# Patient Record
Sex: Male | Born: 1940
Health system: Southern US, Community
[De-identification: ages and names within clinical notes are randomized; demographics above are authoritative.]

## PROBLEM LIST (undated history)

## (undated) DIAGNOSIS — M1990 Unspecified osteoarthritis, unspecified site: Secondary | ICD-10-CM

## (undated) DIAGNOSIS — Z8719 Personal history of other diseases of the digestive system: Secondary | ICD-10-CM

## (undated) DIAGNOSIS — R066 Hiccough: Secondary | ICD-10-CM

## (undated) DIAGNOSIS — N401 Enlarged prostate with lower urinary tract symptoms: Secondary | ICD-10-CM

## (undated) DIAGNOSIS — F1910 Other psychoactive substance abuse, uncomplicated: Secondary | ICD-10-CM

## (undated) DIAGNOSIS — E785 Hyperlipidemia, unspecified: Secondary | ICD-10-CM

## (undated) DIAGNOSIS — C61 Malignant neoplasm of prostate: Secondary | ICD-10-CM

## (undated) DIAGNOSIS — K449 Diaphragmatic hernia without obstruction or gangrene: Secondary | ICD-10-CM

## (undated) DIAGNOSIS — N179 Acute kidney failure, unspecified: Secondary | ICD-10-CM

## (undated) DIAGNOSIS — G3184 Mild cognitive impairment, so stated: Secondary | ICD-10-CM

## (undated) DIAGNOSIS — Z789 Other specified health status: Secondary | ICD-10-CM

## (undated) DIAGNOSIS — I10 Essential (primary) hypertension: Secondary | ICD-10-CM

## (undated) DIAGNOSIS — K222 Esophageal obstruction: Secondary | ICD-10-CM

## (undated) DIAGNOSIS — F1011 Alcohol abuse, in remission: Secondary | ICD-10-CM

## (undated) DIAGNOSIS — R569 Unspecified convulsions: Secondary | ICD-10-CM

## (undated) DIAGNOSIS — I639 Cerebral infarction, unspecified: Secondary | ICD-10-CM

## (undated) DIAGNOSIS — K219 Gastro-esophageal reflux disease without esophagitis: Secondary | ICD-10-CM

## (undated) DIAGNOSIS — I444 Left anterior fascicular block: Secondary | ICD-10-CM

## (undated) DIAGNOSIS — Z8673 Personal history of transient ischemic attack (TIA), and cerebral infarction without residual deficits: Secondary | ICD-10-CM

## (undated) DIAGNOSIS — I723 Aneurysm of iliac artery: Secondary | ICD-10-CM

## (undated) HISTORY — DX: Other psychoactive substance abuse, uncomplicated: F19.10

## (undated) HISTORY — DX: Essential (primary) hypertension: I10

## (undated) HISTORY — DX: Hiccough: R06.6

## (undated) HISTORY — DX: Cerebral infarction, unspecified: I63.9

## (undated) HISTORY — DX: Hyperlipidemia, unspecified: E78.5

---

## 2002-04-05 ENCOUNTER — Emergency Department (HOSPITAL_COMMUNITY): Admission: EM | Admit: 2002-04-05 | Discharge: 2002-04-05 | Payer: Self-pay | Admitting: Emergency Medicine

## 2003-03-18 ENCOUNTER — Emergency Department (HOSPITAL_COMMUNITY): Admission: EM | Admit: 2003-03-18 | Discharge: 2003-03-18 | Payer: Self-pay | Admitting: Emergency Medicine

## 2004-01-26 ENCOUNTER — Emergency Department (HOSPITAL_COMMUNITY): Admission: EM | Admit: 2004-01-26 | Discharge: 2004-01-26 | Payer: Self-pay | Admitting: *Deleted

## 2004-04-02 ENCOUNTER — Emergency Department (HOSPITAL_COMMUNITY): Admission: EM | Admit: 2004-04-02 | Discharge: 2004-04-02 | Payer: Self-pay | Admitting: Emergency Medicine

## 2004-04-25 ENCOUNTER — Emergency Department (HOSPITAL_COMMUNITY): Admission: EM | Admit: 2004-04-25 | Discharge: 2004-04-25 | Payer: Self-pay | Admitting: Emergency Medicine

## 2004-07-13 ENCOUNTER — Emergency Department (HOSPITAL_COMMUNITY): Admission: EM | Admit: 2004-07-13 | Discharge: 2004-07-13 | Payer: Self-pay | Admitting: Emergency Medicine

## 2005-03-30 ENCOUNTER — Emergency Department (HOSPITAL_COMMUNITY): Admission: EM | Admit: 2005-03-30 | Discharge: 2005-03-30 | Payer: Self-pay | Admitting: Emergency Medicine

## 2005-03-31 ENCOUNTER — Inpatient Hospital Stay (HOSPITAL_COMMUNITY): Admission: EM | Admit: 2005-03-31 | Discharge: 2005-04-12 | Payer: Self-pay | Admitting: Emergency Medicine

## 2005-04-06 HISTORY — PX: OTHER SURGICAL HISTORY: SHX169

## 2005-07-07 ENCOUNTER — Emergency Department (HOSPITAL_COMMUNITY): Admission: EM | Admit: 2005-07-07 | Discharge: 2005-07-07 | Payer: Self-pay | Admitting: Emergency Medicine

## 2005-07-30 ENCOUNTER — Emergency Department (HOSPITAL_COMMUNITY): Admission: EM | Admit: 2005-07-30 | Discharge: 2005-07-30 | Payer: Self-pay | Admitting: Emergency Medicine

## 2005-08-06 ENCOUNTER — Ambulatory Visit: Payer: Self-pay | Admitting: Orthopedic Surgery

## 2007-07-21 ENCOUNTER — Emergency Department (HOSPITAL_COMMUNITY): Admission: EM | Admit: 2007-07-21 | Discharge: 2007-07-21 | Payer: Self-pay | Admitting: Emergency Medicine

## 2007-09-09 ENCOUNTER — Emergency Department (HOSPITAL_COMMUNITY): Admission: EM | Admit: 2007-09-09 | Discharge: 2007-09-09 | Payer: Self-pay | Admitting: Emergency Medicine

## 2008-10-09 ENCOUNTER — Emergency Department (HOSPITAL_COMMUNITY): Admission: EM | Admit: 2008-10-09 | Discharge: 2008-10-09 | Payer: Self-pay | Admitting: Emergency Medicine

## 2008-12-10 ENCOUNTER — Emergency Department (HOSPITAL_COMMUNITY): Admission: EM | Admit: 2008-12-10 | Discharge: 2008-12-10 | Payer: Self-pay | Admitting: Emergency Medicine

## 2009-02-03 ENCOUNTER — Emergency Department (HOSPITAL_COMMUNITY): Admission: EM | Admit: 2009-02-03 | Discharge: 2009-02-03 | Payer: Self-pay | Admitting: Emergency Medicine

## 2010-08-12 ENCOUNTER — Emergency Department (HOSPITAL_COMMUNITY): Admission: EM | Admit: 2010-08-12 | Discharge: 2010-08-12 | Payer: Self-pay | Admitting: Emergency Medicine

## 2010-10-09 ENCOUNTER — Encounter: Payer: Self-pay | Admitting: Family Medicine

## 2010-12-26 LAB — CBC
HCT: 40.8 % (ref 39.0–52.0)
Hemoglobin: 14.1 g/dL (ref 13.0–17.0)
MCHC: 34.6 g/dL (ref 30.0–36.0)
MCV: 88.8 fL (ref 78.0–100.0)
Platelets: 208 10*3/uL (ref 150–400)
RBC: 4.59 MIL/uL (ref 4.22–5.81)
RDW: 14.9 % (ref 11.5–15.5)
WBC: 8.3 10*3/uL (ref 4.0–10.5)

## 2010-12-26 LAB — COMPREHENSIVE METABOLIC PANEL
AST: 21 U/L (ref 0–37)
Albumin: 3.5 g/dL (ref 3.5–5.2)
Chloride: 102 mEq/L (ref 96–112)
Creatinine, Ser: 0.9 mg/dL (ref 0.4–1.5)
GFR calc Af Amer: >60 mL/min (ref >60)
Potassium: 3.7 mEq/L (ref 3.5–5.1)
Sodium: 135 mEq/L (ref 135–145)
Total Bilirubin: 0.6 mg/dL (ref 0.3–1.2)

## 2010-12-26 LAB — DIFFERENTIAL
Basophils Absolute: 0 10*3/uL (ref 0.0–0.1)
Eosinophils Relative: 1 % (ref 0–5)
Lymphocytes Relative: 14 % (ref 12–46)
Monocytes Absolute: 0.9 10*3/uL (ref 0.1–1.0)

## 2010-12-26 LAB — POCT CARDIAC MARKERS
Myoglobin, poc: 125 ng/mL (ref 12–200)
Troponin i, poc: <0.05 ng/mL (ref 0.00–0.09)
Troponin i, poc: <0.05 ng/mL (ref 0.00–0.09)

## 2010-12-26 LAB — URINALYSIS, ROUTINE W REFLEX MICROSCOPIC
Bilirubin Urine: NEGATIVE
Glucose, UA: NEGATIVE mg/dL
Ketones, ur: 15 mg/dL — AB
Leukocytes, UA: NEGATIVE
Nitrite: NEGATIVE
Protein, ur: NEGATIVE mg/dL
Specific Gravity, Urine: 1.025 (ref 1.005–1.030)
Urobilinogen, UA: 0.2 mg/dL (ref 0.0–1.0)
pH: 6.5 (ref 5.0–8.0)

## 2010-12-26 LAB — URINE CULTURE

## 2010-12-26 LAB — URINE MICROSCOPIC-ADD ON

## 2011-01-01 LAB — BASIC METABOLIC PANEL
BUN: 14 mg/dL (ref 6–23)
CO2: 25 mEq/L (ref 19–32)
Chloride: 100 mEq/L (ref 96–112)
Glucose, Bld: 94 mg/dL (ref 70–99)
Potassium: 3.9 mEq/L (ref 3.5–5.1)

## 2011-01-01 LAB — CBC
HCT: 47.7 % (ref 39.0–52.0)
MCHC: 32.4 g/dL (ref 30.0–36.0)
MCV: 88.2 fL (ref 78.0–100.0)
Platelets: 199 10*3/uL (ref 150–400)
RDW: 15 % (ref 11.5–15.5)

## 2011-01-29 ENCOUNTER — Emergency Department (HOSPITAL_COMMUNITY): Payer: Medicare Other

## 2011-01-29 ENCOUNTER — Emergency Department (HOSPITAL_COMMUNITY)
Admission: EM | Admit: 2011-01-29 | Discharge: 2011-01-29 | Disposition: A | Discharge status: Home / Self Care | Payer: Medicare Other | Attending: Emergency Medicine | Admitting: Emergency Medicine

## 2011-01-29 DIAGNOSIS — J984 Other disorders of lung: Secondary | ICD-10-CM | POA: Insufficient documentation

## 2011-01-29 DIAGNOSIS — Z9889 Other specified postprocedural states: Secondary | ICD-10-CM | POA: Insufficient documentation

## 2011-01-29 DIAGNOSIS — K7689 Other specified diseases of liver: Secondary | ICD-10-CM | POA: Insufficient documentation

## 2011-01-29 DIAGNOSIS — N400 Enlarged prostate without lower urinary tract symptoms: Secondary | ICD-10-CM | POA: Insufficient documentation

## 2011-01-29 DIAGNOSIS — R109 Unspecified abdominal pain: Secondary | ICD-10-CM | POA: Insufficient documentation

## 2011-01-29 LAB — COMPREHENSIVE METABOLIC PANEL
ALT: 21 U/L (ref 0–53)
Albumin: 3.6 g/dL (ref 3.5–5.2)
Alkaline Phosphatase: 68 U/L (ref 39–117)
BUN: 17 mg/dL (ref 6–23)
Calcium: 9.9 mg/dL (ref 8.4–10.5)
Potassium: 4.5 mEq/L (ref 3.5–5.1)
Sodium: 137 mEq/L (ref 135–145)
Total Protein: 7.5 g/dL (ref 6.0–8.3)

## 2011-01-29 LAB — CBC
MCHC: 33 g/dL (ref 30.0–36.0)
Platelets: 175 10*3/uL (ref 150–400)
RDW: 14.3 % (ref 11.5–15.5)
WBC: 5.7 10*3/uL (ref 4.0–10.5)

## 2011-01-29 LAB — DIFFERENTIAL
Basophils Absolute: 0 10*3/uL (ref 0.0–0.1)
Eosinophils Absolute: 0.2 10*3/uL (ref 0.0–0.7)
Eosinophils Relative: 4 % (ref 0–5)

## 2011-01-29 MED ORDER — IOHEXOL 300 MG/ML  SOLN
100.0000 mL | Freq: Once | INTRAMUSCULAR | Status: AC | PRN
  Administered 2011-01-29: 100 mL via INTRAVENOUS

## 2011-02-02 NOTE — Op Note (Signed)
NAME:  Joshua Mckee, Joshua Mckee               ACCOUNT NO.:  [id number]   MEDICAL RECORD NO.:  [id number]          PATIENT TYPE:  INP   LOCATION:  A318                          FACILITY:  [facility]   PHYSICIAN:  Jerolyn Shin C. Katrinka Blazing, M.D.   DATE OF BIRTH:  10/06/1940   DATE OF PROCEDURE:  04/06/2005  DATE OF DISCHARGE:  04/12/2005                                 OPERATIVE REPORT   PREOPERATIVE DIAGNOSIS:  Small bowel obstruction.   POSTOPERATIVE DIAGNOSIS:  Partial small bowel obstruction due to omental  adhesions.   OPERATION PERFORMED:  Exploratory laparotomy with adhesiolysis.   SURGEON:  Dirk Dress. Katrinka Blazing, M.D.   ANESTHESIA:  General.   DESCRIPTION OF PROCEDURE:  Under general anesthesia, the abdomen was prepped  and draped in a sterile field.  The midline incision was made.  There were  dilated loops of small bowel upon entering the abdomen and a small amount of  serous fluid.  The small bowel was pulled from the abdomen. On closer  evaluation, there was a band of omentum that was tightly adherent across the  mid small bowel without complete obstruction but there was partial  compression of the midgut and the mesentery with some congestion of the  mesentery and the proximal bowel.  This was lysed with Metzenbaum scissors.  The bowel was then inspected from the ligament of Treitz to the ileocecal  valve and there was no other evidence of obstruction.  There was no question  about viability.  There was excellent peristalsis of the bowel.  The bowel  was placed back in the abdominal cavity.  Irrigation was carried out.  The  abdomen was closed with running #1 Prolene on the fascia, 2-0 Monocryl in  the subcutaneous tissue and staples on the skin.  The patient was awakened  from anesthesia uneventfully and transferred to her bed and taken to the  post anesthesia care unit for further monitoring.      Dirk Dress. Katrinka Blazing, M.D.  Electronically Signed     LCS/MEDQ  D:  05/27/2005  T:  05/28/2005  Job:   [id number]

## 2011-02-02 NOTE — Procedures (Signed)
NAME:  Joshua Mckee, Joshua Mckee               ACCOUNT NO.:  [id number]   MEDICAL RECORD NO.:  [id number]          PATIENT TYPE:  EMS   LOCATION:  ED                            FACILITY:  [facility]   PHYSICIAN:  Edward L. Juanetta Gosling, M.D.DATE OF BIRTH:  1940/12/12   DATE OF PROCEDURE:  DATE OF DISCHARGE:  07/13/2004                                EKG INTERPRETATION   RESULTS:  The computer has read this as atrial fibrillation, but there are  clear P-waves.  This appears to be sinus rhythm with some artifact.  There  is left anterior hemi-block.  Otherwise, normal electrocardiogram.     Edwa   ELH/MEDQ  D:  07/14/2004  T:  07/14/2004  Job:  [id number]

## 2011-02-02 NOTE — Procedures (Signed)
NAME:  Joshua Mckee, Joshua Mckee               ACCOUNT NO.:  [id number]   MEDICAL RECORD NO.:  [id number]          PATIENT TYPE:  EMS   LOCATION:  ED                            FACILITY:  [facility]   PHYSICIAN:  Edward L. Juanetta Gosling, M.D.DATE OF BIRTH:  1941-04-28   DATE OF PROCEDURE:  DATE OF DISCHARGE:  07/13/2004                                EKG INTERPRETATION   RESULTS:  The rhythm is sinus rhythm.  The computer has read this as atrial  fibrillation.  This is sinus rhythm with artifact.  There is left anterior  hemi-block.  Otherwise, normal electrocardiogram.     Edwa   ELH/MEDQ  D:  07/14/2004  T:  07/14/2004  Job:  [id number]

## 2011-02-02 NOTE — Discharge Summary (Signed)
NAME:  AZIR, MUZYKA               ACCOUNT NO.:  [id number]   MEDICAL RECORD NO.:  [id number]          PATIENT TYPE:  INP   LOCATION:  A318                          FACILITY:  [facility]   PHYSICIAN:  Jerolyn Shin C. Katrinka Blazing, M.D.   DATE OF BIRTH:  Sep 16, 1941   DATE OF ADMISSION:  03/31/2005  DATE OF DISCHARGE:  07/27/2006LH                                 DISCHARGE SUMMARY   DISCHARGE DIAGNOSIS:  Partial small bowel obstruction secondary to omental  adhesions.   PROCEDURE:  Exploratory laparotomy with adhesiolysis.   DISPOSITION:  The patient discharged home in stable satisfactory condition.   DISCHARGE MEDICATIONS:  1.  Tylox one to two every 4 hours as needed for pain.  2.  Phenergan 25 mg every 4 hours as needed for nausea.   FOLLOW UP:  The patient is scheduled to be seen 3 weeks post discharge.   HISTORY OF PRESENT ILLNESS:  A 70 year old male admitted for evaluation of  recurrent abdominal pain with nausea, vomiting.  The patient had onset of  abdominal pain on July 13.  He was seen in the emergency room with complaint  of upper abdominal pain, nausea, vomiting.  His abdomen was distended, but  his abdominal series only showed some gas in the left upper quadrant with a  mildly dilated proximal small bowel.  He had normal labs with normal white  count.  He was treated symptomatically and discharged from the emergency  room.  He returned to the emergency room on July 15, with similar complaints  stating that he had gotten worse.  He had more nausea and vomiting.  White  count had increased to 15,000.  BUN was 25 and creatinine had increased to  2.  CT scan showed dilated loops of small bowel with decompressed terminal  ileum and colon.  There was no history of any previous surgery.   HOSPITAL COURSE:  The patient was admitted and rehydrated, given antiemetics  and placed on nasogastric decompression.  Abdominal exam was benign except  for mild distension.  The patient was treated  symptomatically and initially  showed some improvement.  He had problems with hiccups which was treated  with Thorazine.  He did not totally decompress.  He was passing gas per  rectum.  A small bowel follow through was done and this showed no contrast  in the colon after 24 hours.  It was therefore decided he would need to have  operative therapy.  He was explored on July 21, and was found to have a  small band of omentum tightly adherent across the mid small bowel without  complete obstruction with partial compression of the mid gut mesentery.  This was lysed and there were no other adhesions in peritoneal cavity.  He  tolerated the procedure well.  He had slow return of his intestinal  activity.  His major concern throughout his hospitalization was difficult  with a nasogastric tube.  By the July 25, he had five  bowel movements.  His abdomen was soft.  His diet was advanced and he  tolerated this well.  He was  subsequently advanced to a regular diet without  difficulty.  He remained stable and was discharged home on July 27, in  satisfactory condition.      Dirk Dress. Katrinka Blazing, M.D.  Electronically Signed     LCS/MEDQ  D:  05/27/2005  T:  05/28/2005  Job:  [id number]

## 2011-02-02 NOTE — H&P (Signed)
NAME:  Joshua Mckee, Joshua Mckee               ACCOUNT NO.:  [id number]   MEDICAL RECORD NO.:  [id number]          PATIENT TYPE:  INP   LOCATION:  A215                          FACILITY:  [facility]   PHYSICIAN:  Dirk Dress. Katrinka Blazing, M.D.   DATE OF BIRTH:  1941-02-08   DATE OF ADMISSION:  03/31/2005  DATE OF DISCHARGE:  LH                                HISTORY & PHYSICAL   This is a 70 year old male admitted for evaluation of recurrent abdominal  pain with nausea and vomiting.  The patient had onset of abdominal pain on  about 13 July.  He was seen in the emergency room with complaint of  abdominal pain, nausea, and vomiting.  His abdomen was mildly distended, but  abdominal series only showed some gas in the left upper quadrant of his  abdomen with a mildly dilated proximal small bowel.  The patient had normal  labs with normal white count.  He was treated symptomatically and  discharged.  He returned to the emergency room on 15 July with similar  complaints stating that the pain had gotten worse.  He had more nausea,  vomiting.  At that time, white count had increased to 15,000.  His BUN had  increased to 25, and his creatinine had increased to 2.  The patient had a  CT scan which showed dilated loops of small bowel with decompressed terminal  ilium and colon.  The patient has not had any  previous surgery.  He is not  having pain at this time but is actually just having difficulty with an NG  tube.  He feels more comfortable.  The patient is admitted and will start  him on IV hydration, antiemetics, Reglan, and continue him on nasogastric  suction with followup abdominal series.   PAST MEDICAL HISTORY:  He has no major medical illnesses.   He takes no chronic medications.   He has no allergies.   There is no history of previous surgery.   REVIEW OF SYSTEMS:  Negative except for the symptoms that he had on 14 and  15 July.   SOCIAL HISTORY:  He is married.  He is employed.  He smokes up to two  packs  of cigarettes per week.  He does not drink or use drugs according to him,  but according to his daughter, he drinks a significant amount of beer.   PHYSICAL EXAMINATION:  VITAL SIGNS:  Blood pressure 116/86, pulse 81,  respirations 20, temperature 97.9.  HEENT:  Unremarkable.  NECK:  Supple.  No JVD, bruit, adenopathy, or thyromegaly.  CHEST:  Clear to auscultation.  No rales, rubs, rhonchi, or wheezes.  HEART:  Regular rate and rhythm without murmur, gallop, or rub.  ABDOMEN:  Soft, nontender without masses.  There is mild distention.  He has  normoactive bowel sounds.  EXTREMITIES:  No cyanosis, clubbing, or edema.  NEUROLOGIC:  No focal motor, sensory, or cerebellar deficits.   IMPRESSION:  Acute abdominal pain with nausea and vomiting, no diarrhea.  Suspected acute gastritis or gastroenteritis.  No history of to suggest  appendicitis or diverticulitis.  No evidence of pancreatitis or biliary  tract disease.   PLAN:  1.  The patient will have IV fluids, IV Zofran and p.r.n. Phenergan.  2.  He will receive Reglan at 10 mg IV q.6h.  3.  He has been started on Ancef 1 g IV q.6h.  4.  Will get abdominal series.  5.  If symptoms do not improve, will get a small-bowel follow-through.  6.  Will also get an upper abdominal ultrasound in the morning.      Dirk Dress. Katrinka Blazing, M.D.  Electronically Signed     LCS/MEDQ  D:  04/01/2005  T:  04/01/2005  Job:  [id number]

## 2011-06-26 LAB — DIFFERENTIAL
Basophils Absolute: 0
Basophils Relative: 0
Lymphocytes Relative: 8 — ABNORMAL LOW
Neutro Abs: 7.3
Neutrophils Relative %: 79 — ABNORMAL HIGH

## 2011-06-26 LAB — COMPREHENSIVE METABOLIC PANEL
Albumin: 4.2
Alkaline Phosphatase: 72
BUN: 28 — ABNORMAL HIGH
CO2: 33 — ABNORMAL HIGH
Chloride: 96
Creatinine, Ser: 1.28
GFR calc non Af Amer: 56 — ABNORMAL LOW
Glucose, Bld: 111 — ABNORMAL HIGH
Potassium: 4.1
Total Bilirubin: 0.8

## 2011-06-26 LAB — CBC
HCT: 48.2
Hemoglobin: 16.1
MCV: 87.1
Platelets: 214
WBC: 9.3

## 2011-06-26 LAB — URINALYSIS, ROUTINE W REFLEX MICROSCOPIC
Leukocytes, UA: NEGATIVE
Nitrite: NEGATIVE
Specific Gravity, Urine: >1.03 — ABNORMAL HIGH
Urobilinogen, UA: 0.2
pH: 5.5

## 2011-06-26 LAB — URINE MICROSCOPIC-ADD ON

## 2012-07-01 ENCOUNTER — Emergency Department (HOSPITAL_COMMUNITY)
Admission: EM | Admit: 2012-07-01 | Discharge: 2012-07-01 | Disposition: A | Discharge status: Home / Self Care | Payer: Medicare Other | Attending: Emergency Medicine | Admitting: Emergency Medicine

## 2012-07-01 ENCOUNTER — Encounter (HOSPITAL_COMMUNITY): Payer: Self-pay | Admitting: *Deleted

## 2012-07-01 ENCOUNTER — Emergency Department (HOSPITAL_COMMUNITY): Payer: Medicare Other

## 2012-07-01 DIAGNOSIS — R066 Hiccough: Secondary | ICD-10-CM | POA: Insufficient documentation

## 2012-07-01 MED ORDER — METOCLOPRAMIDE HCL 10 MG PO TABS: ORAL_TABLET | ORAL | Status: DC

## 2012-07-01 MED ORDER — METOCLOPRAMIDE HCL 10 MG PO TABS
10.0000 mg | ORAL_TABLET | Freq: Once | ORAL | Status: AC
  Administered 2012-07-01: 10 mg via ORAL
  Filled 2012-07-01: qty 1

## 2012-07-01 MED ORDER — CHLORPROMAZINE HCL 25 MG/ML IJ SOLN
50.0000 mg | Freq: Once | INTRAMUSCULAR | Status: AC
  Administered 2012-07-01: 50 mg via INTRAMUSCULAR
  Filled 2012-07-01: qty 2

## 2012-07-01 NOTE — ED Notes (Signed)
Hiccups x2 days 

## 2012-07-01 NOTE — ED Provider Notes (Cosign Needed)
History     CSN: [id number]  Arrival date & time 07/01/12  1818   First MD Initiated Contact with Patient 07/01/12 1930      Chief Complaint  Patient presents with  . Hiccups    (Consider location/radiation/quality/duration/timing/severity/associated sxs/prior treatment) HPI  Patient reports this is the second day he has had hiccups. He states he took Pepto-Bismol yesterday and the hiccups left him  however patient works night shift and after he finished his shift he started having hiccuping again this morning. He states it went away a short while today but have returned. He tried Pepto-Bismol again tonight without success. He denies nausea however he did make himself have vomiting as he thought it might help stop the hiccuping. He denies fever or cough. He states he did have an episode of shortness of breath on the way to the ER but does not have shortness of breath now. He also states he has some soreness over his lower sternum today.  PCP [facility] in [place]  History reviewed. No pertinent past medical history.  Past Surgical History  Procedure Date  . Abdominal surgery     No family history on file.  History  Substance Use Topics  . Smoking status: Never Smoker   . Smokeless tobacco: Not on file  . Alcohol Use: No  Lives at home employed   Review of Systems  All other systems reviewed and are negative.    Allergies  Review of patient's allergies indicates no known allergies.  Home Medications   Current Outpatient Rx  Name Route Sig Dispense Refill  . ALUM & MAG HYDROXIDE-SIMETH 200-200-20 MG/5ML PO SUSP Oral Take 5-15 mLs by mouth daily as needed. For relief    . BISMUTH SUBSALICYLATE 262 MG/15ML PO SUSP Oral Take 15 mLs by mouth daily as needed. For relief    . OMEPRAZOLE 20 MG PO CPDR Oral Take 20 mg by mouth once.      BP 131/96  Pulse 80  Temp 97.3 F (36.3 C) (Oral)  Resp 20  Ht 5\' 5"  (1.651 m)  Wt 169 lb (76.658 kg)  BMI 28.12  kg/m2  SpO2 100%  Vital signs normal    Physical Exam  Nursing note and vitals reviewed. Constitutional: He is oriented to person, place, and time. He appears well-developed and well-nourished.  Non-toxic appearance. He does not appear ill. No distress.       Pt having hiccups regularly  HENT:  Head: Normocephalic and atraumatic.  Right Ear: External ear normal.  Left Ear: External ear normal.  Nose: Nose normal. No mucosal edema or rhinorrhea.  Mouth/Throat: Oropharynx is clear and moist and mucous membranes are normal. No dental abscesses or uvula swelling.  Eyes: Conjunctivae normal and EOM are normal. Pupils are equal, round, and reactive to light.  Neck: Normal range of motion and full passive range of motion without pain. Neck supple.  Cardiovascular: Normal rate, regular rhythm and normal heart sounds.  Exam reveals no gallop and no friction rub.   No murmur heard. Pulmonary/Chest: Effort normal and breath sounds normal. No respiratory distress. He has no wheezes. He has no rhonchi. He has no rales. He exhibits no tenderness and no crepitus.  Abdominal: Soft. Normal appearance and bowel sounds are normal. He exhibits no distension. There is no tenderness. There is no rebound and no guarding.  Musculoskeletal: Normal range of motion. He exhibits no edema and no tenderness.       Moves all extremities well.  Neurological: He is alert and oriented to person, place, and time. He has normal strength. No cranial nerve deficit.  Skin: Skin is warm, dry and intact. No rash noted. No erythema. No pallor.  Psychiatric: He has a normal mood and affect. His speech is normal and behavior is normal. His mood appears not anxious.    ED Course  Procedures (including critical care time)   Medications  metoCLOPramide (REGLAN) 10 MG tablet (not administered)  chlorproMAZINE (THORAZINE) injection 50 mg (50 mg Intramuscular Given 07/01/12 1955)    Pt states his hiccups were better, still has  some    Dg Chest 2 View  07/01/2012  *RADIOLOGY REPORT*  Clinical Data: Persistent hiccups.  CHEST - 2 VIEW  Comparison: 07/13/2004.  Findings: The cardiac silhouette, mediastinal and hilar contours are within normal limits and stable.  The lungs demonstrate mild emphysematous changes.  There are chronic left basilar scarring changes.  No infiltrates, edema or effusions.  The bony thorax is intact.  Stable degenerative changes in the mid thoracic spine.  IMPRESSION:  1.  Emphysematous changes and chronic left basilar scarring. 2.  No acute pulmonary findings.   Original Report Authenticated By: P. Loralie Champagne, M.D.      1. Intractable hiccups    New Prescriptions   METOCLOPRAMIDE (REGLAN) 10 MG TABLET    Take 1 po QID prn hiccups    Plan discharge  Devoria Albe, MD, Armando Gang    MDM          Ward Givens, MD 07/01/12 2104

## 2012-07-21 ENCOUNTER — Emergency Department (HOSPITAL_COMMUNITY)
Admission: EM | Admit: 2012-07-21 | Discharge: 2012-07-22 | Disposition: A | Discharge status: Home / Self Care | Payer: Medicare Other | Attending: Emergency Medicine | Admitting: Emergency Medicine

## 2012-07-21 ENCOUNTER — Encounter (HOSPITAL_COMMUNITY): Payer: Self-pay | Admitting: Emergency Medicine

## 2012-07-21 ENCOUNTER — Emergency Department (HOSPITAL_COMMUNITY): Payer: Medicare Other

## 2012-07-21 DIAGNOSIS — R1110 Vomiting, unspecified: Secondary | ICD-10-CM | POA: Insufficient documentation

## 2012-07-21 DIAGNOSIS — R066 Hiccough: Secondary | ICD-10-CM | POA: Insufficient documentation

## 2012-07-21 LAB — CBC WITH DIFFERENTIAL/PLATELET
Basophils Relative: 0 % (ref 0–1)
HCT: 41 % (ref 39.0–52.0)
Hemoglobin: 14 g/dL (ref 13.0–17.0)
Lymphs Abs: 1.7 10*3/uL (ref 0.7–4.0)
MCH: 30.3 pg (ref 26.0–34.0)
MCHC: 34.1 g/dL (ref 30.0–36.0)
Monocytes Absolute: 0.8 10*3/uL (ref 0.1–1.0)
Monocytes Relative: 9 % (ref 3–12)
Neutro Abs: 5.4 10*3/uL (ref 1.7–7.7)
RBC: 4.62 MIL/uL (ref 4.22–5.81)

## 2012-07-21 LAB — BASIC METABOLIC PANEL
BUN: 22 mg/dL (ref 6–23)
Chloride: 99 mEq/L (ref 96–112)
Creatinine, Ser: 0.91 mg/dL (ref 0.50–1.35)
GFR calc Af Amer: >90 mL/min (ref >90)
Glucose, Bld: 104 mg/dL — ABNORMAL HIGH (ref 70–99)
Potassium: 4.1 mEq/L (ref 3.5–5.1)

## 2012-07-21 NOTE — ED Provider Notes (Signed)
History   This chart was scribed for Shelda Jakes, MD, by Frederik Pear. The patient was seen in room APA12/APA12 and the patient's care was started at 2158.    CSN: [id number]  Arrival date & time 07/21/12  2022   First MD Initiated Contact with Patient 07/21/12 2158      Chief Complaint  Patient presents with  . Emesis    (Consider location/radiation/quality/duration/timing/severity/associated sxs/prior treatment) HPI Comments: Joshua Mckee is a 71 y.o. male with a h/o of hiccups who presents to the Emergency Department complaining of moderate, intermittent emesis with associated hiccups that began earlier this evening.  He reports that he has had a total of 2-3 episodes that were dark in color, but no blood was present. He denies any associated abdominal pain, back pain, diarrhea, congestion, SOB, chest pain, dysuria, rash,  or fever. He reported that his earlier BM was also not black in color. He took reglan once today, but did not notice any relief.   No PCP.     History reviewed. No pertinent past medical history.  Past Surgical History  Procedure Date  . Abdominal surgery     History reviewed. No pertinent family history.  History  Substance Use Topics  . Smoking status: Never Smoker   . Smokeless tobacco: Not on file  . Alcohol Use: Yes     Comment: "sometimes"      Review of Systems  Constitutional: Negative for fever.  HENT: Negative for congestion.        Hiccups.  Respiratory: Negative for shortness of breath.   Cardiovascular: Negative for chest pain.  Gastrointestinal: Positive for vomiting. Negative for abdominal pain, diarrhea and blood in stool.  Genitourinary: Negative for dysuria.  Musculoskeletal: Negative for back pain.  Skin: Negative for rash.  All other systems reviewed and are negative.    Allergies  Review of patient's allergies indicates no known allergies.  Home Medications   Current Outpatient Rx  Name  Route  Sig   Dispense  Refill  . ALUM & MAG HYDROXIDE-SIMETH 200-200-20 MG/5ML PO SUSP   Oral   Take 5-15 mLs by mouth daily as needed. For relief         . METOCLOPRAMIDE HCL 10 MG PO TABS   Oral   Take 10 mg by mouth 4 (four) times daily. Take 1 po QID prn hiccups         . ONDANSETRON 4 MG PO TBDP   Oral   Take 1 tablet (4 mg total) by mouth every 8 (eight) hours as needed for nausea.   12 tablet   0     BP 133/102  Pulse 77  Temp 98.3 F (36.8 C) (Oral)  Resp 20  Ht 5\' 6"  (1.676 m)  Wt 169 lb (76.658 kg)  BMI 27.28 kg/m2  SpO2 96%  Physical Exam  Nursing note and vitals reviewed. Constitutional: He is oriented to person, place, and time. He appears well-developed and well-nourished.  HENT:  Mouth/Throat: Oropharynx is clear and moist.  Eyes: Conjunctivae normal and EOM are normal. Pupils are equal, round, and reactive to light. Right eye exhibits no discharge. Left eye exhibits no discharge. No scleral icterus.  Neck: Normal range of motion.  Cardiovascular: Normal rate, regular rhythm and normal heart sounds.   No murmur heard. Pulmonary/Chest: Effort normal and breath sounds normal. No respiratory distress. He has no wheezes. He has no rales. He exhibits no tenderness.  Abdominal: Soft. Bowel sounds are normal. He exhibits  no distension and no mass. There is no tenderness. There is no rebound and no guarding.  Musculoskeletal: Normal range of motion. He exhibits no edema.  Neurological: He is alert and oriented to person, place, and time. No cranial nerve deficit. He exhibits normal muscle tone. Coordination normal.  Skin: No rash noted. No erythema.    ED Course  Procedures (including critical care time)  DIAGNOSTIC STUDIES: Oxygen Saturation is 96% on room air, normal by my interpretation.    COORDINATION OF CARE:  22: 05- Discussed planned course of treatment with the patient, including a chest X-ray and blood work, who is agreeable at this time.  Results for  orders placed during the hospital encounter of 07/21/12  CBC WITH DIFFERENTIAL      Component Value Range   WBC 8.2  4.0 - 10.5 K/uL   RBC 4.62  4.22 - 5.81 MIL/uL   Hemoglobin 14.0  13.0 - 17.0 g/dL   HCT 16.1  09.6 - 04.5 %   MCV 88.7  78.0 - 100.0 fL   MCH 30.3  26.0 - 34.0 pg   MCHC 34.1  30.0 - 36.0 g/dL   RDW 40.9  81.1 - 91.4 %   Platelets 194  150 - 400 K/uL   Neutrophils Relative 67  43 - 77 %   Neutro Abs 5.4  1.7 - 7.7 K/uL   Lymphocytes Relative 21  12 - 46 %   Lymphs Abs 1.7  0.7 - 4.0 K/uL   Monocytes Relative 9  3 - 12 %   Monocytes Absolute 0.8  0.1 - 1.0 K/uL   Eosinophils Relative 3  0 - 5 %   Eosinophils Absolute 0.2  0.0 - 0.7 K/uL   Basophils Relative 0  0 - 1 %   Basophils Absolute 0.0  0.0 - 0.1 K/uL  BASIC METABOLIC PANEL      Component Value Range   Sodium 134 (*) 135 - 145 mEq/L   Potassium 4.1  3.5 - 5.1 mEq/L   Chloride 99  96 - 112 mEq/L   CO2 27  19 - 32 mEq/L   Glucose, Bld 104 (*) 70 - 99 mg/dL   BUN 22  6 - 23 mg/dL   Creatinine, Ser 7.82  0.50 - 1.35 mg/dL   Calcium 8.5  8.4 - 95.6 mg/dL   GFR calc non Af Amer 82 (*) >90 mL/min   GFR calc Af Amer >90  >90 mL/min  LIPASE, BLOOD      Component Value Range   Lipase 48  11 - 59 U/L  HEPATIC FUNCTION PANEL      Component Value Range   Total Protein 6.8  6.0 - 8.3 g/dL   Albumin 3.4 (*) 3.5 - 5.2 g/dL   AST 24  0 - 37 U/L   ALT 22  0 - 53 U/L   Alkaline Phosphatase 64  39 - 117 U/L   Total Bilirubin 0.4  0.3 - 1.2 mg/dL   Bilirubin, Direct PENDING  0.0 - 0.3 mg/dL   Indirect Bilirubin PENDING  0.3 - 0.9 mg/dL     Labs Reviewed  BASIC METABOLIC PANEL - Abnormal; Notable for the following:    Sodium 134 (*)     Glucose, Bld 104 (*)     GFR calc non Af Amer 82 (*)     All other components within normal limits  HEPATIC FUNCTION PANEL - Abnormal; Notable for the following:    Albumin 3.4 (*)  All other components within normal limits  CBC WITH DIFFERENTIAL  LIPASE, BLOOD   Dg  Chest 2 View  07/21/2012  *RADIOLOGY REPORT*  Clinical Data: Hiccups and vomiting.  CHEST - 2 VIEW  Comparison: Chest radiograph performed 07/01/2012  Findings: The lungs are well-aerated and clear.  There is no evidence of focal opacification, pleural effusion or pneumothorax.  The heart is normal in size; the mediastinal contour is within normal limits.  No acute osseous abnormalities are seen.  IMPRESSION: No acute cardiopulmonary process seen.   Original Report Authenticated By: Tonia Ghent, M.D.      1. Vomiting   2. Hiccups       MDM  Christella Hartigan improved in the emergency department. Patient has Reglan that he can take at home for this. Workup for the vomiting and the being dark in color no evidence of any GI bleed or hematemesis. Labs including liver function test are are normal. Lipase is normal no belly pain hemoglobin and hematocrit is very stable platelets are stable. Patient with any further vomiting in the emergency department. Patient stable to go home. No tachycardia or pressure systolic is normal diastolic flow elevated repeats were normal.    I personally performed the services described in this documentation, which was scribed in my presence. The recorded information has been reviewed and considered.         Shelda Jakes, MD 07/22/12 [phone]

## 2012-07-21 NOTE — ED Notes (Signed)
Patient complaining of vomiting x 4-5 times starting this evening. Girlfriend stated it looked "black." Denies abdominal pain.

## 2012-07-21 NOTE — ED Notes (Signed)
Pt with vomiting today, states that it has been multiple times and black in color, denies diarrhea, per pt LBM was 3-4 days ago which is normal per pt., admits to taking some Pepto bismul today

## 2012-07-22 LAB — HEPATIC FUNCTION PANEL
ALT: 22 U/L (ref 0–53)
AST: 24 U/L (ref 0–37)
Albumin: 3.4 g/dL — ABNORMAL LOW (ref 3.5–5.2)
Alkaline Phosphatase: 64 U/L (ref 39–117)
Bilirubin, Direct: <0.1 mg/dL (ref 0.0–0.3)
Total Bilirubin: 0.4 mg/dL (ref 0.3–1.2)

## 2012-07-22 MED ORDER — ONDANSETRON 4 MG PO TBDP: 4.0000 mg | ORAL_TABLET | Freq: Three times a day (TID) | ORAL | Status: DC | PRN

## 2012-07-22 MED ORDER — ONDANSETRON 4 MG PO TBDP
4.0000 mg | ORAL_TABLET | Freq: Once | ORAL | Status: AC
  Administered 2012-07-22: 4 mg via ORAL
  Filled 2012-07-22: qty 1

## 2012-07-25 ENCOUNTER — Encounter (HOSPITAL_COMMUNITY): Payer: Self-pay | Admitting: *Deleted

## 2012-07-25 ENCOUNTER — Emergency Department (HOSPITAL_COMMUNITY)
Admission: EM | Admit: 2012-07-25 | Discharge: 2012-07-25 | Disposition: A | Discharge status: Home / Self Care | Payer: Medicare Other | Attending: Emergency Medicine | Admitting: Emergency Medicine

## 2012-07-25 ENCOUNTER — Emergency Department (HOSPITAL_COMMUNITY): Payer: Medicare Other

## 2012-07-25 DIAGNOSIS — K920 Hematemesis: Secondary | ICD-10-CM | POA: Insufficient documentation

## 2012-07-25 DIAGNOSIS — R066 Hiccough: Secondary | ICD-10-CM | POA: Insufficient documentation

## 2012-07-25 DIAGNOSIS — K5900 Constipation, unspecified: Secondary | ICD-10-CM | POA: Insufficient documentation

## 2012-07-25 DIAGNOSIS — R1310 Dysphagia, unspecified: Secondary | ICD-10-CM | POA: Insufficient documentation

## 2012-07-25 DIAGNOSIS — R1110 Vomiting, unspecified: Secondary | ICD-10-CM

## 2012-07-25 MED ORDER — CHLORPROMAZINE HCL 25 MG PO TABS: 25.0000 mg | ORAL_TABLET | Freq: Three times a day (TID) | ORAL | Status: DC

## 2012-07-25 MED ORDER — ALUM & MAG HYDROXIDE-SIMETH 200-200-20 MG/5ML PO SUSP
30.0000 mL | Freq: Once | ORAL | Status: AC
  Administered 2012-07-25: 30 mL via ORAL
  Filled 2012-07-25: qty 30

## 2012-07-25 MED ORDER — CHLORPROMAZINE HCL 25 MG/ML IJ SOLN
25.0000 mg | Freq: Once | INTRAMUSCULAR | Status: AC
Start: 2012-07-25 — End: 2012-07-25
  Administered 2012-07-25: 25 mg via INTRAMUSCULAR
  Filled 2012-07-25: qty 2

## 2012-07-25 MED ORDER — CALCIUM CARBONATE ANTACID 500 MG PO CHEW: 1.0000 | CHEWABLE_TABLET | Freq: Every day | ORAL | Status: DC

## 2012-07-25 NOTE — ED Notes (Signed)
Pt continues to have hiccups and emesis of black substance per pt.

## 2012-07-25 NOTE — ED Notes (Signed)
Patient with no complaints at this time. Respirations even and unlabored. Skin warm/dry. Discharge instructions reviewed with patient at this time. Patient given opportunity to voice concerns/ask questions. Patient discharged at this time and left Emergency Department with steady gait.   

## 2012-07-25 NOTE — ED Provider Notes (Signed)
History    This chart was scribed for Jones Skene, MD, MD by Smitty Pluck, ED Scribe. The patient was seen in room APA03 and the patient's care was started at 1:14PM.   CSN: [id number]  Arrival date & time 07/25/12  1125      Chief Complaint  Patient presents with  . Hiccups  . Emesis    (Consider location/radiation/quality/duration/timing/severity/associated sxs/prior treatment) The history is provided by the patient. No language interpreter was used.   Joshua Mckee is a 71 y.o. male who presents to the Emergency Department complaining of constant, moderate emesis and intermittent hiccups onset 4 days ago. Pt reports that this is his 3rd visit to ED for similar symptoms. He states that he has had constipation for 1 week and has not been able to eat without vomiting. He reports hx of hiccups and he reports in the past he had injection of medicine in his buttocks that relieved the hiccups. He has vomited constantly even after taking prescribed anti-nausea medicine. He states the vomit has dark flecks today. Patient is passing gas. Denies trouble swallowing, nosebleeds, chest pain, SOB, headache, rash, diaphoresis, blurred vision, cough, hx of stomach ulcers, DM, HTN and any other medical complications. Pt reports that he smoked for 1 year a long time ago but has stopped since. Pt has hx of abdominal surgery.   History reviewed. No pertinent past medical history.  Past Surgical History  Procedure Date  . Abdominal surgery     History reviewed. No pertinent family history.  History  Substance Use Topics  . Smoking status: Never Smoker   . Smokeless tobacco: Not on file  . Alcohol Use: Yes     Comment: "sometimes"      Review of Systems  At least 10pt or greater review of systems completed and are negative except where specified in the HPI.  Allergies  Review of patient's allergies indicates no known allergies.  Home Medications   Current Outpatient Rx  Name  Route   Sig  Dispense  Refill  . METOCLOPRAMIDE HCL 10 MG PO TABS   Oral   Take 10 mg by mouth 4 (four) times daily. Take 1 po QID prn hiccups         . ONDANSETRON 4 MG PO TBDP   Oral   Take 1 tablet (4 mg total) by mouth every 8 (eight) hours as needed for nausea.   12 tablet   0     BP 148/96  Pulse 95  Temp 97.4 F (36.3 C) (Oral)  Resp 18  Ht 5\' 5"  (1.651 m)  Wt 169 lb (76.658 kg)  BMI 28.12 kg/m2  SpO2 98%  Physical Exam  Nursing notes reviewed.  Electronic medical record reviewed. VITAL SIGNS:   Filed Vitals:   07/25/12 1135  BP: 148/96  Pulse: 95  Temp: 97.4 F (36.3 C)  TempSrc: Oral  Resp: 18  Height: 5\' 5"  (1.651 m)  Weight: 169 lb (76.658 kg)  SpO2: 98%   CONSTITUTIONAL: Awake, oriented, appears non-toxic HENT: Atraumatic, normocephalic, oral mucosa pink and moist, airway patent. Nares patent without drainage. External ears normal. EYES: Conjunctiva clear, EOMI, PERRLA NECK: Trachea midline, non-tender, supple CARDIOVASCULAR: Normal heart rate, Normal rhythm, No murmurs, rubs, gallops PULMONARY/CHEST: Clear to auscultation, no rhonchi, wheezes, or rales. Symmetrical breath sounds. Non-tender. ABDOMINAL: Non-distended, soft, non-tender - no rebound or guarding.  Well-healed laparotomy scar.  BS normal. NEUROLOGIC: Non-focal, moving all four extremities, no gross sensory or motor deficits. EXTREMITIES: No clubbing,  cyanosis, or edema SKIN: Warm, Dry, No erythema, No rash  ED Course  Procedures (including critical care time) DIAGNOSTIC STUDIES: Oxygen Saturation is 98% on room air, normal by my interpretation.    COORDINATION OF CARE: 1:25 PM Discussed ED treatment with pt  1:26 PM Ordered:     . [COMPLETED] chlorproMAZINE  25 mg Intramuscular Once   2:18PM Recheck: Pt reports that hiccups has subsided and he feels better.     Labs Reviewed  OCCULT BLOOD GASTRIC / DUODENUM - Abnormal; Notable for the following:    Occult Blood, Gastric POSITIVE  (*)     All other components within normal limits  LAB REPORT - SCANNED   Dg Neck Soft Tissue  07/25/2012  *RADIOLOGY REPORT*  Clinical Data: Hiccups, vomiting.  NECK SOFT TISSUES - 1+ VIEW  Comparison: 07/13/2004  Findings: Epiglottis and aryepiglottic folds are normal.  Airway is patent.  No radiopaque foreign body.  Degenerative changes in the cervical spine.  Retropharyngeal soft tissues are normal.  IMPRESSION: No acute findings.  Cervical spondylosis.   Original Report Authenticated By: Charlett Nose, M.D.    Dg Chest 2 View  07/25/2012  *RADIOLOGY REPORT*  Clinical Data: Hiccups.  Emesis.  CHEST - 2 VIEW  Comparison: 07/21/2012 and earlier  Findings: Cardiomediastinal silhouette is within normal limits.  No evidence for mediastinal mass or adenopathy.  The lungs are free of focal consolidations and pleural effusions.  Degenerative changes are seen in the spine.  IMPRESSION: No evidence for acute cardiopulmonary abnormality.   Original Report Authenticated By: Norva Pavlov, M.D.      1. Intractable hiccoughs   2. Vomiting       MDM  Joshua Mckee is a 71 y.o. male presents with hiccups for the third time. Pt has been nauesous and unable to eat.  Denies EtOH or illicit grugs.  Recurrent vomiting has likely led to minor bleeding evidenced by +Gastrocult.  No food bolus seen on XR or described by history.  Pt needs outpatient follow up - no emergent cause of hiccups found today such as: PTX, neoplasm evident on CXR.  Pt has no chest pain or abdominal pain making AAA or ACS unlikely. Treated with thorazine IM and pt stopped hiccupping.  Follow up with PCP for further work-up.  I explained the diagnosis and have given explicit precautions to return to the ER including chest pain, dyspnea or any other new or worsening symptoms. The patient understands and accepts the medical plan as it's been dictated and I have answered their questions. Discharge instructions concerning home care and  prescriptions have been given.  The patient is STABLE and is discharged to home in good condition.  I personally performed the services described in this documentation, which was scribed in my presence. The recorded information has been reviewed and is accurate.   Jones Skene, MD 07/26/12 2203

## 2012-07-25 NOTE — ED Notes (Signed)
Patient transported to X-ray 

## 2012-08-23 ENCOUNTER — Emergency Department (HOSPITAL_COMMUNITY)
Admission: EM | Admit: 2012-08-23 | Discharge: 2012-08-23 | Disposition: A | Discharge status: Home / Self Care | Payer: Medicare Other | Attending: Emergency Medicine | Admitting: Emergency Medicine

## 2012-08-23 ENCOUNTER — Emergency Department (HOSPITAL_COMMUNITY): Payer: Medicare Other

## 2012-08-23 ENCOUNTER — Encounter (HOSPITAL_COMMUNITY): Payer: Self-pay | Admitting: Emergency Medicine

## 2012-08-23 DIAGNOSIS — H538 Other visual disturbances: Secondary | ICD-10-CM | POA: Insufficient documentation

## 2012-08-23 DIAGNOSIS — H539 Unspecified visual disturbance: Secondary | ICD-10-CM

## 2012-08-23 DIAGNOSIS — Z9889 Other specified postprocedural states: Secondary | ICD-10-CM | POA: Insufficient documentation

## 2012-08-23 DIAGNOSIS — R059 Cough, unspecified: Secondary | ICD-10-CM | POA: Insufficient documentation

## 2012-08-23 DIAGNOSIS — R05 Cough: Secondary | ICD-10-CM | POA: Insufficient documentation

## 2012-08-23 DIAGNOSIS — R1900 Intra-abdominal and pelvic swelling, mass and lump, unspecified site: Secondary | ICD-10-CM | POA: Insufficient documentation

## 2012-08-23 DIAGNOSIS — R140 Abdominal distension (gaseous): Secondary | ICD-10-CM

## 2012-08-23 DIAGNOSIS — R209 Unspecified disturbances of skin sensation: Secondary | ICD-10-CM | POA: Insufficient documentation

## 2012-08-23 LAB — URINALYSIS, ROUTINE W REFLEX MICROSCOPIC
Bilirubin Urine: NEGATIVE
Glucose, UA: NEGATIVE mg/dL
Ketones, ur: NEGATIVE mg/dL
Nitrite: NEGATIVE
Specific Gravity, Urine: >1.03 — ABNORMAL HIGH (ref 1.005–1.030)
pH: 5.5 (ref 5.0–8.0)

## 2012-08-23 LAB — APTT: aPTT: 28 seconds (ref 24–37)

## 2012-08-23 LAB — COMPREHENSIVE METABOLIC PANEL
ALT: 13 U/L (ref 0–53)
Albumin: 3.6 g/dL (ref 3.5–5.2)
Alkaline Phosphatase: 65 U/L (ref 39–117)
BUN: 14 mg/dL (ref 6–23)
Chloride: 99 mEq/L (ref 96–112)
Glucose, Bld: 86 mg/dL (ref 70–99)
Potassium: 3.6 mEq/L (ref 3.5–5.1)
Sodium: 135 mEq/L (ref 135–145)
Total Bilirubin: 0.4 mg/dL (ref 0.3–1.2)
Total Protein: 7.3 g/dL (ref 6.0–8.3)

## 2012-08-23 LAB — CBC WITH DIFFERENTIAL/PLATELET
Basophils Relative: 0 % (ref 0–1)
Eosinophils Absolute: 0.2 10*3/uL (ref 0.0–0.7)
Hemoglobin: 15.3 g/dL (ref 13.0–17.0)
Lymphs Abs: 1.6 10*3/uL (ref 0.7–4.0)
MCH: 31.1 pg (ref 26.0–34.0)
Monocytes Relative: 9 % (ref 3–12)
Neutro Abs: 7.7 10*3/uL (ref 1.7–7.7)
Neutrophils Relative %: 74 % (ref 43–77)
Platelets: 175 10*3/uL (ref 150–400)
RBC: 4.92 MIL/uL (ref 4.22–5.81)

## 2012-08-23 LAB — PROTIME-INR
INR: 1.06 (ref 0.00–1.49)
Prothrombin Time: 13.7 seconds (ref 11.6–15.2)

## 2012-08-23 NOTE — ED Notes (Signed)
Patient with c/o abdominal swelling and soreness. Noticed problem today.

## 2012-08-23 NOTE — ED Provider Notes (Signed)
History  This chart was scribed for Ward Givens, MD by Erskine Emery, ED Scribe. This patient was seen in room APA12/APA12 and the patient's care was started at 15:42.   CSN: [id number]  Arrival date & time 08/23/12  1539   First MD Initiated Contact with Patient 08/23/12 1542      Chief Complaint  Patient presents with  . Abdominal Pain    (Consider location/radiation/quality/duration/timing/severity/associated sxs/prior treatment) The history is provided by the patient. No language interpreter was used.  Joshua Mckee is a 71 y.o. male who presents to the Emergency Department complaining of abdominal distension and soreness and arm numbness and tingling since this morning. Pt denies any significant abdominal pain, emesis, nausea, difficulty urinating, dysuria, fever, loss of appetite, or change in bowel movements but he has had a mild cough. Pt has a h/o abdominal surgery, done by Dr. Katrinka Blazing, several years ago after an episode of black emesis. Pt also reports yesterday he had a one minute episode of visual disturbance, like little shining discs in his visual field. He had no headache or any numbness, tingling, or weakness in the extremities with that episode. He states today his left arm feels heavy or like it is asleep.  Pt was seen here about a month ago for emesis and hiccups. Pt is not on any routine medications.  Pt has no PCP.  History reviewed. No pertinent past medical history.  Past Surgical History  Procedure Date  . Abdominal surgery     No family history on file.  Pt denies strokes run in his family, but his little brother died of a massive heart attack.  History  Substance Use Topics  . Smoking status: Never Smoker   . Smokeless tobacco: Not on file  . Alcohol Use: Yes     Comment: "sometimes"  Lives at home employed   Review of Systems  Constitutional: Negative for fever and appetite change.  Respiratory: Positive for cough.   Gastrointestinal: Positive for  abdominal distention. Negative for nausea and vomiting.  Genitourinary: Negative for dysuria and difficulty urinating.  Neurological: Positive for numbness. Negative for weakness.  All other systems reviewed and are negative.    Allergies  Review of patient's allergies indicates no known allergies.  Home Medications   No current outpatient prescriptions on file.  BP 130/97  Pulse 90  Temp 97.5 F (36.4 C) (Oral)  Resp 18  Ht 5\' 5"  (1.651 m)  Wt 169 lb (76.658 kg)  BMI 28.12 kg/m2  SpO2 100%  Vital signs normal    Physical Exam  Nursing note and vitals reviewed. Constitutional: He is oriented to person, place, and time. He appears well-developed and well-nourished.  Non-toxic appearance. He does not appear ill. No distress.  HENT:  Head: Normocephalic and atraumatic.  Right Ear: External ear normal.  Left Ear: External ear normal.  Nose: Nose normal. No mucosal edema or rhinorrhea.  Mouth/Throat: Oropharynx is clear and moist and mucous membranes are normal. No dental abscesses or uvula swelling.  Eyes: Conjunctivae normal and EOM are normal. Pupils are equal, round, and reactive to light.       No gross visual field cuts.  Neck: Normal range of motion and full passive range of motion without pain. Neck supple.       No bruits in the neck.  Cardiovascular: Normal rate, regular rhythm and normal heart sounds.  Exam reveals no gallop and no friction rub.   No murmur heard. Pulmonary/Chest: Effort normal and breath  sounds normal. No respiratory distress. He has no wheezes. He has no rhonchi. He has no rales. He exhibits no tenderness and no crepitus.  Abdominal: Soft. Normal appearance and bowel sounds are normal. He exhibits no distension. There is no tenderness. There is no rebound and no guarding.  Musculoskeletal: Normal range of motion. He exhibits no edema and no tenderness.       Moves all extremities well.   Neurological: He is alert and oriented to person, place,  and time. He has normal strength. No cranial nerve deficit.       FTF normal  Skin: Skin is warm, dry and intact. No rash noted. No erythema. No pallor.  Psychiatric: He has a normal mood and affect. His speech is normal and behavior is normal. His mood appears not anxious.    ED Course  Procedures (including critical care time) DIAGNOSTIC STUDIES: Oxygen Saturation is 100% on room air, normal by my interpretation.    COORDINATION OF CARE: 15:55--I evaluated the patient and we discussed a treatment plan including abdominal x-ray, CT scan, blood work and medication to which the pt agreed.   Bladder scan was "0" after urinating  VA was 20/25 OD, OS and OU  17:30 patient reports he just came out of the bathroom and had "a blowout" of a moderate amount of semisolid stool. He states his abdomen feels much improved. He states his arm feels improved and he is back to his baseline. We discussed taking an ASA a day.  We discussed that he needs to get a primary care doctor and will  give him phone numbers for [facility]   Results for orders placed during the hospital encounter of 08/23/12  URINALYSIS, ROUTINE W REFLEX MICROSCOPIC      Component Value Range   Color, Urine YELLOW  YELLOW   APPearance CLEAR  CLEAR   Specific Gravity, Urine >1.030 (*) 1.005 - 1.030   pH 5.5  5.0 - 8.0   Glucose, UA NEGATIVE  NEGATIVE mg/dL   Hgb urine dipstick SMALL (*) NEGATIVE   Bilirubin Urine NEGATIVE  NEGATIVE   Ketones, ur NEGATIVE  NEGATIVE mg/dL   Protein, ur NEGATIVE  NEGATIVE mg/dL   Urobilinogen, UA 0.2  0.0 - 1.0 mg/dL   Nitrite NEGATIVE  NEGATIVE   Leukocytes, UA NEGATIVE  NEGATIVE  TROPONIN I      Component Value Range   Troponin I <0.30  <0.30 ng/mL  APTT      Component Value Range   aPTT 28  24 - 37 seconds  PROTIME-INR      Component Value Range   Prothrombin Time 13.7  11.6 - 15.2 seconds   INR 1.06  0.00 - 1.49  CBC WITH  DIFFERENTIAL      Component Value Range   WBC 10.4  4.0 - 10.5 K/uL   RBC 4.92  4.22 - 5.81 MIL/uL   Hemoglobin 15.3  13.0 - 17.0 g/dL   HCT 11.9  14.7 - 82.9 %   MCV 89.4  78.0 - 100.0 fL   MCH 31.1  26.0 - 34.0 pg   MCHC 34.8  30.0 - 36.0 g/dL   RDW 56.2  13.0 - 86.5 %   Platelets 175  150 - 400 K/uL   Neutrophils Relative 74  43 - 77 %   Neutro Abs 7.7  1.7 - 7.7 K/uL   Lymphocytes Relative 15  12 - 46 %   Lymphs Abs  1.6  0.7 - 4.0 K/uL   Monocytes Relative 9  3 - 12 %   Monocytes Absolute 0.9  0.1 - 1.0 K/uL   Eosinophils Relative 2  0 - 5 %   Eosinophils Absolute 0.2  0.0 - 0.7 K/uL   Basophils Relative 0  0 - 1 %   Basophils Absolute 0.0  0.0 - 0.1 K/uL  COMPREHENSIVE METABOLIC PANEL      Component Value Range   Sodium 135  135 - 145 mEq/L   Potassium 3.6  3.5 - 5.1 mEq/L   Chloride 99  96 - 112 mEq/L   CO2 29  19 - 32 mEq/L   Glucose, Bld 86  70 - 99 mg/dL   BUN 14  6 - 23 mg/dL   Creatinine, Ser 1.61  0.50 - 1.35 mg/dL   Calcium 9.2  8.4 - 09.6 mg/dL   Total Protein 7.3  6.0 - 8.3 g/dL   Albumin 3.6  3.5 - 5.2 g/dL   AST 18  0 - 37 U/L   ALT 13  0 - 53 U/L   Alkaline Phosphatase 65  39 - 117 U/L   Total Bilirubin 0.4  0.3 - 1.2 mg/dL   GFR calc non Af Amer 73 (*) >90 mL/min   GFR calc Af Amer 84 (*) >90 mL/min  URINE MICROSCOPIC-ADD ON      Component Value Range   WBC, UA 0-2  <3 WBC/hpf   RBC / HPF 0-2  <3 RBC/hpf    Laboratory interpretation all normal   Ct Head Wo Contrast  08/23/2012  *RADIOLOGY REPORT*  Clinical Data: Visual loss.  Numbness in the left arm.  CT HEAD WITHOUT CONTRAST  Technique:  Contiguous axial images were obtained from the base of the skull through the vertex without contrast.  Comparison: None.  Findings:   No acute cortical infarct, hemorrhage, mass lesion is present.  Mild generalized atrophy is present.  Ventricular dilation is proportionate to the degree of atrophy.  No significant extra-axial fluid collection is present.  The  paranasal sinuses and mastoid air cells are clear.  The osseous skull is intact.  IMPRESSION:  1.  Mild generalized atrophy. 2.  No acute intracranial abnormality.   Original Report Authenticated By: Marin Roberts, M.D.    Dg Abd 2 Views  08/23/2012  *RADIOLOGY REPORT*  Clinical Data: Abdominal pain and swelling.  ABDOMEN - 2 VIEW  Comparison: CT abdomen and pelvis 01/29/2011.  Findings: Fluid levels are present within loops of large and small bowel throughout the abdomen.  There is no evidence for obstruction or free air.  Moderate degenerative changes are noted in the lumbar spine.  The SI joints are fused. The lung bases are clear.  IMPRESSION:  1.  Fluid levels within nondilated loops of bowel are most compatible with an adynamic ileus. 2.  Degenerative changes in the lumbar spine.   Original Report Authenticated By: Marin Roberts, M.D.     Dg Neck Soft Tissue  07/25/2012  *RADIOLOGY REPORT*  Clinical Data: Hiccups, vomiting.  NECK SOFT TISSUES - 1+ VIEW  Comparison: 07/13/2004  Findings: Epiglottis and aryepiglottic folds are normal.  Airway is patent.  No radiopaque foreign body.  Degenerative changes in the cervical spine.  Retropharyngeal soft tissues are normal.  IMPRESSION: No acute findings.  Cervical spondylosis.   Original Report Authenticated By: Charlett Nose, M.D.    Dg Chest 2 View  07/25/2012  *RADIOLOGY REPORT*  Clinical Data: Hiccups.  Emesis.  CHEST - 2 VIEW  Comparison: 07/21/2012 and earlier  Findings: Cardiomediastinal silhouette is within normal limits.  No evidence for mediastinal mass or adenopathy.  The lungs are free of focal consolidations and pleural effusions.  Degenerative changes are seen in the spine.  IMPRESSION: No evidence for acute cardiopulmonary abnormality.   Original Report Authenticated By: Norva Pavlov, M.D.       Date: 08/23/2012  Rate: 82  Rhythm: normal sinus rhythm  QRS Axis: right  Intervals: normal  ST/T Wave abnormalities: normal   Conduction Disutrbances:LVH  Narrative Interpretation:   Old EKG Reviewed: unchanged from 02/03/2009      1. Abdominal distension   2. Visual disturbance    Plan discharge  Devoria Albe, MD, FACEP    MDM  Pt have have had an amaurosis fugax yesterday,but it is not clear, he also has some odd complaints about his left arm.  Will advise patient to start taking ASA and get a PCP, which at his age he should have. This is his fourth ED visit since Oct 15th.    I personally performed the services described in this documentation, which was scribed in my presence. The recorded information has been reviewed and considered.  Devoria Albe, MD, FACEP    Ward Givens, MD 08/23/12 [phone]

## 2012-08-23 NOTE — ED Notes (Signed)
Vision 20/25 bilaterally.

## 2012-08-23 NOTE — ED Notes (Signed)
Patient with no complaints at this time. Respirations even and unlabored. Skin warm/dry. Discharge instructions reviewed with patient at this time. Patient given opportunity to voice concerns/ask questions. Patient discharged at this time and left Emergency Department with steady gait.   

## 2012-08-23 NOTE — ED Notes (Signed)
Post void bladder scan = 0 ml.  Patient also had large BM.  Reportedly loose w/few formed pieces.

## 2014-03-31 ENCOUNTER — Emergency Department (HOSPITAL_COMMUNITY)
Admission: EM | Admit: 2014-03-31 | Discharge: 2014-03-31 | Disposition: A | Discharge status: Home / Self Care | Payer: Medicare Other | Attending: Emergency Medicine | Admitting: Emergency Medicine

## 2014-03-31 ENCOUNTER — Encounter (HOSPITAL_COMMUNITY): Payer: Self-pay | Admitting: Emergency Medicine

## 2014-03-31 DIAGNOSIS — Z7982 Long term (current) use of aspirin: Secondary | ICD-10-CM | POA: Insufficient documentation

## 2014-03-31 DIAGNOSIS — R066 Hiccough: Secondary | ICD-10-CM | POA: Insufficient documentation

## 2014-03-31 MED ORDER — CHLORPROMAZINE HCL 25 MG PO TABS: 25.0000 mg | ORAL_TABLET | Freq: Four times a day (QID) | ORAL | Status: DC | PRN

## 2014-03-31 NOTE — Discharge Instructions (Signed)
Hiccups Hiccups are caused by a sudden contraction of the muscles between the ribs and the muscle under your lungs (diaphragm). When you hiccup, the top of your windpipe (glottis) closes immediately after your diaphragm contracts. This makes the typical 'hic' sound. A hiccup is a reflex that you cannot stop. Unlike other reflexes such as coughing and sneezing, hiccups do not seem to have any useful purpose. There are 3 types of hiccups:   Benign bouts: last less than 48 hours.  Persistent: last more than 48 hours but less than 1 month.  Intractable: last more than 1 month. CAUSES  Most people have bouts of hiccups from time to time. They start for no apparent reason, last a short while, and then stop. Sometimes they are due to:  A temporary swollen stomach caused by overeating or eating too fast, eating spicy foods, drinking fizzy drinks, or swallowing air.  A sudden change in temperature (very hot or cold foods or drinks, a cold shower).  Drinking alcohol or using tobacco. There are no particular tests used to diagnose hiccups. Hiccups are usually considered harmless and do not point to a serious medical condition. However, there can be underlying medical problems that may cause hiccups, such as pneumonia, diabetes, metabolic problems, tumors, abdominal infections or injuries, and neurologic problems.You must follow up with your caregiver if your symptoms persist or become a frequent problem. TREATMENT   Most cases need no treatment. A bout of benign hiccups usually does not last long.  Medicine is sometimes needed to stop persistent hiccups. Medicine may be given intravenously (IV) or by mouth.  Hypnosis or acupuncture may be suggested.  Surgery to affect the nerve that supplies the diaphragm may be tried in severe cases.  Treatment of an underlying cause is needed in some cases. HOME CARE INSTRUCTIONS  Popular remedies that may stop a bout of hiccups include:  Gargling ice  water.  Swallowing granulated sugar.  Biting on a lemon.  Holding your breath, breathing fast, or breathing into a paper bag.  Bearing down.  Gasping after a sudden fright.  Pulling your tongue gently.  Distraction. SEEK MEDICAL CARE IF:   Hiccups last for more than 48 hours.  You are given medicine but your hiccups do not get better.  New symptoms show up.  You cannot sleep or eat due to the hiccups.  You have unexpected weight loss.  You have trouble breathing or swallowing.  You develop severe pain in your abdomen or other areas.  You develop numbness, tingling, or weakness. Document Released: 11/12/2001 Document Revised: 11/26/2011 Document Reviewed: 10/25/2010 [facility] Patient Information 2015 [place], [place]. This information is not intended to replace advice given to you by your health care provider. Make sure you discuss any questions you have with your health care provider.  Chlorpromazine tablets What is this medicine? CHLORPROMAZINE (klor PROE ma zeen) has many different uses. It is used to treat certain mental and behavioral disorders. It is also used to control nausea and vomiting, nervousness before surgery, and hiccups that will not go away. It is also used to treat episodes of porphyria and in combination with other medicines to treat tetanus. This medicine may be used for other purposes; ask your health care provider or pharmacist if you have questions. COMMON BRAND NAME(S): Thorazine What should I tell my health care provider before I take this medicine? They need to know if you have any of these conditions: -blood disorders or disease -dementia -frequently drink alcoholic beverages -liver disease -  Parkinson's disease -Reye's syndrome -uncontrollable movement disorder -an unusual or allergic reaction to chlorpromazine, sulfa drugs, other medicines, foods, dyes, or preservatives -pregnant or trying to get pregnant -breast-feeding How should I use  this medicine? Take this medicine by mouth with a glass of water. Follow the directions on the prescription label. Take your doses at regular intervals. Do not take your medicine more often than directed. Do not stop taking this medicine suddenly. This can cause nausea, vomiting, and dizziness. Ask your doctor or health care professional for advice if you are to stop taking this medicine. Talk to your pediatrician regarding the use of this medicine in children. Special care may be needed. While this medicine may be prescribed for children as young as 6 months for selected conditions, precautions do apply. Overdosage: If you think you have taken too much of this medicine contact a poison control center or emergency room at once. NOTE: This medicine is only for you. Do not share this medicine with others. What if I miss a dose? If you miss a dose, take it as soon as you can. If it is almost time for your next dose, take only that dose. Do not take double or extra doses. What may interact with this medicine? Do not take this medicine with any of the following medications: -amoxapine -arsenic trioxide -certain antibiotics like gatifloxacin, grepafloxacin, sparfloxacin -chloroquine -cisapride -clozapine -droperidol -ephedrine -levomethadyl -maprotiline -medicines for mental depression -medicines to control irregular heart rhythms -phenylpropanolamine -pimozide -pindolol -propranolol -ranolazine -risperidone -trimethobenzamide -ziprasidone This medicine may also interact with the following medications: -barbiturate medicines for inducing sleep or treating seizures, like phenobarbital -diuretics -local and general anesthetics -phenytoin -prescription pain medicines -warfarin This list may not describe all possible interactions. Give your health care provider a list of all the medicines, herbs, non-prescription drugs, or dietary supplements you use. Also tell them if you smoke, drink  alcohol, or use illegal drugs. Some items may interact with your medicine. What should I watch for while using this medicine? Visit your doctor or health care professional for regular checks on your progress. You may get drowsy, dizzy, or have blurred vision. Do not drive, use machinery, or do anything that needs mental alertness until you know how this medicine affects you. Do not stand or sit up quickly, especially if you are an older patient. This reduces the risk of dizzy or fainting spells. Alcohol can increase possible dizziness or drowsiness. Avoid alcoholic drinks. This medicine can reduce the response of your body to heat or cold. Dress warm in cold weather and stay hydrated in hot weather. If possible, avoid extreme temperatures like saunas, hot tubs, very hot or cold showers, or activities that can cause dehydration such as vigorous exercise. This medicine can make you more sensitive to the sun. Keep out of the sun. If you cannot avoid being in the sun, wear protective clothing and use sunscreen. Do not use sun lamps or tanning beds/booths. Your mouth may get dry. Chewing sugarless gum or sucking hard candy, and drinking plenty of water may help. Contact your doctor if the problem does not go away or is severe. What side effects may I notice from receiving this medicine? Side effects that you should report to your doctor or health care professional as soon as possible: -allergic reactions like skin rash, itching or hives, swelling of the face, lips, or tongue -breast enlargement in men or women -breast milk in women who are not breast-feeding -breathing problems -changes in vision -chest pain -  confusion, drooling, restlessness -dark urine -fast, irregular heartbeat -feeling faint or lightheaded, falls -fever, chills, sore throat -seizures -stomach area pain -uncontrollable movements of the eyes, mouth, head, arms, legs -unusual bleeding, bruising -unusually weak ot  tired -yellowing of skin or eyes Side effects that usually do not require medical attention (report to your doctor or health care professional if they continue or are bothersome): -change in sex drive or performance -headache -trouble passing urine -trouble sleeping This list may not describe all possible side effects. Call your doctor for medical advice about side effects. You may report side effects to FDA at 1-800-FDA-1088. Where should I keep my medicine? Keep out of the reach of children. Store at room temperature between 15 and 30 degrees C (59 and 86 degrees F). Throw away any unused medicine after the expiration date. NOTE: This sheet is a summary. It may not cover all possible information. If you have questions about this medicine, talk to your doctor, pharmacist, or health care provider.  2015, [employer]. (2012-01-22 16:21:23)

## 2014-03-31 NOTE — ED Provider Notes (Signed)
CSN: [id number]     Arrival date & time 03/31/14  0555 History   First MD Initiated Contact with Patient 03/31/14 0622     Chief Complaint  Patient presents with  . Hiccups     (Consider location/radiation/quality/duration/timing/severity/associated sxs/prior Treatment) The history is provided by the patient.   73 year old male who comes in with hiccups since yesterday morning. He states that the hiccups tender, in paroxysms and then he has a period of time where he does not have a hiccups. He denies any fever, chills, sweats. He denies chest pain, dyspnea, cough. He denies nausea, vomiting, diarrhea. He had problems with hiccups about one year ago and came into the ED and received a shot which gave him relief.  History reviewed. No pertinent past medical history. Past Surgical History  Procedure Laterality Date  . Abdominal surgery     No family history on file. History  Substance Use Topics  . Smoking status: Never Smoker   . Smokeless tobacco: Not on file  . Alcohol Use: Yes     Comment: "sometimes"    Review of Systems  All other systems reviewed and are negative.     Allergies  Review of patient's allergies indicates no known allergies.  Home Medications   Prior to Admission medications   Medication Sig Start Date End Date Taking? Authorizing Provider  aspirin 81 MG tablet Take 81 mg by mouth daily.   Yes Historical Provider, MD   BP 158/108  Pulse 63  Temp(Src) 98.1 F (36.7 C) (Oral)  Resp 18  Ht 5\' 5"  (1.651 m)  Wt 165 lb (74.844 kg)  BMI 27.46 kg/m2  SpO2 99% Physical Exam  Nursing note and vitals reviewed.  73 year old male, resting comfortably and in no acute distress. Vital signs are significant for hypertension with blood pressure 150/108. Oxygen saturation is 99%, which is normal. Head is normocephalic and atraumatic. PERRLA, EOMI. Oropharynx is clear. Neck is nontender and supple without adenopathy or JVD. Back is nontender and there is no CVA  tenderness. Lungs are clear without rales, wheezes, or rhonchi. Chest is nontender. Heart has regular rate and rhythm without murmur. Abdomen is soft, flat, nontender without masses or hepatosplenomegaly and peristalsis is normoactive. Extremities have no cyanosis or edema, full range of motion is present. Skin is warm and dry without rash. Neurologic: Mental status is normal, cranial nerves are intact, there are no motor or sensory deficits.  ED Course  Procedures (including critical care time)  MDM   Final diagnoses:  Hiccoughs    Recurrent hiccups. Old records are reviewed and he had visits in October and December of 2013 where he received chlorpromazine on both occasions and metoclopramide on one occasion. I do not see any indication for diagnostic workup at this point. He is a nonsmoker and did have a x-rays done when he had his prior episode of hiccups. He is driving himself home, so I will not give him an injection of chlorpromazine in the ED. He is given a prescription for chlorpromazine tablets to take as needed at home. He is advised that his blood pressure was slightly elevated and that he should have it rechecked in the next several days.    Delora Fuel, MD 41/93/79 [id number]

## 2014-03-31 NOTE — ED Notes (Signed)
Pt reporting no hiccups at present.

## 2014-03-31 NOTE — ED Notes (Signed)
Pt c/o hiccups. States they have been coming off and on since yesterday. States "I came up here for them a year ago and they gave me a shot". Denies any pain.

## 2014-07-11 ENCOUNTER — Encounter (HOSPITAL_COMMUNITY): Payer: Self-pay | Admitting: Emergency Medicine

## 2014-07-11 ENCOUNTER — Emergency Department (HOSPITAL_COMMUNITY): Payer: Medicare Other

## 2014-07-11 ENCOUNTER — Emergency Department (HOSPITAL_COMMUNITY)
Admission: EM | Admit: 2014-07-11 | Discharge: 2014-07-11 | Disposition: A | Discharge status: Home / Self Care | Payer: Medicare Other | Attending: Emergency Medicine | Admitting: Emergency Medicine

## 2014-07-11 DIAGNOSIS — S3992XA Unspecified injury of lower back, initial encounter: Secondary | ICD-10-CM | POA: Diagnosis present

## 2014-07-11 DIAGNOSIS — Y9289 Other specified places as the place of occurrence of the external cause: Secondary | ICD-10-CM | POA: Diagnosis not present

## 2014-07-11 DIAGNOSIS — Y9389 Activity, other specified: Secondary | ICD-10-CM | POA: Insufficient documentation

## 2014-07-11 DIAGNOSIS — Z8719 Personal history of other diseases of the digestive system: Secondary | ICD-10-CM | POA: Insufficient documentation

## 2014-07-11 DIAGNOSIS — W1830XA Fall on same level, unspecified, initial encounter: Secondary | ICD-10-CM | POA: Insufficient documentation

## 2014-07-11 DIAGNOSIS — S300XXA Contusion of lower back and pelvis, initial encounter: Secondary | ICD-10-CM | POA: Insufficient documentation

## 2014-07-11 DIAGNOSIS — T148XXA Other injury of unspecified body region, initial encounter: Secondary | ICD-10-CM

## 2014-07-11 DIAGNOSIS — R52 Pain, unspecified: Secondary | ICD-10-CM

## 2014-07-11 MED ORDER — TRAMADOL HCL 50 MG PO TABS: 50.0000 mg | ORAL_TABLET | Freq: Four times a day (QID) | ORAL | Status: DC | PRN

## 2014-07-11 NOTE — ED Notes (Signed)
MD at bedside. 

## 2014-07-11 NOTE — Discharge Instructions (Signed)
Contusion °A contusion is a deep bruise. Contusions are the result of an injury that caused bleeding under the skin. The contusion may turn blue, purple, or yellow. Minor injuries will give you a painless contusion, but more severe contusions may stay painful and swollen for a few weeks.  °CAUSES  °A contusion is usually caused by a blow, trauma, or direct force to an area of the body. °SYMPTOMS  °· Swelling and redness of the injured area. °· Bruising of the injured area. °· Tenderness and soreness of the injured area. °· Pain. °DIAGNOSIS  °The diagnosis can be made by taking a history and physical exam. An X-ray, CT scan, or MRI may be needed to determine if there were any associated injuries, such as fractures. °TREATMENT  °Specific treatment will depend on what area of the body was injured. In general, the best treatment for a contusion is resting, icing, elevating, and applying cold compresses to the injured area. Over-the-counter medicines may also be recommended for pain control. Ask your caregiver what the best treatment is for your contusion. °HOME CARE INSTRUCTIONS  °· Put ice on the injured area. °¨ Put ice in a plastic bag. °¨ Place a towel between your skin and the bag. °¨ Leave the ice on for 15-20 minutes, 3-4 times a day, or as directed by your health care provider. °· Only take over-the-counter or prescription medicines for pain, discomfort, or fever as directed by your caregiver. Your caregiver may recommend avoiding anti-inflammatory medicines (aspirin, ibuprofen, and naproxen) for 48 hours because these medicines may increase bruising. °· Rest the injured area. °· If possible, elevate the injured area to reduce swelling. °SEEK IMMEDIATE MEDICAL CARE IF:  °· You have increased bruising or swelling. °· You have pain that is getting worse. °· Your swelling or pain is not relieved with medicines. °MAKE SURE YOU:  °· Understand these instructions. °· Will watch your condition. °· Will get help right  away if you are not doing well or get worse. °Document Released: 06/13/2005 Document Revised: 09/08/2013 Document Reviewed: 07/09/2011 °[employer]® Patient Information ©2015 [employer]. This information is not intended to replace advice given to you by your health care provider. Make sure you discuss any questions you have with your health care provider. ° °

## 2014-07-11 NOTE — ED Notes (Signed)
Per patient fell on floor after drinking last night. Patient c/o pain in left lower back and flank. Denies hitting head or LOC.

## 2014-07-11 NOTE — ED Provider Notes (Signed)
CSN: [id number]     Arrival date & time 07/11/14  4097 History  This chart was scribed for Dorie Rank, MD by Hilda Lias, ED Scribe. This patient was seen in room APA18/APA18 and the patient's care was started at 9:57 AM.   The history is provided by the patient. No language interpreter was used.    HPI Comments: Joshua Mckee is a 73 y.o. male who presents to the Emergency Department complaining of left lower back and side pain after a fall that occurred last night after drinking alcohol. Pt denies LOC or headache.    Past Medical History  Diagnosis Date  . GI bleed    Past Surgical History  Procedure Laterality Date  . Abdominal surgery     History reviewed. No pertinent family history. History  Substance Use Topics  . Smoking status: Never Smoker   . Smokeless tobacco: Never Used  . Alcohol Use: 0.6 oz/week    1 Shots of liquor per week     Comment: "sometimes"    Review of Systems  All other systems reviewed and are negative.   A complete 10 system review of systems was obtained and all systems are negative except as noted in the HPI and PMH.    Allergies  Review of patient's allergies indicates no known allergies.  Home Medications   Prior to Admission medications   Medication Sig Start Date End Date Taking? Authorizing Provider  traMADol (ULTRAM) 50 MG tablet Take 1 tablet (50 mg total) by mouth every 6 (six) hours as needed. 07/11/14   Dorie Rank, MD   BP 152/106  Pulse 92  Temp(Src) 98 F (36.7 C) (Oral)  Resp 16  Ht 5\' 8"  (1.727 m)  Wt 163 lb (73.936 kg)  BMI 24.79 kg/m2  SpO2 100% Physical Exam  Nursing note and vitals reviewed. Constitutional: He appears well-developed and well-nourished. No distress.  HENT:  Head: Normocephalic and atraumatic.  Right Ear: External ear normal.  Left Ear: External ear normal.  Eyes: Conjunctivae are normal. Right eye exhibits no discharge. Left eye exhibits no discharge. No scleral icterus.  Neck: Neck supple.  No tracheal deviation present.  Cardiovascular: Normal rate.   Pulmonary/Chest: Effort normal. No stridor. No respiratory distress.  Musculoskeletal: He exhibits no edema.       Cervical back: He exhibits no tenderness and no bony tenderness.       Thoracic back: He exhibits no tenderness and no bony tenderness.       Lumbar back: He exhibits tenderness (paraspinal region left side). He exhibits no swelling.  Full ROM all 4 extremities No tenderness to palpation Pt ambulates without difficulty  Neurological: He is alert. Cranial nerve deficit: no gross deficits.  Skin: Skin is warm and dry. No rash noted.  Psychiatric: He has a normal mood and affect.    ED Course  Procedures (including critical care time)  DIAGNOSTIC STUDIES: Oxygen Saturation is 100% on RA, normal by my interpretation.    COORDINATION OF CARE: 9:59 AM Discussed treatment plan with pt at bedside and pt agreed to plan.   Labs Review Labs Reviewed - No data to display  Imaging Review Dg Lumbar Spine Complete  07/11/2014   CLINICAL DATA:  73 year old male with history of fall yesterday evening presenting with lower back pain and left-sided hip pain.  EXAM: LUMBAR SPINE - COMPLETE 4+ VIEW  COMPARISON:  No priors.  FINDINGS: No acute displaced fractures or compression type fractures. Alignment is anatomic. Multilevel degenerative disc  disease and facet arthropathy, most severe at L5-S1. No defects of the pars interarticularis are noted.  IMPRESSION: 1. No acute radiographic abnormality of the lumbar spine. 2. Multilevel degenerative disc disease and lumbar spondylosis.   Electronically Signed   By: Vinnie Langton M.D.   On: 07/11/2014 11:28   Dg Hip Complete Left  07/11/2014   CLINICAL DATA:  73 year old male with history of trauma from a fall yesterday evening presenting with low back pain and left-sided hip pain.  EXAM: LEFT HIP - COMPLETE 2+ VIEW  COMPARISON:  No priors.  FINDINGS: AP view of the bony pelvis  demonstrates no acute displaced pelvic ring fractures. Bilateral proximal femurs as visualized appear intact, the left femoral head is properly located. There is joint space narrowing, subchondral sclerosis, subchondral cyst formation and osteophyte formation in the hip joints bilaterally (right greater than left), compatible with osteoarthritis (mild moderate on the left and moderate to severe on the right).  IMPRESSION: 1. No acute radiographic abnormality of the bony pelvis or the left hip. 2. Bilateral hip joint osteoarthritis, as above.   Electronically Signed   By: Vinnie Langton M.D.   On: 07/11/2014 11:29      MDM   Final diagnoses:  Pain  Contusion    Pt is still able to walk.  Doubt occult fracture.  Dc home with pain meds. Follow up with PCP  I personally performed the services described in this documentation, which was scribed in my presence.  The recorded information has been reviewed and is accurate.    Dorie Rank, MD 07/11/14 [phone]

## 2014-07-13 ENCOUNTER — Emergency Department (HOSPITAL_COMMUNITY): Payer: Medicare Other

## 2014-07-13 ENCOUNTER — Encounter (HOSPITAL_COMMUNITY): Payer: Self-pay | Admitting: Emergency Medicine

## 2014-07-13 ENCOUNTER — Emergency Department (HOSPITAL_COMMUNITY)
Admission: EM | Admit: 2014-07-13 | Discharge: 2014-07-13 | Disposition: A | Discharge status: Home / Self Care | Payer: Medicare Other | Attending: Emergency Medicine | Admitting: Emergency Medicine

## 2014-07-13 DIAGNOSIS — S3992XA Unspecified injury of lower back, initial encounter: Secondary | ICD-10-CM | POA: Diagnosis present

## 2014-07-13 DIAGNOSIS — Y9389 Activity, other specified: Secondary | ICD-10-CM | POA: Insufficient documentation

## 2014-07-13 DIAGNOSIS — Z8719 Personal history of other diseases of the digestive system: Secondary | ICD-10-CM | POA: Diagnosis not present

## 2014-07-13 DIAGNOSIS — M25552 Pain in left hip: Secondary | ICD-10-CM

## 2014-07-13 DIAGNOSIS — W01198A Fall on same level from slipping, tripping and stumbling with subsequent striking against other object, initial encounter: Secondary | ICD-10-CM | POA: Insufficient documentation

## 2014-07-13 DIAGNOSIS — S79912A Unspecified injury of left hip, initial encounter: Secondary | ICD-10-CM | POA: Insufficient documentation

## 2014-07-13 DIAGNOSIS — Y9289 Other specified places as the place of occurrence of the external cause: Secondary | ICD-10-CM | POA: Insufficient documentation

## 2014-07-13 DIAGNOSIS — W19XXXA Unspecified fall, initial encounter: Secondary | ICD-10-CM

## 2014-07-13 MED ORDER — OXYCODONE-ACETAMINOPHEN 5-325 MG PO TABS
1.0000 | ORAL_TABLET | Freq: Once | ORAL | Status: AC
  Administered 2014-07-13: 1 via ORAL
  Filled 2014-07-13: qty 1

## 2014-07-13 MED ORDER — HYDROCODONE-ACETAMINOPHEN 5-325 MG PO TABS: 1.0000 | ORAL_TABLET | Freq: Four times a day (QID) | ORAL | Status: DC | PRN

## 2014-07-13 NOTE — ED Notes (Signed)
MD at bedside. 

## 2014-07-13 NOTE — Discharge Instructions (Signed)
Return to work wed.   Follow up with dr. Luna Glasgow if your hip does not improve.  Get a family md

## 2014-07-13 NOTE — ED Notes (Signed)
Patient c/o left lower back pain. Patient seen here on Sunday after falling Saturday night and hitting back. Patient given tramadol-per patient medication is not helping with pain.

## 2014-07-14 NOTE — ED Provider Notes (Signed)
CSN: [id number]     Arrival date & time 07/13/14  1033 History   First MD Initiated Contact with Patient 07/13/14 1251     Chief Complaint  Patient presents with  . Back Pain     (Consider location/radiation/quality/duration/timing/severity/associated sxs/prior Treatment) Patient is a 73 y.o. male presenting with hip pain. The history is provided by the patient (the pt fell and has left hip pain).  Hip Pain This is a new problem. The current episode started more than 2 days ago. The problem occurs constantly. The problem has not changed since onset.Pertinent negatives include no chest pain, no abdominal pain and no headaches. The symptoms are aggravated by bending. Nothing relieves the symptoms.    Past Medical History  Diagnosis Date  . GI bleed    Past Surgical History  Procedure Laterality Date  . Abdominal surgery     History reviewed. No pertinent family history. History  Substance Use Topics  . Smoking status: Never Smoker   . Smokeless tobacco: Never Used  . Alcohol Use: 0.6 oz/week    1 Shots of liquor per week     Comment: "sometimes"    Review of Systems  Constitutional: Negative for appetite change and fatigue.  HENT: Negative for congestion, ear discharge and sinus pressure.   Eyes: Negative for discharge.  Respiratory: Negative for cough.   Cardiovascular: Negative for chest pain.  Gastrointestinal: Negative for abdominal pain and diarrhea.  Genitourinary: Negative for frequency and hematuria.  Musculoskeletal: Negative for back pain.       Left hip pain  Skin: Negative for rash.  Neurological: Negative for seizures and headaches.  Psychiatric/Behavioral: Negative for hallucinations.      Allergies  Review of patient's allergies indicates no known allergies.  Home Medications   Prior to Admission medications   Medication Sig Start Date End Date Taking? Authorizing Provider  traMADol (ULTRAM) 50 MG tablet Take 50 mg by mouth every 6 (six) hours as  needed for moderate pain.   Yes Historical Provider, MD  HYDROcodone-acetaminophen (NORCO/VICODIN) 5-325 MG per tablet Take 1 tablet by mouth every 6 (six) hours as needed for moderate pain. 07/13/14   Maudry Diego, MD   BP 147/98  Pulse 56  Temp(Src) 98.3 F (36.8 C) (Oral)  Resp 18  Ht 5\' 5"  (1.651 m)  Wt 163 lb (73.936 kg)  BMI 27.12 kg/m2  SpO2 100% Physical Exam  Constitutional: He is oriented to person, place, and time. He appears well-developed.  HENT:  Head: Normocephalic.  Eyes: Conjunctivae are normal.  Neck: No tracheal deviation present.  Cardiovascular:  No murmur heard. Musculoskeletal: Normal range of motion.  Mild tender left hip tenderness  Neurological: He is oriented to person, place, and time.  Skin: Skin is warm.  Psychiatric: He has a normal mood and affect.    ED Course  Procedures (including critical care time) Labs Review Labs Reviewed - No data to display  Imaging Review Ct Hip Left Wo Contrast  07/13/2014   CLINICAL DATA:  Continued low back and left hip pain since falling 2 days ago. Subsequent encounter.  EXAM: CT OF THE LEFT HIP WITHOUT CONTRAST  TECHNIQUE: Multidetector CT imaging of the left hip was performed according to the standard protocol. Multiplanar CT image reconstructions were also generated.  COMPARISON:  Radiographs 07/11/2014.  FINDINGS: Examination is limited to the left hip and lower hemipelvis. The mineralization is adequate. There is no evidence of acute fracture, dislocation or femoral head avascular necrosis. Mild left  hip and sacroiliac degenerative changes are present.  No large hip joint effusion or periarticular fluid collection is seen. Scattered vascular calcifications are noted. There is mildly prominent fat within the left inguinal canal.  IMPRESSION: 1. No acute left hip findings demonstrated. 2. Mild left hip and sacroiliac degenerative changes.   Electronically Signed   By: Camie Patience M.D.   On: 07/13/2014 14:12      EKG Interpretation None      MDM   Final diagnoses:  Fall  Hip pain, left    The chart was scribed for me under my direct supervision.  I personally performed the history, physical, and medical decision making and all procedures in the evaluation of this patient.Maudry Diego, MD 07/14/14 [phone]

## 2015-03-26 ENCOUNTER — Encounter (HOSPITAL_COMMUNITY): Payer: Self-pay | Admitting: Emergency Medicine

## 2015-03-26 ENCOUNTER — Emergency Department (HOSPITAL_COMMUNITY)
Admission: EM | Admit: 2015-03-26 | Discharge: 2015-03-26 | Disposition: A | Discharge status: Home / Self Care | Payer: BLUE CROSS/BLUE SHIELD | Attending: Emergency Medicine | Admitting: Emergency Medicine

## 2015-03-26 DIAGNOSIS — Z8719 Personal history of other diseases of the digestive system: Secondary | ICD-10-CM | POA: Insufficient documentation

## 2015-03-26 DIAGNOSIS — R079 Chest pain, unspecified: Secondary | ICD-10-CM | POA: Diagnosis not present

## 2015-03-26 DIAGNOSIS — R419 Unspecified symptoms and signs involving cognitive functions and awareness: Secondary | ICD-10-CM

## 2015-03-26 DIAGNOSIS — R41840 Attention and concentration deficit: Secondary | ICD-10-CM | POA: Insufficient documentation

## 2015-03-26 DIAGNOSIS — L729 Follicular cyst of the skin and subcutaneous tissue, unspecified: Secondary | ICD-10-CM | POA: Diagnosis present

## 2015-03-26 NOTE — ED Provider Notes (Signed)
CSN: [id number]     Arrival date & time 03/26/15  0628 History   First MD Initiated Contact with Patient 03/26/15 [phone]     Chief Complaint  Patient presents with  . Cyst     (Consider location/radiation/quality/duration/timing/severity/associated sxs/prior Treatment) Patient is a 74 y.o. male presenting with chest pain. The history is provided by the patient (pt comes in complaining of pain pointing to his xyphoid.   he states he has never felt this before).  Chest Pain Pain location:  Substernal area Pain quality: dull   Pain radiates to:  Does not radiate Pain radiates to the back: no   Pain severity:  No pain Onset quality:  Sudden Timing:  Rare Progression:  Resolved Chronicity:  New Associated symptoms: no abdominal pain, no back pain, no cough, no fatigue and no headache     Past Medical History  Diagnosis Date  . GI bleed    Past Surgical History  Procedure Laterality Date  . Abdominal surgery     History reviewed. No pertinent family history. History  Substance Use Topics  . Smoking status: Never Smoker   . Smokeless tobacco: Never Used  . Alcohol Use: 0.6 oz/week    1 Shots of liquor per week     Comment: "sometimes"    Review of Systems  Constitutional: Negative for appetite change and fatigue.  HENT: Negative for congestion, ear discharge and sinus pressure.   Eyes: Negative for discharge.  Respiratory: Negative for cough.   Cardiovascular: Positive for chest pain.  Gastrointestinal: Negative for abdominal pain and diarrhea.  Genitourinary: Negative for frequency and hematuria.  Musculoskeletal: Negative for back pain.  Skin: Negative for rash.  Neurological: Negative for seizures and headaches.  Psychiatric/Behavioral: Negative for hallucinations.      Allergies  Review of patient's allergies indicates no known allergies.  Home Medications   Prior to Admission medications   Medication Sig Start Date End Date Taking? Authorizing Provider   HYDROcodone-acetaminophen (NORCO/VICODIN) 5-325 MG per tablet Take 1 tablet by mouth every 6 (six) hours as needed for moderate pain. 07/13/14   Milton Ferguson, MD  traMADol (ULTRAM) 50 MG tablet Take 50 mg by mouth every 6 (six) hours as needed for moderate pain.    Historical Provider, MD   BP 136/98 mmHg  Pulse 62  Temp(Src) 97.8 F (36.6 C) (Oral)  Resp 18  Ht 5\' 5"  (1.651 m)  Wt 161 lb (73.029 kg)  BMI 26.79 kg/m2  SpO2 100% Physical Exam  Constitutional: He is oriented to person, place, and time. He appears well-developed.  HENT:  Head: Normocephalic.  Eyes: Conjunctivae and EOM are normal. No scleral icterus.  Neck: Neck supple. No thyromegaly present.  Cardiovascular: Normal rate and regular rhythm.  Exam reveals no gallop and no friction rub.   No murmur heard. Pulmonary/Chest: No stridor. He has no wheezes. He has no rales. He exhibits tenderness.  Pt has minor tender xyphoid  Abdominal: He exhibits no distension. There is no tenderness. There is no rebound.  Musculoskeletal: Normal range of motion. He exhibits no edema.  Lymphadenopathy:    He has no cervical adenopathy.  Neurological: He is oriented to person, place, and time. He exhibits normal muscle tone. Coordination normal.  Skin: No rash noted. No erythema.  Psychiatric: He has a normal mood and affect. His behavior is normal.    ED Course  Procedures (including critical care time) Labs Review Labs Reviewed - No data to display  Imaging Review No results  found.   EKG Interpretation None      MDM   Final diagnoses:  Cognitive complaints with normal exam    Normal anatomy of chest.  Pt reassured     Milton Ferguson, MD 03/26/15 [phone]

## 2015-03-26 NOTE — Discharge Instructions (Signed)
Follow up with your md as needed °

## 2015-03-26 NOTE — ED Notes (Signed)
Patient reports he noticed a hard knot in his abdomen 2 days ago. States it is not causing him any pain. Reports he thinks he needs an xray.

## 2015-09-25 ENCOUNTER — Emergency Department (HOSPITAL_COMMUNITY)
Admission: EM | Admit: 2015-09-25 | Discharge: 2015-09-25 | Disposition: A | Discharge status: Home / Self Care | Payer: BLUE CROSS/BLUE SHIELD | Attending: Emergency Medicine | Admitting: Emergency Medicine

## 2015-09-25 ENCOUNTER — Encounter (HOSPITAL_COMMUNITY): Payer: Self-pay | Admitting: Emergency Medicine

## 2015-09-25 DIAGNOSIS — K920 Hematemesis: Secondary | ICD-10-CM | POA: Diagnosis present

## 2015-09-25 DIAGNOSIS — K922 Gastrointestinal hemorrhage, unspecified: Secondary | ICD-10-CM

## 2015-09-25 DIAGNOSIS — Z7982 Long term (current) use of aspirin: Secondary | ICD-10-CM | POA: Insufficient documentation

## 2015-09-25 DIAGNOSIS — K2971 Gastritis, unspecified, with bleeding: Secondary | ICD-10-CM | POA: Insufficient documentation

## 2015-09-25 DIAGNOSIS — Z9889 Other specified postprocedural states: Secondary | ICD-10-CM | POA: Insufficient documentation

## 2015-09-25 DIAGNOSIS — R112 Nausea with vomiting, unspecified: Secondary | ICD-10-CM

## 2015-09-25 LAB — COMPREHENSIVE METABOLIC PANEL
ALBUMIN: 4.3 g/dL (ref 3.5–5.0)
ALT: 19 U/L (ref 17–63)
AST: 25 U/L (ref 15–41)
Alkaline Phosphatase: 75 U/L (ref 38–126)
Anion gap: 9 (ref 5–15)
BUN: 18 mg/dL (ref 6–20)
CHLORIDE: 96 mmol/L — AB (ref 101–111)
CO2: 33 mmol/L — AB (ref 22–32)
CREATININE: 1.08 mg/dL (ref 0.61–1.24)
Calcium: 9.8 mg/dL (ref 8.9–10.3)
GFR calc Af Amer: >60 mL/min (ref >60)
GLUCOSE: 112 mg/dL — AB (ref 65–99)
POTASSIUM: 4.2 mmol/L (ref 3.5–5.1)
SODIUM: 138 mmol/L (ref 135–145)
Total Bilirubin: 0.9 mg/dL (ref 0.3–1.2)
Total Protein: 8.1 g/dL (ref 6.5–8.1)

## 2015-09-25 LAB — CBC
HEMATOCRIT: 48.1 % (ref 39.0–52.0)
Hemoglobin: 16.8 g/dL (ref 13.0–17.0)
MCH: 31.9 pg (ref 26.0–34.0)
MCHC: 34.9 g/dL (ref 30.0–36.0)
MCV: 91.4 fL (ref 78.0–100.0)
PLATELETS: 182 10*3/uL (ref 150–400)
RBC: 5.26 MIL/uL (ref 4.22–5.81)
RDW: 13.8 % (ref 11.5–15.5)
WBC: 11.4 10*3/uL — ABNORMAL HIGH (ref 4.0–10.5)

## 2015-09-25 LAB — POC OCCULT BLOOD, ED: Fecal Occult Bld: NEGATIVE

## 2015-09-25 MED ORDER — ONDANSETRON HCL 4 MG/2ML IJ SOLN
4.0000 mg | Freq: Once | INTRAMUSCULAR | Status: AC
  Administered 2015-09-25: 4 mg via INTRAVENOUS
  Filled 2015-09-25: qty 2

## 2015-09-25 NOTE — ED Notes (Signed)
Pt states vomited black emisis x2 today.

## 2015-09-25 NOTE — ED Provider Notes (Signed)
CSN: [id number]     Arrival date & time 09/25/15  1840 History   First MD Initiated Contact with Patient 09/25/15 1947     Chief Complaint  Patient presents with  . Hematemesis      The history is provided by the patient.   patient presents with nausea vomiting and states he vomited up black. His wife thinks he ate some bad chicken. No diarrhea. His abdominal pain is dull. States is from the thrown out. He had some previous abdominal surgery but is difficult to determine what it was. No fevers. No other bleeding. No other bleeding. Does have previous history of GI bleed. States he is given some medicines and went away. That was years ago.  Past Medical History  Diagnosis Date  . GI bleed    Past Surgical History  Procedure Laterality Date  . Abdominal surgery     History reviewed. No pertinent family history. Social History  Substance Use Topics  . Smoking status: Never Smoker   . Smokeless tobacco: Never Used  . Alcohol Use: 1.8 oz/week    3 Shots of liquor per week     Comment: 3 days a week.    Review of Systems  Constitutional: Negative for activity change and appetite change.  Eyes: Negative for pain.  Respiratory: Negative for chest tightness and shortness of breath.   Cardiovascular: Negative for chest pain and leg swelling.  Gastrointestinal: Positive for nausea, vomiting and abdominal pain. Negative for diarrhea.  Genitourinary: Negative for flank pain.  Musculoskeletal: Negative for back pain and neck stiffness.  Skin: Negative for rash.  Neurological: Negative for weakness, numbness and headaches.  Psychiatric/Behavioral: Negative for behavioral problems.      Allergies  Review of patient's allergies indicates no known allergies.  Home Medications   Prior to Admission medications   Medication Sig Start Date End Date Taking? Authorizing Provider  aspirin EC 81 MG tablet Take 81 mg by mouth daily.   Yes Historical Provider, MD  bismuth subsalicylate (PEPTO  BISMOL) 262 MG chewable tablet Chew 524 mg by mouth as needed for indigestion or diarrhea or loose stools.   Yes Historical Provider, MD   BP 131/97 mmHg  Pulse 105  Temp(Src) 97.5 F (36.4 C) (Oral)  Resp 18  Ht 5\' 5"  (1.651 m)  Wt 163 lb (73.936 kg)  BMI 27.12 kg/m2  SpO2 98% Physical Exam  Constitutional: He is oriented to person, place, and time. He appears well-developed and well-nourished.  HENT:  Head: Normocephalic and atraumatic.  Eyes: Pupils are equal, round, and reactive to light.  Neck: Normal range of motion. Neck supple.  Cardiovascular: Normal rate, regular rhythm and normal heart sounds.   No murmur heard. Pulmonary/Chest: Effort normal and breath sounds normal.  Abdominal: Soft. He exhibits no distension. There is tenderness. There is no rebound.  Some fullness epigastric and right upper quadrant area. Supraumbilical vertical scar  Genitourinary: Guaiac negative stool.  Musculoskeletal: Normal range of motion. He exhibits no edema.  Neurological: He is alert and oriented to person, place, and time. No cranial nerve deficit.  Skin: Skin is warm and dry.  Psychiatric: He has a normal mood and affect.  Nursing note and vitals reviewed.   ED Course  Procedures (including critical care time) Labs Review Labs Reviewed  COMPREHENSIVE METABOLIC PANEL - Abnormal; Notable for the following:    Chloride 96 (*)    CO2 33 (*)    Glucose, Bld 112 (*)    All other  components within normal limits  CBC - Abnormal; Notable for the following:    WBC 11.4 (*)    All other components within normal limits  POC OCCULT BLOOD, ED    Imaging Review No results found. I have personally reviewed and evaluated these images and lab results as part of my medical decision-making.   EKG Interpretation None      MDM   Final diagnoses:  Non-intractable vomiting with nausea, vomiting of unspecified type  Gastrointestinal hemorrhage, unspecified gastritis, unspecified  gastrointestinal hemorrhage type    Patient was vomiting. Feels better after treatment. Reportedly vomited up some blood but Hemoccult-negative. Feels better after treatment and will be discharged home. Some mild right-sided tenderness is to follow-up with gastroenterology. Heart rate improved on my reexam.    Davonna Belling, MD 09/25/15 2324

## 2015-09-25 NOTE — ED Notes (Signed)
Pt alert & oriented x4, stable gait. Patient given discharge instructions, paperwork & prescription(s). Patient  instructed to stop at the registration desk to finish any additional paperwork. Patient verbalized understanding. Pt left department w/ no further questions. 

## 2015-09-25 NOTE — Discharge Instructions (Signed)
Gastrointestinal Bleeding Gastrointestinal (GI) bleeding means there is bleeding somewhere along the digestive tract, between the mouth and anus. CAUSES  There are many different problems that can cause GI bleeding. Possible causes include:  Esophagitis. This is inflammation, irritation, or swelling of the esophagus.  Hemorrhoids.These are veins that are full of blood (engorged) in the rectum. They cause pain, inflammation, and may bleed.  Anal fissures.These are areas of painful tearing which may bleed. They are often caused by passing hard stool.  Diverticulosis.These are pouches that form on the colon over time, with age, and may bleed significantly.  Diverticulitis.This is inflammation in areas with diverticulosis. It can cause pain, fever, and bloody stools, although bleeding is rare.  Polyps and cancer. Colon cancer often starts out as precancerous polyps.  Gastritis and ulcers.Bleeding from the upper gastrointestinal tract (near the stomach) may travel through the intestines and produce black, sometimes tarry, often bad smelling stools. In certain cases, if the bleeding is fast enough, the stools may not be black, but red. This condition may be life-threatening. SYMPTOMS   Vomiting bright red blood or material that looks like coffee grounds.  Bloody, black, or tarry stools. DIAGNOSIS  Your caregiver may diagnose your condition by taking your history and performing a physical exam. More tests may be needed, including:  X-rays and other imaging tests.  Esophagogastroduodenoscopy (EGD). This test uses a flexible, lighted tube to look at your esophagus, stomach, and small intestine.  Colonoscopy. This test uses a flexible, lighted tube to look at your colon. TREATMENT  Treatment depends on the cause of your bleeding.   For bleeding from the esophagus, stomach, small intestine, or colon, the caregiver doing your EGD or colonoscopy may be able to stop the bleeding as part of  the procedure.  Inflammation or infection of the colon can be treated with medicines.  Many rectal problems can be treated with creams, suppositories, or warm baths.  Surgery is sometimes needed.  Blood transfusions are sometimes needed if you have lost a lot of blood. If bleeding is slow, you may be allowed to go home. If there is a lot of bleeding, you will need to stay in the hospital for observation. HOME CARE INSTRUCTIONS   Take any medicines exactly as prescribed.  Keep your stools soft by eating foods that are high in fiber. These foods include whole grains, legumes, fruits, and vegetables. Prunes (1 to 3 a day) work well for many people.  Drink enough fluids to keep your urine clear or pale yellow. SEEK IMMEDIATE MEDICAL CARE IF:   Your bleeding increases.  You feel lightheaded, weak, or you faint.  You have severe cramps in your back or abdomen.  You pass large blood clots in your stool.  Your problems are getting worse. MAKE SURE YOU:   Understand these instructions.  Will watch your condition.  Will get help right away if you are not doing well or get worse.   This information is not intended to replace advice given to you by your health care provider. Make sure you discuss any questions you have with your health care provider.   Document Released: 08/31/2000 Document Revised: 08/20/2012 Document Reviewed: 02/21/2015 Elsevier Interactive Patient Education 2016 Elsevier Inc.  Nausea and Vomiting Nausea is a sick feeling that often comes before throwing up (vomiting). Vomiting is a reflex where stomach contents come out of your mouth. Vomiting can cause severe loss of body fluids (dehydration). Children and elderly adults can become dehydrated quickly, especially if  they also have diarrhea. Nausea and vomiting are symptoms of a condition or disease. It is important to find the cause of your symptoms. CAUSES   Direct irritation of the stomach lining. This  irritation can result from increased acid production (gastroesophageal reflux disease), infection, food poisoning, taking certain medicines (such as nonsteroidal anti-inflammatory drugs), alcohol use, or tobacco use.  Signals from the brain.These signals could be caused by a headache, heat exposure, an inner ear disturbance, increased pressure in the brain from injury, infection, a tumor, or a concussion, pain, emotional stimulus, or metabolic problems.  An obstruction in the gastrointestinal tract (bowel obstruction).  Illnesses such as diabetes, hepatitis, gallbladder problems, appendicitis, kidney problems, cancer, sepsis, atypical symptoms of a heart attack, or eating disorders.  Medical treatments such as chemotherapy and radiation.  Receiving medicine that makes you sleep (general anesthetic) during surgery. DIAGNOSIS Your caregiver may ask for tests to be done if the problems do not improve after a few days. Tests may also be done if symptoms are severe or if the reason for the nausea and vomiting is not clear. Tests may include:  Urine tests.  Blood tests.  Stool tests.  Cultures (to look for evidence of infection).  X-rays or other imaging studies. Test results can help your caregiver make decisions about treatment or the need for additional tests. TREATMENT You need to stay well hydrated. Drink frequently but in small amounts.You may wish to drink water, sports drinks, clear broth, or eat frozen ice pops or gelatin dessert to help stay hydrated.When you eat, eating slowly may help prevent nausea.There are also some antinausea medicines that may help prevent nausea. HOME CARE INSTRUCTIONS   Take all medicine as directed by your caregiver.  If you do not have an appetite, do not force yourself to eat. However, you must continue to drink fluids.  If you have an appetite, eat a normal diet unless your caregiver tells you differently.  Eat a variety of complex carbohydrates  (rice, wheat, potatoes, bread), lean meats, yogurt, fruits, and vegetables.  Avoid high-fat foods because they are more difficult to digest.  Drink enough water and fluids to keep your urine clear or pale yellow.  If you are dehydrated, ask your caregiver for specific rehydration instructions. Signs of dehydration may include:  Severe thirst.  Dry lips and mouth.  Dizziness.  Dark urine.  Decreasing urine frequency and amount.  Confusion.  Rapid breathing or pulse. SEEK IMMEDIATE MEDICAL CARE IF:   You have blood or brown flecks (like coffee grounds) in your vomit.  You have black or bloody stools.  You have a severe headache or stiff neck.  You are confused.  You have severe abdominal pain.  You have chest pain or trouble breathing.  You do not urinate at least once every 8 hours.  You develop cold or clammy skin.  You continue to vomit for longer than 24 to 48 hours.  You have a fever. MAKE SURE YOU:   Understand these instructions.  Will watch your condition.  Will get help right away if you are not doing well or get worse.   This information is not intended to replace advice given to you by your health care provider. Make sure you discuss any questions you have with your health care provider.   Document Released: 09/03/2005 Document Revised: 11/26/2011 Document Reviewed: 01/31/2011 Elsevier Interactive Patient Education [employer].

## 2015-09-25 NOTE — ED Notes (Signed)
PT c/o midline abdominal "soreness" with nausea and vomiting today and states black colored emesis and states hx of GI bleed. PT states BM last night.

## 2015-09-25 NOTE — ED Notes (Signed)
Pt ambulated to restroom & returned to room w/ no complications. 

## 2015-09-26 ENCOUNTER — Telehealth: Payer: Self-pay | Admitting: Gastroenterology

## 2015-09-26 NOTE — Telephone Encounter (Signed)
Yes, that is fine. 

## 2015-09-26 NOTE — Telephone Encounter (Signed)
Pt was seen in the ED recently and was referred to follow up with [place]. Please advise if we can accept him as a new patient. [phone]

## 2015-09-27 NOTE — Telephone Encounter (Signed)
Pt is aware of OV 1/11 at 0830 with [facility]

## 2015-09-28 ENCOUNTER — Encounter: Payer: Self-pay | Admitting: Gastroenterology

## 2015-09-28 ENCOUNTER — Ambulatory Visit (INDEPENDENT_AMBULATORY_CARE_PROVIDER_SITE_OTHER): Payer: BLUE CROSS/BLUE SHIELD | Admitting: Gastroenterology

## 2015-09-28 ENCOUNTER — Other Ambulatory Visit: Payer: Self-pay

## 2015-09-28 VITALS — BP 110/76 | HR 99 | Temp 97.0°F | Ht 65.0 in | Wt 154.6 lb

## 2015-09-28 DIAGNOSIS — R1013 Epigastric pain: Secondary | ICD-10-CM | POA: Diagnosis not present

## 2015-09-28 MED ORDER — PANTOPRAZOLE SODIUM 40 MG PO TBEC: 40.0000 mg | DELAYED_RELEASE_TABLET | Freq: Every day | ORAL | Status: DC

## 2015-09-28 NOTE — Assessment & Plan Note (Addendum)
75 year old male with history of vague symptoms to include chronic hiccups, episode of nausea/vomiting, and possible black emesis. No NSAIDs and only takes 81 mg ASA daily. Hgb stable. Seen recently in ED and without distress today. Query esophagitis, gastritis. No PPI currently. Physical exam unrevealing. Will arrange for EGD for further evaluation in near future. As of note, he will need a colonoscopy at a later date once upper GI symptoms are addressed.  Proceed with upper endoscopy in the near future with Dr. Gala Romney. The risks, benefits, and alternatives have been discussed in detail with patient. They have stated understanding and desire to proceed.  Phenergan 12.5 mg IV on call for EGD due to intermittent alcohol use Colonoscopy at a later date, initial screening.

## 2015-09-28 NOTE — Progress Notes (Signed)
Primary Care Physician:  No PCP Per Patient Primary Gastroenterologist:  Dr. Gala Romney   Chief Complaint  Patient presents with  . Emesis  . Nausea    HPI:   Joshua Mckee is a 75 y.o. male presenting today at the request of the [facility] secondary to reported hematemesis.   States he had acute episode of N/V, states emesis was black. Has had issues with hiccups since at least 2015. Associated abdominal bloating. +flatus. No further vomiting. No further nausea. Still with hiccups. No melena. No hematochezia. Has intermittent reflux. No weight loss or lack of appetite. No dysphagia. No prior EGD. Takes 81 mg aspirin daily and thorazine for hiccups since 2015. No NSAIDs. Very poor historian.   Past Medical History  Diagnosis Date  . Hiccups     Past Surgical History  Procedure Laterality Date  . Exploratory laparotomy      Dr. Tamala Julian 2006, SBO secondary to adhesions     Current Outpatient Prescriptions  Medication Sig Dispense Refill  . aspirin EC 81 MG tablet Take 81 mg by mouth daily.    . chlorproMAZINE (THORAZINE) 25 MG tablet Take 25 mg by mouth 3 (three) times daily.     No current facility-administered medications for this visit.    Allergies as of 09/28/2015  . (No Known Allergies)    Family History  Problem Relation Age of Onset  . Colon cancer Neg Hx     Social History   Social History  . Marital Status: Widowed    Spouse Name: N/A  . Number of Children: N/A  . Years of Education: N/A   Occupational History  . Not on file.   Social History Main Topics  . Smoking status: Never Smoker   . Smokeless tobacco: Never Used  . Alcohol Use: 1.8 oz/week    3 Shots of liquor per week     Comment: 3 days a week.  . Drug Use: No  . Sexual Activity: Not on file   Other Topics Concern  . Not on file   Social History Narrative    Review of Systems: Gen: Denies any fever, chills, fatigue, weight loss, lack of appetite.  CV: Denies chest pain,  heart palpitations, peripheral edema, syncope.  Resp: Denies shortness of breath at rest or with exertion. Denies wheezing or cough.  GI: see HPI  GU : Denies urinary burning, urinary frequency, urinary hesitancy MS: +shoulder pain  Derm: Denies rash, itching, dry skin Psych: Denies depression, anxiety, memory loss, and confusion Heme: Denies bruising, bleeding, and enlarged lymph nodes.  Physical Exam: BP 110/76 mmHg  Pulse 99  Temp(Src) 97 F (36.1 C) (Oral)  Ht 5\' 5"  (1.651 m)  Wt 154 lb 9.6 oz (70.126 kg)  BMI 25.73 kg/m2 General:   Alert and oriented. Pleasant and cooperative. Well-nourished and well-developed.  Head:  Normocephalic and atraumatic. Eyes:  Without icterus, sclera clear and conjunctiva pink.  Ears:  Normal auditory acuity. Nose:  No deformity, discharge,  or lesions. Mouth:  No deformity or lesions, oral mucosa pink.  Lungs:  Clear to auscultation bilaterally. No wheezes, rales, or rhonchi. No distress.  Heart:  S1, S2 present without murmurs appreciated.  Abdomen:  +BS, soft, non-tender and non-distended. No HSM noted. No guarding or rebound. No masses appreciated.  Rectal:  Deferred  Msk:  Symmetrical without gross deformities. Normal posture. Extremities:  Without  edema. Neurologic:  Alert and  oriented x4;  grossly normal neurologically. Psych:  Alert and cooperative. Normal mood and affect.  Lab Results  Component Value Date   WBC 11.4* 09/25/2015   HGB 16.8 09/25/2015   HCT 48.1 09/25/2015   MCV 91.4 09/25/2015   PLT 182 09/25/2015   Lab Results  Component Value Date   ALT 19 09/25/2015   AST 25 09/25/2015   ALKPHOS 75 09/25/2015   BILITOT 0.9 09/25/2015

## 2015-09-28 NOTE — Progress Notes (Signed)
No pcp per patient 

## 2015-09-28 NOTE — Patient Instructions (Signed)
Start taking Protonix once each morning, 30 minutes before breakfast. I sent this to your pharmacy.  Call us or go to the emergency room if you start having severe abdominal pain, vomiting blood, or unable to tolerate food or drinks.  We have scheduled you for an upper endoscopy with Dr. Gala Romney in the near future.

## 2015-09-29 ENCOUNTER — Encounter: Payer: Self-pay | Admitting: General Practice

## 2015-09-29 ENCOUNTER — Encounter: Payer: Self-pay | Admitting: Gastroenterology

## 2015-10-01 ENCOUNTER — Encounter (HOSPITAL_COMMUNITY): Payer: Self-pay

## 2015-10-01 ENCOUNTER — Emergency Department (HOSPITAL_COMMUNITY)
Admission: EM | Admit: 2015-10-01 | Discharge: 2015-10-01 | Disposition: A | Discharge status: Home / Self Care | Payer: BLUE CROSS/BLUE SHIELD | Attending: Emergency Medicine | Admitting: Emergency Medicine

## 2015-10-01 DIAGNOSIS — Z7982 Long term (current) use of aspirin: Secondary | ICD-10-CM | POA: Diagnosis not present

## 2015-10-01 DIAGNOSIS — R066 Hiccough: Secondary | ICD-10-CM | POA: Insufficient documentation

## 2015-10-01 DIAGNOSIS — Z79899 Other long term (current) drug therapy: Secondary | ICD-10-CM | POA: Diagnosis not present

## 2015-10-01 MED ORDER — ONDANSETRON 8 MG PO TBDP
8.0000 mg | ORAL_TABLET | Freq: Once | ORAL | Status: AC
  Administered 2015-10-01: 8 mg via ORAL
  Filled 2015-10-01: qty 1

## 2015-10-01 NOTE — Discharge Instructions (Signed)
Hiccups °A hiccup is the result of a sudden shortening of the muscle below your lungs (diaphragm). This movement of your diaphragm causes a sudden inhalation followed by the closing of your vocal cords, which causes the hiccup sound. Most people get the hiccups. Typically, hiccups last only a short amount of time.  °There are three types of hiccups:  °· Benign. These hiccups last less than 48 hours.   °· Persistent. These hiccups last more than 48 hours, but less than 1 month.   °· Intractable. These hiccups last more than 1 month.   °A hiccup is a reflex. You cannot control reflexes.  °HOME CARE INSTRUCTIONS  °Watch your hiccups for any changes. The following actions may help to lessen any discomfort that you are feeling: °· Eat small meals.   °· Limit alcohol intake to no more than 1 drink per day for nonpregnant women and 2 drinks per day for men. One drink equals 12 oz of beer, 5 oz of wine, or 1½ oz of hard liquor. °· Limit drinking carbonated or fizzy drinks, such as soda. °· Eat and chew your food slowly.   °· Avoid eating or drinking hot or spicy foods and drinks. °· Take medicines only as directed by your health care provider.   °SEEK MEDICAL CARE IF:  °· Your hiccups last for more than 48 hours.   °· Your hiccups do not improve with treatment. °· You cannot sleep or eat due to the hiccups.   °· You have unexpected weight loss due to the hiccups.   °· You have a fever.   °· You have trouble breathing or swallowing.   °· You develop severe pain in your abdomen. °· You develop numbness, tingling, or weakness. °  °This information is not intended to replace advice given to you by your health care provider. Make sure you discuss any questions you have with your health care provider. °  °Document Released: 11/12/2001 Document Revised: 01/18/2015 Document Reviewed: 08/30/2014 °Elsevier Interactive Patient Education ©2016 Elsevier Inc. ° °

## 2015-10-01 NOTE — ED Provider Notes (Signed)
CSN: [id number]     Arrival date & time 10/01/15  [id number] History   First MD Initiated Contact with Patient 10/01/15 0347     Chief Complaint  Patient presents with  . Hiccups    The history is provided by the patient.  Patient presents for hiccups He reports it has been worsening over the past week He was seen in the ED 1/8 for vomiting/hiccups and it is worse since then It is not responding to home medications No fever/vomting in past 24 hours No CP/SOB No abd pain No back pain   Past Medical History  Diagnosis Date  . Hiccups    Past Surgical History  Procedure Laterality Date  . Exploratory laparotomy      Dr. Tamala Julian 2006, SBO secondary to adhesions    Family History  Problem Relation Age of Onset  . Colon cancer Neg Hx    Social History  Substance Use Topics  . Smoking status: Never Smoker   . Smokeless tobacco: Never Used  . Alcohol Use: 1.8 oz/week    3 Shots of liquor per week     Comment: 3 days a week.    Review of Systems  Constitutional: Negative for fever.  Gastrointestinal: Negative for vomiting.      Allergies  Review of patient's allergies indicates no known allergies.  Home Medications   Prior to Admission medications   Medication Sig Start Date End Date Taking? Authorizing Provider  pantoprazole (PROTONIX) 40 MG tablet Take 1 tablet (40 mg total) by mouth daily. 30 minutes before breakfast 09/28/15  Yes Orvil Feil, NP  aspirin EC 81 MG tablet Take 81 mg by mouth daily.    Historical Provider, MD  chlorproMAZINE (THORAZINE) 25 MG tablet Take 25 mg by mouth 3 (three) times daily.    Historical Provider, MD   BP 142/100 mmHg  Pulse 98  Temp(Src) 98.3 F (36.8 C) (Oral)  Resp 20  Ht 5\' 5"  (1.651 m)  Wt 72.576 kg  BMI 26.63 kg/m2  SpO2 100% Physical Exam CONSTITUTIONAL: Well developed/well nourished HEAD: Normocephalic/atraumatic EYES: EOMI ENMT: Mucous membranes moist, uvula midline, no stridor NECK: supple no meningeal signs CV:  S1/S2 noted, no murmurs/rubs/gallops noted LUNGS: Lungs are clear to auscultation bilaterally, no apparent distress ABDOMEN: soft, nontender NEURO: Pt is awake/alert/appropriate, moves all extremitiesx4.  No facial droop.   EXTREMITIES: pulses normal/equal, full ROM SKIN: warm, color normal PSYCH: no abnormalities of mood noted, alert and oriented to situation  ED Course  Procedures  Pt sleeping on arrival to room He is well appearing He some hiccups while I was in room No distress noted He has already seen GI for this He has been on thorazine as well as a PPI Apparently he has long h/o hiccups Stable for d/c home  Medications  ondansetron (ZOFRAN-ODT) disintegrating tablet 8 mg (8 mg Oral Given 10/01/15 0343)    MDM   Final diagnoses:  None    Nursing notes including past medical history and social history reviewed and considered in documentation     Ripley Fraise, MD 10/01/15 0411

## 2015-10-01 NOTE — ED Notes (Signed)
Pt states he has had hiccups off and on for a couple of weeks.  Pt states he was given pantoprazole to take but has not helped

## 2015-10-05 ENCOUNTER — Ambulatory Visit (HOSPITAL_COMMUNITY)
Admission: RE | Admit: 2015-10-05 | Discharge: 2015-10-05 | Disposition: A | Discharge status: Home Health | Payer: BLUE CROSS/BLUE SHIELD | Source: Ambulatory Visit | Attending: Internal Medicine | Admitting: Internal Medicine

## 2015-10-05 ENCOUNTER — Encounter (HOSPITAL_COMMUNITY)
Admission: RE | Disposition: A | Discharge status: Home Health | Payer: Self-pay | Source: Ambulatory Visit | Attending: Internal Medicine

## 2015-10-05 ENCOUNTER — Encounter (HOSPITAL_COMMUNITY): Payer: Self-pay | Admitting: *Deleted

## 2015-10-05 DIAGNOSIS — K2100 Gastro-esophageal reflux disease with esophagitis, without bleeding: Secondary | ICD-10-CM | POA: Insufficient documentation

## 2015-10-05 DIAGNOSIS — Z7982 Long term (current) use of aspirin: Secondary | ICD-10-CM | POA: Diagnosis not present

## 2015-10-05 DIAGNOSIS — K222 Esophageal obstruction: Secondary | ICD-10-CM | POA: Diagnosis not present

## 2015-10-05 DIAGNOSIS — Z79899 Other long term (current) drug therapy: Secondary | ICD-10-CM | POA: Insufficient documentation

## 2015-10-05 DIAGNOSIS — K449 Diaphragmatic hernia without obstruction or gangrene: Secondary | ICD-10-CM | POA: Diagnosis not present

## 2015-10-05 DIAGNOSIS — R066 Hiccough: Secondary | ICD-10-CM | POA: Diagnosis not present

## 2015-10-05 DIAGNOSIS — K2210 Ulcer of esophagus without bleeding: Secondary | ICD-10-CM | POA: Insufficient documentation

## 2015-10-05 DIAGNOSIS — R1013 Epigastric pain: Secondary | ICD-10-CM | POA: Insufficient documentation

## 2015-10-05 HISTORY — PX: ESOPHAGOGASTRODUODENOSCOPY: SHX5428

## 2015-10-05 SURGERY — EGD (ESOPHAGOGASTRODUODENOSCOPY)
Anesthesia: Moderate Sedation

## 2015-10-05 MED ORDER — MEPERIDINE HCL 100 MG/ML IJ SOLN
INTRAMUSCULAR | Status: AC
  Filled 2015-10-05: qty 2

## 2015-10-05 MED ORDER — LIDOCAINE VISCOUS 2 % MT SOLN
OROMUCOSAL | Status: DC | PRN
  Administered 2015-10-05: 1 via OROMUCOSAL

## 2015-10-05 MED ORDER — ONDANSETRON HCL 4 MG/2ML IJ SOLN
INTRAMUSCULAR | Status: AC
  Filled 2015-10-05: qty 2

## 2015-10-05 MED ORDER — LIDOCAINE VISCOUS 2 % MT SOLN
OROMUCOSAL | Status: AC
  Filled 2015-10-05: qty 15

## 2015-10-05 MED ORDER — SODIUM CHLORIDE 0.9 % IV SOLN
INTRAVENOUS | Status: DC
  Administered 2015-10-05: 07:00:00 via INTRAVENOUS

## 2015-10-05 MED ORDER — SODIUM CHLORIDE 0.9 % IJ SOLN
INTRAMUSCULAR | Status: AC
  Filled 2015-10-05: qty 3

## 2015-10-05 MED ORDER — ONDANSETRON HCL 4 MG/2ML IJ SOLN
INTRAMUSCULAR | Status: DC | PRN
  Administered 2015-10-05: 4 mg via INTRAVENOUS

## 2015-10-05 MED ORDER — PROMETHAZINE HCL 25 MG/ML IJ SOLN
INTRAMUSCULAR | Status: AC
  Filled 2015-10-05: qty 1

## 2015-10-05 MED ORDER — MIDAZOLAM HCL 5 MG/5ML IJ SOLN
INTRAMUSCULAR | Status: DC | PRN
  Administered 2015-10-05: 1 mg via INTRAVENOUS
  Administered 2015-10-05: 2 mg via INTRAVENOUS

## 2015-10-05 MED ORDER — MEPERIDINE HCL 100 MG/ML IJ SOLN
INTRAMUSCULAR | Status: DC | PRN
  Administered 2015-10-05 (×2): 25 mg

## 2015-10-05 MED ORDER — MIDAZOLAM HCL 5 MG/5ML IJ SOLN
INTRAMUSCULAR | Status: AC
  Filled 2015-10-05: qty 10

## 2015-10-05 MED ORDER — PROMETHAZINE HCL 25 MG/ML IJ SOLN
12.5000 mg | Freq: Once | INTRAMUSCULAR | Status: AC
  Administered 2015-10-05: 12.5 mg via INTRAVENOUS

## 2015-10-05 NOTE — Discharge Instructions (Signed)
EGD Discharge instructions Please read the instructions outlined below and refer to this sheet in the next few weeks. These discharge instructions provide you with general information on caring for yourself after you leave the hospital. Your doctor may also give you specific instructions. While your treatment has been planned according to the most current medical practices available, unavoidable complications occasionally occur. If you have any problems or questions after discharge, please call your doctor. ACTIVITY  You may resume your regular activity but move at a slower pace for the next 24 hours.   Take frequent rest periods for the next 24 hours.   Walking will help expel (get rid of) the air and reduce the bloated feeling in your abdomen.   No driving for 24 hours (because of the anesthesia (medicine) used during the test).   You may shower.   Do not sign any important legal documents or operate any machinery for 24 hours (because of the anesthesia used during the test).  NUTRITION  Drink plenty of fluids.   You may resume your normal diet.   Begin with a light meal and progress to your normal diet.   Avoid alcoholic beverages for 24 hours or as instructed by your caregiver.  MEDICATIONS  You may resume your normal medications unless your caregiver tells you otherwise.  WHAT YOU CAN EXPECT TODAY  You may experience abdominal discomfort such as a feeling of fullness or gas pains.  FOLLOW-UP  Your doctor will discuss the results of your test with you.  SEEK IMMEDIATE MEDICAL ATTENTION IF ANY OF THE FOLLOWING OCCUR:  Excessive nausea (feeling sick to your stomach) and/or vomiting.   Severe abdominal pain and distention (swelling).   Trouble swallowing.   Temperature over 101 F (37.8 C).   Rectal bleeding or vomiting of blood.    GERD information provided  Begin protonix 40 mg daily  Office visit in 6 months Gastroesophageal Reflux Disease, Adult Normally,  food travels down the esophagus and stays in the stomach to be digested. However, when a person has gastroesophageal reflux disease (GERD), food and stomach acid move back up into the esophagus. When this happens, the esophagus becomes sore and inflamed. Over time, GERD can create small holes (ulcers) in the lining of the esophagus.  CAUSES This condition is caused by a problem with the muscle between the esophagus and the stomach (lower esophageal sphincter, or LES). Normally, the LES muscle closes after food passes through the esophagus to the stomach. When the LES is weakened or abnormal, it does not close properly, and that allows food and stomach acid to go back up into the esophagus. The LES can be weakened by certain dietary substances, medicines, and medical conditions, including:  Tobacco use.  Pregnancy.  Having a hiatal hernia.  Heavy alcohol use.  Certain foods and beverages, such as coffee, chocolate, onions, and peppermint. RISK FACTORS This condition is more likely to develop in:  People who have an increased body weight.  People who have connective tissue disorders.  People who use NSAID medicines. SYMPTOMS Symptoms of this condition include:  Heartburn.  Difficult or painful swallowing.  The feeling of having a lump in the throat.  Abitter taste in the mouth.  Bad breath.  Having a large amount of saliva.  Having an upset or bloated stomach.  Belching.  Chest pain.  Shortness of breath or wheezing.  Ongoing (chronic) cough or a night-time cough.  Wearing away of tooth enamel.  Weight loss. Different conditions can  cause chest pain. Make sure to see your health care provider if you experience chest pain. DIAGNOSIS Your health care provider will take a medical history and perform a physical exam. To determine if you have mild or severe GERD, your health care provider may also monitor how you respond to treatment. You may also have other tests,  including:  An endoscopy toexamine your stomach and esophagus with a small camera.  A test thatmeasures the acidity level in your esophagus.  A test thatmeasures how much pressure is on your esophagus.  A barium swallow or modified barium swallow to show the shape, size, and functioning of your esophagus. TREATMENT The goal of treatment is to help relieve your symptoms and to prevent complications. Treatment for this condition may vary depending on how severe your symptoms are. Your health care provider may recommend:  Changes to your diet.  Medicine.  Surgery. HOME CARE INSTRUCTIONS Diet  Follow a diet as recommended by your health care provider. This may involve avoiding foods and drinks such as:  Coffee and tea (with or without caffeine).  Drinks that containalcohol.  Energy drinks and sports drinks.  Carbonated drinks or sodas.  Chocolate and cocoa.  Peppermint and mint flavorings.  Garlic and onions.  Horseradish.  Spicy and acidic foods, including peppers, chili powder, curry powder, vinegar, hot sauces, and barbecue sauce.  Citrus fruit juices and citrus fruits, such as oranges, lemons, and limes.  Tomato-based foods, such as red sauce, chili, salsa, and pizza with red sauce.  Fried and fatty foods, such as donuts, french fries, potato chips, and high-fat dressings.  High-fat meats, such as hot dogs and fatty cuts of red and white meats, such as rib eye steak, sausage, ham, and bacon.  High-fat dairy items, such as whole milk, butter, and cream cheese.  Eat small, frequent meals instead of large meals.  Avoid drinking large amounts of liquid with your meals.  Avoid eating meals during the 2-3 hours before bedtime.  Avoid lying down right after you eat.  Do not exercise right after you eat. General Instructions  Pay attention to any changes in your symptoms.  Take over-the-counter and prescription medicines only as told by your health care  provider. Do not take aspirin, ibuprofen, or other NSAIDs unless your health care provider told you to do so.  Do not use any tobacco products, including cigarettes, chewing tobacco, and e-cigarettes. If you need help quitting, ask your health care provider.  Wear loose-fitting clothing. Do not wear anything tight around your waist that causes pressure on your abdomen.  Raise (elevate) the head of your bed 6 inches (15cm).  Try to reduce your stress, such as with yoga or meditation. If you need help reducing stress, ask your health care provider.  If you are overweight, reduce your weight to an amount that is healthy for you. Ask your health care provider for guidance about a safe weight loss goal.  Keep all follow-up visits as told by your health care provider. This is important. SEEK MEDICAL CARE IF:  You have new symptoms.  You have unexplained weight loss.  You have difficulty swallowing, or it hurts to swallow.  You have wheezing or a persistent cough.  Your symptoms do not improve with treatment.  You have a hoarse voice. SEEK IMMEDIATE MEDICAL CARE IF:  You have pain in your arms, neck, jaw, teeth, or back.  You feel sweaty, dizzy, or light-headed.  You have chest pain or shortness of breath.  You vomit and your vomit looks like blood or coffee grounds.  You faint.  Your stool is bloody or black.  You cannot swallow, drink, or eat.   This information is not intended to replace advice given to you by your health care provider. Make sure you discuss any questions you have with your health care provider.

## 2015-10-05 NOTE — Progress Notes (Signed)
Spoke to Ginger about putting patient on a call back list for 6 months. Verbalized understanding.

## 2015-10-05 NOTE — Op Note (Signed)
[facility] [address] [place], [address]   ENDOSCOPY PROCEDURE REPORT  PATIENT: Joshua, Mckee  MR#: [id number] BIRTHDATE: 1941/02/06 , 24  yrs. old GENDER: male ENDOSCOPIST: R.  Garfield Cornea, MD FACP [facility] REFERRED BY:  APED PROCEDURE DATE:  14-Oct-2015 PROCEDURE:  EGD, diagnostic INDICATIONS:  Dyspepsia. MEDICATIONS: Versed 3 mg IV and Demerol 50 mg IV in divided doses. Phenergan 12.5 mg IV.  Xylocaine gel orally.  Zofran 4 mg IV. ASA CLASS:      Class II  CONSENT: The risks, benefits, limitations, alternatives and imponderables have been discussed.  The potential for biopsy, esophogeal dilation, etc. have also been reviewed.  Questions have been answered.  All parties agreeable.  Please see the history and physical in the medical record for more information.  DESCRIPTION OF PROCEDURE: After the risks benefits and alternatives of the procedure were thoroughly explained, informed consent was obtained.  The [id number] endoscope was introduced through the mouth and advanced to the second portion of the duodenum , limited by Without limitations. The instrument was slowly withdrawn as the mucosa was fully examined. Estimated blood loss is zero unless otherwise noted in this procedure report.    Noncritical Schatzki's ring.  Distal esophageal erosions within 5 mm the GE junction.  No Barrett's epithelium.  No tumor.  Tubular esophagus patent throughout his course.  Stomach empty.  2 cm hiatal hernia present.  No ulcer or infiltrating process.  Patent pylorus.  Normal-appearing first and second portion of the duodenum.  Retroflexed views revealed as previously described and Retroflexed views revealed .     The scope was then withdrawn from the patient and the procedure completed.  COMPLICATIONS: There were no immediate complications.  ENDOSCOPIC IMPRESSION: Erosive reflux esophagitis. Noncritical Schatzki's ring?"not manipulated (no dysphagia).  Hiatal hernia.  RECOMMENDATIONS: Begin Protonix 40 mg orally daily. Office visit with [place] in 6 months  REPEAT EXAM:  eSigned:  R. Garfield Cornea, MD FACP [facility] 2015-10-14 8:00 AM    CC:  CPT CODES: ICD CODES:  The ICD and CPT codes recommended by this software are interpretations from the data that the clinical staff has captured with the software.  The verification of the translation of this report to the ICD and CPT codes and modifiers is the sole responsibility of the health care institution and practicing physician where this report was generated.  [employer] will not be held responsible for the validity of the ICD and CPT codes included on this report.  AMA assumes no liability for data contained or not contained herein. CPT is a Designer, television/film set of the [employer].  PATIENT NAME:  Joshua, Mckee MR#: [id number]

## 2015-10-05 NOTE — H&P (View-Only) (Signed)
Primary Care Physician:  No PCP Per Patient Primary Gastroenterologist:  Dr. Gala Romney   Chief Complaint  Patient presents with  . Emesis  . Nausea    HPI:   Joshua Mckee is a 75 y.o. male presenting today at the request of the [facility] secondary to reported hematemesis.   States he had acute episode of N/V, states emesis was black. Has had issues with hiccups since at least 2015. Associated abdominal bloating. +flatus. No further vomiting. No further nausea. Still with hiccups. No melena. No hematochezia. Has intermittent reflux. No weight loss or lack of appetite. No dysphagia. No prior EGD. Takes 81 mg aspirin daily and thorazine for hiccups since 2015. No NSAIDs. Very poor historian.   Past Medical History  Diagnosis Date  . Hiccups     Past Surgical History  Procedure Laterality Date  . Exploratory laparotomy      Dr. Tamala Julian 2006, SBO secondary to adhesions     Current Outpatient Prescriptions  Medication Sig Dispense Refill  . aspirin EC 81 MG tablet Take 81 mg by mouth daily.    . chlorproMAZINE (THORAZINE) 25 MG tablet Take 25 mg by mouth 3 (three) times daily.     No current facility-administered medications for this visit.    Allergies as of 09/28/2015  . (No Known Allergies)    Family History  Problem Relation Age of Onset  . Colon cancer Neg Hx     Social History   Social History  . Marital Status: Widowed    Spouse Name: N/A  . Number of Children: N/A  . Years of Education: N/A   Occupational History  . Not on file.   Social History Main Topics  . Smoking status: Never Smoker   . Smokeless tobacco: Never Used  . Alcohol Use: 1.8 oz/week    3 Shots of liquor per week     Comment: 3 days a week.  . Drug Use: No  . Sexual Activity: Not on file   Other Topics Concern  . Not on file   Social History Narrative    Review of Systems: Gen: Denies any fever, chills, fatigue, weight loss, lack of appetite.  CV: Denies chest pain,  heart palpitations, peripheral edema, syncope.  Resp: Denies shortness of breath at rest or with exertion. Denies wheezing or cough.  GI: see HPI  GU : Denies urinary burning, urinary frequency, urinary hesitancy MS: +shoulder pain  Derm: Denies rash, itching, dry skin Psych: Denies depression, anxiety, memory loss, and confusion Heme: Denies bruising, bleeding, and enlarged lymph nodes.  Physical Exam: BP 110/76 mmHg  Pulse 99  Temp(Src) 97 F (36.1 C) (Oral)  Ht 5\' 5"  (1.651 m)  Wt 154 lb 9.6 oz (70.126 kg)  BMI 25.73 kg/m2 General:   Alert and oriented. Pleasant and cooperative. Well-nourished and well-developed.  Head:  Normocephalic and atraumatic. Eyes:  Without icterus, sclera clear and conjunctiva pink.  Ears:  Normal auditory acuity. Nose:  No deformity, discharge,  or lesions. Mouth:  No deformity or lesions, oral mucosa pink.  Lungs:  Clear to auscultation bilaterally. No wheezes, rales, or rhonchi. No distress.  Heart:  S1, S2 present without murmurs appreciated.  Abdomen:  +BS, soft, non-tender and non-distended. No HSM noted. No guarding or rebound. No masses appreciated.  Rectal:  Deferred  Msk:  Symmetrical without gross deformities. Normal posture. Extremities:  Without  edema. Neurologic:  Alert and  oriented x4;  grossly normal neurologically. Psych:  Alert and cooperative. Normal mood and affect.  Lab Results  Component Value Date   WBC 11.4* 09/25/2015   HGB 16.8 09/25/2015   HCT 48.1 09/25/2015   MCV 91.4 09/25/2015   PLT 182 09/25/2015   Lab Results  Component Value Date   ALT 19 09/25/2015   AST 25 09/25/2015   ALKPHOS 75 09/25/2015   BILITOT 0.9 09/25/2015

## 2015-10-05 NOTE — Interval H&P Note (Signed)
History and Physical Interval Note:  10/05/2015 7:35 AM  Joshua Mckee  has presented today for surgery, with the diagnosis of dyspepsia  The various methods of treatment have been discussed with the patient and family. After consideration of risks, benefits and other options for treatment, the patient has consented to  Procedure(s) with comments: ESOPHAGOGASTRODUODENOSCOPY (EGD) (N/A) - 7:30 as a surgical intervention .  The patient's history has been reviewed, patient examined, no change in status, stable for surgery.  I have reviewed the patient's chart and labs.  Questions were answered to the patient's satisfaction.     Robert Rourk  No change; no dysphagia.  Dx egd per plan.  The risks, benefits, limitations, alternatives and imponderables have been reviewed with the patient. Potential for esophageal dilation, biopsy, etc. have also been reviewed.  Questions have been answered. All parties agreeable.

## 2015-10-10 ENCOUNTER — Encounter (HOSPITAL_COMMUNITY): Payer: Self-pay | Admitting: Internal Medicine

## 2015-12-13 ENCOUNTER — Encounter (HOSPITAL_COMMUNITY): Payer: Self-pay | Admitting: Emergency Medicine

## 2015-12-13 DIAGNOSIS — K210 Gastro-esophageal reflux disease with esophagitis: Secondary | ICD-10-CM | POA: Insufficient documentation

## 2015-12-13 DIAGNOSIS — K2210 Ulcer of esophagus without bleeding: Secondary | ICD-10-CM | POA: Diagnosis not present

## 2015-12-13 DIAGNOSIS — Z7982 Long term (current) use of aspirin: Secondary | ICD-10-CM | POA: Insufficient documentation

## 2015-12-13 DIAGNOSIS — K920 Hematemesis: Secondary | ICD-10-CM | POA: Diagnosis not present

## 2015-12-13 DIAGNOSIS — Z79899 Other long term (current) drug therapy: Secondary | ICD-10-CM | POA: Diagnosis not present

## 2015-12-13 DIAGNOSIS — K449 Diaphragmatic hernia without obstruction or gangrene: Secondary | ICD-10-CM | POA: Insufficient documentation

## 2015-12-13 DIAGNOSIS — F1010 Alcohol abuse, uncomplicated: Secondary | ICD-10-CM | POA: Insufficient documentation

## 2015-12-13 NOTE — ED Notes (Signed)
Patient complaining of hiccups and "throwing up black stuff" since this evening.

## 2015-12-14 ENCOUNTER — Observation Stay (HOSPITAL_COMMUNITY)
Admission: EM | Admit: 2015-12-14 | Discharge: 2015-12-16 | DRG: 369 | Disposition: A | Discharge status: Home / Self Care | Payer: BLUE CROSS/BLUE SHIELD | Attending: Internal Medicine | Admitting: Internal Medicine

## 2015-12-14 ENCOUNTER — Emergency Department (HOSPITAL_COMMUNITY): Payer: BLUE CROSS/BLUE SHIELD

## 2015-12-14 DIAGNOSIS — R110 Nausea: Secondary | ICD-10-CM | POA: Diagnosis not present

## 2015-12-14 DIAGNOSIS — K921 Melena: Secondary | ICD-10-CM | POA: Insufficient documentation

## 2015-12-14 DIAGNOSIS — R066 Hiccough: Secondary | ICD-10-CM | POA: Diagnosis present

## 2015-12-14 DIAGNOSIS — R000 Tachycardia, unspecified: Secondary | ICD-10-CM

## 2015-12-14 DIAGNOSIS — K920 Hematemesis: Secondary | ICD-10-CM | POA: Diagnosis present

## 2015-12-14 DIAGNOSIS — F1010 Alcohol abuse, uncomplicated: Secondary | ICD-10-CM | POA: Diagnosis not present

## 2015-12-14 DIAGNOSIS — K2100 Gastro-esophageal reflux disease with esophagitis, without bleeding: Secondary | ICD-10-CM | POA: Diagnosis present

## 2015-12-14 DIAGNOSIS — K922 Gastrointestinal hemorrhage, unspecified: Secondary | ICD-10-CM | POA: Diagnosis present

## 2015-12-14 LAB — TYPE AND SCREEN
ABO/RH(D): B POS
Antibody Screen: NEGATIVE

## 2015-12-14 LAB — URINALYSIS, ROUTINE W REFLEX MICROSCOPIC
BILIRUBIN URINE: NEGATIVE
Glucose, UA: NEGATIVE mg/dL
Hgb urine dipstick: NEGATIVE
KETONES UR: NEGATIVE mg/dL
LEUKOCYTES UA: NEGATIVE
NITRITE: NEGATIVE
PH: 7.5 (ref 5.0–8.0)
PROTEIN: NEGATIVE mg/dL
Specific Gravity, Urine: 1.02 (ref 1.005–1.030)

## 2015-12-14 LAB — CBC
HEMATOCRIT: 45.6 % (ref 39.0–52.0)
HEMOGLOBIN: 15.6 g/dL (ref 13.0–17.0)
MCH: 30.6 pg (ref 26.0–34.0)
MCHC: 34.2 g/dL (ref 30.0–36.0)
MCV: 89.6 fL (ref 78.0–100.0)
Platelets: 239 10*3/uL (ref 150–400)
RBC: 5.09 MIL/uL (ref 4.22–5.81)
RDW: 14 % (ref 11.5–15.5)
WBC: 7.5 10*3/uL (ref 4.0–10.5)

## 2015-12-14 LAB — COMPREHENSIVE METABOLIC PANEL
ALT: 15 U/L — AB (ref 17–63)
ANION GAP: 8 (ref 5–15)
AST: 22 U/L (ref 15–41)
Albumin: 3.9 g/dL (ref 3.5–5.0)
Alkaline Phosphatase: 128 U/L — ABNORMAL HIGH (ref 38–126)
BUN: 24 mg/dL — ABNORMAL HIGH (ref 6–20)
CHLORIDE: 101 mmol/L (ref 101–111)
CO2: 28 mmol/L (ref 22–32)
CREATININE: 0.87 mg/dL (ref 0.61–1.24)
Calcium: 8.5 mg/dL — ABNORMAL LOW (ref 8.9–10.3)
GFR calc non Af Amer: >60 mL/min (ref >60)
Glucose, Bld: 114 mg/dL — ABNORMAL HIGH (ref 65–99)
Potassium: 3.9 mmol/L (ref 3.5–5.1)
SODIUM: 137 mmol/L (ref 135–145)
Total Bilirubin: 0.9 mg/dL (ref 0.3–1.2)
Total Protein: 7.7 g/dL (ref 6.5–8.1)

## 2015-12-14 LAB — HEMOGLOBIN AND HEMATOCRIT, BLOOD
HEMATOCRIT: 37.9 % — AB (ref 39.0–52.0)
HEMATOCRIT: 38.4 % — AB (ref 39.0–52.0)
HEMOGLOBIN: 12.2 g/dL — AB (ref 13.0–17.0)
Hemoglobin: 12.6 g/dL — ABNORMAL LOW (ref 13.0–17.0)

## 2015-12-14 LAB — OCCULT BLOOD GASTRIC / DUODENUM (SPECIMEN CUP)
Occult Blood, Gastric: NEGATIVE
PH, GASTRIC: 5

## 2015-12-14 LAB — APTT: APTT: 28 s (ref 24–37)

## 2015-12-14 LAB — PROTIME-INR
INR: 1.09 (ref 0.00–1.49)
PROTHROMBIN TIME: 14.3 s (ref 11.6–15.2)

## 2015-12-14 LAB — ETHANOL

## 2015-12-14 LAB — MAGNESIUM: Magnesium: 1.8 mg/dL (ref 1.7–2.4)

## 2015-12-14 MED ORDER — PANTOPRAZOLE SODIUM 40 MG IV SOLR
INTRAVENOUS | Status: AC
  Filled 2015-12-14: qty 80

## 2015-12-14 MED ORDER — ONDANSETRON HCL 4 MG/2ML IJ SOLN: 4.0000 mg | Freq: Four times a day (QID) | INTRAMUSCULAR | Status: DC | PRN

## 2015-12-14 MED ORDER — THIAMINE HCL 100 MG/ML IJ SOLN
100.0000 mg | Freq: Once | INTRAMUSCULAR | Status: AC
  Administered 2015-12-14: 100 mg via INTRAVENOUS
  Filled 2015-12-14: qty 2

## 2015-12-14 MED ORDER — ADULT MULTIVITAMIN W/MINERALS CH
1.0000 | ORAL_TABLET | Freq: Every day | ORAL | Status: DC
  Administered 2015-12-16: 1 via ORAL
  Filled 2015-12-14: qty 1

## 2015-12-14 MED ORDER — SODIUM CHLORIDE 0.9% FLUSH: 3.0000 mL | INTRAVENOUS | Status: DC | PRN

## 2015-12-14 MED ORDER — SODIUM CHLORIDE 0.9 % IV BOLUS (SEPSIS)
1000.0000 mL | Freq: Once | INTRAVENOUS | Status: AC
  Administered 2015-12-14: 1000 mL via INTRAVENOUS

## 2015-12-14 MED ORDER — ONDANSETRON HCL 4 MG PO TABS: 4.0000 mg | ORAL_TABLET | Freq: Four times a day (QID) | ORAL | Status: DC | PRN

## 2015-12-14 MED ORDER — LORAZEPAM 1 MG PO TABS: 1.0000 mg | ORAL_TABLET | Freq: Four times a day (QID) | ORAL | Status: DC | PRN

## 2015-12-14 MED ORDER — FOLIC ACID 1 MG PO TABS
1.0000 mg | ORAL_TABLET | Freq: Every day | ORAL | Status: DC
  Administered 2015-12-16: 1 mg via ORAL
  Filled 2015-12-14: qty 1

## 2015-12-14 MED ORDER — CHLORPROMAZINE HCL 25 MG/ML IJ SOLN
25.0000 mg | Freq: Once | INTRAMUSCULAR | Status: AC
  Administered 2015-12-14: 25 mg via INTRAMUSCULAR
  Filled 2015-12-14: qty 1

## 2015-12-14 MED ORDER — SODIUM CHLORIDE 0.9 % IV BOLUS (SEPSIS)
1000.0000 mL | Freq: Once | INTRAVENOUS | Status: AC
Start: 2015-12-14 — End: 2015-12-14
  Administered 2015-12-14: 1000 mL via INTRAVENOUS

## 2015-12-14 MED ORDER — SODIUM CHLORIDE 0.9 % IV SOLN
INTRAVENOUS | Status: DC
  Administered 2015-12-14: 14:00:00 via INTRAVENOUS
  Administered 2015-12-15: 1 mL via INTRAVENOUS

## 2015-12-14 MED ORDER — SODIUM CHLORIDE 0.9% FLUSH
3.0000 mL | Freq: Two times a day (BID) | INTRAVENOUS | Status: DC
  Administered 2015-12-14 – 2015-12-15 (×3): 3 mL via INTRAVENOUS

## 2015-12-14 MED ORDER — PANTOPRAZOLE SODIUM 40 MG IV SOLR: 40.0000 mg | Freq: Two times a day (BID) | INTRAVENOUS | Status: DC

## 2015-12-14 MED ORDER — PANTOPRAZOLE SODIUM 40 MG IV SOLR
INTRAVENOUS | Status: AC
Start: 2015-12-14 — End: 2015-12-14
  Filled 2015-12-14: qty 80

## 2015-12-14 MED ORDER — SODIUM CHLORIDE 0.9 % IV SOLN: 250.0000 mL | INTRAVENOUS | Status: DC | PRN

## 2015-12-14 MED ORDER — THIAMINE HCL 100 MG/ML IJ SOLN
100.0000 mg | Freq: Every day | INTRAMUSCULAR | Status: DC
  Administered 2015-12-14 – 2015-12-15 (×2): 100 mg via INTRAVENOUS
  Filled 2015-12-14 (×2): qty 2

## 2015-12-14 MED ORDER — SODIUM CHLORIDE 0.9 % IV SOLN
80.0000 mg | Freq: Once | INTRAVENOUS | Status: AC
  Administered 2015-12-14: 80 mg via INTRAVENOUS
  Filled 2015-12-14: qty 80

## 2015-12-14 MED ORDER — LORAZEPAM 2 MG/ML IJ SOLN: 1.0000 mg | Freq: Four times a day (QID) | INTRAMUSCULAR | Status: DC | PRN

## 2015-12-14 MED ORDER — SODIUM CHLORIDE 0.9 % IV SOLN
8.0000 mg/h | INTRAVENOUS | Status: DC
  Administered 2015-12-14 – 2015-12-15 (×4): 8 mg/h via INTRAVENOUS
  Filled 2015-12-14 (×5): qty 80

## 2015-12-14 MED ORDER — VITAMIN B-1 100 MG PO TABS
100.0000 mg | ORAL_TABLET | Freq: Every day | ORAL | Status: DC
  Administered 2015-12-16: 100 mg via ORAL
  Filled 2015-12-14: qty 1

## 2015-12-14 MED ORDER — SODIUM CHLORIDE 0.9 % IV SOLN
INTRAVENOUS | Status: DC
  Administered 2015-12-14: 03:00:00 via INTRAVENOUS

## 2015-12-14 MED ORDER — ONDANSETRON HCL 4 MG/2ML IJ SOLN
4.0000 mg | Freq: Once | INTRAMUSCULAR | Status: AC
  Administered 2015-12-14: 4 mg via INTRAVENOUS
  Filled 2015-12-14: qty 2

## 2015-12-14 NOTE — Progress Notes (Signed)
[facility] PROGRESS NOTE  Sundance Gama [id number] DOB: [birth year] DOA: 12/14/2015 PCP: No PCP Per Patient  Assessment/Plan: 1-Coffee Ground Emesis.  Continue with IV Protonix.  Cycle hb. GI consulted.  IV fluids.  NPO.   2-Alcohol abuse;  Will order CIWA protocol  Code Status: Full code.  Family Communication none at bedside.  Disposition Plan: Remain inpatient.    Consultants:  GI  Procedures:  none  Antibiotics:  none  HPI/Subjective: He is feeling better, last time he vomited was last night./   Objective: Filed Vitals:   12/14/15 0730 12/14/15 0800  BP: 128/94 125/95  Pulse:  84  Temp:    Resp: 14 16   No intake or output data in the 24 hours ending 12/14/15 0837 Filed Weights   12/13/15 2300  Weight: 73.483 kg (162 lb)    Exam:   General:  NAD  Cardiovascular: S 1, S 2 RRR  Respiratory: CTA  Abdomen: BS present, soft, nt  Musculoskeletal: no edema  Data Reviewed: Basic Metabolic Panel:  Recent Labs Lab 12/13/15 2316 12/14/15 0539  NA 137  --   K 3.9  --   CL 101  --   CO2 28  --   GLUCOSE 114*  --   BUN 24*  --   CREATININE 0.87  --   CALCIUM 8.5*  --   MG  --  1.8   Liver Function Tests:  Recent Labs Lab 12/13/15 2316  AST 22  ALT 15*  ALKPHOS 128*  BILITOT 0.9  PROT 7.7  ALBUMIN 3.9   No results for input(s): LIPASE, AMYLASE in the last 168 hours. No results for input(s): AMMONIA in the last 168 hours. CBC:  Recent Labs Lab 12/13/15 2316 12/14/15 0539  WBC 7.5  --   HGB 15.6 12.2*  HCT 45.6 37.9*  MCV 89.6  --   PLT 239  --    Cardiac Enzymes: No results for input(s): CKTOTAL, CKMB, CKMBINDEX, TROPONINI in the last 168 hours. BNP (last 3 results) No results for input(s): BNP in the last 8760 hours.  ProBNP (last 3 results) No results for input(s): PROBNP in the last 8760 hours.  CBG: No results for input(s): GLUCAP in the last 168 hours.  No results found for this or any previous  visit (from the past 240 hour(s)).   Studies: Dg Abd Acute W/chest  12/14/2015  CLINICAL DATA:  75 year old male with coffee ground emesis EXAM: DG ABDOMEN ACUTE W/ 1V CHEST COMPARISON:  Multiple radiographs dating back to 07/25/2012 and abdominal CT dated 01/29/2011 FINDINGS: There is chronic scarring of the left lung base with traction and focal elevation of the left hemidiaphragm as seen on the prior radiograph and CT. There is no focal consolidation, pleural effusion, or pneumothorax. The cardiac silhouette is within normal limits. Moderate stool noted throughout the colon. There is no evidence of bowel obstruction or free air. No radiopaque calculi or foreign object identified. There is degenerative changes of spine. No acute fracture. IMPRESSION: Negative abdominal radiographs.  No acute cardiopulmonary disease. Electronically Signed   By: Anner Crete M.D.   On: 12/14/2015 02:18    Scheduled Meds: . sodium chloride flush  3 mL Intravenous Q12H   Continuous Infusions: . sodium chloride    . pantoprozole (PROTONIX) infusion 8 mg/hr (12/14/15 0218)    Principal Problem:   Hematemesis Active Problems:   Reflux esophagitis   Hiccups   ETOH abuse    Time spent: 25 minutes.  Niel Hummer A  [facility] Pager [phone]. If 7PM-7AM, please contact night-coverage at [email or web address], password [facility] 12/14/2015, 8:37 AM  LOS: 0 days

## 2015-12-14 NOTE — Consult Note (Signed)
Reason for Consult:  Referring Physician: hospitalist services.- Joshua Mckee is an 75 y.o. male.  HPI: Presented to the ED yesterday. He tells me he has vomiting black vomitus. He also has had melena. He has had melena x 1 day. He takes ASA 364m daily.  Similar episode this year patient reports. He underwent an EGD by Dr.Rourk in January ( see below).  Per records, he had been vomiting black vomitus at that time.  Patient has had a drop in his hemoglobin from 15.6 to 12.2.   Daughter reports he had a BM about an hour ago and it was black. No nausea or vomiting since admission.  No BC powders or other NSAIDS except for ASA 3282mdaily.   10/04/2015 EGD: Dr.Rourk Dyspepsia  ENDOSCOPIC IMPRESSION: Erosive reflux esophagitis. Noncritical Schatzki's ring?"not manipulated (no dysphagia). Hiatal hernia.   CBC    Component Value Date/Time   WBC 7.5 12/13/2015 2316   RBC 5.09 12/13/2015 2316   HGB 12.2* 12/14/2015 0539   HCT 37.9* 12/14/2015 0539   PLT 239 12/13/2015 2316   MCV 89.6 12/13/2015 2316   MCH 30.6 12/13/2015 2316   MCHC 34.2 12/13/2015 2316   RDW 14.0 12/13/2015 2316   LYMPHSABS 1.6 08/23/2012 1630   MONOABS 0.9 08/23/2012 1630   EOSABS 0.2 08/23/2012 1630   BASOSABS 0.0 08/23/2012 1630   CBC Latest Ref Rng 12/14/2015 12/13/2015 09/25/2015  WBC 4.0 - 10.5 K/uL - 7.5 11.4(H)  Hemoglobin 13.0 - 17.0 g/dL 12.2(L) 15.6 16.8  Hematocrit 39.0 - 52.0 % 37.9(L) 45.6 48.1  Platelets 150 - 400 K/uL - 239 182         Past Medical History  Diagnosis Date  . Hiccups     Past Surgical History  Procedure Laterality Date  . Exploratory laparotomy      Dr. SmTamala Julian006, SBO secondary to adhesions   . Esophagogastroduodenoscopy N/A 10/05/2015    Procedure: ESOPHAGOGASTRODUODENOSCOPY (EGD);  Surgeon: RoDaneil DolinMD;  Location: [facility];  Service: Endoscopy;  Laterality: N/A;  7:30    Family History  Problem Relation Age of Onset  . Colon cancer Neg Hx     Social  History:  reports that he has never smoked. He has never used smokeless tobacco. He reports that he drinks about 1.8 oz of alcohol per week. He reports that he does not use illicit drugs.  Allergies: No Known Allergies  Medications: I have reviewed the patient's current medications.  Results for orders placed or performed during the hospital encounter of 12/14/15 (from the past 48 hour(s))  Comprehensive metabolic panel     Status: Abnormal   Collection Time: 12/13/15 11:16 PM  Result Value Ref Range   Sodium 137 135 - 145 mmol/L   Potassium 3.9 3.5 - 5.1 mmol/L   Chloride 101 101 - 111 mmol/L   CO2 28 22 - 32 mmol/L   Glucose, Bld 114 (H) 65 - 99 mg/dL   BUN 24 (H) 6 - 20 mg/dL   Creatinine, Ser 0.87 0.61 - 1.24 mg/dL   Calcium 8.5 (L) 8.9 - 10.3 mg/dL   Total Protein 7.7 6.5 - 8.1 g/dL   Albumin 3.9 3.5 - 5.0 g/dL   AST 22 15 - 41 U/L   ALT 15 (L) 17 - 63 U/L   Alkaline Phosphatase 128 (H) 38 - 126 U/L   Total Bilirubin 0.9 0.3 - 1.2 mg/dL   GFR calc non Af Amer >60 >60 mL/min   GFR calc Af  Amer >60 >60 mL/min    Comment: (NOTE) The eGFR has been calculated using the CKD EPI equation. This calculation has not been validated in all clinical situations. eGFR's persistently <60 mL/min signify possible Chronic Kidney Disease.    Anion gap 8 5 - 15  CBC     Status: None   Collection Time: 12/13/15 11:16 PM  Result Value Ref Range   WBC 7.5 4.0 - 10.5 K/uL   RBC 5.09 4.22 - 5.81 MIL/uL   Hemoglobin 15.6 13.0 - 17.0 g/dL   HCT 45.6 39.0 - 52.0 %   MCV 89.6 78.0 - 100.0 fL   MCH 30.6 26.0 - 34.0 pg   MCHC 34.2 30.0 - 36.0 g/dL   RDW 14.0 11.5 - 15.5 %   Platelets 239 150 - 400 K/uL  APTT     Status: None   Collection Time: 12/13/15 11:16 PM  Result Value Ref Range   aPTT 28 24 - 37 seconds  Protime-INR     Status: None   Collection Time: 12/13/15 11:16 PM  Result Value Ref Range   Prothrombin Time 14.3 11.6 - 15.2 seconds   INR 1.09 0.00 - 1.49  Ethanol     Status:  None   Collection Time: 12/13/15 11:16 PM  Result Value Ref Range   Alcohol, Ethyl (B) <5 <5 mg/dL    Comment:        LOWEST DETECTABLE LIMIT FOR SERUM ALCOHOL IS 5 mg/dL FOR MEDICAL PURPOSES ONLY   Type and screen [facility]     Status: None   Collection Time: 12/13/15 11:25 PM  Result Value Ref Range   ABO/RH(D) B POS    Antibody Screen NEG    Sample Expiration 12/16/2015   Occult bld gastric/duodenum (cup to lab)     Status: None   Collection Time: 12/14/15  1:25 AM  Result Value Ref Range   pH, Gastric 5    Occult Blood, Gastric NEGATIVE NEGATIVE  Urinalysis, Routine w reflex microscopic (not at [facility])     Status: None   Collection Time: 12/14/15  5:05 AM  Result Value Ref Range   Color, Urine YELLOW YELLOW   APPearance CLEAR CLEAR   Specific Gravity, Urine 1.020 1.005 - 1.030   pH 7.5 5.0 - 8.0   Glucose, UA NEGATIVE NEGATIVE mg/dL   Hgb urine dipstick NEGATIVE NEGATIVE   Bilirubin Urine NEGATIVE NEGATIVE   Ketones, ur NEGATIVE NEGATIVE mg/dL   Protein, ur NEGATIVE NEGATIVE mg/dL   Nitrite NEGATIVE NEGATIVE   Leukocytes, UA NEGATIVE NEGATIVE    Comment: MICROSCOPIC NOT DONE ON URINES WITH NEGATIVE PROTEIN, BLOOD, LEUKOCYTES, NITRITE, OR GLUCOSE <1000 mg/dL.  Hemoglobin and hematocrit, blood     Status: Abnormal   Collection Time: 12/14/15  5:39 AM  Result Value Ref Range   Hemoglobin 12.2 (L) 13.0 - 17.0 g/dL    Comment: DELTA CHECK NOTED RESULT REPEATED AND VERIFIED    HCT 37.9 (L) 39.0 - 52.0 %  Magnesium     Status: None   Collection Time: 12/14/15  5:39 AM  Result Value Ref Range   Magnesium 1.8 1.7 - 2.4 mg/dL    Dg Abd Acute W/chest  12/14/2015  CLINICAL DATA:  75 year old male with coffee ground emesis EXAM: DG ABDOMEN ACUTE W/ 1V CHEST COMPARISON:  Multiple radiographs dating back to 07/25/2012 and abdominal CT dated 01/29/2011 FINDINGS: There is chronic scarring of the left lung base with traction and focal elevation of the left hemidiaphragm as  seen on the prior radiograph and CT. There is no focal consolidation, pleural effusion, or pneumothorax. The cardiac silhouette is within normal limits. Moderate stool noted throughout the colon. There is no evidence of bowel obstruction or free air. No radiopaque calculi or foreign object identified. There is degenerative changes of spine. No acute fracture. IMPRESSION: Negative abdominal radiographs.  No acute cardiopulmonary disease. Electronically Signed   By: Anner Crete M.D.   On: 12/14/2015 02:18    Review of Systems  Neurological: Positive for weakness.   Blood pressure 105/79, pulse 68, temperature 97.8 F (36.6 C), temperature source Oral, resp. rate 13, height '5\' 5"'  (1.651 m), weight 162 lb (73.483 kg), SpO2 97 %. Physical Exam Alert and oriented. Skin warm and dry. Oral mucosa is moist.   . Sclera anicteric, conjunctivae is pink. Thyroid not enlarged. No cervical lymphadenopathy. Lungs clear. Heart regular rate and rhythm.  Abdomen is soft. Bowel sounds are positive. No hepatomegaly. No abdominal masses felt. No tenderness.  No edema to lower extremities.  . Assessment/Plan: Melena. PUD needs to be ruled out. I will discuss with Dr. Laural Golden.  Agree with Protonix.  SETZER,TERRI W 12/14/2015, 5:01 PM   GI attending note: Patient interviewed and examined. Patient is 75 year old American male who presents with hematemesis and melena. He is hemodynamically stable. Patient takes aspirin 325 mg on most days. He also drinks whiskey every day. He does not appear to be confused or tremulous. Patient had EGD by Dr. Gala Romney on 10/05/2015 for dyspepsia and found to have erosive reflux esophagitis, noncritical Schatzki's ring and hiatal hernia. Suspect upper GI bleed secondary to Mallory-Weiss tear but he could also have peptic ulcer disease and history of regular aspirin use. Patient will be reevaluated in a.m. prior to considering esophagogastroduodenoscopy.

## 2015-12-14 NOTE — H&P (Signed)
PCP:   No PCP Per Patient   Chief Complaint:  Vomiting coffee ground stuff  HPI: 75 yo male with h/o ugib in past prev egd showing erosive esophagitis, pt has h/o etoh abuse but no prev varices on egd comes in with several episodes of vomiting blood that looks like coffee grinds.  He denies any abdominal pain.  No fevers.  He is still actively drinking and takes no meds at home except for his chronic hiccups.  No melena or brbpr.  Pt referred for admission for his likely ugib.  Gastric content was heme neg.  Review of Systems:  Positive and negative as per HPI otherwise all other systems are negative  Past Medical History: Past Medical History  Diagnosis Date  . Hiccups    Past Surgical History  Procedure Laterality Date  . Exploratory laparotomy      Dr. Tamala Julian 2006, SBO secondary to adhesions   . Esophagogastroduodenoscopy N/A 10/05/2015    Procedure: ESOPHAGOGASTRODUODENOSCOPY (EGD);  Surgeon: Daneil Dolin, MD;  Location: [facility];  Service: Endoscopy;  Laterality: N/A;  7:30    Medications: Prior to Admission medications   Medication Sig Start Date End Date Taking? Authorizing Provider  aspirin EC 81 MG tablet Take 81 mg by mouth daily.    Historical Provider, MD  chlorproMAZINE (THORAZINE) 25 MG tablet Take 25 mg by mouth 3 (three) times daily.    Historical Provider, MD  pantoprazole (PROTONIX) 40 MG tablet Take 1 tablet (40 mg total) by mouth daily. 30 minutes before breakfast 09/28/15   Orvil Feil, NP    Allergies:  No Known Allergies  Social History:  reports that he has never smoked. He has never used smokeless tobacco. He reports that he drinks about 1.8 oz of alcohol per week. He reports that he does not use illicit drugs.  Family History: Family History  Problem Relation Age of Onset  . Colon cancer Neg Hx     Physical Exam: Filed Vitals:   12/14/15 0130 12/14/15 0230 12/14/15 0300 12/14/15 0321  BP: 137/98 128/87 140/98 140/98  Pulse: 101 108 107  107  Temp:      TempSrc:      Resp: 19 23 22    Height:      Weight:      SpO2: 98% 98% 97%    General appearance: alert, cooperative and no distress Head: Normocephalic, without obvious abnormality, atraumatic Eyes: negative Nose: Nares normal. Septum midline. Mucosa normal. No drainage or sinus tenderness. Neck: no JVD and supple, symmetrical, trachea midline Lungs: clear to auscultation bilaterally Heart: regular rate and rhythm, S1, S2 normal, no murmur, click, rub or gallop Abdomen: soft, non-tender; bowel sounds normal; no masses,  no organomegaly Extremities: extremities normal, atraumatic, no cyanosis or edema Pulses: 2+ and symmetric Skin: Skin color, texture, turgor normal. No rashes or lesions Neurologic: Grossly normal    Labs on Admission:   Recent Labs  12/13/15 2316  NA 137  K 3.9  CL 101  CO2 28  GLUCOSE 114*  BUN 24*  CREATININE 0.87  CALCIUM 8.5*    Recent Labs  12/13/15 2316  AST 22  ALT 15*  ALKPHOS 128*  BILITOT 0.9  PROT 7.7  ALBUMIN 3.9    Recent Labs  12/13/15 2316  WBC 7.5  HGB 15.6  HCT 45.6  MCV 89.6  PLT 239    Radiological Exams on Admission: Dg Abd Acute W/chest  12/14/2015  CLINICAL DATA:  75 year old male with coffee  ground emesis EXAM: DG ABDOMEN ACUTE W/ 1V CHEST COMPARISON:  Multiple radiographs dating back to 07/25/2012 and abdominal CT dated 01/29/2011 FINDINGS: There is chronic scarring of the left lung base with traction and focal elevation of the left hemidiaphragm as seen on the prior radiograph and CT. There is no focal consolidation, pleural effusion, or pneumothorax. The cardiac silhouette is within normal limits. Moderate stool noted throughout the colon. There is no evidence of bowel obstruction or free air. No radiopaque calculi or foreign object identified. There is degenerative changes of spine. No acute fracture. IMPRESSION: Negative abdominal radiographs.  No acute cardiopulmonary disease. Electronically  Signed   By: Anner Crete M.D.   On: 12/14/2015 02:18   Old records reviewed  Assessment/Plan  75 yo male with h/o etoh abuse comes in with UGIB with normal hgb  Principal Problem:   Hematemesis- GI called dr fields for egd in am.  Pt stable at this time.  protonix gtt.  No h/o varices, will hold off on octreotide at this time.  Admit to stepdown.  Keep npo.  Hold all blood thinners.  Active Problems:   Reflux esophagitis   Hiccups   ETOH abuse- ciwa protocol.  No evidence of withdrawal at this time  Admit to stepdown.  Full code.  Gi aware.  Logun Colavito A 12/14/2015, 4:32 AM

## 2015-12-14 NOTE — ED Notes (Signed)
Patient c/o vomiting. Emesis is dark brown in color. Patient states he drink alcohol heavily, and have been drinking daily for the past week.

## 2015-12-14 NOTE — ED Provider Notes (Signed)
TIME SEEN: 1:20 AM  CHIEF COMPLAINT:  Coffee-ground emesis  HPI: Pt is a 75 y.o.  Male with history of hiccups on thorazine and ETOH abuse who presents to the ED with black, coffee-ground emesis that started last night. States that he did take Pepto-Bismol but this was after his symptoms started. Denies any bloody stool or melena. No abdominal pain. No chest pain. Had an endoscopy in January 2017 which showed reflux esophagitis with no varices. He also had a Schatzki's ring and hiatal hernia. States he drinks a pint to a half a pint of alcohol every day. Last strength yesterday evening. Denies any fever. Has had some diarrhea.  ROS: See HPI Constitutional: no fever  Eyes: no drainage  ENT: no runny nose   Cardiovascular:  no chest pain  Resp: no SOB  GI:  vomiting GU: no dysuria Integumentary: no rash  Allergy: no hives  Musculoskeletal: no leg swelling  Neurological: no slurred speech ROS otherwise negative  PAST MEDICAL HISTORY/PAST SURGICAL HISTORY:  Past Medical History  Diagnosis Date  . Hiccups     MEDICATIONS:  Prior to Admission medications   Medication Sig Start Date End Date Taking? Authorizing Provider  aspirin EC 81 MG tablet Take 81 mg by mouth daily.    Historical Provider, MD  chlorproMAZINE (THORAZINE) 25 MG tablet Take 25 mg by mouth 3 (three) times daily.    Historical Provider, MD  pantoprazole (PROTONIX) 40 MG tablet Take 1 tablet (40 mg total) by mouth daily. 30 minutes before breakfast 09/28/15   Orvil Feil, NP    ALLERGIES:  No Known Allergies  SOCIAL HISTORY:  Social History  Substance Use Topics  . Smoking status: Never Smoker   . Smokeless tobacco: Never Used  . Alcohol Use: 1.8 oz/week    3 Shots of liquor per week     Comment: daily  states "I drink vodka and everything"    FAMILY HISTORY: Family History  Problem Relation Age of Onset  . Colon cancer Neg Hx     EXAM: BP 140/110 mmHg  Pulse 109  Temp(Src) 97.8 F (36.6 C) (Oral)   Resp 20  Ht 5\' 5"  (1.651 m)  Wt 162 lb (73.483 kg)  BMI 26.96 kg/m2  SpO2 96% CONSTITUTIONAL: Alert and oriented and responds appropriately to questions. Elderly, chronically ill-appearing, actively vomiting up black-appearing foul-smelling liquid HEAD: Normocephalic EYES: Conjunctivae clear, PERRL ENT: normal nose; no rhinorrhea; moist mucous membranes NECK: Supple, no meningismus, no LAD  CARD: RRR; S1 and S2 appreciated; no murmurs, no clicks, no rubs, no gallops RESP: Normal chest excursion without splinting or tachypnea; breath sounds clear and equal bilaterally; no wheezes, no rhonchi, no rales, no hypoxia or respiratory distress, speaking full sentences ABD/GI: Normal bowel sounds; non-distended; soft, non-tender, no rebound, no guarding, no peritoneal signs, no tenderness at McBurney's point, negative Murphy sign, no tenderness in epigastric region BACK:  The back appears normal and is non-tender to palpation, there is no CVA tenderness EXT: Normal ROM in all joints; non-tender to palpation; no edema; normal capillary refill; no cyanosis, no calf tenderness or swelling    SKIN: Normal color for age and race; warm; no rash NEURO: Moves all extremities equally, sensation to light touch intact diffusely, cranial nerves II through XII intact PSYCH: The patient's mood and manner are appropriate. Grooming and personal hygiene are appropriate.  MEDICAL DECISION MAKING: Patient here vomiting coffee-ground emesis. It does not look like stool but it is black. Abdominal exam is benign.  Has a history of alcohol abuse and therefore I'm concerned about bleeding ulcer, less likely varices given his endoscopy 2 months ago. We'll discuss with gastroenterology on call. We'll give IV fluids and start Protonix drip. We'll keep him NPO.    I reviewed all nursing notes, vitals, pertinent old records, labs, imaging (as available).   ED PROGRESS: 1:35 AM  D/w Dr. Oneida Alar with GI as I feel pt will need urgent  endo in AM.   Patient's hemoglobin is 15.6. His heart rate is slowly improving. Alcohol level is negative. He is not tremulous but could be withdrawing. Will obtain a CIWA score.  We'll give IV thiamine.  2:45 AM  Discussed patient's case with hospitalist, Dr. Shanon Brow.  Recommend admission to inpatient, stepdown bed.  I will place holding orders per their request. Patient and family (if present) updated with plan. Care transferred to hospitalist service.  X-ray show no free air or bowel obstruction. Abdominal exam is still benign. He is gastric occult negative but I have discussed this with hospitalist as I strongly feel that this is from bleeding and she agrees and agrees on admission.  She has seen his emesis as well in the ED.     EKG Interpretation  Date/Time:  Wednesday December 14 2015 01:45:53 EDT Ventricular Rate:  102 PR Interval:  174 QRS Duration: 110 QT Interval:  358 QTC Calculation: 466 R Axis:   -62 Text Interpretation:  Sinus tachycardia Left anterior fascicular block RSR' in V1 or V2, right VCD or RVH Left ventricular hypertrophy No significant change since last tracing Confirmed by WARD,  DO, KRISTEN [id number]) on 12/14/2015 2:42:38 AM        CRITICAL CARE Performed by: Nyra Jabs   Total critical care time: 45 minutes  Critical care time was exclusive of separately billable procedures and treating other patients.  Critical care was necessary to treat or prevent imminent or life-threatening deterioration.  Critical care was time spent personally by me on the following activities: development of treatment plan with patient and/or surrogate as well as nursing, discussions with consultants, evaluation of patient's response to treatment, examination of patient, obtaining history from patient or surrogate, ordering and performing treatments and interventions, ordering and review of laboratory studies, ordering and review of radiographic studies, pulse oximetry and  re-evaluation of patient's condition.    [place], DO 12/14/15 [phone]

## 2015-12-14 NOTE — ED Notes (Signed)
Pt given clear liquid dinner tray. Pt tolerating well at this time.

## 2015-12-15 ENCOUNTER — Encounter (HOSPITAL_COMMUNITY): Payer: Self-pay | Admitting: *Deleted

## 2015-12-15 ENCOUNTER — Encounter (HOSPITAL_COMMUNITY)
Admission: EM | Disposition: A | Discharge status: Home / Self Care | Payer: Self-pay | Source: Home / Self Care | Attending: Emergency Medicine

## 2015-12-15 DIAGNOSIS — K2210 Ulcer of esophagus without bleeding: Secondary | ICD-10-CM | POA: Diagnosis not present

## 2015-12-15 DIAGNOSIS — K210 Gastro-esophageal reflux disease with esophagitis: Secondary | ICD-10-CM

## 2015-12-15 DIAGNOSIS — K921 Melena: Secondary | ICD-10-CM | POA: Insufficient documentation

## 2015-12-15 DIAGNOSIS — K920 Hematemesis: Secondary | ICD-10-CM

## 2015-12-15 DIAGNOSIS — K449 Diaphragmatic hernia without obstruction or gangrene: Secondary | ICD-10-CM | POA: Diagnosis not present

## 2015-12-15 DIAGNOSIS — R110 Nausea: Secondary | ICD-10-CM | POA: Diagnosis not present

## 2015-12-15 DIAGNOSIS — F1010 Alcohol abuse, uncomplicated: Secondary | ICD-10-CM

## 2015-12-15 DIAGNOSIS — K922 Gastrointestinal hemorrhage, unspecified: Secondary | ICD-10-CM | POA: Diagnosis present

## 2015-12-15 HISTORY — PX: ESOPHAGOGASTRODUODENOSCOPY: SHX5428

## 2015-12-15 LAB — BASIC METABOLIC PANEL
Anion gap: 7 (ref 5–15)
BUN: 12 mg/dL (ref 6–20)
CALCIUM: 7.7 mg/dL — AB (ref 8.9–10.3)
CO2: 25 mmol/L (ref 22–32)
CREATININE: 0.79 mg/dL (ref 0.61–1.24)
Chloride: 107 mmol/L (ref 101–111)
GFR calc Af Amer: >60 mL/min (ref >60)
GLUCOSE: 85 mg/dL (ref 65–99)
Potassium: 3.8 mmol/L (ref 3.5–5.1)
Sodium: 139 mmol/L (ref 135–145)

## 2015-12-15 LAB — HEMOGLOBIN AND HEMATOCRIT, BLOOD
HEMATOCRIT: 37.2 % — AB (ref 39.0–52.0)
HEMOGLOBIN: 12.3 g/dL — AB (ref 13.0–17.0)

## 2015-12-15 SURGERY — EGD (ESOPHAGOGASTRODUODENOSCOPY)
Anesthesia: Moderate Sedation

## 2015-12-15 MED ORDER — PANTOPRAZOLE SODIUM 40 MG IV SOLR
40.0000 mg | Freq: Two times a day (BID) | INTRAVENOUS | Status: DC
  Administered 2015-12-15 – 2015-12-16 (×2): 40 mg via INTRAVENOUS
  Filled 2015-12-15 (×2): qty 40

## 2015-12-15 MED ORDER — MEPERIDINE HCL 100 MG/ML IJ SOLN
INTRAMUSCULAR | Status: DC | PRN
  Administered 2015-12-15 (×2): 50 mg via INTRAVENOUS

## 2015-12-15 MED ORDER — MIDAZOLAM HCL 5 MG/5ML IJ SOLN
INTRAMUSCULAR | Status: AC
  Filled 2015-12-15: qty 10

## 2015-12-15 MED ORDER — MIDAZOLAM HCL 5 MG/5ML IJ SOLN
INTRAMUSCULAR | Status: DC | PRN
  Administered 2015-12-15 (×2): 2 mg via INTRAVENOUS

## 2015-12-15 MED ORDER — SODIUM CHLORIDE 0.9 % IV SOLN: INTRAVENOUS | Status: DC

## 2015-12-15 MED ORDER — MEPERIDINE HCL 100 MG/ML IJ SOLN
INTRAMUSCULAR | Status: AC
  Filled 2015-12-15: qty 2

## 2015-12-15 MED ORDER — LIDOCAINE VISCOUS 2 % MT SOLN
OROMUCOSAL | Status: DC | PRN
  Administered 2015-12-15: 1 via OROMUCOSAL

## 2015-12-15 MED ORDER — ONDANSETRON HCL 4 MG/2ML IJ SOLN
INTRAMUSCULAR | Status: DC | PRN
  Administered 2015-12-15: 4 mg via INTRAVENOUS

## 2015-12-15 MED ORDER — LIDOCAINE VISCOUS 2 % MT SOLN
OROMUCOSAL | Status: AC
  Filled 2015-12-15: qty 15

## 2015-12-15 MED ORDER — ONDANSETRON HCL 4 MG/2ML IJ SOLN
INTRAMUSCULAR | Status: AC
  Filled 2015-12-15: qty 2

## 2015-12-15 NOTE — Progress Notes (Signed)
[facility] PROGRESS NOTE  Joshua Mckee [id number] DOB: 07-Feb-1941 DOA: 12/14/2015 PCP: No PCP Per Patient  Assessment/Plan: 1-Coffee Ground Emesis.  Continue with IV Protonix.  Hb stable.  GI consulted and following.  IV fluids.  Clear diet , no plan for endoscopy at this time.   2-Alcohol use;  Continue with CIWA protocol Per family patient doesn drink every day.   Code Status: Full code.  Family Communication none at bedside.  Disposition Plan: Remain inpatient.    Consultants:  GI  Procedures:  none  Antibiotics:  none  HPI/Subjective: No further vomiting, denies abdominal pain   Objective: Filed Vitals:   12/15/15 0630 12/15/15 0700  BP: 125/94 123/91  Pulse: 71 68  Temp:    Resp: 20 11   No intake or output data in the 24 hours ending 12/15/15 0923 Filed Weights   12/13/15 2300  Weight: 73.483 kg (162 lb)    Exam:   General:  NAD  Cardiovascular: S 1, S 2 RRR  Respiratory: CTA  Abdomen: BS present, soft, nt  Musculoskeletal: no edema  Data Reviewed: Basic Metabolic Panel:  Recent Labs Lab 12/13/15 2316 12/14/15 0539 12/15/15 0327  NA 137  --  139  K 3.9  --  3.8  CL 101  --  107  CO2 28  --  25  GLUCOSE 114*  --  85  BUN 24*  --  12  CREATININE 0.87  --  0.79  CALCIUM 8.5*  --  7.7*  MG  --  1.8  --    Liver Function Tests:  Recent Labs Lab 12/13/15 2316  AST 22  ALT 15*  ALKPHOS 128*  BILITOT 0.9  PROT 7.7  ALBUMIN 3.9   No results for input(s): LIPASE, AMYLASE in the last 168 hours. No results for input(s): AMMONIA in the last 168 hours. CBC:  Recent Labs Lab 12/13/15 2316 12/14/15 0539 12/14/15 1640 12/15/15 0327  WBC 7.5  --   --   --   HGB 15.6 12.2* 12.6* 12.3*  HCT 45.6 37.9* 38.4* 37.2*  MCV 89.6  --   --   --   PLT 239  --   --   --    Cardiac Enzymes: No results for input(s): CKTOTAL, CKMB, CKMBINDEX, TROPONINI in the last 168 hours. BNP (last 3 results) No results for input(s):  BNP in the last 8760 hours.  ProBNP (last 3 results) No results for input(s): PROBNP in the last 8760 hours.  CBG: No results for input(s): GLUCAP in the last 168 hours.  No results found for this or any previous visit (from the past 240 hour(s)).   Studies: Dg Abd Acute W/chest  12/14/2015  CLINICAL DATA:  75 year old male with coffee ground emesis EXAM: DG ABDOMEN ACUTE W/ 1V CHEST COMPARISON:  Multiple radiographs dating back to 07/25/2012 and abdominal CT dated 01/29/2011 FINDINGS: There is chronic scarring of the left lung base with traction and focal elevation of the left hemidiaphragm as seen on the prior radiograph and CT. There is no focal consolidation, pleural effusion, or pneumothorax. The cardiac silhouette is within normal limits. Moderate stool noted throughout the colon. There is no evidence of bowel obstruction or free air. No radiopaque calculi or foreign object identified. There is degenerative changes of spine. No acute fracture. IMPRESSION: Negative abdominal radiographs.  No acute cardiopulmonary disease. Electronically Signed   By: Anner Crete M.D.   On: 12/14/2015 02:18    Scheduled Meds: . folic acid  1 mg Oral Daily  . multivitamin with minerals  1 tablet Oral Daily  . sodium chloride flush  3 mL Intravenous Q12H  . thiamine  100 mg Oral Daily   Or  . thiamine  100 mg Intravenous Daily   Continuous Infusions: . sodium chloride    . sodium chloride 75 mL/hr at 12/14/15 1404  . pantoprozole (PROTONIX) infusion 8 mg/hr (12/14/15 2258)    Principal Problem:   Hematemesis Active Problems:   Reflux esophagitis   Hiccups   ETOH abuse   GI bleed    Time spent: 25 minutes.     Niel Hummer A  [facility] Pager [phone]. If 7PM-7AM, please contact night-coverage at [email or web address], password [facility] 12/15/2015, 9:23 AM  LOS: 1 day

## 2015-12-15 NOTE — Progress Notes (Signed)
Tolerated liquids with no difficulty swallowing, oxygen removed and O2 Sat remained 100%.  Will continue to monitor.

## 2015-12-15 NOTE — Progress Notes (Signed)
Report from ED.  Arrived to room 324, NPO status for upper endo later today.

## 2015-12-15 NOTE — Op Note (Signed)
[facility] Patient Name: Joshua Mckee Procedure Date: 12/15/2015 3:36 PM MRN: [id number] Date of Birth: [birth year] Attending MD: Norvel Richards , MD CSN: [id number] Age: 75 Admit Type: Inpatient Procedure:                Upper GI endoscopy Indications:              Hematemesis, Melena Providers:                Norvel Richards, MD, Lurline Del, RN, Georgeann Oppenheim, Technician Referring MD:              Medicines:                Midazolam 4 mg IV, Meperidine 100 mg IV,                            Ondansetron 4 mg IV Complications:            No immediate complications. Estimated Blood Loss:     Estimated blood loss: none. Estimated blood loss:                            none. Procedure:                Pre-Anesthesia Assessment:                           - Prior to the procedure, a History and Physical                            was performed, and patient medications and                            allergies were reviewed. The patient's tolerance of                            previous anesthesia was also reviewed. The risks                            and benefits of the procedure and the sedation                            options and risks were discussed with the patient.                            All questions were answered, and informed consent                            was obtained. Prior Anticoagulants: The patient has                            taken no previous anticoagulant or antiplatelet                            agents.  ASA Grade Assessment: II - A patient with                            mild systemic disease. After reviewing the risks                            and benefits, the patient was deemed in                            satisfactory condition to undergo the procedure.                           After obtaining informed consent, the endoscope was                            passed under direct vision. Throughout the                  procedure, the patient's blood pressure, pulse, and                            oxygen saturations were monitored continuously. The                            [id number] scope was introduced through the                            mouth, and advanced to the second part of duodenum.                            The upper GI endoscopy was accomplished without                            difficulty. The patient tolerated the procedure                            well. Scope In: 3:49:27 PM Scope Out: 3:53:14 PM Total Procedure Duration: 0 hours 3 minutes 47 seconds  Findings:      (1) linear esophageal ulcers with no bleeding and stigmata of recent       bleeding were found 37 cm from the incisors. The lesion was 5 mm in       largest dimension.      The exam of the esophagus was otherwise normal.      A small hiatal hernia was present.      The exam of the stomach was otherwise normal.      The second portion of the duodenum was normal. Impression:               - Non-bleeding esophageal ulcer. likely related to                            reflux. May be an element of a Mallory-Weiss tear                            in the setting of ongoing  alcohol abuse                           - Small hiatal hernia.                           - Normal second portion of the duodenum.                           - No specimens collected. Moderate Sedation:      Moderate (conscious) sedation was administered by the endoscopy nurse       and supervised by the endoscopist. The following parameters were       monitored: oxygen saturation, heart rate, blood pressure, respiratory       rate, EKG, adequacy of pulmonary ventilation, and response to care.       Total physician intraservice time was 15 minutes.      Moderate (conscious) sedation was administered by the endoscopy nurse       and supervised by the endoscopist. The following parameters were       monitored: oxygen saturation, heart  rate, blood pressure, respiratory       rate, EKG, adequacy of pulmonary ventilation, and response to care.       Total physician intraservice time was 13 minutes. Recommendation:           - Patient has a contact number available for                            emergencies. The signs and symptoms of potential                            delayed complications were discussed with the                            patient. Return to normal activities tomorrow.                            Written discharge instructions were provided to the                            patient.                           - Advance diet as tolerated today.                           - Continue present medications. May discontinue                            Protonix drip and begin Protonix 40 mg twice daily.                            Patient strongly admonished to completely abstain                            from alcohol moving forward                           -  Repeat upper endoscopy.                           - Return to GI office in 1 month. Procedure Code(s):        --- Professional ---                           [phone], Esophagogastroduodenoscopy, flexible,                            transoral; diagnostic, including collection of                            specimen(s) by brushing or washing, when performed                            (separate procedure)                           99152, Moderate sedation services provided by the                            same physician or other qualified health care                            professional performing the diagnostic or                            therapeutic service that the sedation supports,                            requiring the presence of an independent trained                            observer to assist in the monitoring of the                            patient's level of consciousness and physiological                            status; initial 15 minutes  of intraservice time,                            patient age 58 years or older                           [phone], Moderate sedation services provided by the                            same physician or other qualified health care                            professional performing the diagnostic or  therapeutic service that the sedation supports,                            requiring the presence of an independent trained                            observer to assist in the monitoring of the                            patient's level of consciousness and physiological                            status; initial 15 minutes of intraservice time,                            patient age 66 years or older Diagnosis Code(s):        --- Professional ---                           K22.10, Ulcer of esophagus without bleeding                           K44.9, Diaphragmatic hernia without obstruction or                            gangrene                           K92.0, Hematemesis                           K92.1, Melena (includes Hematochezia) CPT copyright 2016 American Medical Association. All rights reserved. The codes documented in this report are preliminary and upon coder review may  be revised to meet current compliance requirements. Cristopher Estimable. Iori Gigante, MD Norvel Richards, MD 12/15/2015 4:08:46 PM This report has been signed electronically. Number of Addenda: 0

## 2015-12-15 NOTE — Progress Notes (Signed)
Subjective:  75 year old gentleman with history of alcohol abuse, EGD back in January for hematemesis showed erosive reflux esophagitis. Patient has been on aspirin. Possibly not taking his Protonix anymore. He is not sure. Presented to the emergency department late on March 28 with complaints of black, coffee-ground emesis. Reportedly had several episodes of retching prior to seeing this. Notes that he increased his alcohol intake recently. Drinking 1-2 "throwaway bottles" of whiskey daily. Worse in the last 2 weeks. He also reports melena couple of days ago. He has been in the ER greater than 24 hours waiting for bed. Noted to be very stable. No overt GI bleeding in the ER.  Objective: Vital signs in last 24 hours: Pulse Rate:  [59-92] 68 (03/30 0700) Resp:  [11-26] 11 (03/30 0700) BP: (101-149)/(72-100) 123/91 mmHg (03/30 0700) SpO2:  [94 %-100 %] 98 % (03/30 0700) Last BM Date: 12/14/15 General:   Alert,  Well-developed, well-nourished, pleasant and cooperative in NAD Head:  Normocephalic and atraumatic. Eyes:  Sclera clear, no icterus.  Chest: CTA bilaterally without rales, rhonchi, crackles.    Heart:  Regular rate and rhythm; no murmurs, clicks, rubs,  or gallops. Abdomen:  Soft, nontender and nondistended. No masses, hepatosplenomegaly or hernias noted. Normal bowel sounds, without guarding, and without rebound.   Extremities:  Without clubbing, deformity or edema. Neurologic:  Alert and  oriented x4;  grossly normal neurologically. Skin:  Intact without significant lesions or rashes. Psych:  Alert and cooperative. Normal mood and affect.  Intake/Output from previous day:   Intake/Output this shift:    Lab Results: CBC  Recent Labs  12/13/15 2316 12/14/15 0539 12/14/15 1640 12/15/15 0327  WBC 7.5  --   --   --   HGB 15.6 12.2* 12.6* 12.3*  HCT 45.6 37.9* 38.4* 37.2*  MCV 89.6  --   --   --   PLT 239  --   --   --    BMET  Recent Labs  12/13/15 2316  12/15/15 0327  NA 137 139  K 3.9 3.8  CL 101 107  CO2 28 25  GLUCOSE 114* 85  BUN 24* 12  CREATININE 0.87 0.79  CALCIUM 8.5* 7.7*   LFTs  Recent Labs  12/13/15 2316  BILITOT 0.9  ALKPHOS 128*  AST 22  ALT 15*  PROT 7.7  ALBUMIN 3.9   No results for input(s): LIPASE in the last 72 hours. PT/INR  Recent Labs  12/13/15 2316  LABPROT 14.3  INR 1.09      Imaging Studies: Dg Abd Acute W/chest  12/14/2015  CLINICAL DATA:  75 year old male with coffee ground emesis EXAM: DG ABDOMEN ACUTE W/ 1V CHEST COMPARISON:  Multiple radiographs dating back to 07/25/2012 and abdominal CT dated 01/29/2011 FINDINGS: There is chronic scarring of the left lung base with traction and focal elevation of the left hemidiaphragm as seen on the prior radiograph and CT. There is no focal consolidation, pleural effusion, or pneumothorax. The cardiac silhouette is within normal limits. Moderate stool noted throughout the colon. There is no evidence of bowel obstruction or free air. No radiopaque calculi or foreign object identified. There is degenerative changes of spine. No acute fracture. IMPRESSION: Negative abdominal radiographs.  No acute cardiopulmonary disease. Electronically Signed   By: Anner Crete M.D.   On: 12/14/2015 02:18  [2 weeks]   Assessment: 75 year old gentleman with hematemesis back in January, EGD showed erosive reflux esophagitis. He presents back with recurrent hematemesis, episode of melena. This is in  the setting of aspirin 325 mg daily, increased whiskey consumption. Suspect Mallory-Weiss tear plus or minus erosive reflux esophagitis. In addition it is unclear that he is actually on his PPI at this time. Patient is not well versed in what medications he is taking. Since being in the ER he has dropped his hemoglobin from 15 down to 12 in the setting of hydration. There has been no further overt GI bleeding.    Plan: 1. Discussed with Dr. Gala Romney. Plan on EGD today with conscious  sedation (he did very well before). Patient to go back to the ER/bed hold post EGD.  I have discussed the risks, alternatives, benefits with regards to but not limited to the risk of reaction to medication, bleeding, infection, perforation and the patient is agreeable to proceed. Written consent to be obtained.  Laureen Ochs. Bernarda Caffey [facility] [phone] 3/30/20179:37 AM      LOS: 1 day    Addendum: patient's girlfriend went home and got patient's "stomach medication". He IS taking pantoprazole daily at this time. Had it refilled two days ago.   Laureen Ochs. Bernarda Caffey [facility] [phone] 3/30/201710:31 AM

## 2015-12-15 NOTE — Progress Notes (Signed)
Down via stretcher to endo for EGD, IV protonix and saline infusing via pump.  Significant other accompanied patient.

## 2015-12-15 NOTE — ED Notes (Signed)
This nurse made a call to Endo at this time to enquire about when his Endo procedure will take place and the nurse for Endo stated the patient is not on the list and I explained to Endo if she will talk with a doctor that is on call today for Endo bc the pt has been NPO for 35+ hours now and Endo needs to be scheduled or the patient NPO status needs to be d/c. Dr. Lucianne Muss in Endo stated he would talk to Dr. Sydell Axon and have the Endo nurse call me back in the ED about scheduling.

## 2015-12-15 NOTE — ED Notes (Signed)
Report given to Santiago Glad, RN for room 324.

## 2015-12-15 NOTE — Progress Notes (Signed)
Report received from endo, patient sleepy and placed on O2 at 2L until alert and awake.  Throat sprayed with viscous lidocaine and will hold liquids until after 5PM  Per endo nurse.  Vitals taken.  Will continue to monitor.

## 2015-12-16 ENCOUNTER — Telehealth: Payer: Self-pay | Admitting: Nurse Practitioner

## 2015-12-16 DIAGNOSIS — K920 Hematemesis: Secondary | ICD-10-CM | POA: Insufficient documentation

## 2015-12-16 DIAGNOSIS — R110 Nausea: Secondary | ICD-10-CM

## 2015-12-16 DIAGNOSIS — K210 Gastro-esophageal reflux disease with esophagitis: Secondary | ICD-10-CM | POA: Diagnosis not present

## 2015-12-16 DIAGNOSIS — F1010 Alcohol abuse, uncomplicated: Secondary | ICD-10-CM | POA: Diagnosis not present

## 2015-12-16 DIAGNOSIS — K921 Melena: Secondary | ICD-10-CM | POA: Diagnosis not present

## 2015-12-16 LAB — BASIC METABOLIC PANEL
ANION GAP: 6 (ref 5–15)
BUN: 10 mg/dL (ref 6–20)
CHLORIDE: 104 mmol/L (ref 101–111)
CO2: 28 mmol/L (ref 22–32)
Calcium: 7.7 mg/dL — ABNORMAL LOW (ref 8.9–10.3)
Creatinine, Ser: 0.8 mg/dL (ref 0.61–1.24)
GFR calc non Af Amer: >60 mL/min (ref >60)
Glucose, Bld: 87 mg/dL (ref 65–99)
POTASSIUM: 4 mmol/L (ref 3.5–5.1)
SODIUM: 138 mmol/L (ref 135–145)

## 2015-12-16 LAB — CBC
HCT: 36.4 % — ABNORMAL LOW (ref 39.0–52.0)
HEMOGLOBIN: 12 g/dL — AB (ref 13.0–17.0)
MCH: 29.6 pg (ref 26.0–34.0)
MCHC: 33 g/dL (ref 30.0–36.0)
MCV: 89.9 fL (ref 78.0–100.0)
Platelets: 172 10*3/uL (ref 150–400)
RBC: 4.05 MIL/uL — AB (ref 4.22–5.81)
RDW: 13.5 % (ref 11.5–15.5)
WBC: 5.9 10*3/uL (ref 4.0–10.5)

## 2015-12-16 MED ORDER — FOLIC ACID 1 MG PO TABS: 1.0000 mg | ORAL_TABLET | Freq: Every day | ORAL | Status: DC

## 2015-12-16 MED ORDER — ADULT MULTIVITAMIN W/MINERALS CH: 1.0000 | ORAL_TABLET | Freq: Every day | ORAL | Status: DC

## 2015-12-16 MED ORDER — CALCIUM CARBONATE 1250 (500 CA) MG PO TABS: 1.0000 | ORAL_TABLET | Freq: Two times a day (BID) | ORAL | Status: DC

## 2015-12-16 MED ORDER — PANTOPRAZOLE SODIUM 40 MG PO TBEC: 40.0000 mg | DELAYED_RELEASE_TABLET | Freq: Every day | ORAL | Status: DC

## 2015-12-16 MED ORDER — PANTOPRAZOLE SODIUM 40 MG PO TBEC: 40.0000 mg | DELAYED_RELEASE_TABLET | Freq: Two times a day (BID) | ORAL | Status: DC

## 2015-12-16 NOTE — Care Management Note (Signed)
Case Management Note  Patient Details  Name: Joshua Mckee MRN: [id number] Date of Birth: [birth year]  Subjective/Objective:      Spoke with patient who is alert and oriented from home. Stated that they can afford medications and are able to make thier medical appointments without difficulty. No DME or O2 at home. No CM needs identified.              Action/Plan: Home with self care.  Expected Discharge Date:  12/16/15               Expected Discharge Plan:  Home/Self Care  In-House Referral:     Discharge planning Services  CM Consult  Post Acute Care Choice:    Choice offered to:     DME Arranged:    DME Agency:     HH Arranged:    HH Agency:     Status of Service:  Completed, signed off  Medicare Important Message Given:  Yes Date Medicare IM Given:    Medicare IM give by:    Date Additional Medicare IM Given:    Additional Medicare Important Message give by:     If discussed at [place] of Stay Meetings, dates discussed:    Additional Comments:  Alvie Heidelberg, RN 12/16/2015, 1:25 PM

## 2015-12-16 NOTE — Progress Notes (Signed)
Ptr tolerated lunch without pain of nausea

## 2015-12-16 NOTE — Progress Notes (Signed)
REVIEWED-NO ADDITIONAL RECOMMENDATIONS.  Subjective: Doing well today, no further N/V, no melena noted since Tuesday. Denies abdominal pain, tolerating diet well. States he will never drink again. No other GI complaints.  Objective: Vital signs in last 24 hours: Temp:  [97.4 F (36.3 C)-98.4 F (36.9 C)] 97.8 F (36.6 C) (03/31 0651) Pulse Rate:  [68-120] 75 (03/31 0651) Resp:  [13-21] 20 (03/31 0651) BP: (99-156)/(76-107) 134/92 mmHg (03/31 0651) SpO2:  [90 %-100 %] 100 % (03/31 0651) Last BM Date: 12/15/15 General:   Alert and oriented, pleasant Head:  Normocephalic and atraumatic. Eyes:  No icterus, sclera clear. Conjuctiva pink.  Heart:  S1, S2 present, no murmurs noted.  Lungs: Clear to auscultation bilaterally, without wheezing, rales, or rhonchi.  Abdomen:  Bowel sounds present, soft, non-tender, non-distended. No rebound or guarding. Neurologic:  Alert and  oriented x4;  grossly normal neurologically. Skin:  Warm and dry, intact without significant lesions.  Psych:  Alert and cooperative. Normal mood and affect.  Intake/Output from previous day: 03/30 0701 - 03/31 0700 In: 240 [P.O.:240] Out: 500 [Urine:500] Intake/Output this shift:    Lab Results:  Recent Labs  12/13/15 2316  12/14/15 1640 12/15/15 0327 12/16/15 0633  WBC 7.5  --   --   --  5.9  HGB 15.6  < > 12.6* 12.3* 12.0*  HCT 45.6  < > 38.4* 37.2* 36.4*  PLT 239  --   --   --  172  < > = values in this interval not displayed. BMET  Recent Labs  12/13/15 2316 12/15/15 0327 12/16/15 0633  NA 137 139 138  K 3.9 3.8 4.0  CL 101 107 104  CO2 28 25 28   GLUCOSE 114* 85 87  BUN 24* 12 10  CREATININE 0.87 0.79 0.80  CALCIUM 8.5* 7.7* 7.7*   LFT  Recent Labs  12/13/15 2316  PROT 7.7  ALBUMIN 3.9  AST 22  ALT 15*  ALKPHOS 128*  BILITOT 0.9   PT/INR  Recent Labs  12/13/15 2316  LABPROT 14.3  INR 1.09   Hepatitis Panel No results for input(s): HEPBSAG, HCVAB, HEPAIGM, HEPBIGM in  the last 72 hours.   Studies/Results: No results found.  Assessment: 75 year old gentleman with hematemesis back in January, EGD showed erosive reflux esophagitis. He presented back with recurrent hematemesis, episode of melena. This is in the setting of aspirin 325 mg daily, increased whiskey consumption. In addition it is unclear that he is actually on his PPI at this time. Patient is not well versed in what medications he is taking. In the ER his hemoglobin dropped from 15 down to 12 in the setting of hydration. There was been no further overt GI bleeding while in the ER pre-procedure.  EGD completed yesterday found non-bleeding linear esophageal ulcer likely related to reflux and/or element of Mallory-Weiss tear with ongoing ETOH abuse, small hiatal hernia, normal D2. Recommended advance diet as tolerated. His PPI was changed to 40 mg bid, strongly recommended alcohol cessation. Will need outpatient follow-up in 1 month and repeat EGD.  Today his Hgb remains stable at 12.0, platelets normal. BMP normal except persistent low Ca since admission. Clinically improved today, no further bleeding, no new melena noted. Tolerating diet.  Plan: 1. 1 month outpatient follow-up with our office 2. Continue PPI on discharge 3. Absolutely abstain from ETOH   Walden Field, AGNP-C Adult & Gerontological Nurse Practitioner [facility]    LOS: 2 days    12/16/2015, 8:33 AM

## 2015-12-16 NOTE — Care Management Important Message (Signed)
Important Message  Patient Details  Name: Joshua Mckee MRN: [id number] Date of Birth: 1941/03/02   Medicare Important Message Given:  Yes    Alvie Heidelberg, RN 12/16/2015, 8:36 AM

## 2015-12-16 NOTE — Discharge Summary (Addendum)
Physician Discharge Summary  Joshua Mckee [id number] DOB: 1941/06/13 DOA: 12/14/2015  PCP: No PCP Per Patient  Admit date: 12/14/2015 Discharge date: 12/16/2015  Time spent: 35 minutes  Recommendations for Outpatient Follow-up:  Needs cbc to follow hb.  Consider resuming low dose aspirin for secondary prevention for CAD and stroke if hb stable.  Needs to follow up with GI for surveillance endoscopy   Discharge Diagnoses:    Non-bleeding linear esophageal ulcer likely related to reflux and/or element of Mallory-Weiss tear    Hematemesis with nausea , maybe related to mallory weiss tear    Reflux esophagitis   Hiccups   ETOH abuse   GI bleed    Discharge Condition: stable  Diet recommendation: heart healthy   Filed Weights   12/13/15 2300  Weight: 73.483 kg (162 lb)    History of present illness:  75 yo male with h/o ugib in past prev egd showing erosive esophagitis, pt has h/o etoh abuse but no prev varices on egd comes in with several episodes of vomiting blood that looks like coffee grinds. He denies any abdominal pain. No fevers. He is still actively drinking and takes no meds at home except for his chronic hiccups. No melena or brbpr. Pt referred for admission for his likely ugib. Gastric content was heme neg.  Hospital Course:  1-Coffee Ground Emesis. Hematemesis, GI bleed. May be related to [facility] tear Endoscopy; found non-bleeding linear esophageal ulcer likely related to reflux and/or element of Mallory-Weiss tear with ongoing ETOH abuse, small hiatal hernia, normal D2.  Continue with Protonix at discharge Hb stable.  GI consulted and following.  Tolerating regular diet, plan to discharge today.  Advised to stop drinking alcohol.  Hold aspirin at discharge, discussed with GI.  Patient improved faster than expected.   2-Alcohol use;  Continue with CIWA protocol Per family patient doesn drink every day.  No evidence of withdrawal.    Procedures:  Endoscopy   Consultations:  GI  Discharge Exam: Filed Vitals:   12/15/15 2214 12/16/15 0651  BP: 127/84 134/92  Pulse: 81 75  Temp: 97.4 F (36.3 C) 97.8 F (36.6 C)  Resp: 20 20    General: Alert in no acute distress Cardiovascular: S 1, S 2 RRR Respiratory: CTA  Discharge Instructions   Discharge Instructions    Diet - low sodium heart healthy    Complete by:  As directed      Increase activity slowly    Complete by:  As directed           Discharge Medication List as of 12/16/2015  2:30 PM    START taking these medications   Details  calcium carbonate (OS-CAL - DOSED IN MG OF ELEMENTAL CALCIUM) 1250 (500 Ca) MG tablet Take 1 tablet (500 mg of elemental calcium total) by mouth 2 (two) times daily with a meal., Starting 12/16/2015, Until Discontinued, Print    folic acid (FOLVITE) 1 MG tablet Take 1 tablet (1 mg total) by mouth daily., Starting 12/16/2015, Until Discontinued, Print    Multiple Vitamin (MULTIVITAMIN WITH MINERALS) TABS tablet Take 1 tablet by mouth daily., Starting 12/16/2015, Until Discontinued, Print      CONTINUE these medications which have CHANGED   Details  pantoprazole (PROTONIX) 40 MG tablet Take 1 tablet (40 mg total) by mouth 2 (two) times daily. 30 minutes before breakfast, Starting 12/16/2015, Until Discontinued, Print      STOP taking these medications     aspirin 325 MG tablet  No Known Allergies Follow-up Information    Follow up with Manus Rudd, MD In 1 month.   Specialty:  Gastroenterology   Contact information:   [address] [place] [address] [phone]        The results of significant diagnostics from this hospitalization (including imaging, microbiology, ancillary and laboratory) are listed below for reference.    Significant Diagnostic Studies: Dg Abd Acute W/chest  12/14/2015  CLINICAL DATA:  75 year old male with coffee ground emesis EXAM: DG ABDOMEN ACUTE W/ 1V CHEST  COMPARISON:  Multiple radiographs dating back to 07/25/2012 and abdominal CT dated 01/29/2011 FINDINGS: There is chronic scarring of the left lung base with traction and focal elevation of the left hemidiaphragm as seen on the prior radiograph and CT. There is no focal consolidation, pleural effusion, or pneumothorax. The cardiac silhouette is within normal limits. Moderate stool noted throughout the colon. There is no evidence of bowel obstruction or free air. No radiopaque calculi or foreign object identified. There is degenerative changes of spine. No acute fracture. IMPRESSION: Negative abdominal radiographs.  No acute cardiopulmonary disease. Electronically Signed   By: Anner Crete M.D.   On: 12/14/2015 02:18    Microbiology: No results found for this or any previous visit (from the past 240 hour(s)).   Labs: Basic Metabolic Panel:  Recent Labs Lab 12/13/15 2316 12/14/15 0539 12/15/15 0327 12/16/15 0633  NA 137  --  139 138  K 3.9  --  3.8 4.0  CL 101  --  107 104  CO2 28  --  25 28  GLUCOSE 114*  --  85 87  BUN 24*  --  12 10  CREATININE 0.87  --  0.79 0.80  CALCIUM 8.5*  --  7.7* 7.7*  MG  --  1.8  --   --    Liver Function Tests:  Recent Labs Lab 12/13/15 2316  AST 22  ALT 15*  ALKPHOS 128*  BILITOT 0.9  PROT 7.7  ALBUMIN 3.9   No results for input(s): LIPASE, AMYLASE in the last 168 hours. No results for input(s): AMMONIA in the last 168 hours. CBC:  Recent Labs Lab 12/13/15 2316 12/14/15 0539 12/14/15 1640 12/15/15 0327 12/16/15 0633  WBC 7.5  --   --   --  5.9  HGB 15.6 12.2* 12.6* 12.3* 12.0*  HCT 45.6 37.9* 38.4* 37.2* 36.4*  MCV 89.6  --   --   --  89.9  PLT 239  --   --   --  172   Cardiac Enzymes: No results for input(s): CKTOTAL, CKMB, CKMBINDEX, TROPONINI in the last 168 hours. BNP: BNP (last 3 results) No results for input(s): BNP in the last 8760 hours.  ProBNP (last 3 results) No results for input(s): PROBNP in the last 8760  hours.  CBG: No results for input(s): GLUCAP in the last 168 hours.     Signed:  Elmarie Shiley MD.  [facility] 12/16/2015, 3:05 PM

## 2015-12-16 NOTE — Progress Notes (Signed)
Discharge instructions read to patient and his girlfriend. Both verbalized understanding.  Discharged to home with girlfriend

## 2015-12-16 NOTE — Telephone Encounter (Signed)
Please schedule patient for 1 month follow-up for hospital f/u, schedule surveillance EGD.

## 2015-12-19 ENCOUNTER — Encounter (HOSPITAL_COMMUNITY): Payer: Self-pay | Admitting: Internal Medicine

## 2015-12-19 NOTE — Telephone Encounter (Signed)
PATIENT ALREADY ON SCHEDULE FOR April 12

## 2015-12-20 ENCOUNTER — Ambulatory Visit: Payer: BLUE CROSS/BLUE SHIELD | Admitting: Orthopaedic Surgery

## 2015-12-20 ENCOUNTER — Encounter: Payer: Self-pay | Admitting: Orthopaedic Surgery

## 2015-12-28 ENCOUNTER — Ambulatory Visit (INDEPENDENT_AMBULATORY_CARE_PROVIDER_SITE_OTHER): Payer: BLUE CROSS/BLUE SHIELD | Admitting: Gastroenterology

## 2015-12-28 ENCOUNTER — Encounter: Payer: Self-pay | Admitting: Gastroenterology

## 2015-12-28 VITALS — BP 136/88 | HR 75 | Temp 97.7°F | Ht 66.0 in | Wt 159.4 lb

## 2015-12-28 DIAGNOSIS — K2210 Ulcer of esophagus without bleeding: Secondary | ICD-10-CM

## 2015-12-28 DIAGNOSIS — K2100 Gastro-esophageal reflux disease with esophagitis, without bleeding: Secondary | ICD-10-CM

## 2015-12-28 NOTE — Progress Notes (Signed)
    Referring Provider: No ref. provider found Primary Care Physician:  No PCP Per Patient  Primary GI: Dr. Gala Romney   Chief Complaint  Patient presents with  . Abdominal Pain    feeling better     HPI:   Joshua Mckee is a 75 y.o. male presenting today with a history of erosive esophagitis, presenting in March 2017 to [facility] with hematemesis and melena. EGD noted non-bleeding linear esophageal ulcer likely related to reflux and/or element of MW tear with ongoing ETOH abuse, small hiatal hernia, normal D2. Needs outpatient surveillance EGD.    No abdominal pain. No constipation or diarrhea. No rectal bleeding. Appetite is good. Protonix BID. No ETOH. Doesn't even want to smell it. No prior colonoscopy. No melena or hematochezia. No NSAIDs.    Past Medical History  Diagnosis Date  . Hiccups     Past Surgical History  Procedure Laterality Date  . Exploratory laparotomy      Dr. Tamala Julian 2006, SBO secondary to adhesions   . Esophagogastroduodenoscopy N/A 10/05/2015    Dr. Gala Romney: erosive reflux esophagitis, non-critcal Schatki's ring non-manipulated  . Colon surgery    . Esophagogastroduodenoscopy N/A 12/15/2015    Dr. Rourk:non bleeding esophageal ulcer/small HH    Current Outpatient Prescriptions  Medication Sig Dispense Refill  . calcium carbonate (OS-CAL - DOSED IN MG OF ELEMENTAL CALCIUM) 1250 (500 Ca) MG tablet Take 1 tablet (500 mg of elemental calcium total) by mouth 2 (two) times daily with a meal. 20 tablet 0  . folic acid (FOLVITE) 1 MG tablet Take 1 tablet (1 mg total) by mouth daily. 30 tablet 0  . Multiple Vitamin (MULTIVITAMIN WITH MINERALS) TABS tablet Take 1 tablet by mouth daily. 30 tablet 0  . pantoprazole (PROTONIX) 40 MG tablet Take 1 tablet (40 mg total) by mouth 2 (two) times daily. 30 minutes before breakfast 60 tablet 3   No current facility-administered medications for this visit.    Allergies as of 12/28/2015  . (No Known Allergies)    Family  History  Problem Relation Age of Onset  . Colon cancer Neg Hx     Social History   Social History  . Marital Status: Widowed    Spouse Name: N/A  . Number of Children: N/A  . Years of Education: N/A   Social History Main Topics  . Smoking status: Never Smoker   . Smokeless tobacco: Never Used  . Alcohol Use: 1.8 oz/week    3 Shots of liquor per week     Comment: daily  states "I drink vodka and everything"  . Drug Use: No  . Sexual Activity: Not Asked   Other Topics Concern  . None   Social History Narrative    Review of Systems: As mentioned in HPI.   Physical Exam: BP 136/88 mmHg  Pulse 75  Temp(Src) 97.7 F (36.5 C) (Oral)  Ht 5\' 6"  (1.676 m)  Wt 159 lb 6.4 oz (72.303 kg)  BMI 25.74 kg/m2 General:   Alert and oriented. No distress noted. Pleasant and cooperative.  Head:  Normocephalic and atraumatic. Eyes:  Conjuctiva clear without scleral icterus. Mouth:  Oral mucosa pink and moist. Good dentition. No lesions. Abdomen:  +BS, soft, non-tender and non-distended. No rebound or guarding. No HSM or masses noted. Msk:  Symmetrical without gross deformities. Normal posture. Extremities:  Without edema. Neurologic:  Alert and  oriented x4;  grossly normal neurologically. Psych:  Alert and cooperative. Normal mood and affect.

## 2015-12-28 NOTE — Patient Instructions (Signed)
Continue Protonix twice a day.  I will see you in 2 months, and we will arrange an upper endoscopy and colonoscopy then!

## 2015-12-29 NOTE — Progress Notes (Signed)
No pcp per pt °

## 2015-12-29 NOTE — Assessment & Plan Note (Signed)
Recent admission for hematemesis/melena with EGD noting non-bleeding linear esophageal ulcer likely secondary to reflux +/- MW tear in setting of ETOH abuse. Recommend surveillance in about 3 months. Doing well currently on PPI BID. Will need initial screening colonoscopy at that time as well. Return in 2 months to arrange.

## 2016-02-27 ENCOUNTER — Other Ambulatory Visit: Payer: Self-pay

## 2016-02-27 ENCOUNTER — Encounter: Payer: Self-pay | Admitting: Gastroenterology

## 2016-02-27 ENCOUNTER — Ambulatory Visit (INDEPENDENT_AMBULATORY_CARE_PROVIDER_SITE_OTHER): Payer: BLUE CROSS/BLUE SHIELD | Admitting: Gastroenterology

## 2016-02-27 VITALS — BP 118/81 | HR 79 | Temp 97.8°F | Ht 65.0 in | Wt 155.4 lb

## 2016-02-27 DIAGNOSIS — Z1211 Encounter for screening for malignant neoplasm of colon: Secondary | ICD-10-CM

## 2016-02-27 DIAGNOSIS — Z7189 Other specified counseling: Secondary | ICD-10-CM | POA: Insufficient documentation

## 2016-02-27 DIAGNOSIS — K2210 Ulcer of esophagus without bleeding: Secondary | ICD-10-CM

## 2016-02-27 MED ORDER — PEG 3350-KCL-NA BICARB-NACL 420 G PO SOLR: 4000.0000 mL | Freq: Once | ORAL | Status: DC

## 2016-02-27 NOTE — Progress Notes (Signed)
Referring Provider: No ref. provider found Primary Care Physician:  No PCP Per Patient Primary GI: Dr. Gala Romney   Chief Complaint  Patient presents with  . Follow-up    HPI:   Joshua Mckee is a 75 y.o. male presenting today with a history of erosive esophagitis, presenting in March 2017 to [facility] with hematemesis and melena. EGD noted non-bleeding linear esophageal ulcer likely related to reflux and/or element of MW tear with ongoing ETOH abuse, small hiatal hernia, normal D2. Needs outpatient surveillance EGD. No prior colonoscopy. Needs initial screening colonoscopy.   Taking Protonix once daily. No abdominal pain, N/V, overt GI bleeding. Denies any ETOH use. No lower GI symptoms. Would like to proceed with colonoscopy.   Past Medical History  Diagnosis Date  . Hiccups   . Erosive esophagitis     Past Surgical History  Procedure Laterality Date  . Exploratory laparotomy      Dr. Tamala Julian 2006, SBO secondary to adhesions   . Esophagogastroduodenoscopy N/A 10/05/2015    Dr. Gala Romney: erosive reflux esophagitis, non-critcal Schatki's ring non-manipulated  . Colon surgery    . Esophagogastroduodenoscopy N/A 12/15/2015    Dr. Rourk:non bleeding esophageal ulcer/small HH    Current Outpatient Prescriptions  Medication Sig Dispense Refill  . calcium carbonate (OS-CAL - DOSED IN MG OF ELEMENTAL CALCIUM) 1250 (500 Ca) MG tablet Take 1 tablet (500 mg of elemental calcium total) by mouth 2 (two) times daily with a meal. (Patient not taking: Reported on 02/28/2016) 20 tablet 0  . folic acid (FOLVITE) 1 MG tablet Take 1 tablet (1 mg total) by mouth daily. (Patient not taking: Reported on 02/28/2016) 30 tablet 0  . Multiple Vitamin (MULTIVITAMIN WITH MINERALS) TABS tablet Take 1 tablet by mouth daily. 30 tablet 0  . pantoprazole (PROTONIX) 40 MG tablet Take 1 tablet (40 mg total) by mouth 2 (two) times daily. 30 minutes before breakfast (Patient taking differently: Take 40 mg by mouth daily. 30  minutes before breakfast) 60 tablet 3  . aspirin EC 81 MG tablet Take 81 mg by mouth daily as needed (heart).    . polyethylene glycol-electrolytes (NULYTELY/GOLYTELY) 420 g solution Take 4,000 mLs by mouth once. 4000 mL 0   No current facility-administered medications for this visit.    Allergies as of 02/27/2016  . (No Known Allergies)    Family History  Problem Relation Age of Onset  . Colon cancer Neg Hx     Social History   Social History  . Marital Status: Widowed    Spouse Name: N/A  . Number of Children: N/A  . Years of Education: N/A   Social History Main Topics  . Smoking status: Never Smoker   . Smokeless tobacco: Never Used  . Alcohol Use: 1.8 oz/week    3 Shots of liquor per week     Comment: daily  states "I drink vodka and everything"  . Drug Use: No  . Sexual Activity: Not Asked   Other Topics Concern  . None   Social History Narrative    Review of Systems: As mentioned in HPI    Physical Exam: BP 118/81 mmHg  Pulse 79  Temp(Src) 97.8 F (36.6 C) (Oral)  Ht 5\' 5"  (1.651 m)  Wt 155 lb 6.4 oz (70.489 kg)  BMI 25.86 kg/m2 General:   Alert and oriented. No distress noted. Pleasant and cooperative.  Head:  Normocephalic and atraumatic. Eyes:  Conjuctiva clear without scleral icterus. Heart:  S1, S2 present without murmurs,  rubs, or gallops. Regular rate and rhythm. Abdomen:  +BS, soft, non-tender and non-distended. No rebound or guarding. No HSM or masses noted. Msk:  Symmetrical without gross deformities. Normal posture. Extremities:  Without edema. Neurologic:  Alert and  oriented x4;  grossly normal neurologically. Skin:  Intact without significant lesions or rashes. Psych:  Alert and cooperative. Normal mood and affect.

## 2016-02-27 NOTE — Patient Instructions (Signed)
Continue Protonix once per day.  We have scheduled you for a routine screening colonoscopy and upper endoscopy to reassess your esophagus in the near future.

## 2016-02-29 ENCOUNTER — Encounter: Payer: Self-pay | Admitting: Gastroenterology

## 2016-02-29 ENCOUNTER — Other Ambulatory Visit: Payer: Self-pay

## 2016-02-29 MED ORDER — PEG 3350-KCL-NA BICARB-NACL 420 G PO SOLR: 4000.0000 mL | Freq: Once | ORAL | Status: DC

## 2016-02-29 NOTE — Assessment & Plan Note (Signed)
75 year old male with history of esophageal ulcer likely secondary to reflux and/or MW tear, with need for surveillance EGD now per recommendations. Continues on Protonix once daily and has no concerning upper GI symptoms.  Proceed with upper endoscopy in the near future with Dr. Gala Romney. The risks, benefits, and alternatives have been discussed in detail with patient. They have stated understanding and desire to proceed.  Phenergan 12.5 mg IV on call

## 2016-02-29 NOTE — Assessment & Plan Note (Signed)
No prior screening colonoscopy. No concerning lower GI symptoms.Overdue for initial average screening colonoscopy. Due to age ,may not need any additional colonoscopies if normal.   Proceed with TCS with Dr. Gala Romney in near future: the risks, benefits, and alternatives have been discussed with the patient in detail. The patient states understanding and desires to proceed. Phenergan 12.5 mg IV on call

## 2016-03-01 NOTE — Progress Notes (Signed)
No pcp per patient 

## 2016-03-08 ENCOUNTER — Encounter (HOSPITAL_COMMUNITY): Payer: Self-pay | Admitting: *Deleted

## 2016-03-08 ENCOUNTER — Ambulatory Visit (HOSPITAL_COMMUNITY)
Admission: RE | Admit: 2016-03-08 | Discharge: 2016-03-08 | Disposition: A | Discharge status: Home / Self Care | Payer: BLUE CROSS/BLUE SHIELD | Source: Ambulatory Visit | Attending: Internal Medicine | Admitting: Internal Medicine

## 2016-03-08 ENCOUNTER — Encounter (HOSPITAL_COMMUNITY)
Admission: RE | Disposition: A | Discharge status: Home / Self Care | Payer: Self-pay | Source: Ambulatory Visit | Attending: Internal Medicine

## 2016-03-08 DIAGNOSIS — K449 Diaphragmatic hernia without obstruction or gangrene: Secondary | ICD-10-CM | POA: Insufficient documentation

## 2016-03-08 DIAGNOSIS — K2210 Ulcer of esophagus without bleeding: Secondary | ICD-10-CM

## 2016-03-08 DIAGNOSIS — K210 Gastro-esophageal reflux disease with esophagitis: Secondary | ICD-10-CM | POA: Insufficient documentation

## 2016-03-08 DIAGNOSIS — Z79899 Other long term (current) drug therapy: Secondary | ICD-10-CM | POA: Insufficient documentation

## 2016-03-08 DIAGNOSIS — Z1211 Encounter for screening for malignant neoplasm of colon: Secondary | ICD-10-CM

## 2016-03-08 HISTORY — PX: COLONOSCOPY: SHX5424

## 2016-03-08 HISTORY — PX: ESOPHAGOGASTRODUODENOSCOPY: SHX5428

## 2016-03-08 SURGERY — COLONOSCOPY
Anesthesia: Moderate Sedation

## 2016-03-08 MED ORDER — LIDOCAINE VISCOUS 2 % MT SOLN
OROMUCOSAL | Status: AC
  Filled 2016-03-08: qty 15

## 2016-03-08 MED ORDER — ONDANSETRON HCL 4 MG/2ML IJ SOLN
INTRAMUSCULAR | Status: AC
  Filled 2016-03-08: qty 2

## 2016-03-08 MED ORDER — MEPERIDINE HCL 100 MG/ML IJ SOLN
INTRAMUSCULAR | Status: DC
Start: 2016-03-08 — End: 2016-03-08
  Filled 2016-03-08: qty 2

## 2016-03-08 MED ORDER — SODIUM CHLORIDE 0.9% FLUSH
INTRAVENOUS | Status: AC
  Filled 2016-03-08: qty 10

## 2016-03-08 MED ORDER — MIDAZOLAM HCL 5 MG/5ML IJ SOLN
INTRAMUSCULAR | Status: AC
  Filled 2016-03-08: qty 10

## 2016-03-08 MED ORDER — MIDAZOLAM HCL 5 MG/5ML IJ SOLN
INTRAMUSCULAR | Status: DC | PRN
  Administered 2016-03-08 (×2): 1 mg via INTRAVENOUS
  Administered 2016-03-08: 2 mg via INTRAVENOUS

## 2016-03-08 MED ORDER — ONDANSETRON HCL 4 MG/2ML IJ SOLN
INTRAMUSCULAR | Status: DC | PRN
  Administered 2016-03-08: 4 mg via INTRAVENOUS

## 2016-03-08 MED ORDER — SODIUM CHLORIDE 0.9 % IV SOLN
INTRAVENOUS | Status: DC
  Administered 2016-03-08: 1000 mL via INTRAVENOUS

## 2016-03-08 MED ORDER — LIDOCAINE VISCOUS 2 % MT SOLN
OROMUCOSAL | Status: DC | PRN
  Administered 2016-03-08: 3 mL via OROMUCOSAL

## 2016-03-08 MED ORDER — STERILE WATER FOR IRRIGATION IR SOLN
Status: DC | PRN
  Administered 2016-03-08: 14:00:00

## 2016-03-08 MED ORDER — MEPERIDINE HCL 100 MG/ML IJ SOLN
INTRAMUSCULAR | Status: DC | PRN
  Administered 2016-03-08: 50 mg via INTRAVENOUS

## 2016-03-08 MED ORDER — PROMETHAZINE HCL 25 MG/ML IJ SOLN
12.5000 mg | Freq: Once | INTRAMUSCULAR | Status: AC
  Administered 2016-03-08: 12.5 mg via INTRAVENOUS

## 2016-03-08 MED ORDER — PROMETHAZINE HCL 25 MG/ML IJ SOLN
INTRAMUSCULAR | Status: AC
  Filled 2016-03-08: qty 1

## 2016-03-08 NOTE — Discharge Instructions (Signed)
Colonoscopy Discharge Instructions  Read the instructions outlined below and refer to this sheet in the next few weeks. These discharge instructions provide you with general information on caring for yourself after you leave the hospital. Your doctor may also give you specific instructions. While your treatment has been planned according to the most current medical practices available, unavoidable complications occasionally occur. If you have any problems or questions after discharge, call Dr. Gala Romney at [phone]. ACTIVITY  You may resume your regular activity, but move at a slower pace for the next 24 hours.   Take frequent rest periods for the next 24 hours.   Walking will help get rid of the air and reduce the bloated feeling in your belly (abdomen).   No driving for 24 hours (because of the medicine (anesthesia) used during the test).    Do not sign any important legal documents or operate any machinery for 24 hours (because of the anesthesia used during the test).  NUTRITION  Drink plenty of fluids.   You may resume your normal diet as instructed by your doctor.   Begin with a light meal and progress to your normal diet. Heavy or fried foods are harder to digest and may make you feel sick to your stomach (nauseated).   Avoid alcoholic beverages for 24 hours or as instructed.  MEDICATIONS  You may resume your normal medications unless your doctor tells you otherwise.  WHAT YOU CAN EXPECT TODAY  Some feelings of bloating in the abdomen.   Passage of more gas than usual.   Spotting of blood in your stool or on the toilet paper.  IF YOU HAD POLYPS REMOVED DURING THE COLONOSCOPY:  No aspirin products for 7 days or as instructed.   No alcohol for 7 days or as instructed.   Eat a soft diet for the next 24 hours.  FINDING OUT THE RESULTS OF YOUR TEST Not all test results are available during your visit. If your test results are not back during the visit, make an appointment  with your caregiver to find out the results. Do not assume everything is normal if you have not heard from your caregiver or the medical facility. It is important for you to follow up on all of your test results.  SEEK IMMEDIATE MEDICAL ATTENTION IF:  You have more than a spotting of blood in your stool.   Your belly is swollen (abdominal distention).   You are nauseated or vomiting.   You have a temperature over 101.   You have abdominal pain or discomfort that is severe or gets worse throughout the day.  EGD Discharge instructions Please read the instructions outlined below and refer to this sheet in the next few weeks. These discharge instructions provide you with general information on caring for yourself after you leave the hospital. Your doctor may also give you specific instructions. While your treatment has been planned according to the most current medical practices available, unavoidable complications occasionally occur. If you have any problems or questions after discharge, please call your doctor. ACTIVITY You may resume your regular activity but move at a slower pace for the next 24 hours.  Take frequent rest periods for the next 24 hours.  Walking will help expel (get rid of) the air and reduce the bloated feeling in your abdomen.  No driving for 24 hours (because of the anesthesia (medicine) used during the test).  You may shower.  Do not sign any important legal documents or operate any machinery  for 24 hours (because of the anesthesia used during the test).  NUTRITION Drink plenty of fluids.  You may resume your normal diet.  Begin with a light meal and progress to your normal diet.  Avoid alcoholic beverages for 24 hours or as instructed by your caregiver.  MEDICATIONS You may resume your normal medications unless your caregiver tells you otherwise.  WHAT YOU CAN EXPECT TODAY You may experience abdominal discomfort such as a feeling of fullness or gas pains.    FOLLOW-UP Your doctor will discuss the results of your test with you.  SEEK IMMEDIATE MEDICAL ATTENTION IF ANY OF THE FOLLOWING OCCUR: Excessive nausea (feeling sick to your stomach) and/or vomiting.  Severe abdominal pain and distention (swelling).  Trouble swallowing.  Temperature over 101 F (37.8 C).  Rectal bleeding or vomiting of blood.     Continue Protonix 40 mg daily  Office visit with [place] in 3 months

## 2016-03-08 NOTE — Op Note (Signed)
[facility] Patient Name: Joshua Mckee Procedure Date: 03/08/2016 2:26 PM MRN: [id number] Date of Birth: 1941-09-15 Attending MD: Norvel Richards , MD CSN: [id number] Age: 75 Admit Type: Outpatient Procedure:                Colonoscopy - screening Indications:              Screening for colorectal malignant neoplasm Providers:                Norvel Richards, MD, Gwenlyn Fudge RN, RN,                            Randa Spike, Technician Referring MD:              Medicines:                Midazolam 4 mg IV, Meperidine 50 mg IV, Ondansetron                            4 mg IV, Promethazine [id number] mg IV Complications:            No immediate complications. Estimated Blood Loss:     Estimated blood loss: none. Procedure:                Pre-Anesthesia Assessment:                           - Prior to the procedure, a History and Physical                            was performed, and patient medications and                            allergies were reviewed. The patient's tolerance of                            previous anesthesia was also reviewed. The risks                            and benefits of the procedure and the sedation                            options and risks were discussed with the patient.                            All questions were answered, and informed consent                            was obtained. Prior Anticoagulants: The patient has                            taken no previous anticoagulant or antiplatelet                            agents. ASA Grade Assessment: II - A patient with  mild systemic disease. After reviewing the risks                            and benefits, the patient was deemed in                            satisfactory condition to undergo the procedure.                           - Prior to the procedure, a History and Physical                            was performed, and patient medications and                   allergies were reviewed. The patient's tolerance of                            previous anesthesia was also reviewed. The risks                            and benefits of the procedure and the sedation                            options and risks were discussed with the patient.                            All questions were answered, and informed consent                            was obtained. Prior Anticoagulants: The patient has                            taken no previous anticoagulant or antiplatelet                            agents. ASA Grade Assessment: II - A patient with                            mild systemic disease. After reviewing the risks                            and benefits, the patient was deemed in                            satisfactory condition to undergo the procedure.                           After obtaining informed consent, the colonoscope                            was passed under direct vision. Throughout the  procedure, the patient's blood pressure, pulse, and                            oxygen saturations were monitored continuously. The                            [id number] scope was introduced through                            the anus and advanced to the the cecum, identified                            by appendiceal orifice and ileocecal valve. The                            colonoscopy was performed without difficulty. The                            patient tolerated the procedure well. The quality                            of the bowel preparation was adequate. The                            ileocecal valve, appendiceal orifice, and rectum                            were photographed. Scope In: 2:32:34 PM Scope Out: 2:47:33 PM Scope Withdrawal Time: 0 hours 7 minutes 29 seconds  Total Procedure Duration: 0 hours 14 minutes 59 seconds  Findings:      The perianal and digital rectal examinations  were normal.      The entire examined colon appeared normal on direct and retroflexion       views. Impression:               - The entire examined colon is normal on direct and                            retroflexion views.                           - No specimens collected. Moderate Sedation:      Moderate (conscious) sedation was administered by the endoscopy nurse       and supervised by the endoscopist. The following parameters were       monitored: oxygen saturation, heart rate, blood pressure, respiratory       rate, EKG, adequacy of pulmonary ventilation, and response to care.       Total physician intraservice time was 30 minutes. Recommendation:           - Patient has a contact number available for                            emergencies. The signs and symptoms of potential  delayed complications were discussed with the                            patient. Return to normal activities tomorrow.                            Written discharge instructions were provided to the                            patient.                           - Advance diet as tolerated.                           - Continue present medications.                           - No repeat colonoscopy due to age.                           - Return to GI office in 8 weeks. See EGD report. Procedure Code(s):        --- Professional ---                           [id number], Colonoscopy, flexible; diagnostic, including                            collection of specimen(s) by brushing or washing,                            when performed (separate procedure)                           99152, Moderate sedation services provided by the                            same physician or other qualified health care                            professional performing the diagnostic or                            therapeutic service that the sedation supports,                            requiring the presence of  an independent trained                            observer to assist in the monitoring of the                            patient's level of consciousness and physiological  status; initial 15 minutes of intraservice time,                            patient age 69 years or older                           [phone], Moderate sedation services; each additional                            15 minutes intraservice time Diagnosis Code(s):        --- Professional ---                           Z12.11, Encounter for screening for malignant                            neoplasm of colon CPT copyright 2016 [employer]. All rights reserved. The codes documented in this report are preliminary and upon coder review may  be revised to meet current compliance requirements. Cristopher Estimable. Tykee Heideman, MD Norvel Richards, MD 03/08/2016 2:57:36 PM This report has been signed electronically. Number of Addenda: 0

## 2016-03-08 NOTE — H&P (View-Only) (Signed)
Referring Provider: No ref. provider found Primary Care Physician:  No PCP Per Patient Primary GI: Dr. Gala Romney   Chief Complaint  Patient presents with  . Follow-up    HPI:   Joshua Mckee is a 75 y.o. male presenting today with a history of erosive esophagitis, presenting in March 2017 to [facility] with hematemesis and melena. EGD noted non-bleeding linear esophageal ulcer likely related to reflux and/or element of MW tear with ongoing ETOH abuse, small hiatal hernia, normal D2. Needs outpatient surveillance EGD. No prior colonoscopy. Needs initial screening colonoscopy.   Taking Protonix once daily. No abdominal pain, N/V, overt GI bleeding. Denies any ETOH use. No lower GI symptoms. Would like to proceed with colonoscopy.   Past Medical History  Diagnosis Date  . Hiccups   . Erosive esophagitis     Past Surgical History  Procedure Laterality Date  . Exploratory laparotomy      Dr. Tamala Julian 2006, SBO secondary to adhesions   . Esophagogastroduodenoscopy N/A 10/05/2015    Dr. Gala Romney: erosive reflux esophagitis, non-critcal Schatki's ring non-manipulated  . Colon surgery    . Esophagogastroduodenoscopy N/A 12/15/2015    Dr. Rourk:non bleeding esophageal ulcer/small HH    Current Outpatient Prescriptions  Medication Sig Dispense Refill  . calcium carbonate (OS-CAL - DOSED IN MG OF ELEMENTAL CALCIUM) 1250 (500 Ca) MG tablet Take 1 tablet (500 mg of elemental calcium total) by mouth 2 (two) times daily with a meal. (Patient not taking: Reported on 02/28/2016) 20 tablet 0  . folic acid (FOLVITE) 1 MG tablet Take 1 tablet (1 mg total) by mouth daily. (Patient not taking: Reported on 02/28/2016) 30 tablet 0  . Multiple Vitamin (MULTIVITAMIN WITH MINERALS) TABS tablet Take 1 tablet by mouth daily. 30 tablet 0  . pantoprazole (PROTONIX) 40 MG tablet Take 1 tablet (40 mg total) by mouth 2 (two) times daily. 30 minutes before breakfast (Patient taking differently: Take 40 mg by mouth daily. 30  minutes before breakfast) 60 tablet 3  . aspirin EC 81 MG tablet Take 81 mg by mouth daily as needed (heart).    . polyethylene glycol-electrolytes (NULYTELY/GOLYTELY) 420 g solution Take 4,000 mLs by mouth once. 4000 mL 0   No current facility-administered medications for this visit.    Allergies as of 02/27/2016  . (No Known Allergies)    Family History  Problem Relation Age of Onset  . Colon cancer Neg Hx     Social History   Social History  . Marital Status: Widowed    Spouse Name: N/A  . Number of Children: N/A  . Years of Education: N/A   Social History Main Topics  . Smoking status: Never Smoker   . Smokeless tobacco: Never Used  . Alcohol Use: 1.8 oz/week    3 Shots of liquor per week     Comment: daily  states "I drink vodka and everything"  . Drug Use: No  . Sexual Activity: Not Asked   Other Topics Concern  . None   Social History Narrative    Review of Systems: As mentioned in HPI    Physical Exam: BP 118/81 mmHg  Pulse 79  Temp(Src) 97.8 F (36.6 C) (Oral)  Ht 5\' 5"  (1.651 m)  Wt 155 lb 6.4 oz (70.489 kg)  BMI 25.86 kg/m2 General:   Alert and oriented. No distress noted. Pleasant and cooperative.  Head:  Normocephalic and atraumatic. Eyes:  Conjuctiva clear without scleral icterus. Heart:  S1, S2 present without murmurs,  rubs, or gallops. Regular rate and rhythm. Abdomen:  +BS, soft, non-tender and non-distended. No rebound or guarding. No HSM or masses noted. Msk:  Symmetrical without gross deformities. Normal posture. Extremities:  Without edema. Neurologic:  Alert and  oriented x4;  grossly normal neurologically. Skin:  Intact without significant lesions or rashes. Psych:  Alert and cooperative. Normal mood and affect.

## 2016-03-08 NOTE — Interval H&P Note (Signed)
History and Physical Interval Note:  03/08/2016 2:14 PM  Joshua Mckee  has presented today for surgery, with the diagnosis of screening colonoscopy, esophageal ulcer  The various methods of treatment have been discussed with the patient and family. After consideration of risks, benefits and other options for treatment, the patient has consented to  Procedure(s) with comments: COLONOSCOPY (N/A) - 1300-moved to 1400 ESOPHAGOGASTRODUODENOSCOPY (EGD) (N/A) as a surgical intervention .  The patient's history has been reviewed, patient examined, no change in status, stable for surgery.  I have reviewed the patient's chart and labs.  Questions were answered to the patient's satisfaction.     Joshua Mckee  No change. Follow-up EGD and first ever screening colonoscopy per plan.  The risks, benefits, limitations, imponderables and alternatives regarding both EGD and colonoscopy have been reviewed with the patient. Questions have been answered. All parties agreeable.

## 2016-03-08 NOTE — Op Note (Signed)
[facility] Patient Name: Joshua Mckee Procedure Date: 03/08/2016 2:06 PM MRN: [id number] Date of Birth: 03-27-1941 Attending MD: Norvel Richards , MD CSN: [id number] Age: 75 Admit Type: Outpatient Procedure:                Upper GI endoscopy Indications:              Follow-up of reflux esophagitis Providers:                Norvel Richards, MD, Jeanann Lewandowsky. Gwenlyn Perking RN, RN,                            Randa Spike, Technician Referring MD:              Medicines:                Midazolam 3 mg IV, Meperidine 50 mg IV, Ondansetron                            4 mg IV, Promethazine [id number] mg IV Complications:            No immediate complications. Estimated Blood Loss:     Estimated blood loss: none. Procedure:                Pre-Anesthesia Assessment:                           - Prior to the procedure, a History and Physical                            was performed, and patient medications and                            allergies were reviewed. The patient's tolerance of                            previous anesthesia was also reviewed. The risks                            and benefits of the procedure and the sedation                            options and risks were discussed with the patient.                            All questions were answered, and informed consent                            was obtained. Prior Anticoagulants: The patient has                            taken no previous anticoagulant or antiplatelet                            agents. ASA Grade Assessment: II - A patient with  mild systemic disease. After reviewing the risks                            and benefits, the patient was deemed in                            satisfactory condition to undergo the procedure.                           After obtaining informed consent, the endoscope was                            passed under direct vision. Throughout the        procedure, the patient's blood pressure, pulse, and                            oxygen saturations were monitored continuously. The                            [id number]) scope was introduced through the                            mouth, and advanced to the second part of duodenum.                            The upper GI endoscopy was accomplished without                            difficulty. The patient tolerated the procedure                            well. Scope In: 2:22:12 PM Scope Out: 2:26:21 PM Total Procedure Duration: 0 hours 4 minutes 9 seconds  Findings:      LA Grade A (one or more mucosal breaks less than 5 mm, not extending       between tops of 2 mucosal folds) esophagitis was found 35 to 37 cm from       the incisors. No Barrett's epithelium seen.      A medium-sized hiatal hernia was present.      The second portion of the duodenum was normal. Impression:               - LA Grade A esophagitis.                           - Medium-sized hiatal hernia.                           - Normal second portion of the duodenum.                           - No specimens collected. Moderate Sedation:      Moderate (conscious) sedation was administered by the endoscopy nurse       and supervised by the endoscopist. The following parameters were       monitored: oxygen saturation, heart  rate, blood pressure, respiratory       rate, EKG, adequacy of pulmonary ventilation, and response to care.       Total physician intraservice time was 9 minutes. Recommendation:           - Patient has a contact number available for                            emergencies. The signs and symptoms of potential                            delayed complications were discussed with the                            patient. Return to normal activities tomorrow.                            Written discharge instructions were provided to the                            patient.                            - Advance diet as tolerated.                           - Patient has a contact number available for                            emergencies. The signs and symptoms of potential                            delayed complications were discussed with the                            patient. Return to normal activities tomorrow.                            Written discharge instructions were provided to the                            patient.                           - Patient has a contact number available for                            emergencies. The signs and symptoms of potential                            delayed complications were discussed with the                            patient. Return to normal activities tomorrow.  Written discharge instructions were provided to the                            patient.                           - Use Protonix (pantoprazole) 40 mg PO daily                            indefinitely. Office visit with [place] in 3 months.                           - Continue present medications. Procedure Code(s):        --- Professional ---                           [phone], Esophagogastroduodenoscopy, flexible,                            transoral; diagnostic, including collection of                            specimen(s) by brushing or washing, when performed                            (separate procedure) Diagnosis Code(s):        --- Professional ---                           K44.9, Diaphragmatic hernia without obstruction or                            gangrene                           K21.0, Gastro-esophageal reflux disease with                            esophagitis CPT copyright 2016 American [employer]. All rights reserved. The codes documented in this report are preliminary and upon coder review may  be revised to meet current compliance requirements. Cristopher Estimable. Britney Newstrom, MD Norvel Richards, MD 03/08/2016 2:54:15 PM This report  has been signed electronically. Number of Addenda: 0

## 2016-03-12 ENCOUNTER — Encounter (HOSPITAL_COMMUNITY): Payer: Self-pay | Admitting: Internal Medicine

## 2016-06-08 ENCOUNTER — Telehealth: Payer: Self-pay | Admitting: Gastroenterology

## 2016-06-08 ENCOUNTER — Ambulatory Visit: Payer: Medicare Other | Admitting: Gastroenterology

## 2016-06-08 ENCOUNTER — Encounter: Payer: Self-pay | Admitting: Gastroenterology

## 2016-06-08 NOTE — Telephone Encounter (Signed)
PT WAS A NO SHOW AND LETTER SENT  °

## 2016-08-27 ENCOUNTER — Inpatient Hospital Stay (HOSPITAL_COMMUNITY)
Admission: EM | Admit: 2016-08-27 | Discharge: 2016-08-29 | DRG: 065 | Disposition: A | Discharge status: Home / Self Care | Payer: BLUE CROSS/BLUE SHIELD | Attending: Internal Medicine | Admitting: Internal Medicine

## 2016-08-27 ENCOUNTER — Emergency Department (HOSPITAL_COMMUNITY): Payer: BLUE CROSS/BLUE SHIELD

## 2016-08-27 ENCOUNTER — Encounter (HOSPITAL_COMMUNITY): Payer: Self-pay | Admitting: Emergency Medicine

## 2016-08-27 DIAGNOSIS — I10 Essential (primary) hypertension: Secondary | ICD-10-CM | POA: Diagnosis present

## 2016-08-27 DIAGNOSIS — I638 Other cerebral infarction: Secondary | ICD-10-CM | POA: Diagnosis present

## 2016-08-27 DIAGNOSIS — G8194 Hemiplegia, unspecified affecting left nondominant side: Secondary | ICD-10-CM | POA: Diagnosis present

## 2016-08-27 DIAGNOSIS — E876 Hypokalemia: Secondary | ICD-10-CM | POA: Diagnosis present

## 2016-08-27 DIAGNOSIS — I739 Peripheral vascular disease, unspecified: Secondary | ICD-10-CM | POA: Diagnosis present

## 2016-08-27 DIAGNOSIS — Z8249 Family history of ischemic heart disease and other diseases of the circulatory system: Secondary | ICD-10-CM

## 2016-08-27 DIAGNOSIS — R2689 Other abnormalities of gait and mobility: Secondary | ICD-10-CM

## 2016-08-27 DIAGNOSIS — R9431 Abnormal electrocardiogram [ECG] [EKG]: Secondary | ICD-10-CM | POA: Diagnosis present

## 2016-08-27 DIAGNOSIS — I6350 Cerebral infarction due to unspecified occlusion or stenosis of unspecified cerebral artery: Secondary | ICD-10-CM | POA: Diagnosis not present

## 2016-08-27 DIAGNOSIS — Z8673 Personal history of transient ischemic attack (TIA), and cerebral infarction without residual deficits: Secondary | ICD-10-CM

## 2016-08-27 DIAGNOSIS — K449 Diaphragmatic hernia without obstruction or gangrene: Secondary | ICD-10-CM

## 2016-08-27 DIAGNOSIS — Z7982 Long term (current) use of aspirin: Secondary | ICD-10-CM

## 2016-08-27 DIAGNOSIS — I639 Cerebral infarction, unspecified: Secondary | ICD-10-CM | POA: Diagnosis not present

## 2016-08-27 DIAGNOSIS — R29704 NIHSS score 4: Secondary | ICD-10-CM | POA: Diagnosis present

## 2016-08-27 DIAGNOSIS — R29701 NIHSS score 1: Secondary | ICD-10-CM | POA: Diagnosis present

## 2016-08-27 DIAGNOSIS — K2210 Ulcer of esophagus without bleeding: Secondary | ICD-10-CM | POA: Diagnosis present

## 2016-08-27 DIAGNOSIS — Z23 Encounter for immunization: Secondary | ICD-10-CM | POA: Diagnosis not present

## 2016-08-27 DIAGNOSIS — F1010 Alcohol abuse, uncomplicated: Secondary | ICD-10-CM | POA: Diagnosis present

## 2016-08-27 DIAGNOSIS — Q030 Malformations of aqueduct of Sylvius: Secondary | ICD-10-CM

## 2016-08-27 DIAGNOSIS — K210 Gastro-esophageal reflux disease with esophagitis: Secondary | ICD-10-CM | POA: Diagnosis present

## 2016-08-27 DIAGNOSIS — G8314 Monoplegia of lower limb affecting left nondominant side: Secondary | ICD-10-CM | POA: Diagnosis present

## 2016-08-27 HISTORY — DX: Personal history of transient ischemic attack (TIA), and cerebral infarction without residual deficits: Z86.73

## 2016-08-27 LAB — COMPREHENSIVE METABOLIC PANEL
ALK PHOS: 58 U/L (ref 38–126)
ALT: 22 U/L (ref 17–63)
AST: 30 U/L (ref 15–41)
Albumin: 3.5 g/dL (ref 3.5–5.0)
Anion gap: 6 (ref 5–15)
BUN: 19 mg/dL (ref 6–20)
CALCIUM: 8.4 mg/dL — AB (ref 8.9–10.3)
CHLORIDE: 103 mmol/L (ref 101–111)
CO2: 28 mmol/L (ref 22–32)
CREATININE: 0.96 mg/dL (ref 0.61–1.24)
GFR calc Af Amer: >60 mL/min (ref >60)
Glucose, Bld: 92 mg/dL (ref 65–99)
Potassium: 3.4 mmol/L — ABNORMAL LOW (ref 3.5–5.1)
SODIUM: 137 mmol/L (ref 135–145)
Total Bilirubin: 0.4 mg/dL (ref 0.3–1.2)
Total Protein: 7 g/dL (ref 6.5–8.1)

## 2016-08-27 LAB — CBC
HEMATOCRIT: 42.6 % (ref 39.0–52.0)
Hemoglobin: 14.2 g/dL (ref 13.0–17.0)
MCH: 30 pg (ref 26.0–34.0)
MCHC: 33.3 g/dL (ref 30.0–36.0)
MCV: 89.9 fL (ref 78.0–100.0)
Platelets: 152 10*3/uL (ref 150–400)
RBC: 4.74 MIL/uL (ref 4.22–5.81)
RDW: 13.9 % (ref 11.5–15.5)
WBC: 7.7 10*3/uL (ref 4.0–10.5)

## 2016-08-27 LAB — I-STAT TROPONIN, ED: Troponin i, poc: 0.01 ng/mL (ref 0.00–0.08)

## 2016-08-27 LAB — URINALYSIS, ROUTINE W REFLEX MICROSCOPIC
BILIRUBIN URINE: NEGATIVE
Glucose, UA: NEGATIVE mg/dL
HGB URINE DIPSTICK: NEGATIVE
KETONES UR: 5 mg/dL — AB
Leukocytes, UA: NEGATIVE
NITRITE: NEGATIVE
PROTEIN: NEGATIVE mg/dL
Specific Gravity, Urine: 1.019 (ref 1.005–1.030)
pH: 6 (ref 5.0–8.0)

## 2016-08-27 LAB — DIFFERENTIAL
BASOS ABS: 0 10*3/uL (ref 0.0–0.1)
BASOS PCT: 0 %
Eosinophils Absolute: 0.2 10*3/uL (ref 0.0–0.7)
Eosinophils Relative: 2 %
LYMPHS PCT: 26 %
Lymphs Abs: 2 10*3/uL (ref 0.7–4.0)
MONO ABS: 0.7 10*3/uL (ref 0.1–1.0)
MONOS PCT: 9 %
NEUTROS ABS: 4.8 10*3/uL (ref 1.7–7.7)
Neutrophils Relative %: 63 %

## 2016-08-27 LAB — APTT: APTT: 25 s (ref 24–36)

## 2016-08-27 LAB — RAPID URINE DRUG SCREEN, HOSP PERFORMED
AMPHETAMINES: NOT DETECTED
BARBITURATES: NOT DETECTED
BENZODIAZEPINES: NOT DETECTED
Cocaine: NOT DETECTED
Opiates: NOT DETECTED
Tetrahydrocannabinol: NOT DETECTED

## 2016-08-27 LAB — ETHANOL

## 2016-08-27 LAB — PROTIME-INR
INR: 1.07
Prothrombin Time: 13.9 seconds (ref 11.4–15.2)

## 2016-08-27 MED ORDER — ACETAMINOPHEN 325 MG PO TABS: 650.0000 mg | ORAL_TABLET | ORAL | Status: DC | PRN

## 2016-08-27 MED ORDER — ONDANSETRON HCL 4 MG PO TABS: 4.0000 mg | ORAL_TABLET | Freq: Four times a day (QID) | ORAL | Status: DC | PRN

## 2016-08-27 MED ORDER — STROKE: EARLY STAGES OF RECOVERY BOOK
Freq: Once | Status: DC
  Filled 2016-08-27 (×2): qty 1

## 2016-08-27 MED ORDER — PANTOPRAZOLE SODIUM 40 MG PO TBEC
40.0000 mg | DELAYED_RELEASE_TABLET | Freq: Every day | ORAL | Status: DC
  Administered 2016-08-27 – 2016-08-29 (×3): 40 mg via ORAL
  Filled 2016-08-27 (×3): qty 1

## 2016-08-27 MED ORDER — PNEUMOCOCCAL VAC POLYVALENT 25 MCG/0.5ML IJ INJ
0.5000 mL | INJECTION | INTRAMUSCULAR | Status: AC
  Administered 2016-08-28: 0.5 mL via INTRAMUSCULAR
  Filled 2016-08-27: qty 0.5

## 2016-08-27 MED ORDER — LORAZEPAM 1 MG PO TABS: 1.0000 mg | ORAL_TABLET | Freq: Four times a day (QID) | ORAL | Status: DC | PRN

## 2016-08-27 MED ORDER — FAMOTIDINE 20 MG PO TABS
40.0000 mg | ORAL_TABLET | Freq: Once | ORAL | Status: AC
  Administered 2016-08-27: 40 mg via ORAL
  Filled 2016-08-27: qty 2

## 2016-08-27 MED ORDER — ADULT MULTIVITAMIN W/MINERALS CH
1.0000 | ORAL_TABLET | Freq: Every day | ORAL | Status: DC
  Administered 2016-08-27 – 2016-08-29 (×3): 1 via ORAL
  Filled 2016-08-27 (×3): qty 1

## 2016-08-27 MED ORDER — INFLUENZA VAC SPLIT QUAD 0.5 ML IM SUSY
0.5000 mL | PREFILLED_SYRINGE | INTRAMUSCULAR | Status: AC
  Administered 2016-08-28: 0.5 mL via INTRAMUSCULAR
  Filled 2016-08-27: qty 0.5

## 2016-08-27 MED ORDER — POTASSIUM CHLORIDE 20 MEQ PO PACK
20.0000 meq | PACK | Freq: Once | ORAL | Status: AC
  Administered 2016-08-27: 20 meq via ORAL
  Filled 2016-08-27: qty 1

## 2016-08-27 MED ORDER — ENOXAPARIN SODIUM 40 MG/0.4ML ~~LOC~~ SOLN
40.0000 mg | SUBCUTANEOUS | Status: DC
  Administered 2016-08-27 – 2016-08-28 (×2): 40 mg via SUBCUTANEOUS
  Filled 2016-08-27 (×2): qty 0.4

## 2016-08-27 MED ORDER — ASPIRIN 325 MG PO TABS
325.0000 mg | ORAL_TABLET | Freq: Once | ORAL | Status: AC
  Administered 2016-08-27: 325 mg via ORAL
  Filled 2016-08-27: qty 1

## 2016-08-27 MED ORDER — LORAZEPAM 2 MG/ML IJ SOLN
1.0000 mg | Freq: Four times a day (QID) | INTRAMUSCULAR | Status: DC | PRN
  Filled 2016-08-27: qty 1

## 2016-08-27 MED ORDER — ONDANSETRON HCL 4 MG/2ML IJ SOLN: 4.0000 mg | Freq: Four times a day (QID) | INTRAMUSCULAR | Status: DC | PRN

## 2016-08-27 MED ORDER — FOLIC ACID 1 MG PO TABS
1.0000 mg | ORAL_TABLET | Freq: Every day | ORAL | Status: DC
  Administered 2016-08-27 – 2016-08-29 (×3): 1 mg via ORAL
  Filled 2016-08-27 (×3): qty 1

## 2016-08-27 MED ORDER — ASPIRIN EC 81 MG PO TBEC
81.0000 mg | DELAYED_RELEASE_TABLET | Freq: Every day | ORAL | Status: DC
  Administered 2016-08-28: 81 mg via ORAL
  Filled 2016-08-27: qty 1

## 2016-08-27 MED ORDER — ACETAMINOPHEN 160 MG/5ML PO SOLN: 650.0000 mg | ORAL | Status: DC | PRN

## 2016-08-27 MED ORDER — ACETAMINOPHEN 650 MG RE SUPP: 650.0000 mg | RECTAL | Status: DC | PRN

## 2016-08-27 MED ORDER — VITAMIN B-1 100 MG PO TABS
100.0000 mg | ORAL_TABLET | Freq: Every day | ORAL | Status: DC
  Administered 2016-08-27 – 2016-08-29 (×3): 100 mg via ORAL
  Filled 2016-08-27 (×3): qty 1

## 2016-08-27 MED ORDER — THIAMINE HCL 100 MG/ML IJ SOLN: 100.0000 mg | Freq: Every day | INTRAMUSCULAR | Status: DC

## 2016-08-27 MED ORDER — POTASSIUM CHLORIDE IN NACL 40-0.9 MEQ/L-% IV SOLN
INTRAVENOUS | Status: AC
  Administered 2016-08-27: 100 mL/h via INTRAVENOUS

## 2016-08-27 MED ORDER — POTASSIUM CHLORIDE IN NACL 40-0.9 MEQ/L-% IV SOLN
INTRAVENOUS | Status: DC
  Administered 2016-08-27: 100 mL/h via INTRAVENOUS

## 2016-08-27 NOTE — ED Notes (Signed)
EDP notified of pt's bp  with no new orders
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
## 2016-08-28 ENCOUNTER — Inpatient Hospital Stay (HOSPITAL_COMMUNITY): Payer: BLUE CROSS/BLUE SHIELD

## 2016-08-28 DIAGNOSIS — E876 Hypokalemia: Secondary | ICD-10-CM

## 2016-08-28 DIAGNOSIS — I6350 Cerebral infarction due to unspecified occlusion or stenosis of unspecified cerebral artery: Secondary | ICD-10-CM

## 2016-08-28 HISTORY — PX: TRANSTHORACIC ECHOCARDIOGRAM: SHX275

## 2016-08-28 LAB — COMPREHENSIVE METABOLIC PANEL
ALK PHOS: 52 U/L (ref 38–126)
ALT: 18 U/L (ref 17–63)
AST: 25 U/L (ref 15–41)
Albumin: 3.2 g/dL — ABNORMAL LOW (ref 3.5–5.0)
Anion gap: 6 (ref 5–15)
BUN: 13 mg/dL (ref 6–20)
CALCIUM: 8.2 mg/dL — AB (ref 8.9–10.3)
CO2: 26 mmol/L (ref 22–32)
CREATININE: 0.9 mg/dL (ref 0.61–1.24)
Chloride: 105 mmol/L (ref 101–111)
Glucose, Bld: 88 mg/dL (ref 65–99)
Potassium: 4.7 mmol/L (ref 3.5–5.1)
Sodium: 137 mmol/L (ref 135–145)
TOTAL PROTEIN: 6.5 g/dL (ref 6.5–8.1)
Total Bilirubin: 0.9 mg/dL (ref 0.3–1.2)

## 2016-08-28 LAB — MAGNESIUM: MAGNESIUM: 2 mg/dL (ref 1.7–2.4)

## 2016-08-28 LAB — LIPID PANEL
CHOLESTEROL: 169 mg/dL (ref 0–200)
HDL: 53 mg/dL (ref >40)
LDL CALC: 103 mg/dL — AB (ref 0–99)
TRIGLYCERIDES: 65 mg/dL (ref <150)
Total CHOL/HDL Ratio: 3.2 RATIO
VLDL: 13 mg/dL (ref 0–40)

## 2016-08-28 LAB — PHOSPHORUS: Phosphorus: 2.9 mg/dL (ref 2.5–4.6)

## 2016-08-28 MED ORDER — ASPIRIN EC 81 MG PO TBEC
81.0000 mg | DELAYED_RELEASE_TABLET | Freq: Every day | ORAL | Status: DC
  Administered 2016-08-29: 81 mg via ORAL
  Filled 2016-08-28: qty 1

## 2016-08-28 MED ORDER — ASPIRIN EC 325 MG PO TBEC: 325.0000 mg | DELAYED_RELEASE_TABLET | Freq: Every day | ORAL | Status: DC

## 2016-08-28 MED ORDER — ATORVASTATIN CALCIUM 40 MG PO TABS
40.0000 mg | ORAL_TABLET | Freq: Every day | ORAL | Status: DC
  Administered 2016-08-28: 40 mg via ORAL
  Filled 2016-08-28: qty 1

## 2016-08-28 NOTE — Progress Notes (Signed)
*  PRELIMINARY RESULTS* Echocardiogram 2D Echocardiogram has been performed.  Samuel Germany 08/28/2016, 2:54 PM
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
## 2016-08-28 NOTE — Progress Notes (Signed)
PT off unit for MRI. PT stable and left unit by wheelchair.
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
## 2016-08-29 DIAGNOSIS — F1010 Alcohol abuse, uncomplicated: Secondary | ICD-10-CM

## 2016-08-29 DIAGNOSIS — I639 Cerebral infarction, unspecified: Secondary | ICD-10-CM

## 2016-08-29 LAB — HEMOGLOBIN A1C
HEMOGLOBIN A1C: 5.7 % — AB (ref 4.8–5.6)
Mean Plasma Glucose: 117 mg/dL

## 2016-08-29 LAB — ECHOCARDIOGRAM COMPLETE
Height: 65 in
Weight: 2544 oz

## 2016-08-29 MED ORDER — ADULT MULTIVITAMIN W/MINERALS CH: 1.0000 | ORAL_TABLET | Freq: Every day | ORAL | Status: DC

## 2016-08-29 MED ORDER — ATORVASTATIN CALCIUM 40 MG PO TABS: 40.0000 mg | ORAL_TABLET | Freq: Every day | ORAL | 2 refills | Status: DC

## 2016-08-29 MED ORDER — ASPIRIN 81 MG PO TBEC: 81.0000 mg | DELAYED_RELEASE_TABLET | Freq: Every day | ORAL | Status: DC

## 2016-08-29 NOTE — Care Management Note (Signed)
Case Management Note  Patient Details  Name: Joshua Mckee MRN: [id number] Date of Birth: [birth year]  Subjective/Objective:            Adm for stroke workup, lives with girlfriend.         Action/Plan: Recommended for OP PT and SLP , patient agreeable, states his girlfriend can take him and will be with him at home at DC. Offered choice of OP facilities. Patient lives in [place], want [facility] OP rehab. Referral sent via EPIC.    Expected Discharge Date:     08/29/2016             Expected Discharge Plan:  Home/Self Care  In-House Referral:  NA  Discharge planning Services  CM Consult  Post Acute Care Choice:   (OP PT and SLP) Choice offered to:  Patient  DME Arranged:    DME Agency:     HH Arranged:    [address] Agency:     Status of Service:  Completed, signed off (OP PT and SLP)  If discussed at [place] of Stay Meetings, dates discussed:    Additional Comments:  Abishai Viegas, [name], RN 08/29/2016, 11:00 AM
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
## 2016-08-29 NOTE — Progress Notes (Signed)
Pt's IV catheter removed and intact. Pt's IV site clean dry and intact. Discharge instructions including medications and follow up appointments were reviewed and discussed with patient and his significant other. All questions were answered and no further questions at this time. Pt and his significant other has verbalized understanding of discharge instructions including medications. Pt in stable condition and in no acute distress at time of discharge. Stroke Booklet was reviewed and discussed with patient also. Pt will be escorted by nurse tech.
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
## 2016-10-11 ENCOUNTER — Other Ambulatory Visit: Payer: Self-pay

## 2016-10-11 MED ORDER — PANTOPRAZOLE SODIUM 40 MG PO TBEC: 40.0000 mg | DELAYED_RELEASE_TABLET | Freq: Every day | ORAL | 5 refills | Status: DC

## 2016-10-15 ENCOUNTER — Emergency Department (HOSPITAL_COMMUNITY): Payer: Medicare Other

## 2016-10-15 ENCOUNTER — Encounter (HOSPITAL_COMMUNITY): Payer: Self-pay | Admitting: *Deleted

## 2016-10-15 ENCOUNTER — Inpatient Hospital Stay (HOSPITAL_COMMUNITY)
Admission: EM | Admit: 2016-10-15 | Discharge: 2016-10-17 | DRG: 101 | Disposition: A | Discharge status: Home Health | Payer: Medicare Other | Attending: Internal Medicine | Admitting: Internal Medicine

## 2016-10-15 DIAGNOSIS — R569 Unspecified convulsions: Secondary | ICD-10-CM

## 2016-10-15 DIAGNOSIS — R739 Hyperglycemia, unspecified: Secondary | ICD-10-CM | POA: Diagnosis present

## 2016-10-15 DIAGNOSIS — G9389 Other specified disorders of brain: Secondary | ICD-10-CM | POA: Diagnosis present

## 2016-10-15 DIAGNOSIS — G458 Other transient cerebral ischemic attacks and related syndromes: Secondary | ICD-10-CM

## 2016-10-15 DIAGNOSIS — Z8719 Personal history of other diseases of the digestive system: Secondary | ICD-10-CM

## 2016-10-15 DIAGNOSIS — I10 Essential (primary) hypertension: Secondary | ICD-10-CM | POA: Diagnosis present

## 2016-10-15 DIAGNOSIS — Z79899 Other long term (current) drug therapy: Secondary | ICD-10-CM

## 2016-10-15 DIAGNOSIS — G40201 Localization-related (focal) (partial) symptomatic epilepsy and epileptic syndromes with complex partial seizures, not intractable, with status epilepticus: Secondary | ICD-10-CM | POA: Diagnosis not present

## 2016-10-15 DIAGNOSIS — I639 Cerebral infarction, unspecified: Secondary | ICD-10-CM | POA: Insufficient documentation

## 2016-10-15 DIAGNOSIS — R404 Transient alteration of awareness: Secondary | ICD-10-CM

## 2016-10-15 DIAGNOSIS — R4182 Altered mental status, unspecified: Secondary | ICD-10-CM | POA: Diagnosis present

## 2016-10-15 DIAGNOSIS — E512 Wernicke's encephalopathy: Secondary | ICD-10-CM | POA: Diagnosis present

## 2016-10-15 DIAGNOSIS — K2100 Gastro-esophageal reflux disease with esophagitis, without bleeding: Secondary | ICD-10-CM | POA: Diagnosis present

## 2016-10-15 DIAGNOSIS — Z7982 Long term (current) use of aspirin: Secondary | ICD-10-CM

## 2016-10-15 DIAGNOSIS — Z8673 Personal history of transient ischemic attack (TIA), and cerebral infarction without residual deficits: Secondary | ICD-10-CM

## 2016-10-15 DIAGNOSIS — E785 Hyperlipidemia, unspecified: Secondary | ICD-10-CM | POA: Diagnosis present

## 2016-10-15 DIAGNOSIS — Z8249 Family history of ischemic heart disease and other diseases of the circulatory system: Secondary | ICD-10-CM

## 2016-10-15 DIAGNOSIS — R9401 Abnormal electroencephalogram [EEG]: Secondary | ICD-10-CM | POA: Diagnosis present

## 2016-10-15 DIAGNOSIS — F1021 Alcohol dependence, in remission: Secondary | ICD-10-CM | POA: Diagnosis present

## 2016-10-15 LAB — URINALYSIS, ROUTINE W REFLEX MICROSCOPIC
BILIRUBIN URINE: NEGATIVE
Glucose, UA: NEGATIVE mg/dL
HGB URINE DIPSTICK: NEGATIVE
KETONES UR: NEGATIVE mg/dL
Leukocytes, UA: NEGATIVE
Nitrite: NEGATIVE
PROTEIN: NEGATIVE mg/dL
Specific Gravity, Urine: 1.008 (ref 1.005–1.030)
pH: 7 (ref 5.0–8.0)

## 2016-10-15 LAB — CBC
HCT: 42.1 % (ref 39.0–52.0)
Hemoglobin: 14.2 g/dL (ref 13.0–17.0)
MCH: 29.6 pg (ref 26.0–34.0)
MCHC: 33.7 g/dL (ref 30.0–36.0)
MCV: 87.7 fL (ref 78.0–100.0)
PLATELETS: 201 10*3/uL (ref 150–400)
RBC: 4.8 MIL/uL (ref 4.22–5.81)
RDW: 13.5 % (ref 11.5–15.5)
WBC: 6.1 10*3/uL (ref 4.0–10.5)

## 2016-10-15 LAB — COMPREHENSIVE METABOLIC PANEL
ALT: 19 U/L (ref 17–63)
AST: 27 U/L (ref 15–41)
Albumin: 3.7 g/dL (ref 3.5–5.0)
Alkaline Phosphatase: 72 U/L (ref 38–126)
Anion gap: 5 (ref 5–15)
BUN: 11 mg/dL (ref 6–20)
CHLORIDE: 99 mmol/L — AB (ref 101–111)
CO2: 31 mmol/L (ref 22–32)
CREATININE: 0.9 mg/dL (ref 0.61–1.24)
Calcium: 8.6 mg/dL — ABNORMAL LOW (ref 8.9–10.3)
GFR calc Af Amer: >60 mL/min (ref >60)
GFR calc non Af Amer: >60 mL/min (ref >60)
GLUCOSE: 133 mg/dL — AB (ref 65–99)
Potassium: 3.9 mmol/L (ref 3.5–5.1)
Sodium: 135 mmol/L (ref 135–145)
Total Bilirubin: 0.8 mg/dL (ref 0.3–1.2)
Total Protein: 7 g/dL (ref 6.5–8.1)

## 2016-10-15 LAB — RAPID URINE DRUG SCREEN, HOSP PERFORMED
Amphetamines: NOT DETECTED
BARBITURATES: NOT DETECTED
Benzodiazepines: NOT DETECTED
Cocaine: NOT DETECTED
Opiates: NOT DETECTED
Tetrahydrocannabinol: NOT DETECTED

## 2016-10-15 LAB — CBG MONITORING, ED: GLUCOSE-CAPILLARY: 90 mg/dL (ref 65–99)

## 2016-10-15 MED ORDER — ENOXAPARIN SODIUM 40 MG/0.4ML ~~LOC~~ SOLN
40.0000 mg | SUBCUTANEOUS | Status: DC
  Administered 2016-10-15 – 2016-10-16 (×2): 40 mg via SUBCUTANEOUS
  Filled 2016-10-15 (×2): qty 0.4

## 2016-10-15 MED ORDER — LORAZEPAM 2 MG/ML IJ SOLN: 1.0000 mg | INTRAMUSCULAR | Status: DC | PRN

## 2016-10-15 MED ORDER — PANTOPRAZOLE SODIUM 40 MG PO TBEC
40.0000 mg | DELAYED_RELEASE_TABLET | Freq: Every day | ORAL | Status: DC
  Administered 2016-10-15 – 2016-10-17 (×3): 40 mg via ORAL
  Filled 2016-10-15 (×3): qty 1

## 2016-10-15 MED ORDER — ONDANSETRON HCL 4 MG/2ML IJ SOLN: 4.0000 mg | Freq: Four times a day (QID) | INTRAMUSCULAR | Status: DC | PRN

## 2016-10-15 MED ORDER — ASPIRIN 81 MG PO CHEW
324.0000 mg | CHEWABLE_TABLET | Freq: Once | ORAL | Status: AC
  Administered 2016-10-15: 324 mg via ORAL
  Filled 2016-10-15: qty 4

## 2016-10-15 MED ORDER — ACETAMINOPHEN 325 MG PO TABS: 650.0000 mg | ORAL_TABLET | ORAL | Status: DC | PRN

## 2016-10-15 MED ORDER — ACETAMINOPHEN 650 MG RE SUPP: 650.0000 mg | RECTAL | Status: DC | PRN

## 2016-10-15 MED ORDER — ONDANSETRON HCL 4 MG PO TABS: 4.0000 mg | ORAL_TABLET | Freq: Four times a day (QID) | ORAL | Status: DC | PRN

## 2016-10-15 MED ORDER — SODIUM CHLORIDE 0.9 % IV SOLN
75.0000 mL/h | INTRAVENOUS | Status: DC
  Administered 2016-10-15: 75 mL/h via INTRAVENOUS

## 2016-10-15 MED ORDER — ASPIRIN EC 81 MG PO TBEC
81.0000 mg | DELAYED_RELEASE_TABLET | Freq: Every day | ORAL | Status: DC
  Administered 2016-10-15 – 2016-10-17 (×3): 81 mg via ORAL
  Filled 2016-10-15 (×3): qty 1

## 2016-10-15 MED ORDER — ATORVASTATIN CALCIUM 40 MG PO TABS
40.0000 mg | ORAL_TABLET | Freq: Every day | ORAL | Status: DC
  Administered 2016-10-15 – 2016-10-16 (×2): 40 mg via ORAL
  Filled 2016-10-15 (×2): qty 1

## 2016-10-15 NOTE — ED Provider Notes (Signed)
Pt was d/c'd and was walking out to the waiting room.  As he was walking out, he stopped and dropped his cane.  He started having seizure like movements in his face and mouth.  He was unresponsive for a few minutes.  His body was stiff, and I had to hold him up.  By the time we got him into a wheelchair, he had stopped seizing.  His blood sugar was ok.  EKG repeat ok.  I will speak to the hospitalist for observation for new onset seizure and mental status change.  Pt d/w Dr. Lorin Mercy ([facility]) who will admit him for obs.  Pt d/w Dr. Merlene Laughter (neurology) who will see him while here.  No treatment recommended at this time.   Isla Pence, MD 10/15/16 [phone]

## 2016-10-15 NOTE — ED Notes (Signed)
This nurse went to discharge patient. Pt told this nurse the month was July, then he guessed august. After that I asked patient the year, he stated it was 1780.   MD Gilford Raid made aware.
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
## 2016-10-15 NOTE — ED Notes (Signed)
Pt signed signature pad for discharge and then walked away from desk. Pt was ambulatory up until the point. Pt began to get a glazed look in his eyes and was assisted to a wheel chair by Dr. Gilford Raid.
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
## 2016-10-15 NOTE — Discharge Instructions (Signed)
Continue to take aspirin as directed.  If you were given medicines take as directed.  If you are on coumadin or contraceptives realize their levels and effectiveness is altered by many different medicines.  If you have any reaction (rash, tongues swelling, other) to the medicines stop taking and see a physician.    If your blood pressure was elevated in the ER make sure you follow up for management with a primary doctor or return for chest pain, shortness of breath or stroke symptoms.  Please follow up as directed and return to the ER or see a physician for new or worsening symptoms.  Thank you. Vitals:   10/15/16 1113 10/15/16 1300 10/15/16 1400 10/15/16 1452  BP: 115/99 142/82 161/94   Pulse: 90 (!) 57 70   Resp: 19 14 15    Temp: 98.2 F (36.8 C)   98.2 F (36.8 C)  TempSrc: Oral     SpO2: 99% 97% 99%

## 2016-10-15 NOTE — ED Notes (Signed)
MD cleared patient for home with social work consult.

## 2016-10-15 NOTE — ED Notes (Signed)
CBG 101 by EMS

## 2016-10-15 NOTE — H&P (Signed)
History and Physical    Joshua Mckee [id number] DOB: [birth year] DOA: 10/15/2016  PCP: No PCP Per Patient Consultants:  None Patient coming from: home - lives with girlfriend; NOK:  Girlfriend (currently hospitalized at [facility])  Chief Complaint: seizure  HPI: Joshua Mckee is a 76 y.o. male with medical history significant of recent acute ischemic stroke (hospitalized 12/11-13), ETOH abuse, HTN, and GERD presenting after apparent witnessed seizure while leaving the ER (see below).  The patient is an extremely poor historian.  His account is as follows:  "Was down here on the first floor and they moved me up here.  I believe I come to the hospital I believe it was this mornig.  That woman she examined me and everything and she told me I had a, I had a, whatchacallem, I had a cough going on and stuff, and they checked me out and everything and they carried me to the emergency room and they put the earplugs on me and they pushed me up into the machine and it made funny noises and then they took me back out and they examined and everything and up here I is.  I was gonna go home last night and it looked like I had a fainting spell or something and two womens carried me back up and they told me I have to spend the night down here tonight."  History as per Dr. Reather Converse: Patient presents with EMS and police after being pulled over for driving the center of the road. Please report following him for significant amount of time despite putting on sirens and lites patient did not pull over. Patient was driving very slow. Eventually after being pulled over police had to knock on the window to get his attention and he was calm and unsure what was going on. Patient had a history of stroke in December. Patient says he recalls most of the events and felt he was driving an older road because he was trying to eat a sandwich. Addendum by Dr. Gilford Raid:  Pt was d/c'd and was walking out to the waiting room. As he was walking  out, he stopped and dropped his cane. He started having seizure like movements in his face and mouth. He was unresponsive for a few minutes. His body was stiff, and I had to hold him up. By the time we got him into a wheelchair, he had stopped seizing. His blood sugar was ok. EKG repeat ok.  ED Course:  Per Dr. Gilford Raid:  I will speak to the hospitalist for observation for new onset seizure and mental status change.  Pt d/w Dr. Lorin Mercy ([facility]) who will admit him for obs.  Pt d/w Dr. Merlene Laughter (neurology) who will see him while here. No treatment recommended at this time.   Review of Systems: As per HPI; otherwise 10 point review of systems reviewed and negative - although this history is definitely suspect based on patient's level of baseline cognitive function.  Ambulatory Status:   Ambulates with girlfriend's cane "but I don't really need no cane"  Past Medical History:  Diagnosis Date  . Erosive esophagitis   . Hiccups     Past Surgical History:  Procedure Laterality Date  . COLON SURGERY    . COLONOSCOPY N/A 03/08/2016   Procedure: COLONOSCOPY;  Surgeon: Daneil Dolin, MD;  Location: [facility];  Service: Endoscopy;  Laterality: N/A;  1300-moved to 1400  . ESOPHAGOGASTRODUODENOSCOPY N/A 10/05/2015   Dr. Gala Romney: erosive reflux esophagitis, non-critcal Schatki's ring  non-manipulated  . ESOPHAGOGASTRODUODENOSCOPY N/A 12/15/2015   Dr. Rourk:non bleeding esophageal ulcer/small HH  . ESOPHAGOGASTRODUODENOSCOPY N/A 03/08/2016   Procedure: ESOPHAGOGASTRODUODENOSCOPY (EGD);  Surgeon: Daneil Dolin, MD;  Location: [facility];  Service: Endoscopy;  Laterality: N/A;  . exploratory laparotomy     Dr. Tamala Julian 2006, SBO secondary to adhesions     Social History   Social History  . Marital status: Widowed    Spouse name: N/A  . Number of children: N/A  . Years of education: N/A   Occupational History  . retired    Social History Main Topics  . Smoking status: Never Smoker  . Smokeless  tobacco: Never Used  . Alcohol use 1.8 oz/week    3 Shots of liquor per week     Comment: "I used to drink whiskey.  But I left it alone."  But had a drink 2 days ago.  . Drug use: No  . Sexual activity: Yes   Other Topics Concern  . Not on file   Social History Narrative  . No narrative on file    No Known Allergies  Family History  Problem Relation Age of Onset  . Heart disease Brother   . Colon cancer Neg Hx     Prior to Admission medications   Medication Sig Start Date End Date Taking? Authorizing Provider  aspirin EC 81 MG EC tablet Take 1 tablet (81 mg total) by mouth daily. 08/30/16  Yes Erline Hau, MD  atorvastatin (LIPITOR) 40 MG tablet Take 1 tablet (40 mg total) by mouth daily at 6 PM. 08/29/16  Yes Estela Leonie Green, MD  Multiple Vitamin (MULTIVITAMIN WITH MINERALS) TABS tablet Take 1 tablet by mouth daily. Patient not taking: Reported on 10/15/2016 08/30/16   Erline Hau, MD  pantoprazole (PROTONIX) 40 MG tablet Take 1 tablet (40 mg total) by mouth daily. 10/11/16   Carlis Stable, NP    Physical Exam: Vitals:   10/15/16 1628 10/15/16 1711 10/15/16 1830 10/15/16 1911  BP: 123/65 149/97 142/89 (!) 136/94  Pulse: 70 72 (!) 59 70  Resp: 18 16 18 20   Temp:    98.3 F (36.8 C)  TempSrc:    Oral  SpO2: 100% 98% 100% 100%  Weight:    70.5 kg (155 lb 6.4 oz)  Height:    5\' 5"  (1.651 m)     General:  Appears calm and comfortable and is NAD Eyes:  EOMI, normal lids, iris ENT:  grossly normal hearing, lips & tongue, mmm Neck:  no LAD, masses or thyromegaly Cardiovascular:  RRR, no m/r/g. No LE edema.  Respiratory:  CTA bilaterally, no w/r/r. Normal respiratory effort. Abdomen:  soft, ntnd, NABS Skin:  no rash or induration seen on limited exam Musculoskeletal:  grossly normal tone BUE/BLE, good ROM, no bony abnormality Psychiatric:  grossly normal mood and affect, speech fluent and appropriate, AOx3.  He has baseline cognitive  impairment with intellectual deficiency. Neurologic:  CN 2-12 grossly intact, moves all extremities in coordinated fashion, sensation intact  Labs on Admission: I have personally reviewed following labs and imaging studies  CBC:  Recent Labs Lab 10/15/16 1120  WBC 6.1  HGB 14.2  HCT 42.1  MCV 87.7  PLT 123456   Basic Metabolic Panel:  Recent Labs Lab 10/15/16 1120  NA 135  K 3.9  CL 99*  CO2 31  GLUCOSE 133*  BUN 11  CREATININE 0.90  CALCIUM 8.6*   GFR: Estimated Creatinine  Clearance: 59.8 mL/min (by C-G formula based on SCr of 0.9 mg/dL). Liver Function Tests:  Recent Labs Lab 10/15/16 1120  AST 27  ALT 19  ALKPHOS 72  BILITOT 0.8  PROT 7.0  ALBUMIN 3.7   No results for input(s): LIPASE, AMYLASE in the last 168 hours. No results for input(s): AMMONIA in the last 168 hours. Coagulation Profile: No results for input(s): INR, PROTIME in the last 168 hours. Cardiac Enzymes: No results for input(s): CKTOTAL, CKMB, CKMBINDEX, TROPONINI in the last 168 hours. BNP (last 3 results) No results for input(s): PROBNP in the last 8760 hours. HbA1C: No results for input(s): HGBA1C in the last 72 hours. CBG:  Recent Labs Lab 10/15/16 1711  GLUCAP 90   Lipid Profile: No results for input(s): CHOL, HDL, LDLCALC, TRIG, CHOLHDL, LDLDIRECT in the last 72 hours. Thyroid Function Tests: No results for input(s): TSH, T4TOTAL, FREET4, T3FREE, THYROIDAB in the last 72 hours. Anemia Panel: No results for input(s): VITAMINB12, FOLATE, FERRITIN, TIBC, IRON, RETICCTPCT in the last 72 hours. Urine analysis:    Component Value Date/Time   COLORURINE STRAW (A) 10/15/2016 1336   APPEARANCEUR CLEAR 10/15/2016 1336   LABSPEC 1.008 10/15/2016 1336   PHURINE 7.0 10/15/2016 1336   GLUCOSEU NEGATIVE 10/15/2016 1336   HGBUR NEGATIVE 10/15/2016 1336   BILIRUBINUR NEGATIVE 10/15/2016 1336   KETONESUR NEGATIVE 10/15/2016 1336   PROTEINUR NEGATIVE 10/15/2016 1336   UROBILINOGEN 0.2  08/23/2012 1615   NITRITE NEGATIVE 10/15/2016 1336   LEUKOCYTESUR NEGATIVE 10/15/2016 1336    Creatinine Clearance: Estimated Creatinine Clearance: 59.8 mL/min (by C-G formula based on SCr of 0.9 mg/dL).  Sepsis Labs: @LABRCNTIP (procalcitonin:4,lacticidven:4) )No results found for this or any previous visit (from the past 240 hour(s)).   Radiological Exams on Admission: Mr Brain Wo Contrast  Result Date: 10/15/2016 CLINICAL DATA:  Weakness and confusion for 1 day. Transient altered mental status. EXAM: MRI HEAD WITHOUT CONTRAST TECHNIQUE: Multiplanar, multiecho pulse sequences of the brain and surrounding structures were obtained without intravenous contrast. COMPARISON:  08/28/2016 FINDINGS: Brain: No acute infarction, hemorrhage, hydrocephalus, extra-axial collection or mass lesion. Late subacute lacunar infarct in the right thalamus. Residual DWI hyperintensity at this level is primarily attributed to shine through. The area of signal abnormality has diminished from decreasing edema. Chronic ventriculomegaly without notable temporal horn ballooning or increase in callosal angle, favor ex vacuo ventricular enlargement. Vascular: Loss of normal flow void and in the left vertebral artery just before entering the dura. The intradural segment has normal appearance, arguing against complete occlusion at the V3 segment. Skull and upper cervical spine: Negative Sinuses/Orbits: Negative IMPRESSION: 1. No acute finding, including infarct. 2. Short segment of lost flow void in the left V3 segment favoring artifact or slow flow. The intracranial segment has a normalized appearance. 3. Stable ventriculomegaly as described. Electronically Signed   By: Monte Fantasia M.D.   On: 10/15/2016 14:43    EKG: Independently reviewed.  NSR with rate 59; LVH with no evidence of acute ischemia  Assessment/Plan Principal Problem:   Seizure (HCC) Active Problems:   Reflux esophagitis   ETOH abuse   Hypertension    H/O: CVA (cerebrovascular accident)   Hyperglycemia   Seizure -Patient brought in by police after erratic driving and eccentric behavior -Recent CVA, so concern for recurrence - but CT and MRI both negative -Patient was discharged and walking out of the ER when he had an apparent witnessed seizure by Dr. Gilford Raid -Patient placed in observation overnight for further evaluation -It  would not be unexpected for a patient to have post-CVA seizure activity -Dr. Merlene Laughter to see patient tomorrow -He will likely need EEG -He also will likely need driving restriction for at least 6 months -Currently, there is no plan to load with anti-epileptic medication; if he seizes again, he is likely to need this -Seizure precautions -Ativan prn -He denies persistent ETOH use (see below) but withdrawal could be a cause for seizure; he does not appear to be anxious at this time and has normal vital signs.   -UDS negative  GERD -Continue Protonix  ETOH abuse -Denies current use, acknowledges drinking a mini-bottle 2 days ago -Does not appear anxious or have abnormal vital signs -No CIWA protocol for now -Will check GGT  HLD -LDL 103 on 12/12 -Started on Lipitor -Will continue  HTN -Mild HTN while in ER but generally at goal for age -No treatment for now but may need to consider prior to discharge  H/o CVA -Post-CVA seizure activity would not be unusual -Will continue ASA daily and RF control as above and below  Hyperglycemia -Glucose 133 -Recent A1c 5.7 -No monitoring/treatment at this time  DVT prophylaxis: Lovenox  Code Status:  Full - confirmed with patient Family Communication: None present  Disposition Plan:  Home once clinically improved Consults called: Neurology - to see in AM  Admission status: It is my clinical opinion that referral for OBSERVATION is reasonable and necessary in this patient based on the above information provided. The aforementioned taken together are felt to  place the patient at high risk for further clinical deterioration. However it is anticipated that the patient may be medically stable for discharge from the hospital within 24 to 48 hours.     Karmen Bongo MD [facility]  If 7PM-7AM, please contact night-coverage [email or web address] Password [email or web address]  10/15/2016, 8:09 PM

## 2016-10-15 NOTE — ED Provider Notes (Signed)
Pt d/c'd by Dr. Reather Converse, but nursing had some concerns about pt going home.  When the nurse spoke with pt, he was a little confused and unsure of the month or year.  When I spoke with him, he was able to tell me his address and what happened this afternoon to cause him to be brought to the ED.  I suspect pt has some mild dementia.  I will consult SW.  I completed a face to face form for some home health.  Pt wants to go home.  At this time, I feel no emergent need for admission.   Isla Pence, MD 10/15/16 1655

## 2016-10-16 ENCOUNTER — Observation Stay (HOSPITAL_COMMUNITY)
Admit: 2016-10-16 | Discharge: 2016-10-16 | Disposition: A | Discharge status: Home / Self Care | Payer: Medicare Other | Attending: Neurology | Admitting: Neurology

## 2016-10-16 DIAGNOSIS — R9401 Abnormal electroencephalogram [EEG]: Secondary | ICD-10-CM | POA: Diagnosis present

## 2016-10-16 DIAGNOSIS — I10 Essential (primary) hypertension: Secondary | ICD-10-CM | POA: Diagnosis not present

## 2016-10-16 DIAGNOSIS — Z8719 Personal history of other diseases of the digestive system: Secondary | ICD-10-CM | POA: Diagnosis not present

## 2016-10-16 DIAGNOSIS — K210 Gastro-esophageal reflux disease with esophagitis: Secondary | ICD-10-CM | POA: Diagnosis present

## 2016-10-16 DIAGNOSIS — R4182 Altered mental status, unspecified: Secondary | ICD-10-CM | POA: Diagnosis present

## 2016-10-16 DIAGNOSIS — R569 Unspecified convulsions: Secondary | ICD-10-CM | POA: Diagnosis present

## 2016-10-16 DIAGNOSIS — E785 Hyperlipidemia, unspecified: Secondary | ICD-10-CM | POA: Diagnosis present

## 2016-10-16 DIAGNOSIS — F1021 Alcohol dependence, in remission: Secondary | ICD-10-CM | POA: Diagnosis present

## 2016-10-16 DIAGNOSIS — Z8249 Family history of ischemic heart disease and other diseases of the circulatory system: Secondary | ICD-10-CM | POA: Diagnosis not present

## 2016-10-16 DIAGNOSIS — G9389 Other specified disorders of brain: Secondary | ICD-10-CM | POA: Diagnosis present

## 2016-10-16 DIAGNOSIS — Z79899 Other long term (current) drug therapy: Secondary | ICD-10-CM | POA: Diagnosis not present

## 2016-10-16 DIAGNOSIS — R404 Transient alteration of awareness: Secondary | ICD-10-CM | POA: Diagnosis not present

## 2016-10-16 DIAGNOSIS — G40201 Localization-related (focal) (partial) symptomatic epilepsy and epileptic syndromes with complex partial seizures, not intractable, with status epilepticus: Secondary | ICD-10-CM | POA: Diagnosis present

## 2016-10-16 DIAGNOSIS — Z7982 Long term (current) use of aspirin: Secondary | ICD-10-CM | POA: Diagnosis not present

## 2016-10-16 DIAGNOSIS — E512 Wernicke's encephalopathy: Secondary | ICD-10-CM | POA: Diagnosis present

## 2016-10-16 DIAGNOSIS — R739 Hyperglycemia, unspecified: Secondary | ICD-10-CM | POA: Diagnosis not present

## 2016-10-16 DIAGNOSIS — Z8673 Personal history of transient ischemic attack (TIA), and cerebral infarction without residual deficits: Secondary | ICD-10-CM | POA: Diagnosis not present

## 2016-10-16 LAB — GAMMA GT: GGT: 21 U/L (ref 7–50)

## 2016-10-16 MED ORDER — LEVETIRACETAM 500 MG PO TABS
500.0000 mg | ORAL_TABLET | Freq: Two times a day (BID) | ORAL | Status: DC
  Administered 2016-10-16 – 2016-10-17 (×2): 500 mg via ORAL
  Filled 2016-10-16 (×2): qty 1

## 2016-10-16 MED ORDER — LEVETIRACETAM IN NACL 1000 MG/100ML IV SOLN
1000.0000 mg | Freq: Once | INTRAVENOUS | Status: AC
  Administered 2016-10-16: 1000 mg via INTRAVENOUS
  Filled 2016-10-16 (×2): qty 100

## 2016-10-16 NOTE — Clinical Social Work Note (Addendum)
Clinical Social Work Assessment  Patient Details  Name: Joshua Mckee MRN: [id number] Date of Birth: 1941/07/27  Date of referral:  10/16/16               Reason for consult:  Discharge Planning                Permission sought to share information with:    Permission granted to share information::     Name::        Agency::     Relationship::     Contact Information:     Housing/Transportation Living arrangements for the past 2 months:  Apartment Source of Information:  Patient Patient Interpreter Needed:  None Criminal Activity/Legal Involvement Pertinent to Current Situation/Hospitalization:  No - Comment as needed Significant Relationships:  Adult Children, Significant Other Lives with:  Significant Other Do you feel safe going back to the place where you live?  Yes Need for family participation in patient care:  Yes (Comment)  Care giving concerns: Independent at baseline.    Social Worker assessment / plan:  Patient stated that he lives with his wife, Joshua Mckee. He states that he ambulates with a cane or independently. Patient also  Drives. Patient's plan is to return home. He is somewhat confused. LCSW contacted Joshua Mckee and left a voicemail message. LCSW contact Joshua Mckee and received a recording indicating the wireless customer is not available. Patient could not recall any additional family member numbers.   LCSW will continue to attempt to locate family.    Employment status:  Retired Forensic scientist:  Medicare PT Recommendations:  Not assessed at this time Information / Referral to community resources:     Patient/Family's Response to care:  Patient desires to go home at discharge.   Patient/Family's Understanding of and Emotional Response to Diagnosis, Current Treatment, and Prognosis:  Patient is somewhat confused.   Emotional Assessment Appearance:  Appears stated age Attitude/Demeanor/Rapport:   (Cooperative/very pleasant) Affect  (typically observed):  Calm Orientation:  Oriented to Self, Oriented to Place Alcohol / Substance use:  Not Applicable Psych involvement (Current and /or in the community):  No (Comment)  Discharge Needs  Concerns to be addressed:  No discharge needs identified Readmission within the last 30 days:  No Current discharge risk:  None Barriers to Discharge:  No Barriers Identified   Ihor Gully, LCSW 10/16/2016, 11:12 AM

## 2016-10-16 NOTE — Progress Notes (Signed)
PROGRESS NOTE    Joshua Mckee  [id number] DOB: Feb 02, 1941 DOA: 10/15/2016 PCP: No PCP Per Patient     Brief Narrative:  Patient is a 76 year old man admitted to the hospital on [id number] after the police found him confused driving slowly in the middle of the road. He was brought to the hospital, MRI was negative and was discharged home. While leaving the emergency department he was found to have a seizure and admission was requested for observation. At around 7 AM on 1/30 a code stroke was called on the patient due to acute change in mental status. By the time I assessed the patient his mental status and neurologic changes were already resolving. Dr. Merlene Laughter was at bedside and canceled CT scan and believed this is most likely due to partial seizures and has been started on Keppra.   Assessment & Plan:   Principal Problem:   Seizure ([place]) Active Problems:   Reflux esophagitis   ETOH abuse   Hypertension   H/O: CVA (cerebrovascular accident)   Hyperglycemia   Seizures -EEG requested. -Has been loaded on Keppra and started on maintenance of 500 mg twice a day. -Appreciate neurology input and recommendations. -We'll likely need driving restrictions.  Hyperlipidemia -Continue statin  History of CVA   DVT prophylaxis: Lovenox  Code Status: Full code  Family Communication: Patient only  Disposition Plan: Hope for discharge home in 24-48 hours  Consultants:  Neurology  Procedures:  EEG  Antimicrobials:  Anti-infectives    None       Subjective: Upon initial examination and able to verbalize complaints, was later able to follow commands more appropriately   Objective: Vitals:   10/15/16 1911 10/15/16 2255 10/16/16 0622 10/16/16 1407  BP: (!) 136/94 134/89 116/72 114/80  Pulse: 70 65 (!) 57 84  Resp: 20 20 20 20   Temp: 98.3 F (36.8 C) 98.3 F (36.8 C) 98.7 F (37.1 C) 98.6 F (37 C)  TempSrc: Oral Oral Other (Comment) Oral  SpO2: 100% 99% 99% 99%  Weight:  70.5 kg (155 lb 6.4 oz)     Height: 5\' 5"  (1.651 m)       Intake/Output Summary (Last 24 hours) at 10/16/16 1624 Last data filed at 10/16/16 1300  Gross per 24 hour  Intake           1399.5 ml  Output              150 ml  Net           1249.5 ml   Filed Weights   10/15/16 1911  Weight: 70.5 kg (155 lb 6.4 oz)    Examination:  General exam: Alert, awake, oriented x 3 Respiratory system: Clear to auscultation. Respiratory effort normal. Cardiovascular system:RRR. No murmurs, rubs, gallops. Gastrointestinal system: Abdomen is nondistended, soft and nontender. No organomegaly or masses felt. Normal bowel sounds heard. Central nervous system: Alert and oriented. No focal neurological deficits. Extremities: No C/C/E, +pedal pulses Skin: No rashes, lesions or ulcers Psychiatry: Judgement and insight appear normal. Mood & affect appropriate.     Data Reviewed: I have personally reviewed following labs and imaging studies  CBC:  Recent Labs Lab 10/15/16 1120  WBC 6.1  HGB 14.2  HCT 42.1  MCV 87.7  PLT 123456   Basic Metabolic Panel:  Recent Labs Lab 10/15/16 1120  NA 135  K 3.9  CL 99*  CO2 31  GLUCOSE 133*  BUN 11  CREATININE 0.90  CALCIUM 8.6*   GFR: Estimated  Creatinine Clearance: 59.8 mL/min (by C-G formula based on SCr of 0.9 mg/dL). Liver Function Tests:  Recent Labs Lab 10/15/16 1120  AST 27  ALT 19  ALKPHOS 72  BILITOT 0.8  PROT 7.0  ALBUMIN 3.7   No results for input(s): LIPASE, AMYLASE in the last 168 hours. No results for input(s): AMMONIA in the last 168 hours. Coagulation Profile: No results for input(s): INR, PROTIME in the last 168 hours. Cardiac Enzymes: No results for input(s): CKTOTAL, CKMB, CKMBINDEX, TROPONINI in the last 168 hours. BNP (last 3 results) No results for input(s): PROBNP in the last 8760 hours. HbA1C: No results for input(s): HGBA1C in the last 72 hours. CBG:  Recent Labs Lab 10/15/16 1711  GLUCAP 90   Lipid  Profile: No results for input(s): CHOL, HDL, LDLCALC, TRIG, CHOLHDL, LDLDIRECT in the last 72 hours. Thyroid Function Tests: No results for input(s): TSH, T4TOTAL, FREET4, T3FREE, THYROIDAB in the last 72 hours. Anemia Panel: No results for input(s): VITAMINB12, FOLATE, FERRITIN, TIBC, IRON, RETICCTPCT in the last 72 hours. Urine analysis:    Component Value Date/Time   COLORURINE STRAW (A) 10/15/2016 1336   APPEARANCEUR CLEAR 10/15/2016 1336   LABSPEC 1.008 10/15/2016 1336   PHURINE 7.0 10/15/2016 1336   GLUCOSEU NEGATIVE 10/15/2016 1336   HGBUR NEGATIVE 10/15/2016 [place] 10/15/2016 1336   KETONESUR NEGATIVE 10/15/2016 1336   PROTEINUR NEGATIVE 10/15/2016 1336   UROBILINOGEN 0.2 08/23/2012 1615   NITRITE NEGATIVE 10/15/2016 1336   LEUKOCYTESUR NEGATIVE 10/15/2016 1336   Sepsis Labs: @LABRCNTIP (procalcitonin:4,lacticidven:4)  )No results found for this or any previous visit (from the past 240 hour(s)).       Radiology Studies: Mr Brain Wo Contrast  Result Date: 10/15/2016 CLINICAL DATA:  Weakness and confusion for 1 day. Transient altered mental status. EXAM: MRI HEAD WITHOUT CONTRAST TECHNIQUE: Multiplanar, multiecho pulse sequences of the brain and surrounding structures were obtained without intravenous contrast. COMPARISON:  08/28/2016 FINDINGS: Brain: No acute infarction, hemorrhage, hydrocephalus, extra-axial collection or mass lesion. Late subacute lacunar infarct in the right thalamus. Residual DWI hyperintensity at this level is primarily attributed to shine through. The area of signal abnormality has diminished from decreasing edema. Chronic ventriculomegaly without notable temporal horn ballooning or increase in callosal angle, favor ex vacuo ventricular enlargement. Vascular: Loss of normal flow void and in the left vertebral artery just before entering the dura. The intradural segment has normal appearance, arguing against complete occlusion at the  V3 segment. Skull and upper cervical spine: Negative Sinuses/Orbits: Negative IMPRESSION: 1. No acute finding, including infarct. 2. Short segment of lost flow void in the left V3 segment favoring artifact or slow flow. The intracranial segment has a normalized appearance. 3. Stable ventriculomegaly as described. Electronically Signed   By: Monte Fantasia M.D.   On: 10/15/2016 14:43        Scheduled Meds: . aspirin EC  81 mg Oral Daily  . atorvastatin  40 mg Oral q1800  . enoxaparin (LOVENOX) injection  40 mg Subcutaneous Q24H  . levETIRAcetam  500 mg Oral BID  . pantoprazole  40 mg Oral Daily   Continuous Infusions: . sodium chloride 75 mL/hr (10/15/16 2121)     LOS: 0 days    Time spent: 25 minutes. Greater than 50% of this time was spent in direct contact with the patient coordinating care.     Lelon Frohlich, MD [facility] Pager [phone]  If 7PM-7AM, please contact night-coverage [email or web address] Password [email or web address] 10/16/2016, 4:24 PM

## 2016-10-16 NOTE — Progress Notes (Signed)
EEG Completed; Results Pending  

## 2016-10-16 NOTE — Consult Note (Signed)
Regal A. Merlene Laughter, MD     [email or web address]          Joshua Mckee is an 76 y.o. male.   ASSESSMENT/PLAN: The patient presents with recurrent episodes of confusion and altered mental status with at least 1 event of a generalized tonoclonic seizure. The overall picture is consistent with multiple complex partial seizures and unwitnessed generalized seizure. The patient meets the criteria for complex partial status epilepticus. Consequently, the patient will be loaded with Keppra. An EEG will be obtained. We will also patient patient on maintenance dose of Keppra 500 mg twice a day.    This is a 76 year old black male who was found by the police to be driving erratically and confused. He was sent to the emergency room and had an extensive workup included MRI which were all unrevealing. In the process of being discharged from the hospital, the patient was noted to have a generalized tonoclonic seizure. He was subsequently admitted to the hospital for further evaluation. There is no previous history of seizures or stroke. The patient has been on the floor and the was at baseline. During rounds today a stroke code was called because the patient had an abrupt change in his mentation. He was unresponsive. Evaluation was done by myself in the hospital is Dr. Jerilee Hoh. The patient improved over the course of several minutes. We decided to discontinue the stroke call and cancel the CT scan is overall picture seems to be a progression of his multiple episodes of confusion. The patient is now following commands but is still confused.  GENERAL: He is breathing well and is in no acute distress although confused.  HEENT: Supple. Atraumatic normocephalic.   ABDOMEN: soft  EXTREMITIES: No edema. There is marked arthritic changes especially at the knees bilaterally.   BACK: Normal.  SKIN: Normal by inspection.    MENTAL STATUS: He is awake and alert at this point and continues to  improve. He does follow commands bilaterally although prompting is required. He does laugh and repeat things. Speech is at times nonsensical. No dysarthria is appreciated.  CRANIAL NERVES: Pupils are equal, round and reactive to light and accommodation; extra ocular movements are full, there is no significant nystagmus; visual fields are full; upper and lower facial muscles are normal in strength and symmetric, there is no flattening of the nasolabial folds; tongue is midline; uvula is midline; shoulder elevation is normal.  MOTOR: Normal tone, bulk and strength; no pronator drift.  COORDINATION: Left finger to nose is normal, right finger to nose is normal, No rest tremor; no intention tremor; no postural tremor; no bradykinesia.  REFLEXES: Deep tendon reflexes are symmetrical and normal although they're diminished at the knees bilaterally. Babinski reflexes are flexor bilaterally.   SENSATION: Normal to light touch.    Blood pressure 116/72, pulse (!) 57, temperature 98.7 F (37.1 C), temperature source Other (Comment), resp. rate 20, height '5\' 5"'  (1.651 m), weight 155 lb 6.4 oz (70.5 kg), SpO2 99 %.  Past Medical History:  Diagnosis Date  . Erosive esophagitis   . Hiccups     Past Surgical History:  Procedure Laterality Date  . COLON SURGERY    . COLONOSCOPY N/A 03/08/2016   Procedure: COLONOSCOPY;  Surgeon: Daneil Dolin, MD;  Location: [facility];  Service: Endoscopy;  Laterality: N/A;  1300-moved to 1400  . ESOPHAGOGASTRODUODENOSCOPY N/A 10/05/2015   Dr. Gala Romney: erosive reflux esophagitis, non-critcal Schatki's ring non-manipulated  . ESOPHAGOGASTRODUODENOSCOPY N/A 12/15/2015   Dr. Ellouise Newer  bleeding esophageal ulcer/small HH  . ESOPHAGOGASTRODUODENOSCOPY N/A 03/08/2016   Procedure: ESOPHAGOGASTRODUODENOSCOPY (EGD);  Surgeon: Daneil Dolin, MD;  Location: [facility];  Service: Endoscopy;  Laterality: N/A;  . exploratory laparotomy     Dr. Tamala Julian 2006, SBO secondary to  adhesions     Family History  Problem Relation Age of Onset  . Heart disease Brother   . Colon cancer Neg Hx     Social History:  reports that he has never smoked. He has never used smokeless tobacco. He reports that he drinks about 1.8 oz of alcohol per week . He reports that he does not use drugs.  Allergies: No Known Allergies  Medications: Prior to Admission medications   Medication Sig Start Date End Date Taking? Authorizing Provider  aspirin EC 81 MG EC tablet Take 1 tablet (81 mg total) by mouth daily. 08/30/16  Yes Erline Hau, MD  atorvastatin (LIPITOR) 40 MG tablet Take 1 tablet (40 mg total) by mouth daily at 6 PM. 08/29/16  Yes Estela Leonie Green, MD  pantoprazole (PROTONIX) 40 MG tablet Take 1 tablet (40 mg total) by mouth daily. 10/11/16   Carlis Stable, NP    Scheduled Meds: . aspirin EC  81 mg Oral Daily  . atorvastatin  40 mg Oral q1800  . enoxaparin (LOVENOX) injection  40 mg Subcutaneous Q24H  . pantoprazole  40 mg Oral Daily   Continuous Infusions: . sodium chloride 75 mL/hr (10/15/16 2121)   PRN Meds:.acetaminophen **OR** acetaminophen, LORazepam, ondansetron **OR** ondansetron (ZOFRAN) IV     Results for orders placed or performed during the hospital encounter of 10/15/16 (from the past 48 hour(s))  Comprehensive metabolic panel     Status: Abnormal   Collection Time: 10/15/16 11:20 AM  Result Value Ref Range   Sodium 135 135 - 145 mmol/L   Potassium 3.9 3.5 - 5.1 mmol/L   Chloride 99 (L) 101 - 111 mmol/L   CO2 31 22 - 32 mmol/L   Glucose, Bld 133 (H) 65 - 99 mg/dL   BUN 11 6 - 20 mg/dL   Creatinine, Ser 0.90 0.61 - 1.24 mg/dL   Calcium 8.6 (L) 8.9 - 10.3 mg/dL   Total Protein 7.0 6.5 - 8.1 g/dL   Albumin 3.7 3.5 - 5.0 g/dL   AST 27 15 - 41 U/L   ALT 19 17 - 63 U/L   Alkaline Phosphatase 72 38 - 126 U/L   Total Bilirubin 0.8 0.3 - 1.2 mg/dL   GFR calc non Af Amer >60 >60 mL/min   GFR calc Af Amer >60 >60 mL/min    Comment:  (NOTE) The eGFR has been calculated using the CKD EPI equation. This calculation has not been validated in all clinical situations. eGFR's persistently <60 mL/min signify possible Chronic Kidney Disease.    Anion gap 5 5 - 15  CBC     Status: None   Collection Time: 10/15/16 11:20 AM  Result Value Ref Range   WBC 6.1 4.0 - 10.5 K/uL   RBC 4.80 4.22 - 5.81 MIL/uL   Hemoglobin 14.2 13.0 - 17.0 g/dL   HCT 42.1 39.0 - 52.0 %   MCV 87.7 78.0 - 100.0 fL   MCH 29.6 26.0 - 34.0 pg   MCHC 33.7 30.0 - 36.0 g/dL   RDW 13.5 11.5 - 15.5 %   Platelets 201 150 - 400 K/uL  Urinalysis, Routine w reflex microscopic     Status: Abnormal   Collection  Time: 10/15/16  1:36 PM  Result Value Ref Range   Color, Urine STRAW (A) YELLOW   APPearance CLEAR CLEAR   Specific Gravity, Urine 1.008 1.005 - 1.030   pH 7.0 5.0 - 8.0   Glucose, UA NEGATIVE NEGATIVE mg/dL   Hgb urine dipstick NEGATIVE NEGATIVE   Bilirubin Urine NEGATIVE NEGATIVE   Ketones, ur NEGATIVE NEGATIVE mg/dL   Protein, ur NEGATIVE NEGATIVE mg/dL   Nitrite NEGATIVE NEGATIVE   Leukocytes, UA NEGATIVE NEGATIVE  Rapid urine drug screen (hospital performed)     Status: None   Collection Time: 10/15/16  1:36 PM  Result Value Ref Range   Opiates NONE DETECTED NONE DETECTED   Cocaine NONE DETECTED NONE DETECTED   Benzodiazepines NONE DETECTED NONE DETECTED   Amphetamines NONE DETECTED NONE DETECTED   Tetrahydrocannabinol NONE DETECTED NONE DETECTED   Barbiturates NONE DETECTED NONE DETECTED    Comment:        DRUG SCREEN FOR MEDICAL PURPOSES ONLY.  IF CONFIRMATION IS NEEDED FOR ANY PURPOSE, NOTIFY LAB WITHIN 5 DAYS.        LOWEST DETECTABLE LIMITS FOR URINE DRUG SCREEN Drug Class       Cutoff (ng/mL) Amphetamine      1000 Barbiturate      200 Benzodiazepine   128 Tricyclics       786 Opiates          300 Cocaine          300 THC              50   POC CBG, ED     Status: None   Collection Time: 10/15/16  5:11 PM  Result Value  Ref Range   Glucose-Capillary 90 65 - 99 mg/dL    Studies/Results:  BRAIN MRI FINDINGS: Brain: No acute infarction, hemorrhage, hydrocephalus, extra-axial collection or mass lesion.  Late subacute lacunar infarct in the right thalamus. Residual DWI hyperintensity at this level is primarily attributed to shine through. The area of signal abnormality has diminished from decreasing edema.  Chronic ventriculomegaly without notable temporal horn ballooning or increase in callosal angle, favor ex vacuo ventricular enlargement.  Vascular: Loss of normal flow void and in the left vertebral artery just before entering the dura. The intradural segment has normal appearance, arguing against complete occlusion at the V3 segment.  Skull and upper cervical spine: Negative  Sinuses/Orbits: Negative  IMPRESSION: 1. No acute finding, including infarct. 2. Short segment of lost flow void in the left V3 segment favoring artifact or slow flow. The intracranial segment has a normalized appearance. 3. Stable ventriculomegaly as described.           BRAIN MRA 08/2016: FINDINGS: Antegrade flow in the posterior circulation with codominant distal vertebral arteries. No distal vertebral stenosis, artifactual signal loss in the V4 segments. Normal PICA origin on the right. Patent vertebrobasilar junction. Mild basilar artery irregularity without stenosis. Normal SCA and PCA origins. Posterior communicating arteries are diminutive or absent. Artifactual signal loss in both PCA P2 segments. Bilateral PCA branches are felt to be within normal limits.  Antegrade flow in both ICA siphons. Mild siphon irregularity without stenosis. Normal ophthalmic artery origins. Normal carotid termini. Normal MCA and ACA origins mildly to moderately tortuous bilateral ACA is. Anterior communicating artery diminutive or absent. Bilateral MCA M1 segments, MCA bifurcations, and visualized bilateral  MCA branches are within normal limits.  IMPRESSION: Normal for age intracranial MRA.      CAROTID DOPPLERS RIGHT  ICA:  48/19 cm/sec  CCA:  563/89 cm/sec  SYSTOLIC ICA/CCA RATIO:  0.8  DIASTOLIC ICA/CCA RATIO:  1.5  ECA:  57 cm/sec  LEFT  ICA:  46/21 cm/sec  CCA:  37/34 cm/sec  SYSTOLIC ICA/CCA RATIO:  0.9  DIASTOLIC ICA/CCA RATIO:  1.7  ECA:  39 cm/sec  RIGHT CAROTID ARTERY: Normal sonographic appearance of the right carotid system. There are no elevated peak systolic velocities within the interrogated course of the right internal carotid artery to suggest a hemodynamically significant stenosis.  RIGHT VERTEBRAL ARTERY:  Antegrade Flow  LEFT CAROTID ARTERY: Normal sonographic appearance of the left carotid system. There are no elevated peak systolic velocities within the interrogated course of the left internal carotid artery to suggest a hemodynamically significant stenosis.  LEFT VERTEBRAL ARTERY:  Antegrade Flow  IMPRESSION: Normal carotid Doppler ultrasound.         Yanky Vanderburg A. Merlene Laughter, M.D.  Diplomate, Tax adviser of Psychiatry and Neurology ( Neurology). 10/16/2016, 7:55 AM

## 2016-10-16 NOTE — Progress Notes (Signed)
MEDICATION RELATED CONSULT NOTE - INITIAL   Pharmacy Consult for Keppra Indication: Anti-epileptic new medication  No Known Allergies  Patient Measurements: Height: 5\' 5"  (165.1 cm) Weight: 155 lb 6.4 oz (70.5 kg) IBW/kg (Calculated) : 61.5  Vital Signs: Temp: 98.7 F (37.1 C) (01/30 0622) Temp Source: Other (Comment) (01/30 0622) BP: 116/72 (01/30 0622) Pulse Rate: 57 (01/30 0622) Intake/Output from previous day: 01/29 0701 - 01/30 0700 In: 607 [I.V.:607] Out: -  Intake/Output from this shift: No intake/output data recorded.  Labs:  Recent Labs  10/15/16 1120  WBC 6.1  HGB 14.2  HCT 42.1  PLT 201  CREATININE 0.90  ALBUMIN 3.7  PROT 7.0  AST 27  ALT 19  ALKPHOS 72  BILITOT 0.8   Estimated Creatinine Clearance: 59.8 mL/min (by C-G formula based on SCr of 0.9 mg/dL).   Microbiology: No results found for this or any previous visit (from the past 720 hour(s)).  Medical History: Past Medical History:  Diagnosis Date  . Erosive esophagitis   . Hiccups     Medications:  Prescriptions Prior to Admission  Medication Sig Dispense Refill Last Dose  . aspirin EC 81 MG EC tablet Take 1 tablet (81 mg total) by mouth daily.   10/14/2016 at Unknown time  . atorvastatin (LIPITOR) 40 MG tablet Take 1 tablet (40 mg total) by mouth daily at 6 PM. 30 tablet 2 10/14/2016 at Unknown time  . pantoprazole (PROTONIX) 40 MG tablet Take 1 tablet (40 mg total) by mouth daily. 60 tablet 5   . [DISCONTINUED] Multiple Vitamin (MULTIVITAMIN WITH MINERALS) TABS tablet Take 1 tablet by mouth daily. (Patient not taking: Reported on 10/15/2016)   Not Taking at Unknown time    Assessment: 76 yo male with newly diagnosis of with multiple complex partial seizures and unwitnessed generalized seizure. Patient is initiated on Keppra 1000mg  Load, then 500mg  BID.  Current meds reviewed for drug interactions: aspirin, lipitor, and protonix. No identified drug/drug interactions found.   Goal of  Therapy:  Resolution of seizures  Plan:  Continue Keppra 500mg  po BID Monitor for any seizure activity  Thanks for the opportunity to participate in the care of this patient,  Isac Sarna, BS Vena Austria, BCPS Clinical Pharmacist Pager [phone] 10/16/2016,12:57 PM

## 2016-10-17 DIAGNOSIS — R569 Unspecified convulsions: Secondary | ICD-10-CM

## 2016-10-17 DIAGNOSIS — R739 Hyperglycemia, unspecified: Secondary | ICD-10-CM

## 2016-10-17 DIAGNOSIS — I10 Essential (primary) hypertension: Secondary | ICD-10-CM

## 2016-10-17 DIAGNOSIS — R404 Transient alteration of awareness: Secondary | ICD-10-CM

## 2016-10-17 LAB — CBC
HCT: 43.2 % (ref 39.0–52.0)
Hemoglobin: 14.2 g/dL (ref 13.0–17.0)
MCH: 28.7 pg (ref 26.0–34.0)
MCHC: 32.9 g/dL (ref 30.0–36.0)
MCV: 87.4 fL (ref 78.0–100.0)
PLATELETS: 184 10*3/uL (ref 150–400)
RBC: 4.94 MIL/uL (ref 4.22–5.81)
RDW: 13.8 % (ref 11.5–15.5)
WBC: 5.9 10*3/uL (ref 4.0–10.5)

## 2016-10-17 LAB — BASIC METABOLIC PANEL
ANION GAP: 5 (ref 5–15)
BUN: 11 mg/dL (ref 6–20)
CO2: 29 mmol/L (ref 22–32)
CREATININE: 0.89 mg/dL (ref 0.61–1.24)
Calcium: 8 mg/dL — ABNORMAL LOW (ref 8.9–10.3)
Chloride: 102 mmol/L (ref 101–111)
Glucose, Bld: 89 mg/dL (ref 65–99)
Potassium: 3.7 mmol/L (ref 3.5–5.1)
Sodium: 136 mmol/L (ref 135–145)

## 2016-10-17 MED ORDER — LEVETIRACETAM 500 MG PO TABS: 500.0000 mg | ORAL_TABLET | Freq: Two times a day (BID) | ORAL | 1 refills | Status: DC

## 2016-10-17 MED ORDER — THIAMINE HCL 100 MG/ML IJ SOLN
500.0000 mg | Freq: Three times a day (TID) | INTRAVENOUS | Status: DC
  Filled 2016-10-17 (×4): qty 5

## 2016-10-17 MED ORDER — VITAMIN B-1 100 MG PO TABS: 100.0000 mg | ORAL_TABLET | Freq: Every day | ORAL | Status: DC

## 2016-10-17 MED ORDER — THIAMINE HCL 100 MG PO TABS
100.0000 mg | ORAL_TABLET | Freq: Every day | ORAL | 1 refills | Status: DC
Start: 2016-10-17 — End: 2016-11-30

## 2016-10-17 NOTE — Progress Notes (Signed)
Patient discharged to home.  Escorted patient downstairs to lobby.  Patient had friend waiting in lobby to take him home.  Discharge instructions along with medication instructions presented and patient understood the importance of taking medications starting today.

## 2016-10-17 NOTE — Clinical Social Work Note (Signed)
LCSW spoke with patient. Patient remains somewhat confused. Patient stated that he will be able to drive again in 2-3 days. LCSW and CM discussed that patient may not be able to drive that soon and typically it's around six months before a person can drive after having a seizure. Patient provided permission for LCSW to attempt to locate his daughters to address via Internet as he did not have their contact information in regards to providing some supervision when patient is discharged.     Kiki Bivens, Clydene Pugh, LCSW

## 2016-10-17 NOTE — Consult Note (Signed)
[place] A. Merlene Laughter, MD     [email or web address]          Joshua Mckee is an 76 y.o. male.   ASSESSMENT/PLAN: Resolved complex partial status epilepticus. He is on maintenance dose of Keppra 500 mg twice a day. No driving or operating machinery discussed with him.   FU 4 weeks in office.    Much improved. No recurrent events  GENERAL: He is breathing well and is in no acute distress although confused.  HEENT: Supple. Atraumatic normocephalic.   ABDOMEN: soft  EXTREMITIES: No edema. There is marked arthritic changes especially at the knees bilaterally.   BACK: Normal.  SKIN: Normal by inspection.    MENTAL STATUS: He is awake and alert. Lucid; wants to go home. Initially says he is in [place] at [place] but re -oriented - says he use to live in [place]: Pupils are equal, round and reactive to light and accommodation; extra ocular movements are full, there is no significant nystagmus; visual fields are full; upper and lower facial muscles are normal in strength and symmetric, there is no flattening of the nasolabial folds; tongue is midline; uvula is midline; shoulder elevation is normal.  MOTOR: Normal tone, bulk and strength; no pronator drift.  COORDINATION: Left finger to nose is normal, right finger to nose is normal, No rest tremor; no intention tremor; no postural tremor; no bradykinesia.  SENSATION: Normal to light touch.    EEG This recording is abnormal showing multiple episodes of epileptiform activity involving the left temporal region. However, there is no evidence of ongoing electrographic seizures.     Blood pressure 136/83, pulse (!) 57, temperature 97.6 F (36.4 C), temperature source Oral, resp. rate 20, height '5\' 5"'  (1.651 m), weight 155 lb 6.4 oz (70.5 kg), SpO2 100 %.  Past Medical History:  Diagnosis Date  . Erosive esophagitis   . Hiccups     Past Surgical History:  Procedure Laterality Date  . COLON  SURGERY    . COLONOSCOPY N/A 03/08/2016   Procedure: COLONOSCOPY;  Surgeon: Daneil Dolin, MD;  [facility];  Service: Endoscopy;  Laterality: N/A;  1300-moved to 1400  . ESOPHAGOGASTRODUODENOSCOPY N/A 10/05/2015   Dr. Gala Romney: erosive reflux esophagitis, non-critcal Schatki's ring non-manipulated  . ESOPHAGOGASTRODUODENOSCOPY N/A 12/15/2015   Dr. Rourk:non bleeding esophageal ulcer/small HH  . ESOPHAGOGASTRODUODENOSCOPY N/A 03/08/2016   Procedure: ESOPHAGOGASTRODUODENOSCOPY (EGD);  Surgeon: Daneil Dolin, MD;  Location: [facility];  Service: Endoscopy;  Laterality: N/A;  . exploratory laparotomy     Dr. Tamala Julian 2006, SBO secondary to adhesions     Family History  Problem Relation Age of Onset  . Heart disease Brother   . Colon cancer Neg Hx     Social History:  reports that he has never smoked. He has never used smokeless tobacco. He reports that he drinks about 1.8 oz of alcohol per week . He reports that he does not use drugs.  Allergies: No Known Allergies  Medications: Prior to Admission medications   Medication Sig Start Date End Date Taking? Authorizing Provider  aspirin EC 81 MG EC tablet Take 1 tablet (81 mg total) by mouth daily. 08/30/16  Yes Erline Hau, MD  atorvastatin (LIPITOR) 40 MG tablet Take 1 tablet (40 mg total) by mouth daily at 6 PM. 08/29/16  Yes Estela Leonie Green, MD  pantoprazole (PROTONIX) 40 MG tablet Take 1 tablet (40 mg total) by mouth daily. 10/11/16   Randall Hiss  A Gill, NP    Scheduled Meds: . aspirin EC  81 mg Oral Daily  . atorvastatin  40 mg Oral q1800  . enoxaparin (LOVENOX) injection  40 mg Subcutaneous Q24H  . levETIRAcetam  500 mg Oral BID  . pantoprazole  40 mg Oral Daily   Continuous Infusions: . sodium chloride 75 mL/hr (10/15/16 2121)   PRN Meds:.acetaminophen **OR** acetaminophen, LORazepam, ondansetron **OR** ondansetron (ZOFRAN) IV     Results for orders placed or performed during the hospital  encounter of 10/15/16 (from the past 48 hour(s))  Comprehensive metabolic panel     Status: Abnormal   Collection Time: 10/15/16 11:20 AM  Result Value Ref Range   Sodium 135 135 - 145 mmol/L   Potassium 3.9 3.5 - 5.1 mmol/L   Chloride 99 (L) 101 - 111 mmol/L   CO2 31 22 - 32 mmol/L   Glucose, Bld 133 (H) 65 - 99 mg/dL   BUN 11 6 - 20 mg/dL   Creatinine, Ser 0.90 0.61 - 1.24 mg/dL   Calcium 8.6 (L) 8.9 - 10.3 mg/dL   Total Protein 7.0 6.5 - 8.1 g/dL   Albumin 3.7 3.5 - 5.0 g/dL   AST 27 15 - 41 U/L   ALT 19 17 - 63 U/L   Alkaline Phosphatase 72 38 - 126 U/L   Total Bilirubin 0.8 0.3 - 1.2 mg/dL   GFR calc non Af Amer >60 >60 mL/min   GFR calc Af Amer >60 >60 mL/min    Comment: (NOTE) The eGFR has been calculated using the CKD EPI equation. This calculation has not been validated in all clinical situations. eGFR's persistently <60 mL/min signify possible Chronic Kidney Disease.    Anion gap 5 5 - 15  CBC     Status: None   Collection Time: 10/15/16 11:20 AM  Result Value Ref Range   WBC 6.1 4.0 - 10.5 K/uL   RBC 4.80 4.22 - 5.81 MIL/uL   Hemoglobin 14.2 13.0 - 17.0 g/dL   HCT 42.1 39.0 - 52.0 %   MCV 87.7 78.0 - 100.0 fL   MCH 29.6 26.0 - 34.0 pg   MCHC 33.7 30.0 - 36.0 g/dL   RDW 13.5 11.5 - 15.5 %   Platelets 201 150 - 400 K/uL  Gamma GT     Status: None   Collection Time: 10/15/16 12:00 PM  Result Value Ref Range   GGT 21 7 - 50 U/L    Comment: Performed at [facility], [place] [address], [place], [place] [address]  Urinalysis, Routine w reflex microscopic     Status: Abnormal   Collection Time: 10/15/16  1:36 PM  Result Value Ref Range   Color, Urine STRAW (A) YELLOW   APPearance CLEAR CLEAR   Specific Gravity, Urine 1.008 1.005 - 1.030   pH 7.0 5.0 - 8.0   Glucose, UA NEGATIVE NEGATIVE mg/dL   Hgb urine dipstick NEGATIVE NEGATIVE   Bilirubin Urine NEGATIVE NEGATIVE   Ketones, ur NEGATIVE NEGATIVE mg/dL   Protein, ur NEGATIVE NEGATIVE mg/dL   Nitrite  NEGATIVE NEGATIVE   Leukocytes, UA NEGATIVE NEGATIVE  Rapid urine drug screen (hospital performed)     Status: None   Collection Time: 10/15/16  1:36 PM  Result Value Ref Range   Opiates NONE DETECTED NONE DETECTED   Cocaine NONE DETECTED NONE DETECTED   Benzodiazepines NONE DETECTED NONE DETECTED   Amphetamines NONE DETECTED NONE DETECTED   Tetrahydrocannabinol NONE DETECTED NONE DETECTED   Barbiturates  NONE DETECTED NONE DETECTED    Comment:        DRUG SCREEN FOR MEDICAL PURPOSES ONLY.  IF CONFIRMATION IS NEEDED FOR ANY PURPOSE, NOTIFY LAB WITHIN 5 DAYS.        LOWEST DETECTABLE LIMITS FOR URINE DRUG SCREEN Drug Class       Cutoff (ng/mL) Amphetamine      1000 Barbiturate      200 Benzodiazepine   818 Tricyclics       563 Opiates          300 Cocaine          300 THC              50   POC CBG, ED     Status: None   Collection Time: 10/15/16  5:11 PM  Result Value Ref Range   Glucose-Capillary 90 65 - 99 mg/dL  Basic metabolic panel     Status: Abnormal   Collection Time: 10/17/16  5:50 AM  Result Value Ref Range   Sodium 136 135 - 145 mmol/L   Potassium 3.7 3.5 - 5.1 mmol/L   Chloride 102 101 - 111 mmol/L   CO2 29 22 - 32 mmol/L   Glucose, Bld 89 65 - 99 mg/dL   BUN 11 6 - 20 mg/dL   Creatinine, Ser 0.89 0.61 - 1.24 mg/dL   Calcium 8.0 (L) 8.9 - 10.3 mg/dL   GFR calc non Af Amer >60 >60 mL/min   GFR calc Af Amer >60 >60 mL/min    Comment: (NOTE) The eGFR has been calculated using the CKD EPI equation. This calculation has not been validated in all clinical situations. eGFR's persistently <60 mL/min signify possible Chronic Kidney Disease.    Anion gap 5 5 - 15  CBC     Status: None   Collection Time: 10/17/16  5:50 AM  Result Value Ref Range   WBC 5.9 4.0 - 10.5 K/uL   RBC 4.94 4.22 - 5.81 MIL/uL   Hemoglobin 14.2 13.0 - 17.0 g/dL   HCT 43.2 39.0 - 52.0 %   MCV 87.4 78.0 - 100.0 fL   MCH 28.7 26.0 - 34.0 pg   MCHC 32.9 30.0 - 36.0 g/dL   RDW 13.8 11.5  - 15.5 %   Platelets 184 150 - 400 K/uL    Studies/Results:  BRAIN MRI FINDINGS: Brain: No acute infarction, hemorrhage, hydrocephalus, extra-axial collection or mass lesion.  Late subacute lacunar infarct in the right thalamus. Residual DWI hyperintensity at this level is primarily attributed to shine through. The area of signal abnormality has diminished from decreasing edema.  Chronic ventriculomegaly without notable temporal horn ballooning or increase in callosal angle, favor ex vacuo ventricular enlargement.  Vascular: Loss of normal flow void and in the left vertebral artery just before entering the dura. The intradural segment has normal appearance, arguing against complete occlusion at the V3 segment.  Skull and upper cervical spine: Negative  Sinuses/Orbits: Negative  IMPRESSION: 1. No acute finding, including infarct. 2. Short segment of lost flow void in the left V3 segment favoring artifact or slow flow. The intracranial segment has a normalized appearance. 3. Stable ventriculomegaly as described.           BRAIN MRA 08/2016: FINDINGS: Antegrade flow in the posterior circulation with codominant distal vertebral arteries. No distal vertebral stenosis, artifactual signal loss in the V4 segments. Normal PICA origin on the right. Patent vertebrobasilar junction. Mild basilar artery irregularity without stenosis. Normal SCA and  PCA origins. Posterior communicating arteries are diminutive or absent. Artifactual signal loss in both PCA P2 segments. Bilateral PCA branches are felt to be within normal limits.  Antegrade flow in both ICA siphons. Mild siphon irregularity without stenosis. Normal ophthalmic artery origins. Normal carotid termini. Normal MCA and ACA origins mildly to moderately tortuous bilateral ACA is. Anterior communicating artery diminutive or absent. Bilateral MCA M1 segments, MCA bifurcations, and visualized bilateral MCA  branches are within normal limits.  IMPRESSION: Normal for age intracranial MRA.      CAROTID DOPPLERS RIGHT  ICA:  48/19 cm/sec  CCA:  076/22 cm/sec  SYSTOLIC ICA/CCA RATIO:  0.8  DIASTOLIC ICA/CCA RATIO:  1.5  ECA:  57 cm/sec  LEFT  ICA:  46/21 cm/sec  CCA:  63/33 cm/sec  SYSTOLIC ICA/CCA RATIO:  0.9  DIASTOLIC ICA/CCA RATIO:  1.7  ECA:  39 cm/sec  RIGHT CAROTID ARTERY: Normal sonographic appearance of the right carotid system. There are no elevated peak systolic velocities within the interrogated course of the right internal carotid artery to suggest a hemodynamically significant stenosis.  RIGHT VERTEBRAL ARTERY:  Antegrade Flow  LEFT CAROTID ARTERY: Normal sonographic appearance of the left carotid system. There are no elevated peak systolic velocities within the interrogated course of the left internal carotid artery to suggest a hemodynamically significant stenosis.  LEFT VERTEBRAL ARTERY:  Antegrade Flow  IMPRESSION: Normal carotid Doppler ultrasound.         Joshua Mckee A. Merlene Laughter, M.D.  Diplomate, Tax adviser of Psychiatry and Neurology ( Neurology). 10/17/2016, 8:12 AM

## 2016-10-17 NOTE — Procedures (Signed)
  [place] A. Merlene Laughter, MD     [email or web address]           HISTORY: The patient is 76 year old man who presents with multiple episodes of confusion and altered mental status. Clinically appears to have complex partial seizures.  MEDICATIONS: Scheduled Meds: . aspirin EC  81 mg Oral Daily  . atorvastatin  40 mg Oral q1800  . enoxaparin (LOVENOX) injection  40 mg Subcutaneous Q24H  . levETIRAcetam  500 mg Oral BID  . pantoprazole  40 mg Oral Daily   Continuous Infusions: . sodium chloride 75 mL/hr (10/15/16 2121)   PRN Meds:.acetaminophen **OR** acetaminophen, LORazepam, ondansetron **OR** ondansetron (ZOFRAN) IV  Prior to Admission medications   Medication Sig Start Date End Date Taking? Authorizing Provider  aspirin EC 81 MG EC tablet Take 1 tablet (81 mg total) by mouth daily. 08/30/16  Yes Erline Hau, MD  atorvastatin (LIPITOR) 40 MG tablet Take 1 tablet (40 mg total) by mouth daily at 6 PM. 08/29/16  Yes Estela Leonie Green, MD  pantoprazole (PROTONIX) 40 MG tablet Take 1 tablet (40 mg total) by mouth daily. 10/11/16   Carlis Stable, NP      ANALYSIS: A 16 channel recording using standard 10 20 measurements is conducted for 21 minutes. There is a well-formed posterior dominant rhythm of 9 Hz which attenuates with eye opening. There is beta activity observing the frontal areas. Awake and sleep activities are observed. K complexes and sleep spindles are observed. Photic stimulation and hypoventilation are not conducted. There is no focal or lateral slowing. There are multiple episodes of sharp wave activity and spike slow-wave activity that phase reverses at T3. No clear evidence however of ongoing electrographic seizures.   IMPRESSION: 1. This recording is abnormal showing multiple episodes of epileptiform activity involving the left temporal region. However, there is no evidence of ongoing electrographic seizures.      Keydi Giel A.  Merlene Laughter, M.D.  Diplomate, Tax adviser of Psychiatry and Neurology ( Neurology).

## 2016-10-17 NOTE — Progress Notes (Signed)
Upon entering the patient's room, patient was unable to speak any words, was able to visually accept that we were in the room, but could not express himself.  Code stroke was called at [phone].  Dr. Candiss Norse in room to examine the patient at [phone].  Dr. Merlene Laughter in route to hospital, called to request that we do not take patient to CT scan at [phone].  Dr. Jerilee Hoh in to assess the patient at 0806, at which time neuro examination changed again, patient began to be able to talk.  It was felt that patient was having seizure as opposed to stroke.  Code stroke cancelled and CT cancelled at [phone] along with Dr. Merlene Laughter at bedside to examine the patient.  Patient started on Keppra and felt to be having seizure.

## 2016-10-17 NOTE — Care Management Note (Signed)
Case Management Note  Patient Details  Name: Joshua Mckee MRN: [id number] Date of Birth: 1941-04-30  Subjective/Objective:                  Pt admitted with new onset seizures. He is from home, lives with gf at baseline but gf is currently undergoing a prolonged hospitalization. Pt reports not needing assistive devices with ambulation. He reports having his gf's daughter and neighbor for support but could not give names or number contact info for either. He has multiple children, two of which live in [place] but can not contact at this time. CSW has made attempt to contact them also. Pt is alert to self and situation. despite multiple attempts at eduction pt still insists he will be able to drive in 2-3 days. Due to not being able to contact pt's family or friends to ensure pt's safety and supervision after DC, MD planning to hold pt discharged and complete TTS eval. Pt unfortunatly discharged prior to DC being cancelled. Pt does not have PCP. PCP established with Dr. Jeneen Rinks Kim's office, appointment time placed on pt's AVS. Pt referred out for [facility] nursing and SW services through [facility]. Romualdo Bolk, of [facility], made aware of referral and will obtain pt info from chart. PT also referred to [facility]. CSW will also make APS referral for safety.   Action/Plan: Pt discharged home with [facility] nursing, SW and referred to the integrated health care program.   Expected Discharge Date:  10/17/16               Expected Discharge Plan:  [place]  In-House Referral:  Clinical Social Work  Discharge planning Services  CM Consult  Post Acute Care Choice:  NA Choice offered to:  NA  HH Arranged:  Therapist, sports, Social Work, Nurse's Aide [place] Agency:  [employer]  Status of Service:  Completed, signed off Sherald Barge, RN 10/17/2016, 2:14 PM

## 2016-10-17 NOTE — Discharge Summary (Signed)
Physician Discharge Summary  Joshua Mckee [id number] DOB: October 08, 1940 DOA: 10/15/2016  PCP: No PCP Per Patient  Admit date: 10/15/2016 Discharge date: 10/17/2016  Admitted From: Home Disposition:  Home   Recommendations for Outpatient Follow-up:  1. Follow up with PCP in 1-2 weeks 2. Please obtain BMP/CBC in one week   Home Health: No Equipment/Devices:none  Discharge Condition: Stable CODE STATUS: FULL Diet recommendation: Heart Healthy   Brief/Interim Summary: 76 year old man admitted to the hospital on [id number] after the police found him confused driving slowly in the middle of the road. He was brought to the hospital, MRI was negative and was discharged home. While leaving the emergency department he was found to have a seizure and admission was requested for observation. At around 7 AM on 1/30 a code stroke was called on the patient due to acute change in mental status. By the time Dr. Dana Allan assessed the patient his mental status and neurologic changes were already resolving. Dr. Merlene Laughter was at bedside and canceled CT scan and believed this is most likely due to partial seizures and has been started on Keppra. BMP and CBC were unremarkable. MRI of the brain was negative for acute findings except for stable ventriculomegaly. EEG was obtained and showed epileptiform discharges in the left temporal lobe region. The patient's mental status remained stable for the remainder of the hospitalization. The patient was cleared for discharge by neurology with follow-up in the office in one month.  Discharge Diagnoses:  Complex partial seizures - EEG showed epileptiform discharges in the left temporal lobe region -Started on Keppra by neurology -pt informed he cannot drive until cleared by neurology--he expressed understanding  Altered mental status -Likely due to seizures -resolved  Hyperlipidemia -continue statin  Hx stroke -continue ASA   Discharge  Instructions   Allergies as of 10/17/2016   No Known Allergies     Medication List    TAKE these medications   aspirin 81 MG EC tablet Take 1 tablet (81 mg total) by mouth daily.   atorvastatin 40 MG tablet Commonly known as:  LIPITOR Take 1 tablet (40 mg total) by mouth daily at 6 PM.   levETIRAcetam 500 MG tablet Commonly known as:  KEPPRA Take 1 tablet (500 mg total) by mouth 2 (two) times daily.   pantoprazole 40 MG tablet Commonly known as:  PROTONIX Take 1 tablet (40 mg total) by mouth daily.   thiamine 100 MG tablet Take 1 tablet (100 mg total) by mouth daily.      Follow-up Information    [facility]. Call in 2 days.   Contact information: [place] [address] [phone]          No Known Allergies  Consultations:  Neurology   Procedures/Studies: Mr Brain Wo Contrast  Result Date: 10/15/2016 CLINICAL DATA:  Weakness and confusion for 1 day. Transient altered mental status. EXAM: MRI HEAD WITHOUT CONTRAST TECHNIQUE: Multiplanar, multiecho pulse sequences of the brain and surrounding structures were obtained without intravenous contrast. COMPARISON:  08/28/2016 FINDINGS: Brain: No acute infarction, hemorrhage, hydrocephalus, extra-axial collection or mass lesion. Late subacute lacunar infarct in the right thalamus. Residual DWI hyperintensity at this level is primarily attributed to shine through. The area of signal abnormality has diminished from decreasing edema. Chronic ventriculomegaly without notable temporal horn ballooning or increase in callosal angle, favor ex vacuo ventricular enlargement. Vascular: Loss of normal flow void and in the left vertebral artery just before entering the dura. The intradural segment  has normal appearance, arguing against complete occlusion at the V3 segment. Skull and upper cervical spine: Negative Sinuses/Orbits: Negative IMPRESSION: 1. No acute finding, including infarct. 2. Short  segment of lost flow void in the left V3 segment favoring artifact or slow flow. The intracranial segment has a normalized appearance. 3. Stable ventriculomegaly as described. Electronically Signed   By: Monte Fantasia M.D.   On: 10/15/2016 14:43        Discharge Exam: Vitals:   10/16/16 2200 10/17/16 0619  BP: 110/84 136/83  Pulse: (!) 54 (!) 57  Resp: 20 20  Temp: 97.4 F (36.3 C) 97.6 F (36.4 C)   Vitals:   10/16/16 0622 10/16/16 1407 10/16/16 2200 10/17/16 0619  BP: 116/72 114/80 110/84 136/83  Pulse: (!) 57 84 (!) 54 (!) 57  Resp: 20 20 20 20   Temp: 98.7 F (37.1 C) 98.6 F (37 C) 97.4 F (36.3 C) 97.6 F (36.4 C)  TempSrc: Other (Comment) Oral Oral Oral  SpO2: 99% 99% 100% 100%  Weight:      Height:        General: Pt is alert, awake, not in acute distress Cardiovascular: RRR, S1/S2 +, no rubs, no gallops Respiratory: CTA bilaterally, no wheezing, no rhonchi Abdominal: Soft, NT, ND, bowel sounds + Extremities: no edema, no cyanosis   The results of significant diagnostics from this hospitalization (including imaging, microbiology, ancillary and laboratory) are listed below for reference.    Significant Diagnostic Studies: Mr Brain Wo Contrast  Result Date: 10/15/2016 CLINICAL DATA:  Weakness and confusion for 1 day. Transient altered mental status. EXAM: MRI HEAD WITHOUT CONTRAST TECHNIQUE: Multiplanar, multiecho pulse sequences of the brain and surrounding structures were obtained without intravenous contrast. COMPARISON:  08/28/2016 FINDINGS: Brain: No acute infarction, hemorrhage, hydrocephalus, extra-axial collection or mass lesion. Late subacute lacunar infarct in the right thalamus. Residual DWI hyperintensity at this level is primarily attributed to shine through. The area of signal abnormality has diminished from decreasing edema. Chronic ventriculomegaly without notable temporal horn ballooning or increase in callosal angle, favor ex vacuo ventricular  enlargement. Vascular: Loss of normal flow void and in the left vertebral artery just before entering the dura. The intradural segment has normal appearance, arguing against complete occlusion at the V3 segment. Skull and upper cervical spine: Negative Sinuses/Orbits: Negative IMPRESSION: 1. No acute finding, including infarct. 2. Short segment of lost flow void in the left V3 segment favoring artifact or slow flow. The intracranial segment has a normalized appearance. 3. Stable ventriculomegaly as described. Electronically Signed   By: Monte Fantasia M.D.   On: 10/15/2016 14:43     Microbiology: No results found for this or any previous visit (from the past 240 hour(s)).   Labs: Basic Metabolic Panel:  Recent Labs Lab 10/15/16 1120 10/17/16 0550  NA 135 136  K 3.9 3.7  CL 99* 102  CO2 31 29  GLUCOSE 133* 89  BUN 11 11  CREATININE 0.90 0.89  CALCIUM 8.6* 8.0*   Liver Function Tests:  Recent Labs Lab 10/15/16 1120  AST 27  ALT 19  ALKPHOS 72  BILITOT 0.8  PROT 7.0  ALBUMIN 3.7   No results for input(s): LIPASE, AMYLASE in the last 168 hours. No results for input(s): AMMONIA in the last 168 hours. CBC:  Recent Labs Lab 10/15/16 1120 10/17/16 0550  WBC 6.1 5.9  HGB 14.2 14.2  HCT 42.1 43.2  MCV 87.7 87.4  PLT 201 184   Cardiac Enzymes: No results for  input(s): CKTOTAL, CKMB, CKMBINDEX, TROPONINI in the last 168 hours. BNP: Invalid input(s): POCBNP CBG:  Recent Labs Lab 10/15/16 1711  GLUCAP 90    Time coordinating discharge:  Greater than 30 minutes  Signed:  Zoriah Pulice, DO [facility] Pager: [phone] 10/17/2016, 11:09 AM

## 2016-10-17 NOTE — Progress Notes (Addendum)
PROGRESS NOTE  Joshua Mckee [id number] DOB: 05/11/1941 DOA: 10/15/2016 PCP: No PCP Per Patient  Brief History:  76 year old man admitted to the hospital on [id number] after the police found him confused driving slowly in the middle of the road. He was brought to the hospital, MRI was negative and was discharged home. While leaving the emergency department he was found to have a seizure and admission was requested for observation. At around 7 AM on 1/30 a code stroke was called on the patient due to acute change in mental status. By the time Dr. Dana Allan assessed the patient his mental status and neurologic changes were already resolving. Dr. Merlene Laughter was at bedside and canceled CT scan and believedthis is most likely due to partial seizures and has been started on Keppra. BMP and CBC were unremarkable. MRI of the brain was negative for acute findings except for stable ventriculomegaly. EEG was obtained and showed epileptiform discharges in the left temporal lobe region. The patient's mental status remained stable for the remainder of the hospitalization. The patient was cleared for discharge by neurology with follow-up in the office in one month. However, the patient appeared to have some degree of chronic encephalopathy and there was concerns of the patient going home alone from Education officer, museum standpoint. As a result, APS will be contacted by social work to make a home visit within a few days of discharge  Assessment/Plan: Complex partial seizures - EEG showed epileptiform discharges in the left temporal lobe region -Started on Keppra by neurology -pt informed he cannot drive until cleared by neurology--he expressed understanding  Altered mental status -Likely due to seizures -resolved  Hyperlipidemia -continue statin  Hx stroke -continue ASA  Etoh abuse history/Encephalopathy -suspect underlying Wernicke's  -thiamine 500mg  IV q8 x 6 doses -B12 -TSH -Free  T4 -HIV -RPR      Family Communication:   Family at bedside--Total time spent 35 minutes.  Greater than 50% spent face to face counseling and coordinating care.   Consultants:  neurology  Code Status:  FULL   DVT Prophylaxis:  [place] Lovenox   Procedures: As Listed in Progress Note Above  Antibiotics: None    Subjective: Patient denies fevers, chills, headache, chest pain, dyspnea, nausea, vomiting, diarrhea, abdominal pain, dysuria, hematuria, hematochezia, and melena.   Objective: Vitals:   10/16/16 0622 10/16/16 1407 10/16/16 2200 10/17/16 0619  BP: 116/72 114/80 110/84 136/83  Pulse: (!) 57 84 (!) 54 (!) 57  Resp: 20 20 20 20   Temp: 98.7 F (37.1 C) 98.6 F (37 C) 97.4 F (36.3 C) 97.6 F (36.4 C)  TempSrc: Other (Comment) Oral Oral Oral  SpO2: 99% 99% 100% 100%  Weight:      Height:        Intake/Output Summary (Last 24 hours) at 10/17/16 1148 Last data filed at 10/17/16 0800  Gross per 24 hour  Intake           2547.5 ml  Output              850 ml  Net           1697.5 ml   Weight change:  Exam:   General:  Pt is alert, follows commands appropriately, not in acute distress  HEENT: No icterus, No thrush, No neck mass, [place]/AT  Cardiovascular: RRR, S1/S2, no rubs, no gallops  Respiratory: CTA bilaterally, no wheezing, no crackles, no rhonchi  Abdomen: Soft/+BS, non tender, non distended, no  guarding  Extremities: No edema, No lymphangitis, No petechiae, No rashes, no synovitis   Data Reviewed: I have personally reviewed following labs and imaging studies Basic Metabolic Panel:  Recent Labs Lab 10/15/16 1120 10/17/16 0550  NA 135 136  K 3.9 3.7  CL 99* 102  CO2 31 29  GLUCOSE 133* 89  BUN 11 11  CREATININE 0.90 0.89  CALCIUM 8.6* 8.0*   Liver Function Tests:  Recent Labs Lab 10/15/16 1120  AST 27  ALT 19  ALKPHOS 72  BILITOT 0.8  PROT 7.0  ALBUMIN 3.7   No results for input(s): LIPASE, AMYLASE in the last 168 hours. No  results for input(s): AMMONIA in the last 168 hours. Coagulation Profile: No results for input(s): INR, PROTIME in the last 168 hours. CBC:  Recent Labs Lab 10/15/16 1120 10/17/16 0550  WBC 6.1 5.9  HGB 14.2 14.2  HCT 42.1 43.2  MCV 87.7 87.4  PLT 201 184   Cardiac Enzymes: No results for input(s): CKTOTAL, CKMB, CKMBINDEX, TROPONINI in the last 168 hours. BNP: Invalid input(s): POCBNP CBG:  Recent Labs Lab 10/15/16 1711  GLUCAP 90   HbA1C: No results for input(s): HGBA1C in the last 72 hours. Urine analysis:    Component Value Date/Time   COLORURINE STRAW (A) 10/15/2016 1336   APPEARANCEUR CLEAR 10/15/2016 1336   LABSPEC 1.008 10/15/2016 1336   PHURINE 7.0 10/15/2016 1336   GLUCOSEU NEGATIVE 10/15/2016 1336   HGBUR NEGATIVE 10/15/2016 [place] 10/15/2016 1336   KETONESUR NEGATIVE 10/15/2016 1336   PROTEINUR NEGATIVE 10/15/2016 1336   UROBILINOGEN 0.2 08/23/2012 1615   NITRITE NEGATIVE 10/15/2016 1336   LEUKOCYTESUR NEGATIVE 10/15/2016 1336   Sepsis Labs: @LABRCNTIP (procalcitonin:4,lacticidven:4) )No results found for this or any previous visit (from the past 240 hour(s)).   Scheduled Meds: . aspirin EC  81 mg Oral Daily  . atorvastatin  40 mg Oral q1800  . enoxaparin (LOVENOX) injection  40 mg Subcutaneous Q24H  . levETIRAcetam  500 mg Oral BID  . pantoprazole  40 mg Oral Daily  . thiamine injection  500 mg Intravenous Q8H   Continuous Infusions: . sodium chloride 75 mL/hr (10/15/16 2121)    Procedures/Studies: Mr Brain Wo Contrast  Result Date: 10/15/2016 CLINICAL DATA:  Weakness and confusion for 1 day. Transient altered mental status. EXAM: MRI HEAD WITHOUT CONTRAST TECHNIQUE: Multiplanar, multiecho pulse sequences of the brain and surrounding structures were obtained without intravenous contrast. COMPARISON:  08/28/2016 FINDINGS: Brain: No acute infarction, hemorrhage, hydrocephalus, extra-axial collection or mass lesion. Late  subacute lacunar infarct in the right thalamus. Residual DWI hyperintensity at this level is primarily attributed to shine through. The area of signal abnormality has diminished from decreasing edema. Chronic ventriculomegaly without notable temporal horn ballooning or increase in callosal angle, favor ex vacuo ventricular enlargement. Vascular: Loss of normal flow void and in the left vertebral artery just before entering the dura. The intradural segment has normal appearance, arguing against complete occlusion at the V3 segment. Skull and upper cervical spine: Negative Sinuses/Orbits: Negative IMPRESSION: 1. No acute finding, including infarct. 2. Short segment of lost flow void in the left V3 segment favoring artifact or slow flow. The intracranial segment has a normalized appearance. 3. Stable ventriculomegaly as described. Electronically Signed   By: Monte Fantasia M.D.   On: 10/15/2016 14:43    Chauna Osoria, DO  [facility] Pager [phone]  If 7PM-7AM, please contact night-coverage [email or web address] Password [email or web address] 10/17/2016, 11:48 AM   LOS: 1 day

## 2016-10-17 NOTE — Care Management Important Message (Signed)
Important Message  Patient Details  Name: Joshua Mckee MRN: [id number] Date of Birth: [birth year]   Medicare Important Message Given:  Yes    Sherald Barge, RN 10/17/2016, 2:32 PM

## 2016-10-18 NOTE — Clinical Social Work Note (Signed)
CSW made APS report with Carlis Stable at [facility].    Kennedee Kitzmiller, Clydene Pugh, LCSW

## 2016-11-22 ENCOUNTER — Ambulatory Visit: Payer: Medicare Other | Admitting: Family Medicine

## 2016-11-30 ENCOUNTER — Ambulatory Visit (INDEPENDENT_AMBULATORY_CARE_PROVIDER_SITE_OTHER): Payer: Medicare Other | Admitting: Family Medicine

## 2016-11-30 ENCOUNTER — Encounter: Payer: Self-pay | Admitting: Family Medicine

## 2016-11-30 VITALS — BP 120/88 | HR 84 | Temp 98.6°F | Resp 18 | Ht 65.0 in | Wt 155.1 lb

## 2016-11-30 DIAGNOSIS — I10 Essential (primary) hypertension: Secondary | ICD-10-CM

## 2016-11-30 DIAGNOSIS — G3184 Mild cognitive impairment, so stated: Secondary | ICD-10-CM

## 2016-11-30 DIAGNOSIS — E785 Hyperlipidemia, unspecified: Secondary | ICD-10-CM

## 2016-11-30 DIAGNOSIS — Z8673 Personal history of transient ischemic attack (TIA), and cerebral infarction without residual deficits: Secondary | ICD-10-CM | POA: Diagnosis not present

## 2016-11-30 DIAGNOSIS — K2211 Ulcer of esophagus with bleeding: Secondary | ICD-10-CM

## 2016-11-30 MED ORDER — ATORVASTATIN CALCIUM 40 MG PO TABS: 40.0000 mg | ORAL_TABLET | Freq: Every day | ORAL | 1 refills | Status: DC

## 2016-11-30 MED ORDER — LEVETIRACETAM 500 MG PO TABS: 500.0000 mg | ORAL_TABLET | Freq: Two times a day (BID) | ORAL | 1 refills | Status: DC

## 2016-11-30 MED ORDER — PANTOPRAZOLE SODIUM 40 MG PO TBEC: 40.0000 mg | DELAYED_RELEASE_TABLET | Freq: Every day | ORAL | 3 refills | Status: DC

## 2016-11-30 MED ORDER — ASPIRIN EC 81 MG PO TBEC: 81.0000 mg | DELAYED_RELEASE_TABLET | Freq: Every day | ORAL | 3 refills | Status: DC

## 2016-11-30 NOTE — Progress Notes (Signed)
Chief Complaint  Patient presents with  . Establish Care   New to establish No old records - states he does not have a PCP Here with social services rep for adult protective services.  There is some question about ability to care for self.  He lives with a GF who is described as "sharp", and her daughter comes in daily to care for them. He was in hospital in Jan for a stroke - seizure - altered awareness.  Has an abnormal EEG.  Knows he cannot drive until 6 months seizure free. Is on lipitor and keppra.  Is unable to tell me how to take correctly.  Is advised to get a pill box to separate am from pm meds to take BID. Is NOT on protonix, cannot tell me why.  Is unaware he should be on a PPI.  Has had more than one incidence of GI bleed.  This is refilled. He got a flu shot and a prevnar in the hospital. Colonoscopy last year. Quit drinking last year. Good appetite, no falls.   Patient Active Problem List   Diagnosis Date Noted  . Mild cognitive impairment with memory loss 11/30/2016  . Hyperlipidemia 11/30/2016  . Seizure ([place]) 10/15/2016  . Hyperglycemia 10/15/2016  . Acute ischemic stroke ([place]) 08/27/2016  . Hypertension 08/27/2016  . Esophageal ulcer 02/27/2016  . GI bleed 12/15/2015  . ETOH abuse 12/14/2015  . Reflux esophagitis   . Hiatal hernia     Outpatient Encounter Prescriptions as of 11/30/2016  Medication Sig  . aspirin EC 81 MG EC tablet Take 1 tablet (81 mg total) by mouth daily.  Marland Kitchen atorvastatin (LIPITOR) 40 MG tablet Take 1 tablet (40 mg total) by mouth daily at 6 PM.  . levETIRAcetam (KEPPRA) 500 MG tablet Take 1 tablet (500 mg total) by mouth 2 (two) times daily.  . pantoprazole (PROTONIX) 40 MG tablet Take 1 tablet (40 mg total) by mouth daily.   No facility-administered encounter medications on file as of 11/30/2016.     Past Medical History:  Diagnosis Date  . Erosive esophagitis   . Hiccups   . Hyperlipidemia   . Hypertension   . Neuromuscular  disorder ([place])   . Seizures ([place])   . Stroke ([place])   . Substance abuse    alcohol    Past Surgical History:  Procedure Laterality Date  . COLON SURGERY    . COLONOSCOPY N/A 03/08/2016   Procedure: COLONOSCOPY;  Surgeon: Daneil Dolin, MD;  [facility];  Service: Endoscopy;  Laterality: N/A;  1300-moved to 1400  . ESOPHAGOGASTRODUODENOSCOPY N/A 10/05/2015   Dr. Gala Romney: erosive reflux esophagitis, non-critcal Schatki's ring non-manipulated  . ESOPHAGOGASTRODUODENOSCOPY N/A 12/15/2015   Dr. Rourk:non bleeding esophageal ulcer/small HH  . ESOPHAGOGASTRODUODENOSCOPY N/A 03/08/2016   Procedure: ESOPHAGOGASTRODUODENOSCOPY (EGD);  Surgeon: Daneil Dolin, MD;  Location: [facility];  Service: Endoscopy;  Laterality: N/A;  . exploratory laparotomy     Dr. Tamala Julian 2006, SBO secondary to adhesions     Social History   Social History  . Marital status: Widowed    Spouse name: N/A  . Number of children: 2  . Years of education: 9   Occupational History  . retired    Social History Main Topics  . Smoking status: Never Smoker  . Smokeless tobacco: Never Used  . Alcohol use No     Comment: "I used to drink whiskey.  But I left it alone."    . Drug use:  No  . Sexual activity: Yes    Birth control/ protection: Post-menopausal   Other Topics Concern  . Not on file   Social History Narrative   Lives with lady friend [name]   Irene's daughter helps care for them    Family History  Problem Relation Age of Onset  . Heart disease Brother   . Colon cancer Neg Hx     Review of Systems  Constitutional: Negative for chills, fever and weight loss.  HENT: Negative for congestion and hearing loss.   Eyes: Negative for blurred vision and pain.  Respiratory: Negative for cough and shortness of breath.   Cardiovascular: Negative for chest pain and leg swelling.  Gastrointestinal: Negative for abdominal pain, constipation, diarrhea and heartburn.  Genitourinary: Negative for  dysuria and frequency.  Musculoskeletal: Negative for falls, joint pain and myalgias.       Gait disturbance from stroke  Neurological: Positive for focal weakness. Negative for dizziness, seizures and headaches.       R leg  Psychiatric/Behavioral: Positive for memory loss. Negative for depression. The patient is not nervous/anxious and does not have insomnia.     BP 120/88 (BP Location: Right Arm, Patient Position: Sitting, Cuff Size: Normal)   Pulse 84   Temp 98.6 F (37 C) (Temporal)   Resp 18   Ht 5\' 5"  (1.651 m)   Wt 155 lb 1.3 oz (70.3 kg)   SpO2 96%   BMI 25.81 kg/m   Physical Exam  Constitutional: He is oriented to person, place, and time. He appears well-developed and well-nourished. No distress.  Jovial, joking.  Antalgic gait  HENT:  Head: Normocephalic and atraumatic.  Right Ear: External ear normal.  Left Ear: External ear normal.  Mouth/Throat: Oropharynx is clear and moist.  Teeth well repaired  Eyes: Conjunctivae are normal. Pupils are equal, round, and reactive to light.  Neck: Normal range of motion. Neck supple.  No bruit  Cardiovascular: Normal rate, regular rhythm and normal heart sounds.   Pulmonary/Chest: Effort normal.  Abdominal: Soft. Bowel sounds are normal. He exhibits no distension. There is no tenderness.  Musculoskeletal: Normal range of motion. He exhibits no edema.  Lymphadenopathy:    He has no cervical adenopathy.  Neurological: He is alert and oriented to person, place, and time. He displays abnormal reflex.  Psychiatric: Judgment and thought content normal. His affect is inappropriate. Cognition and memory are normal.  He jokes and is hard to get history - straight answer from. Silly.   He did well on mini cog exam and remembered all 3 objects and drew complete correct clock   ASSESSMENT/PLAN:   1. H/O: CVA (cerebrovascular accident)  - CBC - COMPLETE METABOLIC PANEL WITH GFR - Urinalysis, Routine w reflex microscopic - Lipid  panel - Levetiracetam level  2. Ulcer of esophagus with bleeding Back on protonix  3. Essential hypertension Not on medcine  4. Hyperlipidemia, unspecified hyperlipidemia type   5. Mild cognitive impairment with memory loss By report.  Some difficulty with history.  Did well on cog exam.    Patient Instructions  Take the levetiracetam twice a day morning and night Take the atorvastatin once a day in the evening Take the protonix once a day on an empty stomach in the morning Take the aspirin once a day with food You are on 4 different medicines to take every day  See me in 3 months Need blood work prior to next visit  You will not be cleared to drive  until six months after your last seizure ( January 2018)   Raylene Everts, MD

## 2016-11-30 NOTE — Patient Instructions (Signed)
Take the levetiracetam twice a day morning and night Take the atorvastatin once a day in the evening Take the protonix once a day on an empty stomach in the morning Take the aspirin once a day with food You are on 4 different medicines to take every day  See me in 3 months Need blood work prior to next visit  You will not be cleared to drive until six months after your last seizure ( January 2018)

## 2017-01-10 ENCOUNTER — Encounter (HOSPITAL_COMMUNITY): Payer: Self-pay | Admitting: Emergency Medicine

## 2017-01-10 ENCOUNTER — Emergency Department (HOSPITAL_COMMUNITY)
Admission: EM | Admit: 2017-01-10 | Discharge: 2017-01-10 | Disposition: A | Discharge status: Home / Self Care | Payer: Medicare Other | Attending: Emergency Medicine | Admitting: Emergency Medicine

## 2017-01-10 DIAGNOSIS — Z79899 Other long term (current) drug therapy: Secondary | ICD-10-CM | POA: Insufficient documentation

## 2017-01-10 DIAGNOSIS — Z7982 Long term (current) use of aspirin: Secondary | ICD-10-CM | POA: Diagnosis not present

## 2017-01-10 DIAGNOSIS — I10 Essential (primary) hypertension: Secondary | ICD-10-CM | POA: Insufficient documentation

## 2017-01-10 DIAGNOSIS — R112 Nausea with vomiting, unspecified: Secondary | ICD-10-CM | POA: Insufficient documentation

## 2017-01-10 LAB — COMPREHENSIVE METABOLIC PANEL
ALT: 25 U/L (ref 17–63)
AST: 24 U/L (ref 15–41)
Albumin: 4.3 g/dL (ref 3.5–5.0)
Alkaline Phosphatase: 86 U/L (ref 38–126)
Anion gap: 12 (ref 5–15)
BUN: 31 mg/dL — AB (ref 6–20)
CHLORIDE: 97 mmol/L — AB (ref 101–111)
CO2: 27 mmol/L (ref 22–32)
CREATININE: 1.12 mg/dL (ref 0.61–1.24)
Calcium: 8.9 mg/dL (ref 8.9–10.3)
GFR calc non Af Amer: >60 mL/min (ref >60)
Glucose, Bld: 131 mg/dL — ABNORMAL HIGH (ref 65–99)
POTASSIUM: 3.9 mmol/L (ref 3.5–5.1)
SODIUM: 136 mmol/L (ref 135–145)
TOTAL PROTEIN: 7.9 g/dL (ref 6.5–8.1)
Total Bilirubin: 1 mg/dL (ref 0.3–1.2)

## 2017-01-10 LAB — URINALYSIS, ROUTINE W REFLEX MICROSCOPIC
BILIRUBIN URINE: NEGATIVE
Bacteria, UA: NONE SEEN
GLUCOSE, UA: NEGATIVE mg/dL
Ketones, ur: 5 mg/dL — AB
LEUKOCYTES UA: NEGATIVE
NITRITE: NEGATIVE
PH: 5 (ref 5.0–8.0)
Protein, ur: NEGATIVE mg/dL
Specific Gravity, Urine: 1.026 (ref 1.005–1.030)

## 2017-01-10 LAB — CBC
HCT: 47.4 % (ref 39.0–52.0)
Hemoglobin: 16.2 g/dL (ref 13.0–17.0)
MCH: 29.1 pg (ref 26.0–34.0)
MCHC: 34.2 g/dL (ref 30.0–36.0)
MCV: 85.3 fL (ref 78.0–100.0)
Platelets: 208 10*3/uL (ref 150–400)
RBC: 5.56 MIL/uL (ref 4.22–5.81)
RDW: 14.2 % (ref 11.5–15.5)
WBC: 11.1 10*3/uL — AB (ref 4.0–10.5)

## 2017-01-10 LAB — LIPASE, BLOOD: LIPASE: 21 U/L (ref 11–51)

## 2017-01-10 MED ORDER — ONDANSETRON 4 MG PO TBDP: 4.0000 mg | ORAL_TABLET | Freq: Three times a day (TID) | ORAL | 0 refills | Status: DC | PRN

## 2017-01-10 MED ORDER — MECLIZINE HCL 12.5 MG PO TABS: 12.5000 mg | ORAL_TABLET | Freq: Once | ORAL | Status: DC

## 2017-01-10 MED ORDER — ONDANSETRON HCL 4 MG/2ML IJ SOLN
4.0000 mg | Freq: Once | INTRAMUSCULAR | Status: AC
  Administered 2017-01-10: 4 mg via INTRAVENOUS
  Filled 2017-01-10: qty 2

## 2017-01-10 MED ORDER — SODIUM CHLORIDE 0.9 % IV BOLUS (SEPSIS)
500.0000 mL | Freq: Once | INTRAVENOUS | Status: AC
  Administered 2017-01-10: 500 mL via INTRAVENOUS

## 2017-01-10 NOTE — Discharge Instructions (Signed)

## 2017-01-10 NOTE — ED Provider Notes (Signed)
Emergency Department Provider Note   I have reviewed the triage vital signs and the nursing notes.   HISTORY  Chief Complaint Emesis   HPI Joshua Mckee is a 76 y.o. male with PMH of HLD, HTN, CVA, and seizures presents to the emergency department for evaluation of multiple episodes of nausea and vomiting overnight. Patient states that he drinks some old, sweet milk and ate some fruit prior to going to bed. He woke up with multiple episodes of emesis throughout the night. Denies any diarrhea. No associated chest pain, palpitations, difficulty breathing, generalized weakness. No other sick contacts. Patient states his girlfriend blames his symptoms on the milk. He denies any abdominal pain or nausea at this time. He does note some left-sided numbness that has been present for approximately one year. He has been compliant with his medications including aspirin and seizure medication. Denies any fever or chills.   Past Medical History:  Diagnosis Date  . Erosive esophagitis   . Hiccups   . Hyperlipidemia   . Hypertension   . Neuromuscular disorder ([place])   . Seizures ([place])   . Stroke ([place])   . Substance abuse    alcohol    Patient Active Problem List   Diagnosis Date Noted  . Mild cognitive impairment with memory loss 11/30/2016  . Hyperlipidemia 11/30/2016  . Seizure ([place]) 10/15/2016  . Hyperglycemia 10/15/2016  . Acute ischemic stroke ([place]) 08/27/2016  . Hypertension 08/27/2016  . Esophageal ulcer 02/27/2016  . GI bleed 12/15/2015  . ETOH abuse 12/14/2015  . Reflux esophagitis   . Hiatal hernia     Past Surgical History:  Procedure Laterality Date  . COLON SURGERY    . COLONOSCOPY N/A 03/08/2016   Procedure: COLONOSCOPY;  Surgeon: Daneil Dolin, MD;  [facility];  Service: Endoscopy;  Laterality: N/A;  1300-moved to 1400  . ESOPHAGOGASTRODUODENOSCOPY N/A 10/05/2015   Dr. Gala Romney: erosive reflux esophagitis, non-critcal Schatki's ring non-manipulated  .  ESOPHAGOGASTRODUODENOSCOPY N/A 12/15/2015   Dr. Rourk:non bleeding esophageal ulcer/small HH  . ESOPHAGOGASTRODUODENOSCOPY N/A 03/08/2016   Procedure: ESOPHAGOGASTRODUODENOSCOPY (EGD);  Surgeon: Daneil Dolin, MD;  [facility];  Service: Endoscopy;  Laterality: N/A;  . exploratory laparotomy     Dr. Tamala Julian 2006, SBO secondary to adhesions     Current Outpatient Rx  . Order #: [id number] Class: No Print  . Order #: [id number] Class: Normal  . Order #: [id number] Class: Normal  . Order #: [id number] Class: Print  . Order #: [id number] Class: Normal    Allergies Patient has no known allergies.  Family History  Problem Relation Age of Onset  . Heart disease Brother   . Colon cancer Neg Hx     Social History Social History  Substance Use Topics  . Smoking status: Never Smoker  . Smokeless tobacco: Never Used  . Alcohol use No     Comment: "I used to drink whiskey.  But I left it alone."      Review of Systems  Constitutional: No fever/chills Eyes: No visual changes. ENT: No sore throat. Cardiovascular: Denies chest pain. Respiratory: Denies shortness of breath. Gastrointestinal: No abdominal pain. Positive nausea and vomiting.  No diarrhea.  No constipation. Genitourinary: Negative for dysuria. Musculoskeletal: Negative for back pain. Skin: Negative for rash. Neurological: Negative for headaches, focal weakness. Chronic left leg numbness.   10-point ROS otherwise negative.  ____________________________________________   PHYSICAL EXAM:  VITAL SIGNS: ED Triage Vitals  Enc Vitals Group     BP 01/10/17 0617 114/88  Pulse Rate 01/10/17 0617 (!) 102     Resp 01/10/17 0617 20     Temp 01/10/17 0617 97.8 F (36.6 C)     Temp src --      SpO2 01/10/17 0617 96 %     Weight 01/10/17 0616 155 lb (70.3 kg)    Constitutional: Alert and oriented. Well appearing and in no acute distress. Eyes: Conjunctivae are normal.  Head: Atraumatic. Nose: No  congestion/rhinnorhea. Mouth/Throat: Mucous membranes are moist.  Oropharynx non-erythematous. Neck: No stridor.   Cardiovascular: Normal rate, regular rhythm. Good peripheral circulation. Grossly normal heart sounds.   Respiratory: Normal respiratory effort.  No retractions. Lungs CTAB. Gastrointestinal: Soft and nontender. No distention.  Musculoskeletal: No lower extremity tenderness nor edema. No gross deformities of extremities. Neurologic:  Normal speech and language. No gross focal neurologic deficits are appreciated.  Skin:  Skin is warm, dry and intact. No rash noted.  ____________________________________________   LABS (all labs ordered are listed, but only abnormal results are displayed)  Labs Reviewed  COMPREHENSIVE METABOLIC PANEL - Abnormal; Notable for the following:       Result Value   Chloride 97 (*)    Glucose, Bld 131 (*)    BUN 31 (*)    All other components within normal limits  CBC - Abnormal; Notable for the following:    WBC 11.1 (*)    All other components within normal limits  URINALYSIS, ROUTINE W REFLEX MICROSCOPIC - Abnormal; Notable for the following:    APPearance HAZY (*)    Hgb urine dipstick SMALL (*)    Ketones, ur 5 (*)    Squamous Epithelial / LPF 0-5 (*)    All other components within normal limits  LIPASE, BLOOD   ____________________________________________  RADIOLOGY  None ____________________________________________   PROCEDURES  Procedure(s) performed:   Procedures  None ____________________________________________   INITIAL IMPRESSION / ASSESSMENT AND PLAN / ED COURSE  Pertinent labs & imaging results that were available during my care of the patient were reviewed by me and considered in my medical decision making (see chart for details).  Patient resents to the emergency department for evaluation of nausea and vomiting. He has a nontender abdomen. No diarrhea. He is awake, alert, very well-appearing. He is  complaining of some subjective numbness in his left leg primarily has been ongoing for the last year. During hospitalization January the patient had a normal MRI. On my exam I cannot appreciate any sensation difference or other focal neurological deficit. No evidence to suggest atypical ACS in this case. Tracing obtained by EMS reviewed at bedside with no acute abnormalities. Plan for IVF, labs, and zofran with PO challenge.   08:49 AM Patient is tolerating PO and feeling better after IVF. Discharge home with Zofran and plan for PCP follow up. Discussed return precautions in detail.   At this time, I do not feel there is any life-threatening condition present. I have reviewed and discussed all results (EKG, imaging, lab, urine as appropriate), exam findings with patient. I have reviewed nursing notes and appropriate previous records.  I feel the patient is safe to be discharged home without further emergent workup. Discussed usual and customary return precautions. Patient and family (if present) verbalize understanding and are comfortable with this plan.  Patient will follow-up with their primary care provider. If they do not have a primary care provider, information for follow-up has been provided to them. All questions have been answered.  ____________________________________________  FINAL CLINICAL IMPRESSION(S) / ED  DIAGNOSES  Final diagnoses:  Non-intractable vomiting with nausea, unspecified vomiting type     MEDICATIONS GIVEN DURING THIS VISIT:  Medications  sodium chloride 0.9 % bolus 500 mL (500 mLs Intravenous New Bag/Given 01/10/17 0641)  ondansetron (ZOFRAN) injection 4 mg (4 mg Intravenous Given 01/10/17 0641)     NEW OUTPATIENT MEDICATIONS STARTED DURING THIS VISIT:  New Prescriptions   ONDANSETRON (ZOFRAN ODT) 4 MG DISINTEGRATING TABLET    Take 1 tablet (4 mg total) by mouth every 8 (eight) hours as needed for nausea or vomiting.      Note:  This document was prepared  using Dragon voice recognition software and may include unintentional dictation errors.  Nanda Quinton, MD Emergency Medicine   Margette Fast, MD 01/10/17 [phone]

## 2017-01-10 NOTE — ED Notes (Signed)
Patient states he is still feeling sick to his stomach so patient has been given ice chips at this time for PO challenge.

## 2017-01-10 NOTE — ED Triage Notes (Addendum)
Pt started vomiting yesterday and c/o weakness. Pt states he drank 17 week old milk last night before going to bed. Pt states he has not vomited in the last couple of hours.

## 2017-01-11 ENCOUNTER — Encounter (HOSPITAL_COMMUNITY): Payer: Self-pay

## 2017-01-11 ENCOUNTER — Emergency Department (HOSPITAL_COMMUNITY)
Admission: EM | Admit: 2017-01-11 | Discharge: 2017-01-11 | Disposition: A | Discharge status: Home / Self Care | Payer: Medicare Other | Attending: Emergency Medicine | Admitting: Emergency Medicine

## 2017-01-11 DIAGNOSIS — I10 Essential (primary) hypertension: Secondary | ICD-10-CM | POA: Diagnosis not present

## 2017-01-11 DIAGNOSIS — Z7982 Long term (current) use of aspirin: Secondary | ICD-10-CM | POA: Diagnosis not present

## 2017-01-11 DIAGNOSIS — R066 Hiccough: Secondary | ICD-10-CM | POA: Insufficient documentation

## 2017-01-11 MED ORDER — CHLORPROMAZINE HCL 25 MG PO TABS
25.0000 mg | ORAL_TABLET | Freq: Once | ORAL | Status: AC
  Administered 2017-01-11: 25 mg via ORAL
  Filled 2017-01-11: qty 1

## 2017-01-11 MED ORDER — CHLORPROMAZINE HCL 25 MG PO TABS: 25.0000 mg | ORAL_TABLET | Freq: Two times a day (BID) | ORAL | 0 refills | Status: DC | PRN

## 2017-01-11 NOTE — ED Triage Notes (Signed)
Hiccups since last night, was in the e.d. Yesterday for vomiting

## 2017-01-11 NOTE — ED Notes (Signed)
Pt wheeled to waiting room. Pt verbalized understanding of discharge instructions.   

## 2017-01-11 NOTE — ED Notes (Signed)
Pt stated "I feel much better now."

## 2017-01-11 NOTE — ED Provider Notes (Signed)
[facility] DEPT Provider Note   CSN: [id number] Arrival date & time: 01/11/17  0454     History   Chief Complaint Chief Complaint  Patient presents with  . Hiccups    HPI Joshua Mckee is a 76 y.o. male.  The history is provided by the patient.  Patient presents with worsening hiccups over past several hrs He reports due to hiccups he is having abdominal discomfort No fever/vomiting No cp/sob No pain complaints at this time Nothing is improving his symptoms He has had this before but is unsure which medications have worked Seen in the ED on 4/26 for vomiting - that has since resolved but now has hiccups   Past Medical History:  Diagnosis Date  . Erosive esophagitis   . Hiccups   . Hyperlipidemia   . Hypertension   . Neuromuscular disorder ([place])   . Seizures ([place])   . Stroke ([place])   . Substance abuse    alcohol    Patient Active Problem List   Diagnosis Date Noted  . Mild cognitive impairment with memory loss 11/30/2016  . Hyperlipidemia 11/30/2016  . Seizure ([place]) 10/15/2016  . Hyperglycemia 10/15/2016  . Acute ischemic stroke ([place]) 08/27/2016  . Hypertension 08/27/2016  . Esophageal ulcer 02/27/2016  . GI bleed 12/15/2015  . ETOH abuse 12/14/2015  . Reflux esophagitis   . Hiatal hernia     Past Surgical History:  Procedure Laterality Date  . COLON SURGERY    . COLONOSCOPY N/A 03/08/2016   Procedure: COLONOSCOPY;  Surgeon: Daneil Dolin, MD;  Location: [facility];  Service: Endoscopy;  Laterality: N/A;  1300-moved to 1400  . ESOPHAGOGASTRODUODENOSCOPY N/A 10/05/2015   Dr. Gala Romney: erosive reflux esophagitis, non-critcal Schatki's ring non-manipulated  . ESOPHAGOGASTRODUODENOSCOPY N/A 12/15/2015   Dr. Rourk:non bleeding esophageal ulcer/small HH  . ESOPHAGOGASTRODUODENOSCOPY N/A 03/08/2016   Procedure: ESOPHAGOGASTRODUODENOSCOPY (EGD);  Surgeon: Daneil Dolin, MD;  Location: [facility];  Service: Endoscopy;  Laterality: N/A;  . exploratory  laparotomy     Dr. Tamala Julian 2006, SBO secondary to adhesions        Home Medications    Prior to Admission medications   Medication Sig Start Date End Date Taking? Authorizing Provider  aspirin EC 81 MG EC tablet Take 1 tablet (81 mg total) by mouth daily. 08/30/16   Erline Hau, MD  atorvastatin (LIPITOR) 40 MG tablet Take 1 tablet (40 mg total) by mouth daily at 6 PM. 11/30/16   Raylene Everts, MD  levETIRAcetam (KEPPRA) 500 MG tablet Take 1 tablet (500 mg total) by mouth 2 (two) times daily. 11/30/16   Raylene Everts, MD  ondansetron (ZOFRAN ODT) 4 MG disintegrating tablet Take 1 tablet (4 mg total) by mouth every 8 (eight) hours as needed for nausea or vomiting. 01/10/17   Margette Fast, MD  pantoprazole (PROTONIX) 40 MG tablet Take 1 tablet (40 mg total) by mouth daily. 11/30/16   Raylene Everts, MD    Family History Family History  Problem Relation Age of Onset  . Heart disease Brother   . Colon cancer Neg Hx     Social History Social History  Substance Use Topics  . Smoking status: Never Smoker  . Smokeless tobacco: Never Used  . Alcohol use No     Comment: "I used to drink whiskey.  But I left it alone."       Allergies   Patient has no known allergies.   Review of Systems Review of  Systems  Constitutional: Negative for fever.  Cardiovascular: Negative for chest pain.  Gastrointestinal:       Hiccups   All other systems reviewed and are negative.    Physical Exam Updated Vital Signs BP 129/88 (BP Location: Left Arm)   Pulse 80   Temp 97.9 F (36.6 C) (Oral)   Resp 20   SpO2 98%   Physical Exam  CONSTITUTIONAL: Well developed/well nourished, pt hiccups frequently HEAD: Normocephalic/atraumatic EYES: EOMI/PERRL ENMT: Mucous membranes moist, uvula midline, no edema noted NECK: supple no meningeal signs SPINE/BACK:entire spine nontender CV: S1/S2 noted, no murmurs/rubs/gallops noted LUNGS: Lungs are clear to auscultation  bilaterally, no apparent distress ABDOMEN: soft, nontender NEURO: Pt is awake/alert/appropriate, moves all extremitiesx4.  No facial droop.   EXTREMITIES:   full ROM SKIN: warm, color normal PSYCH: no abnormalities of mood noted, alert and oriented to situation  ED Treatments / Results  Labs (all labs ordered are listed, but only abnormal results are displayed) Labs Reviewed - No data to display  EKG  EKG Interpretation None       Radiology No results found.  Procedures Procedures (including critical care time)  Medications Ordered in ED Medications  chlorproMAZINE (THORAZINE) tablet 25 mg (25 mg Oral Given 01/11/17 0517)     Initial Impression / Assessment and Plan / ED Course  I have reviewed the triage vital signs and the nursing notes.      Pt with long h/o hiccups, has been seen in ED previously for this issue While in the ED tonight he improved with thorazine He is resting comfortably/no distress Will give short course of thorazine, and recommend followup with GI   Final Clinical Impressions(s) / ED Diagnoses   Final diagnoses:  Singultus    New Prescriptions New Prescriptions   CHLORPROMAZINE (THORAZINE) 25 MG TABLET    Take 1 tablet (25 mg total) by mouth 2 (two) times daily as needed for hiccoughs.     Ripley Fraise, MD 01/11/17 [phone]

## 2017-01-29 ENCOUNTER — Ambulatory Visit (INDEPENDENT_AMBULATORY_CARE_PROVIDER_SITE_OTHER): Payer: Medicare Other | Admitting: Family Medicine

## 2017-01-29 ENCOUNTER — Encounter: Payer: Self-pay | Admitting: Family Medicine

## 2017-01-29 VITALS — BP 111/82 | HR 95 | Temp 97.6°F | Ht 65.0 in | Wt 151.2 lb

## 2017-01-29 DIAGNOSIS — R350 Frequency of micturition: Secondary | ICD-10-CM

## 2017-01-29 DIAGNOSIS — R739 Hyperglycemia, unspecified: Secondary | ICD-10-CM | POA: Diagnosis not present

## 2017-01-29 DIAGNOSIS — N401 Enlarged prostate with lower urinary tract symptoms: Secondary | ICD-10-CM

## 2017-01-29 DIAGNOSIS — E785 Hyperlipidemia, unspecified: Secondary | ICD-10-CM

## 2017-01-29 DIAGNOSIS — I10 Essential (primary) hypertension: Secondary | ICD-10-CM | POA: Diagnosis not present

## 2017-01-29 LAB — URINALYSIS, COMPLETE
Bilirubin, UA: NEGATIVE
Glucose, UA: NEGATIVE
KETONES UA: NEGATIVE
LEUKOCYTES UA: NEGATIVE
Nitrite, UA: NEGATIVE
SPEC GRAV UA: 1.02 (ref 1.005–1.030)
Urobilinogen, Ur: 2 mg/dL — ABNORMAL HIGH (ref 0.2–1.0)
pH, UA: 7.5 (ref 5.0–7.5)

## 2017-01-29 LAB — MICROSCOPIC EXAMINATION: Bacteria, UA: NONE SEEN

## 2017-01-29 MED ORDER — TAMSULOSIN HCL 0.4 MG PO CAPS: 0.4000 mg | ORAL_CAPSULE | Freq: Every day | ORAL | 3 refills | Status: DC

## 2017-01-29 NOTE — Progress Notes (Addendum)
HPI  Patient presents today here to establish care.  Patient has concern about frequent urination and red eye today.  He has a history of lacunar infarct in December 2017, he then had seizure episodes in January 29 that have been treated with Keppra very well since that time.  She describes frequent urination with sometimes difficulty urinating as well as a small amount of urination after he goes. He denies weak stream. He does have dribbling.  She has 3 days history of red eye, it has not changed much. No trauma. No change in vision, itching, pain, or pressure.  PMH: Esophageal ulcer, hypertension, hyperlipidemia, history of lacunar infarct, seizure disorder Past surgical history: EGD 3, colonoscopy, exploratory laparotomy secondary to SBO from adhesions. 2006 Past social history: Never smoker, no alcohol or drugs ROS: Per HPI  Objective: BP 111/82   Pulse 95   Temp 97.6 F (36.4 C) (Oral)   Ht 5' 5" (1.651 m)   Wt 151 lb 3.2 oz (68.6 kg)   BMI 25.16 kg/m  Gen: NAD, alert, cooperative with exam HEENT: NCAT, subconjunctival hemorrhage on the left, PERRLA, EOMI, grossly normal vision CV: RRR, good S1/S2, no murmur Resp: CTABL, no wheezes, non-labored Abd: SNTND, BS present, no guarding or organomegaly Ext: No edema, warm Neuro: Alert and oriented, strength 5/5 and sensation intact in bilateral lower extremities, 2+ patellar tendon reflex on the left versus 1+ on the right  DRE: Enlarged prostate, symmetric, no nodules.   Assessment and plan:  # BPH New diagnosis Start Flomax PSA ordered  # Hypertension Diet controlled, no medications, none needed at this time  # Hyperlipidemia Statin started after CVA, repeat labs  # Subconjunctival hemorrhage Reassurance, low threshold for calling back or seeking further attention if vision changes in any way.  Hyperglycemia Previous A1c 5.7 Repeat labs including A1c, this could be contributing to polyuria if he has  progressed to diabetes    Orders Placed This Encounter  Procedures  . Urinalysis, Complete  . CMP14+EGFR  . CBC with Differential/Platelet  . Lipid panel  . PSA  . Hemoglobin A1c    Meds ordered this encounter  Medications  . tamsulosin (FLOMAX) 0.4 MG CAPS capsule    Sig: Take 1 capsule (0.4 mg total) by mouth daily.    Dispense:  30 capsule    Refill:  3    Sam Bradshaw, MD [facility] 01/29/2017, 1:57 PM     

## 2017-01-29 NOTE — Patient Instructions (Signed)
Great to meet you!  Start flomax 1 pill once daily to help your urination.

## 2017-01-30 LAB — CMP14+EGFR
ALK PHOS: 88 IU/L (ref 39–117)
ALT: 16 IU/L (ref 0–44)
AST: 17 IU/L (ref 0–40)
Albumin/Globulin Ratio: 1.2 (ref 1.2–2.2)
Albumin: 3.8 g/dL (ref 3.5–4.8)
BILIRUBIN TOTAL: 0.4 mg/dL (ref 0.0–1.2)
BUN/Creatinine Ratio: 18 (ref 10–24)
BUN: 14 mg/dL (ref 8–27)
CHLORIDE: 98 mmol/L (ref 96–106)
CO2: 25 mmol/L (ref 18–29)
Calcium: 8.5 mg/dL — ABNORMAL LOW (ref 8.6–10.2)
Creatinine, Ser: 0.79 mg/dL (ref 0.76–1.27)
GFR calc non Af Amer: 87 mL/min/{1.73_m2} (ref >59)
GFR, EST AFRICAN AMERICAN: 100 mL/min/{1.73_m2} (ref >59)
Globulin, Total: 3.2 g/dL (ref 1.5–4.5)
Glucose: 83 mg/dL (ref 65–99)
POTASSIUM: 4 mmol/L (ref 3.5–5.2)
Sodium: 138 mmol/L (ref 134–144)
TOTAL PROTEIN: 7 g/dL (ref 6.0–8.5)

## 2017-01-30 LAB — CBC WITH DIFFERENTIAL/PLATELET
BASOS ABS: 0 10*3/uL (ref 0.0–0.2)
Basos: 0 %
EOS (ABSOLUTE): 0.2 10*3/uL (ref 0.0–0.4)
Eos: 3 %
Hematocrit: 40.3 % (ref 37.5–51.0)
Hemoglobin: 13.1 g/dL (ref 13.0–17.7)
IMMATURE GRANS (ABS): 0 10*3/uL (ref 0.0–0.1)
Immature Granulocytes: 0 %
LYMPHS ABS: 1.6 10*3/uL (ref 0.7–3.1)
LYMPHS: 24 %
MCH: 28 pg (ref 26.6–33.0)
MCHC: 32.5 g/dL (ref 31.5–35.7)
MCV: 86 fL (ref 79–97)
Monocytes Absolute: 0.7 10*3/uL (ref 0.1–0.9)
Monocytes: 10 %
NEUTROS ABS: 4.2 10*3/uL (ref 1.4–7.0)
Neutrophils: 63 %
Platelets: 240 10*3/uL (ref 150–379)
RBC: 4.68 x10E6/uL (ref 4.14–5.80)
RDW: 14.6 % (ref 12.3–15.4)
WBC: 6.7 10*3/uL (ref 3.4–10.8)

## 2017-01-30 LAB — HEMOGLOBIN A1C
Est. average glucose Bld gHb Est-mCnc: 120 mg/dL
Hgb A1c MFr Bld: 5.8 % — ABNORMAL HIGH (ref 4.8–5.6)

## 2017-01-30 LAB — LIPID PANEL
CHOL/HDL RATIO: 2.8 ratio (ref 0.0–5.0)
Cholesterol, Total: 121 mg/dL (ref 100–199)
HDL: 44 mg/dL (ref >39)
LDL Calculated: 67 mg/dL (ref 0–99)
TRIGLYCERIDES: 49 mg/dL (ref 0–149)
VLDL Cholesterol Cal: 10 mg/dL (ref 5–40)

## 2017-01-30 LAB — PSA: PROSTATE SPECIFIC AG, SERUM: 974 ng/mL — AB (ref 0.0–4.0)

## 2017-01-31 ENCOUNTER — Telehealth: Payer: Self-pay | Admitting: Family Medicine

## 2017-01-31 DIAGNOSIS — N400 Enlarged prostate without lower urinary tract symptoms: Secondary | ICD-10-CM

## 2017-01-31 DIAGNOSIS — R972 Elevated prostate specific antigen [PSA]: Secondary | ICD-10-CM

## 2017-01-31 NOTE — Telephone Encounter (Signed)
Called and discussed very elevated PSA with he and his girlfriend.  He understands the need to see a urologist and likely have prostate cancer.   Laroy Apple, MD [facility] 01/31/2017, 1:00 PM

## 2017-02-14 ENCOUNTER — Other Ambulatory Visit: Payer: Self-pay | Admitting: Urology

## 2017-02-14 DIAGNOSIS — C61 Malignant neoplasm of prostate: Secondary | ICD-10-CM

## 2017-02-14 DIAGNOSIS — R972 Elevated prostate specific antigen [PSA]: Secondary | ICD-10-CM

## 2017-02-18 ENCOUNTER — Ambulatory Visit (HOSPITAL_COMMUNITY)
Admission: RE | Admit: 2017-02-18 | Discharge: 2017-02-18 | Disposition: A | Discharge status: Home / Self Care | Payer: Medicare Other | Source: Ambulatory Visit | Attending: Urology | Admitting: Urology

## 2017-02-18 ENCOUNTER — Other Ambulatory Visit: Payer: Self-pay | Admitting: Urology

## 2017-02-18 ENCOUNTER — Encounter (HOSPITAL_COMMUNITY): Payer: Self-pay

## 2017-02-18 ENCOUNTER — Encounter (HOSPITAL_COMMUNITY)
Admission: RE | Admit: 2017-02-18 | Discharge: 2017-02-18 | Disposition: A | Discharge status: Home / Self Care | Payer: Medicare Other | Source: Ambulatory Visit | Attending: Urology | Admitting: Urology

## 2017-02-18 ENCOUNTER — Telehealth: Payer: Self-pay | Admitting: Family Medicine

## 2017-02-18 DIAGNOSIS — R590 Localized enlarged lymph nodes: Secondary | ICD-10-CM | POA: Diagnosis not present

## 2017-02-18 DIAGNOSIS — C61 Malignant neoplasm of prostate: Secondary | ICD-10-CM | POA: Diagnosis present

## 2017-02-18 DIAGNOSIS — R972 Elevated prostate specific antigen [PSA]: Secondary | ICD-10-CM | POA: Insufficient documentation

## 2017-02-18 DIAGNOSIS — I723 Aneurysm of iliac artery: Secondary | ICD-10-CM | POA: Diagnosis not present

## 2017-02-18 MED ORDER — TECHNETIUM TC 99M MEDRONATE IV KIT
25.0000 | PACK | Freq: Once | INTRAVENOUS | Status: AC | PRN
  Administered 2017-02-18: 20.7 via INTRAVENOUS

## 2017-02-18 MED ORDER — IOPAMIDOL (ISOVUE-300) INJECTION 61%
100.0000 mL | Freq: Once | INTRAVENOUS | Status: AC | PRN
  Administered 2017-02-18: 100 mL via INTRAVENOUS

## 2017-02-18 NOTE — Telephone Encounter (Signed)
He is no longer my patient.  He went and established elsewhere

## 2017-02-18 NOTE — Telephone Encounter (Signed)
Patients daughter is requesting to speak to someone regarding dads care. She has just found out about his cancer she is afraid he doesn't understand everything he is being told, and is having a hard time.  Cb#: [phone] (daughter-Patricia Vanwey) *she is on the hipaa

## 2017-02-20 ENCOUNTER — Other Ambulatory Visit: Payer: Self-pay | Admitting: Urology

## 2017-02-20 DIAGNOSIS — C61 Malignant neoplasm of prostate: Secondary | ICD-10-CM

## 2017-02-21 ENCOUNTER — Telehealth: Payer: Self-pay | Admitting: Family Medicine

## 2017-02-21 NOTE — Telephone Encounter (Signed)
Colletta [place] from Adult [employer]ctive services called to verify that patient would like to keep Dr.Nelson as his pcp because she is closer to his home.  He went to Dr.Bradshaw (on epic, notes are in chart) a few weeks ago, and was referred to [facility] urology, went Monday for CT/bone scan, found out he has cancer, he is set up for a procedure 03/05/17 for biopsy.   Lots of miscommunication on patients part she is trying to sort this out so patient gets his care.  Cb#: [phone] ext [phone] (stephanie)   Requesting a call back

## 2017-02-21 NOTE — Telephone Encounter (Signed)
[facility] to see him.  Do not know why he went elsewhere.  Do they feel he has memory impairment?

## 2017-02-26 ENCOUNTER — Encounter (HOSPITAL_BASED_OUTPATIENT_CLINIC_OR_DEPARTMENT_OTHER): Payer: Self-pay | Admitting: *Deleted

## 2017-02-27 ENCOUNTER — Encounter (HOSPITAL_BASED_OUTPATIENT_CLINIC_OR_DEPARTMENT_OTHER): Payer: Self-pay | Admitting: *Deleted

## 2017-02-27 NOTE — Progress Notes (Signed)
SPOKE W/ PT'S SIG. OTHER, IRENE CANTRELL, AND PT OVER HER PHONE.  NPO AFTER MN.  ARRIVE AT 0800.  NEEDS ISTAT 8. CURRENT EKG IN CHART AND EPIC.  WILL TAKE AM MEDS DOS W/ SIPS OF WATER AND WILL DO FLEET ENEMA .   PT POOR HISTORIAN WILL NEED SIG. OTHER IN PRE-OP.

## 2017-03-04 NOTE — H&P (Signed)
CC: Elevated PSA--New Patient/Consult  HPI: Joshua Mckee is a 76 year-old male patient who is here evaluation of his PSA.  He is a patient of Dr. Laroy Apple seen in office consultation today. His last PSA was performed 01/29/2017. The last PSA value was 974.   Joshua Mckee is a 76 yo BM who is sent by Dr. Laroy Apple for an elevated PSA of 974 on 01/29/17. The patient presented with frequency and some post void dribbling. His exam was reported as benign. He has no prior PSA's available for review.      ALLERGIES: None   MEDICATIONS: Aspirin  Tamsulosin Hcl 0.4 mg capsule, ext release 24 hr  " Unsure Of Meds"  Atorvastatin Calcium  Chlorpromazine Hcl  Keppra  Pantoprazole Sodium     GU PSH: None   NON-GU PSH: Partial colectomy    GU PMH: None   NON-GU PMH: Arthritis Hypertension Liver Disease Other psychoactive substance abuse, uncomplicated Other seizures Seizure disorder Stroke/TIA    FAMILY HISTORY: 1 son - Other 4 daughters - Other Prostate Cancer - No Family History   SOCIAL HISTORY: Marital Status: Widowed Current Smoking Status: Patient has never smoked.   Tobacco Use Assessment Completed: Used Tobacco in last 30 days? Does not drink anymore.  Does not drink caffeine.     Notes: Retired   REVIEW OF SYSTEMS:    GU Review Male:   Patient denies frequent urination, hard to postpone urination, burning/ pain with urination, get up at night to urinate, leakage of urine, stream starts and stops, trouble starting your stream, have to strain to urinate , erection problems, and penile pain.  Gastrointestinal (Upper):   Patient denies nausea, vomiting, and indigestion/ heartburn.  Gastrointestinal (Lower):   Patient denies diarrhea and constipation.  Constitutional:   He is unsure of the amount. Patient reports weight loss. Patient denies fever, night sweats, and fatigue.  Skin:   Patient denies skin rash/ lesion and itching.  Eyes:   Patient denies blurred vision  and double vision.  Ears/ Nose/ Throat:   Patient denies sore throat and sinus problems.  Hematologic/Lymphatic:   Patient denies swollen glands and easy bruising.  Cardiovascular:   Patient denies chest pains and leg swelling.  Respiratory:   Patient denies cough and shortness of breath.  Endocrine:   Patient denies excessive thirst.  Musculoskeletal:   Patient denies back pain and joint pain.  Neurological:   Patient denies headaches and dizziness.  Psychologic:   Patient denies depression and anxiety.   VITAL SIGNS:      02/14/2017 10:33 AM  Weight 150 lb / 68.04 kg  Height 65 in / 165.1 cm  BP 128/85 mmHg  Heart Rate 115 /min  BMI 25.0 kg/m   GU PHYSICAL EXAMINATION:    Anus and Perineum: No hemorrhoids. No anal stenosis. No rectal fissure, no anal fissure. No edema, no dimple, no perineal tenderness, no anal tenderness.  Scrotum: No lesions. No edema. No cysts. No warts.  Epididymides: Right: no spermatocele, no masses, no cysts, no tenderness, no induration, no enlargement. Left: no spermatocele, no masses, no cysts, no tenderness, no induration, no enlargement.  Testes: No tenderness, no swelling, no enlargement left testes. No tenderness, no swelling, no enlargement right testes. Normal location left testes. Normal location right testes. No mass, no cyst, no varicocele, no hydrocele left testes. No mass, no cyst, no varicocele, no hydrocele right testes.  Urethral Meatus: Normal size. No lesion, no wart, no discharge, no polyp. Normal  location.  Penis: Circumcised, no warts, no cracks. No dorsal Peyronie's plaques, no left corporal Peyronie's plaques, no right corporal Peyronie's plaques, no scarring, no warts. No balanitis, no meatal stenosis.  Prostate: Prostate 2 1/2+ size. Left lobe hard and gritty, right lobe firm. Prostate left lobe larger. No prostate nodule. Left lobe no tenderness, right lobe no tenderness.   Seminal Vesicles: Nonpalpable.  Sphincter Tone: Normal  sphincter. No rectal tenderness. No rectal mass.    MULTI-SYSTEM PHYSICAL EXAMINATION:    Constitutional: Well-nourished. No physical deformities. Normally developed. Good grooming.  Neck: Neck symmetrical, not swollen. Normal tracheal position.  Respiratory: No labored breathing, no use of accessory muscles. CTA  Cardiovascular: Normal temperature, RRR without murmur and no edema  Lymphatic: No enlargement of neck, axillae, groin.  Skin: No paleness, no jaundice, no cyanosis. No lesion, no ulcer, no rash.  Neurologic / Psychiatric: Oriented to time, oriented to place, oriented to person. No depression, no anxiety, no agitation.  Gastrointestinal: Midline scar. No mass, no tenderness, no rigidity, non obese abdomen.   Musculoskeletal: Normal gait and station of head and neck.     PAST DATA REVIEWED:  Source Of History:  Patient  Lab Test Review:   PSA  Records Review:   Previous Doctor Records  Urine Test Review:   Urinalysis  Notes:                     Records from Dr. Wendi Snipes reviewed. Cr was 0.79 on 01/29/17   PROCEDURES:          Urinalysis Dipstick Dipstick Cont'd  Color: Yellow Bilirubin: Neg  Appearance: Clear Ketones: Neg  Specific Gravity: 1.025 Blood: Neg  pH: 5.5 Protein: Neg  Glucose: Neg Urobilinogen: 0.2    Nitrites: Neg    Leukocyte Esterase: Neg    ASSESSMENT:      ICD-10 Details  1 GU:   Elevated PSA - R97.20 He has a very high PSA consistent with metastatic prostate cancer and his prostate is large, asymmetric and hard. I am going to go ahead and do a metastatic w/u and will have him set up to have a prostate [place] and biopsy and bilateral scrotal orchiectomy as an outpatient. I reviewed the risks of bleeding, infection, voiding difficulty, thrombotic events and anesthetic complications. I did discuss the alternative to orchiectomy. He will probably need abiraterone as well.  2   Prostate Cancer - C61   3   Prostate nodule w/ LUTS - N40.3 He is voiding well on  tamsulosin.   4   Post-void dribbling - N39.43    PLAN:           Orders         Schedule X-Rays: 1 Week - C.T. Pelvis With I.V. Contrast - ASAP at [facility]. PSA is 974    1 Week - Bone Scan - ASAP PSA is 974.  Return Visit/Planned Activity: ASAP - Schedule Surgery

## 2017-03-05 ENCOUNTER — Ambulatory Visit (HOSPITAL_COMMUNITY)
Admission: RE | Admit: 2017-03-05 | Discharge: 2017-03-05 | Disposition: A | Discharge status: Home / Self Care | Payer: Medicare Other | Source: Ambulatory Visit | Attending: Urology | Admitting: Urology

## 2017-03-05 ENCOUNTER — Encounter (HOSPITAL_BASED_OUTPATIENT_CLINIC_OR_DEPARTMENT_OTHER)
Admission: RE | Disposition: A | Discharge status: Home / Self Care | Payer: Self-pay | Source: Ambulatory Visit | Attending: Urology

## 2017-03-05 ENCOUNTER — Ambulatory Visit (HOSPITAL_BASED_OUTPATIENT_CLINIC_OR_DEPARTMENT_OTHER): Payer: Medicare Other | Admitting: Anesthesiology

## 2017-03-05 ENCOUNTER — Encounter (HOSPITAL_BASED_OUTPATIENT_CLINIC_OR_DEPARTMENT_OTHER): Payer: Self-pay | Admitting: *Deleted

## 2017-03-05 ENCOUNTER — Ambulatory Visit (HOSPITAL_BASED_OUTPATIENT_CLINIC_OR_DEPARTMENT_OTHER)
Admission: RE | Admit: 2017-03-05 | Discharge: 2017-03-05 | Disposition: A | Discharge status: Home / Self Care | Payer: Medicare Other | Source: Ambulatory Visit | Attending: Urology | Admitting: Urology

## 2017-03-05 DIAGNOSIS — K9162 Intraoperative hemorrhage and hematoma of a digestive system organ or structure complicating other procedure: Secondary | ICD-10-CM | POA: Diagnosis not present

## 2017-03-05 DIAGNOSIS — Z9049 Acquired absence of other specified parts of digestive tract: Secondary | ICD-10-CM | POA: Insufficient documentation

## 2017-03-05 DIAGNOSIS — Z79899 Other long term (current) drug therapy: Secondary | ICD-10-CM | POA: Diagnosis not present

## 2017-03-05 DIAGNOSIS — N403 Nodular prostate with lower urinary tract symptoms: Secondary | ICD-10-CM | POA: Insufficient documentation

## 2017-03-05 DIAGNOSIS — N4611 Organic oligospermia: Secondary | ICD-10-CM | POA: Insufficient documentation

## 2017-03-05 DIAGNOSIS — I10 Essential (primary) hypertension: Secondary | ICD-10-CM | POA: Insufficient documentation

## 2017-03-05 DIAGNOSIS — R590 Localized enlarged lymph nodes: Secondary | ICD-10-CM | POA: Insufficient documentation

## 2017-03-05 DIAGNOSIS — G40909 Epilepsy, unspecified, not intractable, without status epilepticus: Secondary | ICD-10-CM | POA: Diagnosis not present

## 2017-03-05 DIAGNOSIS — K625 Hemorrhage of anus and rectum: Secondary | ICD-10-CM | POA: Diagnosis not present

## 2017-03-05 DIAGNOSIS — Z8673 Personal history of transient ischemic attack (TIA), and cerebral infarction without residual deficits: Secondary | ICD-10-CM | POA: Diagnosis not present

## 2017-03-05 DIAGNOSIS — Z7982 Long term (current) use of aspirin: Secondary | ICD-10-CM | POA: Diagnosis not present

## 2017-03-05 DIAGNOSIS — N3943 Post-void dribbling: Secondary | ICD-10-CM | POA: Diagnosis not present

## 2017-03-05 DIAGNOSIS — C61 Malignant neoplasm of prostate: Secondary | ICD-10-CM | POA: Diagnosis present

## 2017-03-05 DIAGNOSIS — K219 Gastro-esophageal reflux disease without esophagitis: Secondary | ICD-10-CM | POA: Insufficient documentation

## 2017-03-05 DIAGNOSIS — Y838 Other surgical procedures as the cause of abnormal reaction of the patient, or of later complication, without mention of misadventure at the time of the procedure: Secondary | ICD-10-CM | POA: Diagnosis not present

## 2017-03-05 HISTORY — DX: Personal history of transient ischemic attack (TIA), and cerebral infarction without residual deficits: Z86.73

## 2017-03-05 HISTORY — DX: Esophageal obstruction: K22.2

## 2017-03-05 HISTORY — DX: Personal history of other diseases of the digestive system: Z87.19

## 2017-03-05 HISTORY — DX: Other specified health status: Z78.9

## 2017-03-05 HISTORY — PX: PROSTATE BIOPSY: SHX241

## 2017-03-05 HISTORY — DX: Aneurysm of iliac artery: I72.3

## 2017-03-05 HISTORY — DX: Left anterior fascicular block: I44.4

## 2017-03-05 HISTORY — DX: Benign prostatic hyperplasia with lower urinary tract symptoms: N40.1

## 2017-03-05 HISTORY — DX: Diaphragmatic hernia without obstruction or gangrene: K44.9

## 2017-03-05 HISTORY — PX: ORCHIECTOMY: SHX2116

## 2017-03-05 HISTORY — DX: Unspecified convulsions: R56.9

## 2017-03-05 HISTORY — DX: Gastro-esophageal reflux disease without esophagitis: K21.9

## 2017-03-05 HISTORY — DX: Malignant neoplasm of prostate: C61

## 2017-03-05 HISTORY — DX: Alcohol abuse, in remission: F10.11

## 2017-03-05 HISTORY — DX: Unspecified osteoarthritis, unspecified site: M19.90

## 2017-03-05 HISTORY — DX: Mild cognitive impairment of uncertain or unknown etiology: G31.84

## 2017-03-05 LAB — POCT I-STAT, CHEM 8
BUN: 13 mg/dL (ref 6–20)
CALCIUM ION: 1.16 mmol/L (ref 1.15–1.40)
Chloride: 98 mmol/L — ABNORMAL LOW (ref 101–111)
Creatinine, Ser: 0.8 mg/dL (ref 0.61–1.24)
Glucose, Bld: 90 mg/dL (ref 65–99)
HCT: 40 % (ref 39.0–52.0)
Hemoglobin: 13.6 g/dL (ref 13.0–17.0)
Potassium: 4.1 mmol/L (ref 3.5–5.1)
SODIUM: 139 mmol/L (ref 135–145)
TCO2: 31 mmol/L (ref 0–100)

## 2017-03-05 SURGERY — BIOPSY, PROSTATE, RECTAL APPROACH, WITH US GUIDANCE
Anesthesia: General | Site: Scrotum

## 2017-03-05 MED ORDER — OXYCODONE HCL 5 MG/5ML PO SOLN
5.0000 mg | Freq: Once | ORAL | Status: DC | PRN
  Filled 2017-03-05: qty 5

## 2017-03-05 MED ORDER — GENTAMICIN SULFATE 40 MG/ML IJ SOLN
5.0000 mg/kg | INTRAVENOUS | Status: DC
  Filled 2017-03-05: qty 8.5

## 2017-03-05 MED ORDER — ONDANSETRON HCL 4 MG/2ML IJ SOLN
INTRAMUSCULAR | Status: DC | PRN
  Administered 2017-03-05: 4 mg via INTRAVENOUS

## 2017-03-05 MED ORDER — PROPOFOL 10 MG/ML IV BOLUS
INTRAVENOUS | Status: AC
  Filled 2017-03-05: qty 20

## 2017-03-05 MED ORDER — CEFTRIAXONE SODIUM 2 G IJ SOLR
INTRAMUSCULAR | Status: AC
  Filled 2017-03-05: qty 2

## 2017-03-05 MED ORDER — SODIUM CHLORIDE 0.9 % IV SOLN
INTRAVENOUS | Status: DC
  Administered 2017-03-05: 08:00:00 via INTRAVENOUS
  Filled 2017-03-05: qty 1000

## 2017-03-05 MED ORDER — FENTANYL CITRATE (PF) 100 MCG/2ML IJ SOLN
INTRAMUSCULAR | Status: AC
  Filled 2017-03-05: qty 2

## 2017-03-05 MED ORDER — DEXAMETHASONE SODIUM PHOSPHATE 10 MG/ML IJ SOLN
INTRAMUSCULAR | Status: AC
  Filled 2017-03-05: qty 1

## 2017-03-05 MED ORDER — HYDROMORPHONE HCL 1 MG/ML IJ SOLN
0.2500 mg | INTRAMUSCULAR | Status: DC | PRN
  Filled 2017-03-05: qty 0.5

## 2017-03-05 MED ORDER — FENTANYL CITRATE (PF) 100 MCG/2ML IJ SOLN
INTRAMUSCULAR | Status: DC | PRN
  Administered 2017-03-05 (×4): 25 ug via INTRAVENOUS

## 2017-03-05 MED ORDER — DEXAMETHASONE SODIUM PHOSPHATE 4 MG/ML IJ SOLN
INTRAMUSCULAR | Status: DC | PRN
  Administered 2017-03-05: 10 mg via INTRAVENOUS

## 2017-03-05 MED ORDER — DEXTROSE 5 % IV SOLN
2.0000 g | INTRAVENOUS | Status: AC
  Administered 2017-03-05: 2 g via INTRAVENOUS
  Filled 2017-03-05: qty 2

## 2017-03-05 MED ORDER — BUPIVACAINE HCL (PF) 0.25 % IJ SOLN
INTRAMUSCULAR | Status: DC | PRN
  Administered 2017-03-05: 16 mL

## 2017-03-05 MED ORDER — EPHEDRINE 5 MG/ML INJ
INTRAVENOUS | Status: AC
  Filled 2017-03-05: qty 10

## 2017-03-05 MED ORDER — LACTATED RINGERS IV SOLN
INTRAVENOUS | Status: DC
  Administered 2017-03-05 (×2): via INTRAVENOUS
  Filled 2017-03-05: qty 1000

## 2017-03-05 MED ORDER — EPHEDRINE SULFATE 50 MG/ML IJ SOLN
INTRAMUSCULAR | Status: DC | PRN
  Administered 2017-03-05 (×5): 10 mg via INTRAVENOUS

## 2017-03-05 MED ORDER — LIDOCAINE 2% (20 MG/ML) 5 ML SYRINGE
INTRAMUSCULAR | Status: DC | PRN
  Administered 2017-03-05: 60 mg via INTRAVENOUS

## 2017-03-05 MED ORDER — FLEET ENEMA 7-19 GM/118ML RE ENEM
1.0000 | ENEMA | Freq: Once | RECTAL | Status: AC
  Administered 2017-03-05: 1 via RECTAL
  Filled 2017-03-05: qty 1

## 2017-03-05 MED ORDER — DEXTROSE 5 % IV SOLN
INTRAVENOUS | Status: AC
  Filled 2017-03-05: qty 50

## 2017-03-05 MED ORDER — OXYCODONE HCL 5 MG PO TABS
5.0000 mg | ORAL_TABLET | Freq: Once | ORAL | Status: DC | PRN
  Filled 2017-03-05: qty 1

## 2017-03-05 MED ORDER — PROMETHAZINE HCL 25 MG/ML IJ SOLN
6.2500 mg | INTRAMUSCULAR | Status: DC | PRN
  Filled 2017-03-05: qty 1

## 2017-03-05 MED ORDER — GENTAMICIN SULFATE 40 MG/ML IJ SOLN
5.0000 mg/kg | INTRAVENOUS | Status: AC
  Administered 2017-03-05: 350 mg via INTRAVENOUS
  Filled 2017-03-05: qty 8.75

## 2017-03-05 MED ORDER — PHENYLEPHRINE HCL 10 MG/ML IJ SOLN
INTRAMUSCULAR | Status: DC | PRN
  Administered 2017-03-05 (×3): 40 ug via INTRAVENOUS

## 2017-03-05 MED ORDER — ONDANSETRON HCL 4 MG/2ML IJ SOLN
INTRAMUSCULAR | Status: AC
  Filled 2017-03-05: qty 2

## 2017-03-05 MED ORDER — NEOSTIGMINE METHYLSULFATE 5 MG/5ML IV SOSY
PREFILLED_SYRINGE | INTRAVENOUS | Status: AC
Start: 2017-03-05 — End: ?
  Filled 2017-03-05: qty 5

## 2017-03-05 MED ORDER — PROPOFOL 10 MG/ML IV BOLUS
INTRAVENOUS | Status: DC | PRN
  Administered 2017-03-05: 160 mg via INTRAVENOUS
  Administered 2017-03-05: 40 mg via INTRAVENOUS

## 2017-03-05 MED ORDER — HYDROCODONE-ACETAMINOPHEN 5-325 MG PO TABS: 1.0000 | ORAL_TABLET | Freq: Four times a day (QID) | ORAL | 0 refills | Status: DC | PRN

## 2017-03-05 MED ORDER — LIDOCAINE 2% (20 MG/ML) 5 ML SYRINGE
INTRAMUSCULAR | Status: AC
  Filled 2017-03-05: qty 5

## 2017-03-05 SURGICAL SUPPLY — 66 items
APPLICATOR COTTON TIP 6IN STRL (MISCELLANEOUS) IMPLANT
BLADE CLIPPER SURG (BLADE) ×4 IMPLANT
BLADE SURG 15 STRL LF DISP TIS (BLADE) ×2 IMPLANT
BLADE SURG 15 STRL SS (BLADE) ×2
BNDG GAUZE ELAST 4 BULKY (GAUZE/BANDAGES/DRESSINGS) ×4 IMPLANT
CANISTER SUCTION 1200CC (MISCELLANEOUS) ×4 IMPLANT
CLEANER CAUTERY TIP 5X5 PAD (MISCELLANEOUS) ×2 IMPLANT
COVER BACK TABLE 60X90IN (DRAPES) ×4 IMPLANT
COVER MAYO STAND STRL (DRAPES) ×4 IMPLANT
DERMABOND ADVANCED (GAUZE/BANDAGES/DRESSINGS) ×2
DERMABOND ADVANCED .7 DNX12 (GAUZE/BANDAGES/DRESSINGS) ×2 IMPLANT
DISSECTOR ROUND CHERRY 3/8 STR (MISCELLANEOUS) IMPLANT
DRAIN PENROSE 18X1/4 LTX STRL (WOUND CARE) IMPLANT
DRAPE LAPAROTOMY TRNSV 102X78 (DRAPE) ×4 IMPLANT
DRSG TEGADERM 4X4.75 (GAUZE/BANDAGES/DRESSINGS) IMPLANT
ELECT NEEDLE TIP 2.8 STRL (NEEDLE) IMPLANT
ELECT REM PT RETURN 9FT ADLT (ELECTROSURGICAL) ×4
ELECTRODE REM PT RTRN 9FT ADLT (ELECTROSURGICAL) ×2 IMPLANT
GAUZE SPONGE 4X4 8PLY STR LF (GAUZE/BANDAGES/DRESSINGS) ×4 IMPLANT
GLOVE SURG SS PI 8.0 STRL IVOR (GLOVE) ×4 IMPLANT
GOWN STRL REUS W/ TWL LRG LVL3 (GOWN DISPOSABLE) ×2 IMPLANT
GOWN STRL REUS W/ TWL XL LVL3 (GOWN DISPOSABLE) ×2 IMPLANT
GOWN STRL REUS W/TWL LRG LVL3 (GOWN DISPOSABLE) ×2
GOWN STRL REUS W/TWL XL LVL3 (GOWN DISPOSABLE) ×2
INST BIOPSY MAXCORE 18GX25 (NEEDLE) IMPLANT
INSTR BIOPSY MAXCORE 18GX20 (NEEDLE) IMPLANT
KIT RM TURNOVER CYSTO AR (KITS) ×4 IMPLANT
MANIFOLD NEPTUNE II (INSTRUMENTS) IMPLANT
NDL SAFETY ECLIPSE 18X1.5 (NEEDLE) IMPLANT
NEEDLE HYPO 18GX1.5 SHARP (NEEDLE)
NEEDLE HYPO 22GX1.5 SAFETY (NEEDLE) ×4 IMPLANT
NEEDLE SPNL 22GX7 QUINCKE BK (NEEDLE) IMPLANT
NS IRRIG 500ML POUR BTL (IV SOLUTION) IMPLANT
PACK BASIN DAY SURGERY FS (CUSTOM PROCEDURE TRAY) ×4 IMPLANT
PAD CLEANER CAUTERY TIP 5X5 (MISCELLANEOUS) ×2
PENCIL BUTTON HOLSTER BLD 10FT (ELECTRODE) ×4 IMPLANT
SET COLLECT BLD 21X3/4 12 (NEEDLE) IMPLANT
SPONGE GAUZE 4X4 12PLY (GAUZE/BANDAGES/DRESSINGS) IMPLANT
SPONGE HEMORRHOID 8X3CM (HEMOSTASIS) ×4 IMPLANT
SUPPORT SCROTAL LG STRP (MISCELLANEOUS) IMPLANT
SUPPORTER ATHLETIC LG (MISCELLANEOUS)
SURGILUBE 2OZ TUBE FLIPTOP (MISCELLANEOUS) ×4 IMPLANT
SUT CHROMIC 3 0 SH 27 (SUTURE) ×4 IMPLANT
SUT CHROMIC GUT AB #0 18 (SUTURE) IMPLANT
SUT MNCRL AB 4-0 PS2 18 (SUTURE) ×4 IMPLANT
SUT PROLENE 2 0 CT2 30 (SUTURE) IMPLANT
SUT SILK 3 0 TIES 17X18 (SUTURE)
SUT SILK 3-0 18XBRD TIE BLK (SUTURE) IMPLANT
SUT VIC AB 3-0 SH 27 (SUTURE) ×2
SUT VIC AB 3-0 SH 27X BRD (SUTURE) ×2 IMPLANT
SUT VIC AB 4-0 P-3 18XBRD (SUTURE) IMPLANT
SUT VIC AB 4-0 P3 18 (SUTURE)
SUT VICRYL 0 TIES 12 18 (SUTURE) ×4 IMPLANT
SUT VICRYL 2 0 18  UND BR (SUTURE)
SUT VICRYL 2 0 18 UND BR (SUTURE) IMPLANT
SUT VICRYL 4-0 PS2 18IN ABS (SUTURE) IMPLANT
SYR 20CC LL (SYRINGE) ×4 IMPLANT
SYR BULB IRRIGATION 50ML (SYRINGE) ×4 IMPLANT
SYR CONTROL 10ML LL (SYRINGE) ×4 IMPLANT
TOWEL OR 17X24 6PK STRL BLUE (TOWEL DISPOSABLE) ×8 IMPLANT
TRAY DSU PREP LF (CUSTOM PROCEDURE TRAY) ×4 IMPLANT
TUBE CONNECTING 12'X1/4 (SUCTIONS) ×1
TUBE CONNECTING 12X1/4 (SUCTIONS) ×3 IMPLANT
UNDERPAD 30X30 INCONTINENT (UNDERPADS AND DIAPERS) ×4 IMPLANT
WATER STERILE IRR 500ML POUR (IV SOLUTION) IMPLANT
YANKAUER SUCT BULB TIP NO VENT (SUCTIONS) ×4 IMPLANT

## 2017-03-05 NOTE — Transfer of Care (Signed)
Immediate Anesthesia Transfer of Care Note  Patient: Joshua Mckee  Procedure(s) Performed: Procedure(s) (LRB): BIOPSY TRANSRECTAL ULTRASONIC PROSTATE (TUBP) (N/A) ORCHIECTOMY BILATERAL (Bilateral)  Patient Location: PACU  Anesthesia Type: General  Level of Consciousness: awake, sedated, patient cooperative and responds to stimulation  Airway & Oxygen Therapy: Patient Spontanous Breathing and Patient connected to [place] O2  Post-op Assessment: Report given to PACU RN, Post -op Vital signs reviewed and stable and Patient moving all extremities  Post vital signs: Reviewed and stable  Complications: No apparent anesthesia complications

## 2017-03-05 NOTE — Anesthesia Postprocedure Evaluation (Signed)
Anesthesia Post Note  Patient: Alekai Pocock  Procedure(s) Performed: Procedure(s) (LRB): BIOPSY TRANSRECTAL ULTRASONIC PROSTATE (TUBP) (N/A) ORCHIECTOMY BILATERAL (Bilateral)     Patient location during evaluation: PACU Anesthesia Type: General Level of consciousness: awake and alert Pain management: pain level controlled Vital Signs Assessment: post-procedure vital signs reviewed and stable Respiratory status: spontaneous breathing, nonlabored ventilation and respiratory function stable Cardiovascular status: blood pressure returned to baseline and stable Postop Assessment: no signs of nausea or vomiting Anesthetic complications: no    Last Vitals:  Vitals:   03/05/17 0732 03/05/17 1129  BP: (!) 132/96   Pulse: 83 70  Resp: 16   Temp: 36.9 C     Last Pain:  Vitals:   03/05/17 0732  TempSrc: Oral                 Lynda Rainwater

## 2017-03-05 NOTE — Brief Op Note (Signed)
03/05/2017  11:16 AM  PATIENT:  Joshua Mckee  76 y.o. male  PRE-OPERATIVE DIAGNOSIS:  Metastatic prostate cancer  POST-OPERATIVE DIAGNOSIS: Same  PROCEDURE:  Procedure(s): BIOPSY TRANSRECTAL ULTRASONIC PROSTATE (TUBP) (N/A) ORCHIECTOMY BILATERAL (Bilateral)  SURGEON:  Surgeon(s) and Role:    Irine Seal, MD - Primary  PHYSICIAN ASSISTANT:   ASSISTANTS: none   ANESTHESIA:   general  EBL:  Total I/O In: 1000 [I.V.:1000] Out: 50 [Blood:50]  BLOOD ADMINISTERED:none  DRAINS: none   LOCAL MEDICATIONS USED:  MARCAINE    and Amount: 12 ml 0.25%  SPECIMEN:  Source of Specimen:  prostate biopsies and testes  DISPOSITION OF SPECIMEN:  PATHOLOGY  COUNTS:  YES  TOURNIQUET:  * No tourniquets in log *  DICTATION: .Other Dictation: Dictation Number [id number]  PLAN OF CARE: Discharge to home after PACU  PATIENT DISPOSITION:  PACU - hemodynamically stable.   Delay start of Pharmacological VTE agent (>24hrs) due to surgical blood loss or risk of bleeding: not applicable

## 2017-03-05 NOTE — Anesthesia Procedure Notes (Addendum)
Procedure Name: LMA Insertion Date/Time: 03/05/2017 10:16 AM Performed by: Wanita Chamberlain Pre-anesthesia Checklist: Patient identified, Timeout performed, Emergency Drugs available, Suction available and Patient being monitored Patient Re-evaluated:Patient Re-evaluated prior to inductionOxygen Delivery Method: Circle system utilized Preoxygenation: Pre-oxygenation with 100% oxygen Intubation Type: IV induction Ventilation: Mask ventilation without difficulty LMA: LMA inserted LMA Size: 4.0 Number of attempts: 1 Placement Confirmation: positive ETCO2 and breath sounds checked- equal and bilateral Comments: Noted upper lip with skin tear - lube applied  Shirlean Mylar, S CRNA / Dr Sabra Heck inserted LMA

## 2017-03-05 NOTE — Anesthesia Preprocedure Evaluation (Signed)
Anesthesia Evaluation  Patient identified by MRN, date of birth, ID band Patient awake    Reviewed: Allergy & Precautions, NPO status , Patient's Chart, lab work & pertinent test results  Airway Mallampati: II  TM Distance: >3 FB Neck ROM: Full    Dental no notable dental hx.    Pulmonary neg pulmonary ROS,    Pulmonary exam normal breath sounds clear to auscultation       Cardiovascular hypertension, negative cardio ROS Normal cardiovascular exam Rhythm:Regular Rate:Normal     Neuro/Psych Seizures -,  negative neurological ROS  negative psych ROS   GI/Hepatic negative GI ROS, Neg liver ROS, GERD  ,  Endo/Other  negative endocrine ROS  Renal/GU negative Renal ROS  negative genitourinary   Musculoskeletal negative musculoskeletal ROS (+) Arthritis ,   Abdominal   Peds negative pediatric ROS (+)  Hematology negative hematology ROS (+)   Anesthesia Other Findings   Reproductive/Obstetrics negative OB ROS                             Anesthesia Physical Anesthesia Plan  ASA: III  Anesthesia Plan: General   Post-op Pain Management:    Induction: Intravenous  PONV Risk Score and Plan: 4 or greater and Ondansetron, Propofol, Midazolam and Treatment may vary due to age or medical condition  Airway Management Planned:   Additional Equipment:   Intra-op Plan:   Post-operative Plan: Extubation in OR  Informed Consent: I have reviewed the patients History and Physical, chart, labs and discussed the procedure including the risks, benefits and alternatives for the proposed anesthesia with the patient or authorized representative who has indicated his/her understanding and acceptance.   Dental advisory given  Plan Discussed with: CRNA  Anesthesia Plan Comments:         Anesthesia Quick Evaluation

## 2017-03-05 NOTE — Interval H&P Note (Signed)
History and Physical Interval Note:  CT showed pelvic adenopathy consistent with mets and prostate enhancement.   Proceed as planned.   03/05/2017 9:35 AM  Joshua Mckee  has presented today for surgery, with the diagnosis of PROAATATE CANCER  The various methods of treatment have been discussed with the patient and family. After consideration of risks, benefits and other options for treatment, the patient has consented to  Procedure(s): BIOPSY TRANSRECTAL ULTRASONIC PROSTATE (TUBP) (N/A) ORCHIECTOMY BILATERAL (Bilateral) as a surgical intervention .  The patient's history has been reviewed, patient examined, no change in status, stable for surgery.  I have reviewed the patient's chart and labs.  Questions were answered to the patient's satisfaction.     Joshua Mckee

## 2017-03-05 NOTE — Discharge Instructions (Addendum)
Orchiectomy, Care After This sheet gives you information about how to care for yourself after your procedure. Your health care provider may also give you more specific instructions. If you have problems or questions, contact your health care provider. What can I expect after the procedure? After the procedure, it is common to have:  Pain.  Bruising.  Blood pooling (hematoma) in the area where your testicles were removed.  Depression or mood changes.  Fatigue.  Hot flashes.  Follow these instructions at home: Managing pain and swelling  If directed, put ice on the affected area: ? Put ice in a plastic bag. ? Place a towel between your skin and the bag. ? Leave the ice on for 20 minutes, 2-3 times a day.  Wear scrotal support as told by your health care provider.  To relieve pressure and pain when sitting, you may use a donut cushion if directed by your health care provider. Incision care  Follow instructions from your health care provider about how to take care of your incision. Make sure you: ? Wash your hands with soap and water before you change your bandage (dressing). If soap and water are not available, use hand sanitizer. ? Change your dressing once a day, or as often as told by your health care provider. If the dressing sticks to your incision area: ? Use warm, soapy water or hydrogen peroxide to dampen the dressing. ? When the dressing becomes loose, lift it from the incision area. Make sure that the incision stays closed. ? Leave stitches (sutures), skin glue, or adhesive strips in place. These skin closures may need to stay in place for 2 weeks or longer. If adhesive strip edges start to loosen and curl up, you may trim the loose edges. Do not remove adhesive strips completely unless your health care provider tells you to do that.  Keep your dressing dry until it has been removed.  Check your incision area every day for signs of infection. Check for: ? More redness,  swelling, or pain. ? More fluid or blood. ? Warmth. ? Pus or a bad smell. Bathing  Do not take baths, swim, or use a hot tub until your health care provider approves. You may start taking showers two days after your procedure.  Do not rub your incision to dry it. Pat the area gently with a clean cloth or let it air-dry. Medicines  Take over-the-counter and prescription medicines only as told by your health care provider.  If you were prescribed an antibiotic medicine, use it as told by your health care provider. Do not stop using the antibiotic even if you start to feel better.  If you had both testicles removed, talk with your health care provider about medicine supplements to replace one of the male hormones (testosterone) that your body will no longer make. Driving  Do not drive for 24 hours if you were given a medicine to help you relax (sedative).  Do not drive or use heavy machinery while taking prescription pain medicines. Activity  Avoid activities that may cause your incision to open, such as jogging, playing sports, and straining with bowel movements. Ask your health care provider what activities are safe for you.  Do not lift anything that is heavier than 10 lb (4.5 kg), or the limit that your health care provider tells you, until he or she says that it is safe.  Do not engage in sexual activity until the area is healed and your health care provider approves.  This could take up to 4 weeks. General instructions  To prevent or treat constipation while you are taking prescription pain medicine, your health care provider may recommend that you: ? Drink enough fluid to keep your urine clear or pale yellow. ? Take over-the-counter or prescription medicines. ? Eat foods that are high in fiber, such as fresh fruits and vegetables, whole grains, and beans. ? Limit foods that are high in fat and processed sugars, such as fried and sweet foods.  Do not use any products that  contain nicotine or tobacco, such as cigarettes and e-cigarettes. If you need help quitting, ask your health care provider.  Keep all follow-up visits as told by your health care provider. This is important. Contact a health care provider if:  You have more pain, swelling, or redness in your genital or groin area.  You have more fluid or blood coming from your incision.  Your incision feels warm to the touch.  You have pus or a bad smell coming from your incision.  You have constipation that is not helped by changing your diet or drinking more fluid.  You develop nausea or vomiting.  You cannot eat or drink without vomiting. Get help right away if:  You have dizziness or nausea that does not go away.  You have trouble breathing.  You have a wet (congested) cough.  You have a fever or shaking chills.  Your incision breaks open after the skin closures have been removed.  You are not able to urinate. Summary  After this procedure, it is most common to have bruising or blood pooling in the area where the testicles were removed.  You should check your incision area every day for signs of infection, such as redness, swelling, pain, fluid, blood, warmth, pus, or a bad smell.  Avoid activities that may cause your incision to open, such as jogging, playing sports, and straining with bowel movements. Ask your health care provider what activities are safe for you.  You should not engage in sexual activity until the area is healed and your health care provider approves. This could take up to 4 weeks.  Men who have both testicles removed may have emotional and physical side effects. Your health care provider can help you with ways to manage those side effects. This information is not intended to replace advice given to you by your health care provider. Make sure you discuss any questions you have with your health care provider. Document Released: 05/06/2013 Document Revised: 07/26/2016  Document Reviewed: 07/26/2016 Elsevier Interactive Patient Education  2017 [facility] Instructions  Activity: Get plenty of rest for the remainder of the day. A responsible individual must stay with you for 24 hours following the procedure.  For the next 24 hours, DO NOT: -Drive a car -Paediatric nurse -Drink alcoholic beverages -Take any medication unless instructed by your physician -Make any legal decisions or sign important papers.  Meals: Start with liquid foods such as gelatin or soup. Progress to regular foods as tolerated. Avoid greasy, spicy, heavy foods. If nausea and/or vomiting occur, drink only clear liquids until the nausea and/or vomiting subsides. Call your physician if vomiting continues.  Special Instructions/Symptoms: Your throat may feel dry or sore from the anesthesia or the breathing tube placed in your throat during surgery. If this causes discomfort, gargle with warm salt water. The discomfort should disappear within 24 hours.  If you had a scopolamine patch placed behind your ear for  the management of post- operative nausea and/or vomiting: ° °1. The medication in the patch is effective for 72 hours, after which it should be removed.  Wrap patch in a tissue and discard in the trash. Wash hands thoroughly with soap and water. °2. You may remove the patch earlier than 72 hours if you experience unpleasant side effects which may include dry mouth, dizziness or visual disturbances. °3. Avoid touching the patch. Wash your hands with soap and water after contact with the patch. °  ° ° °

## 2017-03-06 ENCOUNTER — Encounter (HOSPITAL_BASED_OUTPATIENT_CLINIC_OR_DEPARTMENT_OTHER): Payer: Self-pay | Admitting: Urology

## 2017-03-06 ENCOUNTER — Ambulatory Visit (INDEPENDENT_AMBULATORY_CARE_PROVIDER_SITE_OTHER): Payer: Medicare Other | Admitting: Family Medicine

## 2017-03-06 VITALS — BP 110/78 | HR 84 | Temp 98.6°F | Resp 18 | Ht 65.0 in | Wt 155.0 lb

## 2017-03-06 DIAGNOSIS — K2100 Gastro-esophageal reflux disease with esophagitis, without bleeding: Secondary | ICD-10-CM

## 2017-03-06 DIAGNOSIS — R569 Unspecified convulsions: Secondary | ICD-10-CM | POA: Diagnosis not present

## 2017-03-06 DIAGNOSIS — F1010 Alcohol abuse, uncomplicated: Secondary | ICD-10-CM

## 2017-03-06 DIAGNOSIS — C61 Malignant neoplasm of prostate: Secondary | ICD-10-CM | POA: Insufficient documentation

## 2017-03-06 DIAGNOSIS — Z8673 Personal history of transient ischemic attack (TIA), and cerebral infarction without residual deficits: Secondary | ICD-10-CM

## 2017-03-06 DIAGNOSIS — E785 Hyperlipidemia, unspecified: Secondary | ICD-10-CM | POA: Diagnosis not present

## 2017-03-06 NOTE — Patient Instructions (Signed)
Continue to eat well and stay active  See me in 3 months  May start driving July 1st  No change in medicine  Call for problems

## 2017-03-06 NOTE — Progress Notes (Signed)
Chief Complaint  Patient presents with  . Follow-up    3 month  He has seen another primary care doctor since last here, with the main complaint of urinary symptoms. He states he went there because he did not remember that he had a PCP.  This doctor fortunately did a PSA which identified prostate cancer.  He is under the care of Dr Jeffie Pollock in urology and yesterday had prostate biopsies and an orchiectomy.  He feels well today.  No pain.  Good spirits.   He denies any complaints today.  Is here with his sig other [name] and his youngest daughter.  They are eager for him to resume driving.  He had a seizure in January and was placed on keppra.  He is compliant with medicines and has not had another seizure.  He was told by neurologist he could drive when seizure free for 6 months.  We discussed driving.  He should only drive during the day.  He should only drive with another person in the car.  He should only drive locally in [place]. Blood pressure is good Recent labs from May are reviewed His bone scan showed no bone mets His CT showed prostate and lymph node involvement of the cancer. he is no longer drinking alcohol He has not had any heartburn or trouble swallowing  Patient Active Problem List   Diagnosis Date Noted  . Prostate cancer ([place]) 03/06/2017  . Mild cognitive impairment with memory loss 11/30/2016  . Hyperlipidemia 11/30/2016  . Seizure ([place]) 10/15/2016  . Acute ischemic stroke ([place]) 08/27/2016  . Hypertension 08/27/2016  . Esophageal ulcer 02/27/2016  . GI bleed 12/15/2015  . ETOH abuse 12/14/2015  . Reflux esophagitis   . Hiatal hernia     Outpatient Encounter Prescriptions as of 03/06/2017  Medication Sig  . aspirin EC 81 MG EC tablet Take 1 tablet (81 mg total) by mouth daily.  Marland Kitchen atorvastatin (LIPITOR) 40 MG tablet Take 1 tablet (40 mg total) by mouth daily at 6 PM. (Patient taking differently: Take 40 mg by mouth every morning. )  . chlorproMAZINE (THORAZINE) 25  MG tablet Take 1 tablet (25 mg total) by mouth 2 (two) times daily as needed for hiccoughs.  Marland Kitchen HYDROcodone-acetaminophen (NORCO) 5-325 MG tablet Take 1 tablet by mouth every 6 (six) hours as needed for moderate pain.  Marland Kitchen levETIRAcetam (KEPPRA) 500 MG tablet Take 1 tablet (500 mg total) by mouth 2 (two) times daily.  . ondansetron (ZOFRAN ODT) 4 MG disintegrating tablet Take 1 tablet (4 mg total) by mouth every 8 (eight) hours as needed for nausea or vomiting.  . pantoprazole (PROTONIX) 40 MG tablet Take 1 tablet (40 mg total) by mouth daily. (Patient taking differently: Take 40 mg by mouth every morning. )  . tamsulosin (FLOMAX) 0.4 MG CAPS capsule Take 1 capsule (0.4 mg total) by mouth daily. (Patient taking differently: Take 0.4 mg by mouth every morning. )   No facility-administered encounter medications on file as of 03/06/2017.     No Known Allergies  Review of Systems  Constitutional: Negative for activity change, appetite change and unexpected weight change.       Weight stable  HENT: Negative for congestion and trouble swallowing.   Eyes: Negative for photophobia and visual disturbance.  Respiratory: Negative for cough and shortness of breath.   Cardiovascular: Negative for chest pain, palpitations and leg swelling.  Gastrointestinal: Negative for blood in stool, constipation and diarrhea.  Genitourinary: Positive for frequency.  Negative for difficulty urinating and hematuria.  Musculoskeletal: Negative.  Negative for arthralgias and back pain.  Neurological: Negative for dizziness, seizures and facial asymmetry.  Psychiatric/Behavioral: Negative for dysphoric mood. The patient is not nervous/anxious.     BP 110/78 (BP Location: Right Arm, Patient Position: Sitting, Cuff Size: Normal)   Pulse 84   Temp 98.6 F (37 C) (Temporal)   Resp 18   Ht 5\' 5"  (1.651 m)   Wt 155 lb (70.3 kg)   SpO2 97%   BMI 25.79 kg/m   Physical Exam  Constitutional: He is oriented to person, place,  and time. He appears well-developed and well-nourished.  Thin.  Cooperative.  smiling  HENT:  Head: Normocephalic and atraumatic.  Mouth/Throat: Oropharynx is clear and moist.  Eyes: Conjunctivae are normal. Pupils are equal, round, and reactive to light.  Neck: Normal range of motion. Neck supple. No thyromegaly present.  Cardiovascular: Normal rate, regular rhythm and normal heart sounds.   Pulmonary/Chest: Effort normal and breath sounds normal. No respiratory distress.  Abdominal: Soft. Bowel sounds are normal.  Musculoskeletal: Normal range of motion. He exhibits no edema.  Lymphadenopathy:    He has no cervical adenopathy.  Neurological: He is alert and oriented to person, place, and time.  Gait normal  Skin: Skin is warm and dry.  Psychiatric: He has a normal mood and affect. His behavior is normal.  Repeats himself  Nursing note and vitals reviewed.   ASSESSMENT/PLAN:  1. Prostate cancer [facility]) Recent diagnosis and biopsy 2. History stroke No residual 3. Reflux esophagitis asymptomatic 4. ETOH abuse quit 5. Hyperlipidemia On lipitor 6 history seizure Six months seizure free - compliant with medicines  Patient Instructions  Continue to eat well and stay active  See me in 3 months  May start driving July 1st  No change in medicine  Call for problems   Raylene Everts, MD

## 2017-03-06 NOTE — Op Note (Signed)
NAME:  Joshua Mckee, Joshua Mckee                    ACCOUNT NO.:  MEDICAL RECORD NO.:  [id number]  LOCATION:                                 FACILITY:  PHYSICIAN:  Marshall Cork. Jeffie Pollock, M.D.    DATE OF BIRTH:  04-06-1941  DATE OF PROCEDURE:  03/05/2017 DATE OF DISCHARGE:                              OPERATIVE REPORT   PROCEDURES: 1. Prostate ultrasound. 2. Ultrasound-guided prostate biopsy. 3. Bilateral scrotal orchiectomy.  PREOPERATIVE DIAGNOSIS:  Metastatic prostate cancer with PSA of 974 and nodular prostate and pelvic adenopathy.  POSTOPERATIVE DIAGNOSIS:  Metastatic prostate cancer with PSA of 974 and nodular prostate and pelvic adenopathy.  SURGEON:  Marshall Cork. Jeffie Pollock, M.D.  ANESTHESIA:  General.  SPECIMENS:  A 12-core prostate biopsy and bilateral testes.  BLOOD LOSS:  50 mL.  DRAINS:  None.  COMPLICATIONS:  None.  INDICATIONS:  Mr. Dayal is a 76 year old African American male who presented with a PSA of 974 and nodular prostate.  CT scan revealed pelvic adenopathy consistent with metastases.  I discussed treatment options and he has elected to undergo prostate biopsy and bilateral orchiectomy.  FINDINGS AND PROCEDURE:  He was taken to the operating room where he was given Rocephin and gentamicin.  A general anesthetic was induced on the holding room stretcher and he was rolled in the left lateral decubitus position.  The prostate ultrasound probe was assembled and inserted and scanning was performed.  Examination revealed some asymmetry of the seminal vesicles, right greater than left.  The prostate was inhomogeneous without discrete hypoechoic areas, but there was asymmetry.  The left prostate was more prominent than the right.  No obvious extracapsular disease was noted.  The prostate volume was 60 cm3 with a length of 5.86 cm, a width of 5.58 cm and a height of 3.51 cm.  Once the diagnostic scan was performed, a 12-core template biopsy was performed in the usual fashion.   There was more resistance to needle passage on the left than the right.  After completion of the biopsy, the patient was noted to have some right red bleeding from the rectum.  A gauze was rolled and applied and observed for 5 minutes.  The bleeding seemed to stop, but did not completely abate.  At this point, anoscopy was performed and approximately 40 mL of clot was aspirated and removed.  Inspection revealed a small area of oozing from the rectal mucosa on the patient's left side with some mucosal hematoma formation.  The bleeding did not appear to be sufficient to require stitch; however, a Surg[place]am anal plug was placed to provide additional hemostasis.  At this point, the patient was moved from the holding room stretcher to the operating stretcher and placed in the supine position.  His scrotum was clipped.  He was prepped with Betadine solution and draped in usual sterile fashion.  An anterior midline scrotal incision was made with a knife.  The right testicle was delivered from the wound using the Bovie.  The cord was divided into two packets, clamped with Kellys and the testicle was removed, approximately 3 mL of 0.25% Marcaine was injected into the cord and the packets  were then tied, doubly ligated with 0 Vicryl ties.  This procedure was then repeated on the left side.  The wound was then closed using a running 3-0 chromic on the dartos with care being taken to pick up the midline septum.  A running vertical mattress 3-0 chromic was then used to close the skin.  The wound was then infiltrated with approximately 6 additional mL of 0.25% Marcaine.  A dressing of 4x4s, fluff, Kerlix and a scrotal support was applied. The anal area was inspected for bleeding and there was no significant bleeding noted.  The anesthetic was reversed.  The patient was removed to the recovery room in stable condition.  There were no complications other than the moderate rectal bleeding,  which was controlled.     Marshall Cork. Jeffie Pollock, M.D.     JJW/MEDQ  D:  03/05/2017  T:  03/05/2017  Job:  [id number]

## 2017-03-22 ENCOUNTER — Ambulatory Visit (INDEPENDENT_AMBULATORY_CARE_PROVIDER_SITE_OTHER): Payer: Self-pay | Admitting: Urology

## 2017-03-22 DIAGNOSIS — C61 Malignant neoplasm of prostate: Secondary | ICD-10-CM

## 2017-03-22 DIAGNOSIS — C775 Secondary and unspecified malignant neoplasm of intrapelvic lymph nodes: Secondary | ICD-10-CM

## 2017-04-04 ENCOUNTER — Ambulatory Visit (HOSPITAL_COMMUNITY): Payer: Medicare Other

## 2017-04-17 ENCOUNTER — Other Ambulatory Visit: Payer: Self-pay | Admitting: Family Medicine

## 2017-04-27 ENCOUNTER — Other Ambulatory Visit: Payer: Self-pay | Admitting: Family Medicine

## 2017-05-02 ENCOUNTER — Ambulatory Visit: Payer: Medicare Other | Admitting: Family Medicine

## 2017-05-20 ENCOUNTER — Other Ambulatory Visit: Payer: Self-pay | Admitting: Family Medicine

## 2017-05-21 NOTE — Telephone Encounter (Signed)
You did refill this the last time, do you want to keep writing this?

## 2017-05-30 ENCOUNTER — Encounter (HOSPITAL_COMMUNITY): Payer: Self-pay | Admitting: *Deleted

## 2017-05-30 ENCOUNTER — Emergency Department (HOSPITAL_COMMUNITY)
Admission: EM | Admit: 2017-05-30 | Discharge: 2017-05-30 | Disposition: A | Discharge status: Home / Self Care | Payer: Medicare Other | Attending: Emergency Medicine | Admitting: Emergency Medicine

## 2017-05-30 DIAGNOSIS — Z7982 Long term (current) use of aspirin: Secondary | ICD-10-CM | POA: Diagnosis not present

## 2017-05-30 DIAGNOSIS — Z8546 Personal history of malignant neoplasm of prostate: Secondary | ICD-10-CM | POA: Diagnosis not present

## 2017-05-30 DIAGNOSIS — K5641 Fecal impaction: Secondary | ICD-10-CM

## 2017-05-30 DIAGNOSIS — K5900 Constipation, unspecified: Secondary | ICD-10-CM | POA: Diagnosis present

## 2017-05-30 DIAGNOSIS — Z79899 Other long term (current) drug therapy: Secondary | ICD-10-CM | POA: Diagnosis not present

## 2017-05-30 DIAGNOSIS — I10 Essential (primary) hypertension: Secondary | ICD-10-CM | POA: Diagnosis not present

## 2017-05-30 MED ORDER — MAGNESIUM CITRATE PO SOLN
1.0000 | Freq: Once | ORAL | Status: AC
  Administered 2017-05-30: 1 via ORAL
  Filled 2017-05-30: qty 296

## 2017-05-30 NOTE — Discharge Instructions (Signed)
Magnesium citrate: rank the entire 10 ounce bottle mixed with equal parts Sprite or Gatorade for relief of constipation.  Return to the emergency department if you develop severe abdominal pain, high fevers, rectal bleeding, or other new and concerning symptoms.

## 2017-05-30 NOTE — ED Triage Notes (Signed)
Pt c/o having pain with trying to have a BM; pt states his last BM was Wednesday morning; pt states when he tries to have a BM is when he has pain and he has no result

## 2017-05-30 NOTE — ED Provider Notes (Signed)
[facility] DEPT Provider Note   CSN: [id number] Arrival date & time: 05/30/17  0410     History   Chief Complaint Chief Complaint  Patient presents with  . Constipation    HPI Joshua Mckee is a 76 y.o. male.  Patient is a 76 year old male ith history of high cholesterol, hypertension presenting with complaints of constipation. Feels as though he has to have a bowel movement, but his "dookie won't come out". He denies any fevers or chills. He denies any bloody stools. His last bowel movement was 5 days ago and was normal. He has tried taking Ex-Lax at home, however this has not helped.   The history is provided by the patient.  Constipation   This is a new problem. Episode onset: 5 days ago. Pertinent negatives include no abdominal pain. He has tried stimulants for the symptoms. The treatment provided no relief.    Past Medical History:  Diagnosis Date  . Aneurysm of right common iliac artery (HCC)    2.2 cm per CT 02-18-2017  . Arthritis   . GERD (gastroesophageal reflux disease)   . Hiatal hernia   . Hiccups    CHRONIC--- MULTIPLE ED VISITS  . History of alcohol abuse   . History of GI bleed    upper GI bleed from esophageal ulcer 01/ 2017 and 03/ 2017 from erosive esophageal gastiris  . 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 History of small bowel obstruction    04-06-2005  s/p  exp. lap. w/ lysis adhesions for partial sbo  . Hyperlipidemia   . Hyperlipidemia   . Hyperplasia of prostate with lower urinary tract symptoms (LUTS)   . Hypertension   . LAFB (left anterior fascicular block)   . Mild cognitive impairment with memory loss    PROBABLE STROKE RESIDUAL BUT POSSIBLE PRIOR TO STROKE  . Partial seizure ([place])    x1  10-15-2016 at ED visit , post cva 08-27-2016  . Poor  historian   . Prostate cancer [facility]) urologist-  dr Jeffie Pollock  . Schatzki's ring     Patient Active Problem List   Diagnosis Date Noted  . Prostate cancer ([place]) 03/06/2017  . Mild cognitive impairment with memory loss 11/30/2016  . Hyperlipidemia 11/30/2016  . Seizure ([address]) 10/15/2016  . History of recent stroke 08/27/2016  . Hypertension 08/27/2016  . Esophageal ulcer 02/27/2016  . GI bleed 12/15/2015  . ETOH abuse 12/14/2015  . Reflux esophagitis   . Hiatal hernia     Past Surgical History:  Procedure Laterality Date  . COLONOSCOPY N/A 03/08/2016   Procedure: COLONOSCOPY;  Surgeon: Daneil Dolin, MD;  [facility];  Service: Endoscopy;  Laterality: N/A;  1300-moved to 1400  . ESOPHAGOGASTRODUODENOSCOPY N/A 10/05/2015   Dr. Gala Romney: erosive reflux esophagitis, non-critcal Schatki's ring non-manipulated  . ESOPHAGOGASTRODUODENOSCOPY N/A 12/15/2015   Dr. Rourk:non bleeding esophageal ulcer/small HH  . ESOPHAGOGASTRODUODENOSCOPY N/A 03/08/2016   Procedure: ESOPHAGOGASTRODUODENOSCOPY (EGD);  Surgeon: Daneil Dolin, MD;  [facility];  Service: Endoscopy;  Laterality: N/A;  . EXPLORATORY LAPAROTOMY W/ ADHESIOLYSIS  04/06/2005   partial SBO  . ORCHIECTOMY Bilateral 03/05/2017   Procedure: ORCHIECTOMY BILATERAL;  Surgeon: Irine Seal, MD;  [facility];  Service: Urology;  Laterality: Bilateral;  . PROSTATE BIOPSY N/A 03/05/2017   Procedure: BIOPSY TRANSRECTAL  ULTRASONIC PROSTATE (TUBP);  Surgeon: Irine Seal, MD;  [facility]: [facility];  Service: Urology;  Laterality: N/A;  . TRANSTHORACIC ECHOCARDIOGRAM  08/28/2016   mild to moderate LVH, ef 55-60%/  mild AR and TR/  trivial MR       Home Medications    Prior to Admission medications   Medication Sig Start Date End Date Taking? Authorizing Provider  aspirin EC 81 MG EC tablet Take 1 tablet (81 mg total) by mouth daily. 08/30/16   Isaac Bliss, Rayford Halsted, MD  atorvastatin  (LIPITOR) 40 MG tablet TAKE ONE TABLET BY MOUTH DAILY AT 6 PM. 05/21/17   Fayrene Helper, MD  chlorproMAZINE (THORAZINE) 25 MG tablet Take 1 tablet (25 mg total) by mouth 2 (two) times daily as needed for hiccoughs. 01/11/17   Ripley Fraise, MD  HYDROcodone-acetaminophen (NORCO) 5-325 MG tablet Take 1 tablet by mouth every 6 (six) hours as needed for moderate pain. 03/05/17   Irine Seal, MD  levETIRAcetam (KEPPRA) 500 MG tablet TAKE ONE TABLET BY MOUTH 2 TIMES A DAY. 05/21/17   Raylene Everts, MD  ondansetron (ZOFRAN ODT) 4 MG disintegrating tablet Take 1 tablet (4 mg total) by mouth every 8 (eight) hours as needed for nausea or vomiting. 01/10/17   Long, Wonda Olds, MD  pantoprazole (PROTONIX) 40 MG tablet TAKE (1) TABLET BY MOUTH ONCE DAILY. 05/21/17   Fayrene Helper, MD  tamsulosin (FLOMAX) 0.4 MG CAPS capsule TAKE 1 CAPSULE BY MOUTH ONCE DAILY. 05/21/17   Timmothy Euler, MD    Family History Family History  Problem Relation Age of Onset  . Heart disease Brother   . Colon cancer Neg Hx     Social History Social History  Substance Use Topics  . Smoking status: Never Smoker  . Smokeless tobacco: Never Used  . Alcohol use No     Comment: hx alcohol abuse -- last drink 12/ 2017      Allergies   Patient has no known allergies.   Review of Systems Review of Systems  Gastrointestinal: Positive for constipation. Negative for abdominal pain.  All other systems reviewed and are negative.    Physical Exam Updated Vital Signs BP (!) 143/98   Pulse 85   Temp 98.2 F (36.8 C) (Oral)   Resp 18   Ht 5\' 5"  (1.651 m)   Wt 69.4 kg (153 lb)   SpO2 99%   BMI 25.46 kg/m   Physical Exam  Constitutional: He is oriented to person, place, and time. He appears well-developed and well-nourished. No distress.  HENT:  Head: Normocephalic and atraumatic.  Mouth/Throat: Oropharynx is clear and moist.  Neck: Normal range of motion. Neck supple.  Cardiovascular: Normal rate and  regular rhythm.  Exam reveals no friction rub.   No murmur heard. Pulmonary/Chest: Effort normal and breath sounds normal. No respiratory distress. He has no wheezes. He has no rales.  Abdominal: Soft. Bowel sounds are normal. He exhibits no distension. There is no tenderness.  Genitourinary:  Genitourinary Comments: Here are no hemorrhoids visible. There is a fecal impaction present.  Musculoskeletal: Normal range of motion. He exhibits no edema.  Neurological: He is alert and oriented to person, place, and time. Coordination normal.  Skin: Skin is warm and dry. He is not diaphoretic.  Nursing note and vitals reviewed.    ED Treatments / Results  Labs (all labs ordered are listed, but only abnormal results are displayed) Labs Reviewed - No data to display  EKG  EKG Interpretation None       Radiology No results found.  Procedures Procedures (including critical care time)  Medications Ordered in ED Medications - No data to display   Initial Impression / Assessment and Plan / ED Course  I have reviewed the triage vital signs and the nursing notes.  Pertinent labs & imaging results that were available during my care of the patient were reviewed by me and considered in my medical decision making (see chart for details).  Patient does have somewhat of a fecal impaction. He did not tolerate manual disimpaction well. He was given a mineral oil enema with some relief. His abdomen is benign and he is complaining of no discomfort. He will be discharged with magnesium citrate and when necessary return.  Final Clinical Impressions(s) / ED Diagnoses   Final diagnoses:  None    New Prescriptions New Prescriptions   No medications on file     Veryl Speak, MD 05/30/17 [phone]

## 2017-06-03 ENCOUNTER — Telehealth: Payer: Self-pay

## 2017-06-03 DIAGNOSIS — E785 Hyperlipidemia, unspecified: Secondary | ICD-10-CM

## 2017-06-03 DIAGNOSIS — I10 Essential (primary) hypertension: Secondary | ICD-10-CM

## 2017-06-03 DIAGNOSIS — R569 Unspecified convulsions: Secondary | ICD-10-CM

## 2017-06-03 NOTE — Telephone Encounter (Signed)
Labs re-ordered

## 2017-06-05 ENCOUNTER — Encounter: Payer: Self-pay | Admitting: Family Medicine

## 2017-06-06 ENCOUNTER — Ambulatory Visit: Payer: Medicare Other | Admitting: Family Medicine

## 2017-06-06 LAB — COMPLETE METABOLIC PANEL WITH GFR
AG RATIO: 1.3 (calc) (ref 1.0–2.5)
ALKALINE PHOSPHATASE (APISO): 82 U/L (ref 40–115)
ALT: 23 U/L (ref 9–46)
AST: 23 U/L (ref 10–35)
Albumin: 3.8 g/dL (ref 3.6–5.1)
BILIRUBIN TOTAL: 0.6 mg/dL (ref 0.2–1.2)
BUN: 14 mg/dL (ref 7–25)
CHLORIDE: 99 mmol/L (ref 98–110)
CO2: 33 mmol/L — ABNORMAL HIGH (ref 20–32)
Calcium: 8.8 mg/dL (ref 8.6–10.3)
Creat: 0.91 mg/dL (ref 0.70–1.18)
GFR, Est African American: 93 mL/min/{1.73_m2} (ref >=60)
GFR, Est Non African American: 80 mL/min/{1.73_m2} (ref >=60)
GLOBULIN: 3 g/dL (ref 1.9–3.7)
Glucose, Bld: 96 mg/dL (ref 65–99)
POTASSIUM: 4.4 mmol/L (ref 3.5–5.3)
SODIUM: 136 mmol/L (ref 135–146)
Total Protein: 6.8 g/dL (ref 6.1–8.1)

## 2017-06-06 LAB — LIPID PANEL
CHOLESTEROL: 163 mg/dL (ref <200)
HDL: 77 mg/dL (ref >40)
LDL Cholesterol (Calc): 73 mg/dL (calc)
Non-HDL Cholesterol (Calc): 86 mg/dL (calc) (ref <130)
Total CHOL/HDL Ratio: 2.1 (calc) (ref <5.0)
Triglycerides: 44 mg/dL (ref <150)

## 2017-06-06 LAB — CBC
HEMATOCRIT: 38.5 % (ref 38.5–50.0)
Hemoglobin: 12.8 g/dL — ABNORMAL LOW (ref 13.2–17.1)
MCH: 28.1 pg (ref 27.0–33.0)
MCHC: 33.2 g/dL (ref 32.0–36.0)
MCV: 84.6 fL (ref 80.0–100.0)
MPV: 9.7 fL (ref 7.5–12.5)
PLATELETS: 216 10*3/uL (ref 140–400)
RBC: 4.55 10*6/uL (ref 4.20–5.80)
RDW: 13.5 % (ref 11.0–15.0)
WBC: 4.7 10*3/uL (ref 3.8–10.8)

## 2017-06-06 LAB — URINALYSIS, ROUTINE W REFLEX MICROSCOPIC
Bilirubin Urine: NEGATIVE
Glucose, UA: NEGATIVE
HGB URINE DIPSTICK: NEGATIVE
KETONES UR: NEGATIVE
LEUKOCYTES UA: NEGATIVE
NITRITE: NEGATIVE
Protein, ur: NEGATIVE
Specific Gravity, Urine: 1.02 (ref 1.001–1.03)
pH: 7.5 (ref 5.0–8.0)

## 2017-06-06 LAB — LEVETIRACETAM LEVEL: Keppra (Levetiracetam): 24.1 ug/mL

## 2017-06-12 ENCOUNTER — Ambulatory Visit: Payer: Medicare Other | Admitting: Family Medicine

## 2017-06-13 ENCOUNTER — Ambulatory Visit (INDEPENDENT_AMBULATORY_CARE_PROVIDER_SITE_OTHER): Payer: Medicare Other | Admitting: Family Medicine

## 2017-06-13 ENCOUNTER — Encounter: Payer: Self-pay | Admitting: Family Medicine

## 2017-06-13 ENCOUNTER — Other Ambulatory Visit: Payer: Self-pay | Admitting: Family Medicine

## 2017-06-13 VITALS — BP 142/88 | HR 102 | Ht 65.0 in | Wt 157.0 lb

## 2017-06-13 DIAGNOSIS — C61 Malignant neoplasm of prostate: Secondary | ICD-10-CM | POA: Diagnosis not present

## 2017-06-13 DIAGNOSIS — E785 Hyperlipidemia, unspecified: Secondary | ICD-10-CM | POA: Diagnosis not present

## 2017-06-13 DIAGNOSIS — R569 Unspecified convulsions: Secondary | ICD-10-CM

## 2017-06-13 DIAGNOSIS — I10 Essential (primary) hypertension: Secondary | ICD-10-CM

## 2017-06-13 DIAGNOSIS — F1021 Alcohol dependence, in remission: Secondary | ICD-10-CM

## 2017-06-13 DIAGNOSIS — Z23 Encounter for immunization: Secondary | ICD-10-CM | POA: Diagnosis not present

## 2017-06-13 MED ORDER — HYDROCHLOROTHIAZIDE 25 MG PO TABS: 25.0000 mg | ORAL_TABLET | Freq: Every day | ORAL | 3 refills | Status: DC

## 2017-06-13 NOTE — Patient Instructions (Addendum)
You are doing well Continue to stay as active as you can manage  See the Urology specialist June 28, 2017 at 1:30 Get the blood work a week before  Take the BP medicine daily  See me in 3 months Call sooner for problems

## 2017-06-13 NOTE — Progress Notes (Signed)
Chief Complaint  Patient presents with  . Cerebrovascular Accident  Feels well No complaints No hematuria Some urine leaking if cannot get to BR in time Gets up 2-4 X a night Has not followed up with urology after his cancer diagnosis and does not know plan.  We called urology and he is to get a PSA next week and see his doctor on Oct 13. Compliant with medicine His BP is up the last few visits.  Previously took meds, but they were stopped.  Will put back on HCTZ,  His kidney function is good Recent labs reviewed and are good.  LD controlled.    Patient Active Problem List   Diagnosis Date Noted  . Alcoholism in remission ([place]) 06/13/2017  . Prostate cancer ([place]) 03/06/2017  . Hyperlipidemia 11/30/2016  . Seizure ([place]) 10/15/2016  . History of recent stroke 08/27/2016  . Hypertension 08/27/2016  . Esophageal ulcer 02/27/2016  . GI bleed 12/15/2015  . Reflux esophagitis   . Hiatal hernia     Outpatient Encounter Prescriptions as of 06/13/2017  Medication Sig  . aspirin EC 81 MG EC tablet Take 1 tablet (81 mg total) by mouth daily.  Marland Kitchen atorvastatin (LIPITOR) 40 MG tablet TAKE ONE TABLET BY MOUTH DAILY AT 6 PM.  . levETIRAcetam (KEPPRA) 500 MG tablet TAKE ONE TABLET BY MOUTH 2 TIMES A DAY.  . pantoprazole (PROTONIX) 40 MG tablet TAKE (1) TABLET BY MOUTH ONCE DAILY.  . tamsulosin (FLOMAX) 0.4 MG CAPS capsule TAKE 1 CAPSULE BY MOUTH ONCE DAILY.  . hydrochlorothiazide (HYDRODIURIL) 25 MG tablet Take 1 tablet (25 mg total) by mouth daily. For blood pressure   No facility-administered encounter medications on file as of 06/13/2017.     No Known Allergies  Review of Systems  Constitutional: Negative for activity change, appetite change and unexpected weight change.       Weight stable  HENT: Negative for congestion and trouble swallowing.   Eyes: Negative for photophobia and visual disturbance.  Respiratory: Negative for cough and shortness of breath.   Cardiovascular:  Negative for chest pain, palpitations and leg swelling.  Gastrointestinal: Negative for blood in stool, constipation and diarrhea.  Genitourinary: Positive for frequency. Negative for difficulty urinating and hematuria.  Musculoskeletal: Negative.  Negative for arthralgias and back pain.  Neurological: Negative for dizziness, seizures and facial asymmetry.  Psychiatric/Behavioral: Negative for dysphoric mood. The patient is not nervous/anxious.       BP (!) 142/88   Pulse (!) 102   Ht 5\' 5"  (1.651 m)   Wt 157 lb (71.2 kg)   SpO2 100%   BMI 26.13 kg/m   Physical Exam  Constitutional: He is oriented to person, place, and time. He appears well-developed and well-nourished.  Thin.  Cooperative.  smiling  HENT:  Head: Normocephalic and atraumatic.  Mouth/Throat: Oropharynx is clear and moist.  Eyes: Pupils are equal, round, and reactive to light. Conjunctivae are normal.  Neck: Normal range of motion. Neck supple. No thyromegaly present.  Cardiovascular: Normal rate, regular rhythm and normal heart sounds.   Pulmonary/Chest: Effort normal and breath sounds normal. No respiratory distress.  Abdominal: Soft. Bowel sounds are normal.  Musculoskeletal: Normal range of motion. He exhibits no edema.  Lymphadenopathy:    He has no cervical adenopathy.  Neurological: He is alert and oriented to person, place, and time.  Gait normal  Skin: Skin is warm and dry.  Psychiatric: He has a normal mood and affect. His behavior is normal.  Clock drawing and 3 item recall were normal  Nursing note and vitals reviewed.   ASSESSMENT/PLAN:  1. Essential hypertension Re start HCTZ  2. Seizure ([place]) None on medication.  He had a therapeutic kepra level last week  3. Hyperlipidemia, unspecified hyperlipidemia type controlled  4. Prostate cancer [facility]) Will follow with urology  5. Need for immunization against influenza done - Flu Vaccine QUAD 36+ mos IM  6. Alcoholism in remission  [facility]) Remote history   Patient Instructions  You are doing well Continue to stay as active as you can manage  See the Urology specialist June 28, 2017 at 1:30 Get the blood work a week before  Take the BP medicine daily  See me in 3 months Call sooner for problems   Raylene Everts, MD

## 2017-06-25 ENCOUNTER — Other Ambulatory Visit: Payer: Self-pay | Admitting: Family Medicine

## 2017-06-26 ENCOUNTER — Other Ambulatory Visit: Payer: Self-pay | Admitting: Family Medicine

## 2017-06-27 NOTE — Telephone Encounter (Signed)
Seen [phone] 

## 2017-06-28 ENCOUNTER — Ambulatory Visit: Payer: Medicare Other | Admitting: Urology

## 2017-07-19 ENCOUNTER — Other Ambulatory Visit: Payer: Self-pay | Admitting: Family Medicine

## 2017-07-19 ENCOUNTER — Ambulatory Visit (INDEPENDENT_AMBULATORY_CARE_PROVIDER_SITE_OTHER): Payer: Medicare Other | Admitting: Urology

## 2017-07-19 DIAGNOSIS — N403 Nodular prostate with lower urinary tract symptoms: Secondary | ICD-10-CM | POA: Diagnosis not present

## 2017-07-19 DIAGNOSIS — R972 Elevated prostate specific antigen [PSA]: Secondary | ICD-10-CM

## 2017-07-19 DIAGNOSIS — C775 Secondary and unspecified malignant neoplasm of intrapelvic lymph nodes: Secondary | ICD-10-CM

## 2017-07-19 DIAGNOSIS — N3943 Post-void dribbling: Secondary | ICD-10-CM | POA: Diagnosis not present

## 2017-07-19 DIAGNOSIS — C61 Malignant neoplasm of prostate: Secondary | ICD-10-CM | POA: Diagnosis not present

## 2017-07-22 NOTE — Telephone Encounter (Signed)
Seen [phone] 

## 2017-07-23 ENCOUNTER — Other Ambulatory Visit: Payer: Self-pay | Admitting: Family Medicine

## 2017-08-15 ENCOUNTER — Ambulatory Visit: Payer: Medicare Other

## 2017-08-15 VITALS — BP 112/68 | HR 88 | Temp 97.1°F | Resp 18 | Ht 65.0 in | Wt 162.1 lb

## 2017-08-15 DIAGNOSIS — Z0000 Encounter for general adult medical examination without abnormal findings: Secondary | ICD-10-CM

## 2017-08-15 NOTE — Progress Notes (Addendum)
Subjective:   Joshua Mckee is a 76 y.o. male who presents for an Initial Medicare Annual Wellness Visit.  Review of Systems        Objective:    Today's Vitals   08/15/17 1052  BP: 112/68  Pulse: 88  Resp: 18  Temp: (!) 97.1 F (36.2 C)  TempSrc: Temporal  SpO2: 100%  Weight: 162 lb 1.3 oz (73.5 kg)  Height: 5\' 5"  (1.651 m)  PainSc: 0-No pain   Body mass index is 26.97 kg/m.  Current Medications (verified) Outpatient Encounter Medications as of 08/15/2017  Medication Sig  . aspirin EC 81 MG EC tablet Take 1 tablet (81 mg total) by mouth daily.  Marland Kitchen atorvastatin (LIPITOR) 40 MG tablet TAKE ONE TABLET BY MOUTH DAILY AT 6 PM.  . hydrochlorothiazide (HYDRODIURIL) 25 MG tablet Take 1 tablet (25 mg total) by mouth daily. For blood pressure  . levETIRAcetam (KEPPRA) 500 MG tablet TAKE ONE TABLET BY MOUTH 2 TIMES A DAY.  . pantoprazole (PROTONIX) 40 MG tablet TAKE (1) TABLET BY MOUTH ONCE DAILY.  . tamsulosin (FLOMAX) 0.4 MG CAPS capsule TAKE 1 CAPSULE BY MOUTH ONCE DAILY.   No facility-administered encounter medications on file as of 08/15/2017.     Allergies (verified) Patient has no known allergies.   History: Past Medical History:  Diagnosis Date  . Aneurysm of right common iliac artery (HCC)    2.2 cm per CT 02-18-2017  . Arthritis   . GERD (gastroesophageal reflux disease)   . Hiatal hernia   . Hiccups    CHRONIC--- MULTIPLE ED VISITS  . History of alcohol abuse   . History of GI bleed    upper GI bleed from esophageal ulcer 01/ 2017 and 03/ 2017 from erosive esophageal gastiris  . 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 History of small bowel obstruction    04-06-2005  s/p  exp. lap. w/ lysis adhesions for partial sbo  . Hyperlipidemia   . Hyperlipidemia   . Hyperplasia of  prostate with lower urinary tract symptoms (LUTS)   . Hypertension   . LAFB (left anterior fascicular block)   . Mild cognitive impairment with memory loss    PROBABLE STROKE RESIDUAL BUT POSSIBLE PRIOR TO STROKE  . Partial seizure ([place])    x1  10-15-2016 at ED visit , post cva 08-27-2016  . Poor historian   . Prostate cancer [facility]) urologist-  dr Joshua Mckee  . Schatzki's ring   . Stroke ([place])   . Substance abuse [facility])    Past Surgical History:  Procedure Laterality Date  . COLONOSCOPY N/A 03/08/2016   Procedure: COLONOSCOPY;  Surgeon: Daneil Dolin, MD;  [facility];  Service: Endoscopy;  Laterality: N/A;  1300-moved to 1400  . ESOPHAGOGASTRODUODENOSCOPY N/A 10/05/2015   Dr. Gala Romney: erosive reflux esophagitis, non-critcal Schatki's ring non-manipulated  . ESOPHAGOGASTRODUODENOSCOPY N/A 12/15/2015   Dr. Rourk:non bleeding esophageal ulcer/small HH  . ESOPHAGOGASTRODUODENOSCOPY N/A 03/08/2016   Procedure: ESOPHAGOGASTRODUODENOSCOPY (EGD);  Surgeon: Daneil Dolin, MD;  [facility];  Service: Endoscopy;  Laterality: N/A;  . EXPLORATORY LAPAROTOMY W/ ADHESIOLYSIS  04/06/2005   partial SBO  . ORCHIECTOMY Bilateral 03/05/2017   Procedure: ORCHIECTOMY BILATERAL;  Surgeon: Irine Seal, MD;  Location: [facility];  Service: Urology;  Laterality: Bilateral;  . PROSTATE BIOPSY  N/A 03/05/2017   Procedure: BIOPSY TRANSRECTAL ULTRASONIC PROSTATE (TUBP);  Surgeon: Irine Seal, MD;  Location: [facility];  Service: Urology;  Laterality: N/A;  . TRANSTHORACIC ECHOCARDIOGRAM  08/28/2016   mild to moderate LVH, ef 55-60%/  mild AR and TR/  trivial MR   Family History  Problem Relation Age of Onset  . Colon cancer Neg Hx    Social History   Occupational History  . Occupation: retired  Tobacco Use  . Smoking status: Never Smoker  . Smokeless tobacco: Never Used  Substance and Sexual Activity  . Alcohol use: No    Comment: hx alcohol abuse -- last  drink 12/ 2017   . Drug use: No  . Sexual activity: Not on file   Tobacco Counseling Counseling given: Not Answered   Activities of Daily Living In your present state of health, do you have any difficulty performing the following activities: 08/15/2017 03/05/2017  Hearing? N N  Vision? N N  Difficulty concentrating or making decisions? N N  Walking or climbing stairs? N N  Dressing or bathing? N N  Doing errands, shopping? N -  Preparing Food and eating ? N -  Using the Toilet? N -  In the past six months, have you accidently leaked urine? N -  Do you have problems with loss of bowel control? N -  Managing your Medications? N -  Managing your Finances? N -  Housekeeping or managing your Housekeeping? N -  Some recent data might be hidden    Immunizations and Health Maintenance Immunization History  Administered Date(s) Administered  . Influenza,inj,Quad PF,6+ Mos 08/28/2016, 06/13/2017  . Pneumococcal Polysaccharide-23 08/28/2016   Health Maintenance Due  Topic Date Due  . Samul Dada  06/02/1960    Patient Care Team: Raylene Everts, MD as PCP - General (Family Medicine) Gala Romney Cristopher Estimable, MD as Consulting Physician (Gastroenterology) Irine Seal, MD as Attending Physician (Urology)  Indicate any recent Medical Services you may have received from other than [facility] providers in the past year (date may be approximate).    Assessment:   This is a routine wellness examination for [name].   Hearing/Vision screen No exam data present  Dietary issues and exercise activities discussed:    Goals    . Blood Pressure < 140/90      Depression Screen PHQ 2/9 Scores 08/15/2017 01/29/2017 11/30/2016  PHQ - 2 Score 0 0 0    Fall Risk Fall Risk  08/15/2017 01/29/2017 11/30/2016  Falls in the past year? Yes No No  Number falls in past yr: 2 or more - -  Injury with Fall? No - -  Risk for fall due to : Impaired balance/gait - -  Follow up Falls prevention discussed - -     Cognitive Function:     6CIT Screen 08/15/2017  What Year? 0 points  What month? 0 points  What time? 0 points  Count back from 20 0 points  Months in reverse 0 points  Repeat phrase 0 points  Total Score 0    Screening Tests Health Maintenance  Topic Date Due  . TETANUS/TDAP  06/02/1960  . PNA vac Low Risk Adult (2 of 2 - PCV13) 08/28/2017  . INFLUENZA VACCINE  Completed        Plan:     I have personally reviewed and noted the following in the patient's chart:   . Medical and social history . Use of alcohol, tobacco or illicit drugs  . Current  medications and supplements . Functional ability and status . Nutritional status . Physical activity . Advanced directives . List of other physicians . Hospitalizations, surgeries, and ER visits in previous 12 months . Vitals . Screenings to include cognitive, depression, and falls . Referrals and appointments  In addition, I have reviewed and discussed with patient certain preventive protocols, quality metrics, and best practice recommendations. A written personalized care plan for preventive services as well as general preventive health recommendations were provided to patient.     Ova Freshwater, LPN   31/28/1188

## 2017-08-15 NOTE — Patient Instructions (Signed)
Joshua Mckee , Thank you for taking time to come for your Medicare Wellness Visit. I appreciate your ongoing commitment to your health goals. Please review the following plan we discussed and let me know if I can assist you in the future.   Screening recommendations/referrals: Colonoscopy:done Recommended yearly ophthalmology/optometry visit for glaucoma screening and checkup Recommended yearly dental visit for hygiene and checkup  Vaccinations: Influenza vaccine: done Pneumococcal vaccine: done Tdap vaccine: discuss with pcp Shingles vaccine: discuss with pcp      Next appointment: 09/12/17 with dr Meda Coffee  Preventive Care 78 Years and Older, Male Preventive care refers to lifestyle choices and visits with your health care provider that can promote health and wellness. What does preventive care include?  A yearly physical exam. This is also called an annual well check.  Dental exams once or twice a year.  Routine eye exams. Ask your health care provider how often you should have your eyes checked.  Personal lifestyle choices, including:  Daily care of your teeth and gums.  Regular physical activity.  Eating a healthy diet.  Avoiding tobacco and drug use.  Limiting alcohol use.  Practicing safe sex.  Taking low doses of aspirin every day.  Taking vitamin and mineral supplements as recommended by your health care provider. What happens during an annual well check? The services and screenings done by your health care provider during your annual well check will depend on your age, overall health, lifestyle risk factors, and family history of disease. Counseling  Your health care provider may ask you questions about your:  Alcohol use.  Tobacco use.  Drug use.  Emotional well-being.  Home and relationship well-being.  Sexual activity.  Eating habits.  History of falls.  Memory and ability to understand (cognition).  Work and work Statistician. Screening    You may have the following tests or measurements:  Height, weight, and BMI.  Blood pressure.  Lipid and cholesterol levels. These may be checked every 5 years, or more frequently if you are over 74 years old.  Skin check.  Lung cancer screening. You may have this screening every year starting at age 20 if you have a 30-pack-year history of smoking and currently smoke or have quit within the past 15 years.  Fecal occult blood test (FOBT) of the stool. You may have this test every year starting at age 27.  Flexible sigmoidoscopy or colonoscopy. You may have a sigmoidoscopy every 5 years or a colonoscopy every 10 years starting at age 57.  Prostate cancer screening. Recommendations will vary depending on your family history and other risks.  Hepatitis C blood test.  Hepatitis B blood test.  Sexually transmitted disease (STD) testing.  Diabetes screening. This is done by checking your blood sugar (glucose) after you have not eaten for a while (fasting). You may have this done every 1-3 years.  Abdominal aortic aneurysm (AAA) screening. You may need this if you are a current or former smoker.  Osteoporosis. You may be screened starting at age 28 if you are at high risk. Talk with your health care provider about your test results, treatment options, and if necessary, the need for more tests. Vaccines  Your health care provider may recommend certain vaccines, such as:  Influenza vaccine. This is recommended every year.  Tetanus, diphtheria, and acellular pertussis (Tdap, Td) vaccine. You may need a Td booster every 10 years.  Zoster vaccine. You may need this after age 68.  Pneumococcal 13-valent conjugate (PCV13) vaccine. One  dose is recommended after age 16.  Pneumococcal polysaccharide (PPSV23) vaccine. One dose is recommended after age 69. Talk to your health care provider about which screenings and vaccines you need and how often you need them. This information is not  intended to replace advice given to you by your health care provider. Make sure you discuss any questions you have with your health care provider. Document Released: 09/30/2015 Document Revised: 05/23/2016 Document Reviewed: 07/05/2015 Elsevier Interactive Patient Education  2017 [place] Prevention in the Home Falls can cause injuries. They can happen to people of all ages. There are many things you can do to make your home safe and to help prevent falls. What can I do on the outside of my home?  Regularly fix the edges of walkways and driveways and fix any cracks.  Remove anything that might make you trip as you walk through a door, such as a raised step or threshold.  Trim any bushes or trees on the path to your home.  Use bright outdoor lighting.  Clear any walking paths of anything that might make someone trip, such as rocks or tools.  Regularly check to see if handrails are loose or broken. Make sure that both sides of any steps have handrails.  Any raised decks and porches should have guardrails on the edges.  Have any leaves, snow, or ice cleared regularly.  Use sand or salt on walking paths during winter.  Clean up any spills in your garage right away. This includes oil or grease spills. What can I do in the bathroom?  Use night lights.  Install grab bars by the toilet and in the tub and shower. Do not use towel bars as grab bars.  Use non-skid mats or decals in the tub or shower.  If you need to sit down in the shower, use a plastic, non-slip stool.  Keep the floor dry. Clean up any water that spills on the floor as soon as it happens.  Remove soap buildup in the tub or shower regularly.  Attach bath mats securely with double-sided non-slip rug tape.  Do not have throw rugs and other things on the floor that can make you trip. What can I do in the bedroom?  Use night lights.  Make sure that you have a light by your bed that is easy to reach.  Do  not use any sheets or blankets that are too big for your bed. They should not hang down onto the floor.  Have a firm chair that has side arms. You can use this for support while you get dressed.  Do not have throw rugs and other things on the floor that can make you trip. What can I do in the kitchen?  Clean up any spills right away.  Avoid walking on wet floors.  Keep items that you use a lot in easy-to-reach places.  If you need to reach something above you, use a strong step stool that has a grab bar.  Keep electrical cords out of the way.  Do not use floor polish or wax that makes floors slippery. If you must use wax, use non-skid floor wax.  Do not have throw rugs and other things on the floor that can make you trip. What can I do with my stairs?  Do not leave any items on the stairs.  Make sure that there are handrails on both sides of the stairs and use them. Fix handrails that are broken or  loose. Make sure that handrails are as long as the stairways.  Check any carpeting to make sure that it is firmly attached to the stairs. Fix any carpet that is loose or worn.  Avoid having throw rugs at the top or bottom of the stairs. If you do have throw rugs, attach them to the floor with carpet tape.  Make sure that you have a light switch at the top of the stairs and the bottom of the stairs. If you do not have them, ask someone to add them for you. What else can I do to help prevent falls?  Wear shoes that:  Do not have high heels.  Have rubber bottoms.  Are comfortable and fit you well.  Are closed at the toe. Do not wear sandals.  If you use a stepladder:  Make sure that it is fully opened. Do not climb a closed stepladder.  Make sure that both sides of the stepladder are locked into place.  Ask someone to hold it for you, if possible.  Clearly mark and make sure that you can see:  Any grab bars or handrails.  First and last steps.  Where the edge of each  step is.  Use tools that help you move around (mobility aids) if they are needed. These include:  Canes.  Walkers.  Scooters.  Crutches.  Turn on the lights when you go into a dark area. Replace any light bulbs as soon as they burn out.  Set up your furniture so you have a clear path. Avoid moving your furniture around.  If any of your floors are uneven, fix them.  If there are any pets around you, be aware of where they are.  Review your medicines with your doctor. Some medicines can make you feel dizzy. This can increase your chance of falling. Ask your doctor what other things that you can do to help prevent falls. This information is not intended to replace advice given to you by your health care provider. Make sure you discuss any questions you have with your health care provider. Document Released: 06/30/2009 Document Revised: 02/09/2016 Document Reviewed: 10/08/2014 Elsevier Interactive Patient Education  2017 [place] for Adults  A healthy lifestyle and preventive care can promote health and wellness. Preventive health guidelines for adults include the following key practices.  . A routine yearly physical is a good way to check with your health care provider about your health and preventive screening. It is a chance to share any concerns and updates on your health and to receive a thorough exam.  . Visit your dentist for a routine exam and preventive care every 6 months. Brush your teeth twice a day and floss once a day. Good oral hygiene prevents tooth decay and gum disease.  . The frequency of eye exams is based on your age, health, family medical history, use  of contact lenses, and other factors. Follow your health care provider's ecommendations for frequency of eye exams.  . Eat a healthy diet. Foods like vegetables, fruits, whole grains, low-fat dairy products, and lean protein foods contain the nutrients you need without too many calories.  Decrease your intake of foods high in solid fats, added sugars, and salt. Eat the right amount of calories for you. Get information about a proper diet from your health care provider, if necessary.  . Regular physical exercise is one of the most important things you can do for your health. Most adults should get at  least 150 minutes of moderate-intensity exercise (any activity that increases your heart rate and causes you to sweat) each week. In addition, most adults need muscle-strengthening exercises on 2 or more days a week.  Silver Sneakers may be a benefit available to you. To determine eligibility, you may visit the website: www.silversneakers.com or contact program at 3167773077 Mon-Fri between 8AM-8PM.   . Maintain a healthy weight. The body mass index (BMI) is a screening tool to identify possible weight problems. It provides an estimate of body fat based on height and weight. Your health care provider can find your BMI and can help you achieve or maintain a healthy weight.   For adults 20 years and older: ? A BMI below 18.5 is considered underweight. ? A BMI of 18.5 to 24.9 is normal. ? A BMI of 25 to 29.9 is considered overweight. ? A BMI of 30 and above is considered obese.   . Maintain normal blood lipids and cholesterol levels by exercising and minimizing your intake of saturated fat. Eat a balanced diet with plenty of fruit and vegetables. Blood tests for lipids and cholesterol should begin at age 47 and be repeated every 5 years. If your lipid or cholesterol levels are high, you are over 50, or you are at high risk for heart disease, you may need your cholesterol levels checked more frequently. Ongoing high lipid and cholesterol levels should be treated with medicines if diet and exercise are not working.  . If you smoke, find out from your health care provider how to quit. If you do not use tobacco, please do not start.  . If you choose to drink alcohol, please do not consume  more than 2 drinks per day. One drink is considered to be 12 ounces (355 mL) of beer, 5 ounces (148 mL) of wine, or 1.5 ounces (44 mL) of liquor.  . If you are 67-73 years old, ask your health care provider if you should take aspirin to prevent strokes.  . Use sunscreen. Apply sunscreen liberally and repeatedly throughout the day. You should seek shade when your shadow is shorter than you. Protect yourself by wearing long sleeves, pants, a wide-brimmed hat, and sunglasses year round, whenever you are outdoors.  . Once a month, do a whole body skin exam, using a mirror to look at the skin on your back. Tell your health care provider of new moles, moles that have irregular borders, moles that are larger than a pencil eraser, or moles that have changed in shape or color.

## 2017-09-12 ENCOUNTER — Ambulatory Visit: Payer: Medicare Other | Admitting: Family Medicine

## 2017-09-20 ENCOUNTER — Other Ambulatory Visit: Payer: Self-pay | Admitting: Family Medicine

## 2017-09-23 ENCOUNTER — Other Ambulatory Visit: Payer: Self-pay | Admitting: Family Medicine

## 2017-09-23 NOTE — Telephone Encounter (Signed)
Do you want to refill this ? 

## 2017-09-26 ENCOUNTER — Ambulatory Visit: Payer: Medicare Other | Admitting: Family Medicine

## 2017-10-02 ENCOUNTER — Other Ambulatory Visit: Payer: Self-pay

## 2017-10-02 ENCOUNTER — Ambulatory Visit (INDEPENDENT_AMBULATORY_CARE_PROVIDER_SITE_OTHER): Payer: Medicare Other | Admitting: Family Medicine

## 2017-10-02 ENCOUNTER — Encounter: Payer: Self-pay | Admitting: Family Medicine

## 2017-10-02 VITALS — BP 122/86 | HR 88 | Temp 97.8°F | Resp 18 | Ht 65.0 in | Wt 168.0 lb

## 2017-10-02 DIAGNOSIS — R569 Unspecified convulsions: Secondary | ICD-10-CM

## 2017-10-02 DIAGNOSIS — E785 Hyperlipidemia, unspecified: Secondary | ICD-10-CM

## 2017-10-02 DIAGNOSIS — I10 Essential (primary) hypertension: Secondary | ICD-10-CM

## 2017-10-02 DIAGNOSIS — Z23 Encounter for immunization: Secondary | ICD-10-CM | POA: Diagnosis not present

## 2017-10-02 LAB — COMPLETE METABOLIC PANEL WITH GFR
AG Ratio: 1.1 (calc) (ref 1.0–2.5)
ALBUMIN MSPROF: 3.7 g/dL (ref 3.6–5.1)
ALT: 19 U/L (ref 9–46)
AST: 22 U/L (ref 10–35)
Alkaline phosphatase (APISO): 77 U/L (ref 40–115)
BUN: 22 mg/dL (ref 7–25)
CALCIUM: 9.2 mg/dL (ref 8.6–10.3)
CO2: 33 mmol/L — AB (ref 20–32)
Chloride: 99 mmol/L (ref 98–110)
Creat: 0.98 mg/dL (ref 0.70–1.18)
GFR, EST AFRICAN AMERICAN: 85 mL/min/{1.73_m2} (ref >=60)
GFR, EST NON AFRICAN AMERICAN: 74 mL/min/{1.73_m2} (ref >=60)
GLUCOSE: 74 mg/dL (ref 65–139)
Globulin: 3.5 g/dL (calc) (ref 1.9–3.7)
Potassium: 4.4 mmol/L (ref 3.5–5.3)
Sodium: 139 mmol/L (ref 135–146)
TOTAL PROTEIN: 7.2 g/dL (ref 6.1–8.1)
Total Bilirubin: 0.5 mg/dL (ref 0.2–1.2)

## 2017-10-02 LAB — CBC
HCT: 38.7 % (ref 38.5–50.0)
HEMOGLOBIN: 13.2 g/dL (ref 13.2–17.1)
MCH: 29.2 pg (ref 27.0–33.0)
MCHC: 34.1 g/dL (ref 32.0–36.0)
MCV: 85.6 fL (ref 80.0–100.0)
MPV: 9.7 fL (ref 7.5–12.5)
PLATELETS: 199 10*3/uL (ref 140–400)
RBC: 4.52 10*6/uL (ref 4.20–5.80)
RDW: 12.9 % (ref 11.0–15.0)
WBC: 5.6 10*3/uL (ref 3.8–10.8)

## 2017-10-02 LAB — URINALYSIS, ROUTINE W REFLEX MICROSCOPIC
BILIRUBIN URINE: NEGATIVE
Glucose, UA: NEGATIVE
HGB URINE DIPSTICK: NEGATIVE
KETONES UR: NEGATIVE
Leukocytes, UA: NEGATIVE
NITRITE: NEGATIVE
Protein, ur: NEGATIVE
SPECIFIC GRAVITY, URINE: 1.017 (ref 1.001–1.03)
pH: 5.5 (ref 5.0–8.0)

## 2017-10-02 LAB — LIPID PANEL
Cholesterol: 159 mg/dL (ref <200)
HDL: 62 mg/dL (ref >40)
LDL CHOLESTEROL (CALC): 77 mg/dL
Non-HDL Cholesterol (Calc): 97 mg/dL (calc) (ref <130)
TRIGLYCERIDES: 110 mg/dL (ref <150)
Total CHOL/HDL Ratio: 2.6 (calc) (ref <5.0)

## 2017-10-02 NOTE — Progress Notes (Signed)
Chief Complaint  Patient presents with  . Follow-up  Patient is here for routine follow-up. He states that he feels well.  Appetite and energy are good.  No urinary or bowel complaint. He is compliant with his medications.  His blood pressure is well controlled. He goes to urology regularly.  He states his cancer is stable, and not causing him problems. He asked me whether he can drink 12 ounces of beer a day.  He has a history of alcoholism.  He also is on Keppra for seizures.  I told him that it is not a good idea, but if he had one beer occasionally it probably would not hurt him.  He should have no more than 1 beer.  He should avoid hard liquor.  He has been abstinent from drinking for some time. He is on a PPI for GERD.  He states it works well.   Patient Active Problem List   Diagnosis Date Noted  . Alcoholism in remission ([place]) 06/13/2017  . Prostate cancer ([place]) 03/06/2017  . Hyperlipidemia 11/30/2016  . Seizure ([place]) 10/15/2016  . History of recent stroke 08/27/2016  . Hypertension 08/27/2016  . Esophageal ulcer 02/27/2016  . GI bleed 12/15/2015  . Reflux esophagitis   . Hiatal hernia     Outpatient Encounter Medications as of 10/02/2017  Medication Sig  . aspirin EC 81 MG EC tablet Take 1 tablet (81 mg total) by mouth daily.  Marland Kitchen atorvastatin (LIPITOR) 40 MG tablet TAKE ONE TABLET BY MOUTH DAILY AT 6 PM.  . hydrochlorothiazide (HYDRODIURIL) 25 MG tablet Take 1 tablet (25 mg total) by mouth daily. For blood pressure  . levETIRAcetam (KEPPRA) 500 MG tablet TAKE ONE TABLET BY MOUTH 2 TIMES A DAY.  . pantoprazole (PROTONIX) 40 MG tablet TAKE (1) TABLET BY MOUTH ONCE DAILY.  . tamsulosin (FLOMAX) 0.4 MG CAPS capsule TAKE 1 CAPSULE BY MOUTH ONCE DAILY.   No facility-administered encounter medications on file as of 10/02/2017.     No Known Allergies  Review of Systems  Constitutional: Negative for activity change, appetite change and unexpected weight change.   Weight stable  HENT: Negative for congestion and trouble swallowing.   Eyes: Negative for photophobia and visual disturbance.  Respiratory: Negative for cough and shortness of breath.   Cardiovascular: Negative for chest pain, palpitations and leg swelling.  Gastrointestinal: Negative for blood in stool, constipation and diarrhea.  Genitourinary: Positive for frequency. Negative for difficulty urinating and hematuria.  Musculoskeletal: Negative.  Negative for arthralgias and back pain.  Neurological: Negative for dizziness, seizures and facial asymmetry.  Psychiatric/Behavioral: Negative for dysphoric mood. The patient is not nervous/anxious.     BP 122/86 (BP Location: Left Arm, Patient Position: Sitting, Cuff Size: Normal)   Pulse 88   Temp 97.8 F (36.6 C) (Temporal)   Resp 18   Ht 5\' 5"  (1.651 m)   Wt 168 lb 0.6 oz (76.2 kg)   SpO2 97%   BMI 27.96 kg/m   Physical Exam  Constitutional: He is oriented to person, place, and time. He appears well-developed and well-nourished.  Thin.  Cooperative.  smiling  HENT:  Head: Normocephalic and atraumatic.  Mouth/Throat: Oropharynx is clear and moist.  Eyes: Conjunctivae are normal. Pupils are equal, round, and reactive to light.  Neck: Normal range of motion. Neck supple. No thyromegaly present.  Cardiovascular: Normal rate, regular rhythm and normal heart sounds.  Pulmonary/Chest: Effort normal and breath sounds normal. No respiratory distress.  Abdominal: Soft. Bowel  sounds are normal.  Musculoskeletal: Normal range of motion. He exhibits no edema.  Lymphadenopathy:    He has no cervical adenopathy.  Neurological: He is alert and oriented to person, place, and time.  Gait normal  Skin: Skin is warm and dry.  Psychiatric: He has a normal mood and affect. His behavior is normal.  Nursing note and vitals reviewed.   ASSESSMENT/PLAN:  1. Essential hypertension Controlled - CBC - COMPLETE METABOLIC PANEL WITH GFR - Lipid  panel - Urinalysis, Routine w reflex microscopic  2. Hyperlipidemia, unspecified hyperlipidemia type Controlled  3. Seizure [facility]) History   Patient Instructions  You are doing well Need blood tests and a visit in six months Call sooner for problems  Please request recent urology notes   Raylene Everts, MD

## 2017-10-02 NOTE — Patient Instructions (Signed)
You are doing well Need blood tests and a visit in six months Call sooner for problems  Please request recent urology notes

## 2017-10-09 ENCOUNTER — Encounter: Payer: Self-pay | Admitting: Family Medicine

## 2017-10-14 ENCOUNTER — Other Ambulatory Visit: Payer: Self-pay | Admitting: Family Medicine

## 2017-11-15 ENCOUNTER — Other Ambulatory Visit: Payer: Self-pay | Admitting: Family Medicine

## 2017-11-18 NOTE — Telephone Encounter (Signed)
Seen 1 16 19

## 2017-11-21 ENCOUNTER — Other Ambulatory Visit: Payer: Self-pay | Admitting: Family Medicine

## 2017-11-22 ENCOUNTER — Ambulatory Visit: Payer: Medicare Other | Admitting: Urology

## 2017-11-25 ENCOUNTER — Encounter: Payer: Self-pay | Admitting: Family Medicine

## 2017-12-05 ENCOUNTER — Other Ambulatory Visit: Payer: Self-pay | Admitting: Family Medicine

## 2017-12-12 ENCOUNTER — Other Ambulatory Visit: Payer: Self-pay | Admitting: Family Medicine

## 2018-01-16 ENCOUNTER — Other Ambulatory Visit: Payer: Self-pay | Admitting: Family Medicine

## 2018-02-14 ENCOUNTER — Ambulatory Visit: Payer: Medicare Other | Admitting: Urology

## 2018-02-17 ENCOUNTER — Other Ambulatory Visit: Payer: Self-pay | Admitting: Family Medicine

## 2018-02-19 ENCOUNTER — Other Ambulatory Visit: Payer: Self-pay | Admitting: Family Medicine

## 2018-02-20 ENCOUNTER — Other Ambulatory Visit: Payer: Self-pay | Admitting: Family Medicine

## 2018-03-17 ENCOUNTER — Other Ambulatory Visit: Payer: Self-pay | Admitting: Family Medicine

## 2018-04-01 ENCOUNTER — Other Ambulatory Visit: Payer: Self-pay | Admitting: Family Medicine

## 2018-04-02 ENCOUNTER — Ambulatory Visit: Payer: Medicare Other | Admitting: Family Medicine

## 2018-04-08 ENCOUNTER — Other Ambulatory Visit: Payer: Self-pay | Admitting: Family Medicine

## 2018-04-09 ENCOUNTER — Other Ambulatory Visit: Payer: Self-pay | Admitting: Family Medicine

## 2018-04-14 ENCOUNTER — Other Ambulatory Visit: Payer: Self-pay | Admitting: Family Medicine

## 2018-04-17 ENCOUNTER — Other Ambulatory Visit: Payer: Self-pay | Admitting: Family Medicine

## 2018-04-23 ENCOUNTER — Other Ambulatory Visit: Payer: Self-pay | Admitting: Family Medicine

## 2018-05-08 ENCOUNTER — Other Ambulatory Visit: Payer: Self-pay | Admitting: Family Medicine

## 2018-05-09 ENCOUNTER — Ambulatory Visit: Payer: Medicare Other | Admitting: Family Medicine

## 2018-05-12 ENCOUNTER — Ambulatory Visit: Payer: Medicare Other | Admitting: Family Medicine

## 2018-05-14 ENCOUNTER — Encounter: Payer: Self-pay | Admitting: Family Medicine

## 2018-05-15 ENCOUNTER — Other Ambulatory Visit: Payer: Self-pay | Admitting: Family Medicine

## 2018-05-19 ENCOUNTER — Other Ambulatory Visit: Payer: Self-pay | Admitting: Family Medicine

## 2018-05-26 ENCOUNTER — Other Ambulatory Visit: Payer: Self-pay | Admitting: Family Medicine

## 2018-05-27 ENCOUNTER — Other Ambulatory Visit: Payer: Self-pay | Admitting: Family Medicine

## 2018-05-30 ENCOUNTER — Ambulatory Visit (INDEPENDENT_AMBULATORY_CARE_PROVIDER_SITE_OTHER): Payer: Medicare Other | Admitting: Urology

## 2018-05-30 DIAGNOSIS — C61 Malignant neoplasm of prostate: Secondary | ICD-10-CM | POA: Diagnosis not present

## 2018-05-30 DIAGNOSIS — C775 Secondary and unspecified malignant neoplasm of intrapelvic lymph nodes: Secondary | ICD-10-CM | POA: Diagnosis not present

## 2018-05-30 DIAGNOSIS — R351 Nocturia: Secondary | ICD-10-CM

## 2018-06-02 ENCOUNTER — Ambulatory Visit (INDEPENDENT_AMBULATORY_CARE_PROVIDER_SITE_OTHER): Payer: Medicare Other | Admitting: Family Medicine

## 2018-06-02 ENCOUNTER — Encounter: Payer: Self-pay | Admitting: Family Medicine

## 2018-06-02 VITALS — BP 100/67 | HR 88 | Temp 97.4°F | Ht 65.0 in | Wt 166.6 lb

## 2018-06-02 DIAGNOSIS — R569 Unspecified convulsions: Secondary | ICD-10-CM

## 2018-06-02 DIAGNOSIS — K2100 Gastro-esophageal reflux disease with esophagitis, without bleeding: Secondary | ICD-10-CM

## 2018-06-02 DIAGNOSIS — E782 Mixed hyperlipidemia: Secondary | ICD-10-CM | POA: Diagnosis not present

## 2018-06-02 DIAGNOSIS — E663 Overweight: Secondary | ICD-10-CM | POA: Insufficient documentation

## 2018-06-02 DIAGNOSIS — I10 Essential (primary) hypertension: Secondary | ICD-10-CM | POA: Diagnosis not present

## 2018-06-02 DIAGNOSIS — R296 Repeated falls: Secondary | ICD-10-CM

## 2018-06-02 MED ORDER — PANTOPRAZOLE SODIUM 40 MG PO TBEC: DELAYED_RELEASE_TABLET | ORAL | 3 refills | Status: AC

## 2018-06-02 MED ORDER — ATORVASTATIN CALCIUM 40 MG PO TABS: ORAL_TABLET | ORAL | 3 refills | Status: DC

## 2018-06-02 MED ORDER — TAMSULOSIN HCL 0.4 MG PO CAPS: 0.4000 mg | ORAL_CAPSULE | Freq: Every day | ORAL | 3 refills | Status: AC

## 2018-06-02 MED ORDER — HYDROCHLOROTHIAZIDE 25 MG PO TABS: 25.0000 mg | ORAL_TABLET | Freq: Every day | ORAL | 3 refills | Status: DC

## 2018-06-02 MED ORDER — LEVETIRACETAM 500 MG PO TABS: ORAL_TABLET | ORAL | 1 refills | Status: AC
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
## 2018-06-03 ENCOUNTER — Other Ambulatory Visit: Payer: Self-pay | Admitting: Urology

## 2018-06-03 DIAGNOSIS — C61 Malignant neoplasm of prostate: Secondary | ICD-10-CM

## 2018-06-10 ENCOUNTER — Other Ambulatory Visit: Payer: Self-pay | Admitting: Urology

## 2018-06-10 DIAGNOSIS — C775 Secondary and unspecified malignant neoplasm of intrapelvic lymph nodes: Secondary | ICD-10-CM

## 2018-06-10 DIAGNOSIS — C61 Malignant neoplasm of prostate: Secondary | ICD-10-CM

## 2018-06-12 ENCOUNTER — Encounter (HOSPITAL_COMMUNITY)
Admission: RE | Admit: 2018-06-12 | Discharge: 2018-06-12 | Disposition: A | Discharge status: Home / Self Care | Payer: Medicare Other | Source: Ambulatory Visit | Attending: Urology | Admitting: Urology

## 2018-06-12 DIAGNOSIS — C61 Malignant neoplasm of prostate: Secondary | ICD-10-CM | POA: Diagnosis present

## 2018-06-12 DIAGNOSIS — C775 Secondary and unspecified malignant neoplasm of intrapelvic lymph nodes: Secondary | ICD-10-CM | POA: Diagnosis not present

## 2018-06-12 MED ORDER — TECHNETIUM TC 99M MEDRONATE IV KIT
20.0000 | PACK | Freq: Once | INTRAVENOUS | Status: AC | PRN
  Administered 2018-06-12: 20.3 via INTRAVENOUS

## 2018-06-13 ENCOUNTER — Ambulatory Visit (HOSPITAL_COMMUNITY): Payer: Medicare Other

## 2018-06-20 ENCOUNTER — Encounter (HOSPITAL_COMMUNITY): Payer: Self-pay

## 2018-06-20 ENCOUNTER — Ambulatory Visit (HOSPITAL_COMMUNITY)
Admission: RE | Admit: 2018-06-20 | Discharge: 2018-06-20 | Disposition: A | Discharge status: Home / Self Care | Payer: Medicare Other | Source: Ambulatory Visit | Attending: Urology | Admitting: Urology

## 2018-06-20 DIAGNOSIS — I700 Atherosclerosis of aorta: Secondary | ICD-10-CM | POA: Diagnosis not present

## 2018-06-20 DIAGNOSIS — C61 Malignant neoplasm of prostate: Secondary | ICD-10-CM | POA: Insufficient documentation

## 2018-06-20 DIAGNOSIS — K7689 Other specified diseases of liver: Secondary | ICD-10-CM | POA: Diagnosis not present

## 2018-06-20 DIAGNOSIS — I723 Aneurysm of iliac artery: Secondary | ICD-10-CM | POA: Diagnosis not present

## 2018-06-20 LAB — POCT I-STAT CREATININE: CREATININE: 1.1 mg/dL (ref 0.61–1.24)

## 2018-06-20 MED ORDER — IOPAMIDOL (ISOVUE-300) INJECTION 61%
100.0000 mL | Freq: Once | INTRAVENOUS | Status: AC | PRN
  Administered 2018-06-20: 100 mL via INTRAVENOUS

## 2018-06-26 ENCOUNTER — Telehealth: Payer: Self-pay | Admitting: Family Medicine

## 2018-06-26 NOTE — Telephone Encounter (Signed)
Have patient come in for an appointment to discuss CT scan results and what we can do for it. Caryl Pina, MD [facility] 06/26/2018, 11:20 AM

## 2018-06-26 NOTE — Telephone Encounter (Signed)
Apt made

## 2018-07-01 ENCOUNTER — Ambulatory Visit (INDEPENDENT_AMBULATORY_CARE_PROVIDER_SITE_OTHER): Payer: Medicare Other | Admitting: Family Medicine

## 2018-07-01 ENCOUNTER — Encounter: Payer: Self-pay | Admitting: Family Medicine

## 2018-07-01 VITALS — BP 114/83 | HR 81 | Temp 97.0°F | Ht 65.0 in | Wt 169.6 lb

## 2018-07-01 DIAGNOSIS — Z23 Encounter for immunization: Secondary | ICD-10-CM

## 2018-07-01 DIAGNOSIS — K769 Liver disease, unspecified: Secondary | ICD-10-CM | POA: Diagnosis not present

## 2018-07-01 DIAGNOSIS — I700 Atherosclerosis of aorta: Secondary | ICD-10-CM

## 2018-07-01 DIAGNOSIS — I723 Aneurysm of iliac artery: Secondary | ICD-10-CM | POA: Diagnosis not present

## 2018-07-01 NOTE — Progress Notes (Signed)
BP 114/83   Pulse 81   Temp (!) 97 F (36.1 C) (Oral)   Ht 5\' 5"  (1.651 m)   Wt 169 lb 9.6 oz (76.9 kg)   BMI 28.22 kg/m    Subjective:    Patient ID: Joshua Mckee, male    DOB: Feb 25, 1941, 77 y.o.   MRN: [id number]  HPI: Joshua Mckee is a 77 y.o. male presenting on 07/01/2018 for discuss CT result and Back Pain (x 1 day)   HPI Liver lesion on CT Patient had a CT scan performed by his urologist that they recommended follow-up with [place] 4.  On the CT scan it showed improved adenopathy from his treatment for prostate cancer but it showed a new 8 mm lesion on the liver that they wanted him to follow-up with [place] for.  Patient denies any symptoms or abdominal pain or complaints of bloating or issues with his bowel movements.  Also on the CT scan they noted that the patient had aortic atherosclerosis, he is already on aspirin and a statin.  Also on the CT scan they noted that he had a 2.2 mm common iliac aneurysm and recommended follow-up for this.  Patient denies any symptoms from this, denies any abdominal pain or back pain.  Relevant past medical, surgical, family and social history reviewed and updated as indicated. Interim medical history since our last visit reviewed. Allergies and medications reviewed and updated.  Review of Systems  Constitutional: Negative for chills and fever.  Respiratory: Negative for shortness of breath and wheezing.   Cardiovascular: Negative for chest pain and leg swelling.  Musculoskeletal: Negative for back pain and gait problem.  Skin: Negative for rash.  Neurological: Negative for dizziness, weakness and light-headedness.  All other systems reviewed and are negative.   Per HPI unless specifically indicated above   Allergies as of 07/01/2018   No Known Allergies     Medication List        Accurate as of 07/01/18  8:57 AM. Always use your most recent med list.          aspirin 81 MG EC tablet Take 1 tablet (81 mg total) by mouth  daily.   atorvastatin 40 MG tablet Commonly known as:  LIPITOR TAKE ONE TABLET BY MOUTH DAILY AT 6 PM.   hydrochlorothiazide 25 MG tablet Commonly known as:  HYDRODIURIL Take 1 tablet (25 mg total) by mouth daily. For blood pressure   levETIRAcetam 500 MG tablet Commonly known as:  KEPPRA TAKE ONE TABLET BY MOUTH 2 TIMES A DAY.   pantoprazole 40 MG tablet Commonly known as:  PROTONIX TAKE (1) TABLET BY MOUTH ONCE DAILY.   tamsulosin 0.4 MG Caps capsule Commonly known as:  FLOMAX Take 1 capsule (0.4 mg total) by mouth daily.          Objective:    BP 114/83   Pulse 81   Temp (!) 97 F (36.1 C) (Oral)   Ht 5\' 5"  (1.651 m)   Wt 169 lb 9.6 oz (76.9 kg)   BMI 28.22 kg/m   Wt Readings from Last 3 Encounters:  07/01/18 169 lb 9.6 oz (76.9 kg)  06/02/18 166 lb 9.6 oz (75.6 kg)  10/02/17 168 lb 0.6 oz (76.2 kg)    Physical Exam  Constitutional: He is oriented to person, place, and time. He appears well-developed and well-nourished. No distress.  Eyes: Conjunctivae are normal. No scleral icterus.  Neck: Neck supple. No thyromegaly present.  Cardiovascular: Normal rate, regular rhythm,  normal heart sounds and intact distal pulses.  No murmur heard. Pulmonary/Chest: Effort normal and breath sounds normal. No respiratory distress. He has no wheezes.  Abdominal: Soft. Bowel sounds are normal. He exhibits no distension. There is no tenderness. There is no guarding.  Musculoskeletal: He exhibits no tenderness.  Lymphadenopathy:    He has no cervical adenopathy.  Neurological: He is alert and oriented to person, place, and time. Coordination normal.  Skin: Skin is warm and dry. No rash noted. He is not diaphoretic.  Psychiatric: He has a normal mood and affect. His behavior is normal.  Nursing note and vitals reviewed.   Patient had a CT scan of the abdomen and pelvis following up on his prostate cancer which showed a new liver lesion that is hypervascular and aortic  atherosclerosis and a 2.2 cm aneurysm of the right common iliac artery with fusiform dilatation    Assessment & Plan:   Problem List Items Addressed This Visit    None    Visit Diagnoses    Liver lesion    -  Primary   Relevant Orders   Ambulatory referral to Gastroenterology   Aortic atherosclerosis [facility])       Common iliac aneurysm [facility])       Relevant Orders   Ambulatory referral to Vascular Surgery   Encounter for immunization       Relevant Orders   Flu vaccine HIGH DOSE PF (Completed)      With new liver lesion and history of cancer on treatment for the cancer, will refer to gastroenterology for the liver lesion. With aneurysm we will send him to vascular surgery for their opinion.  Follow up plan: Return if symptoms worsen or fail to improve.  Counseling provided for all of the vaccine components Orders Placed This Encounter  Procedures  . Ambulatory referral to Gastroenterology  . Ambulatory referral to Vascular Surgery    Caryl Pina, MD [facility] 07/01/2018, 8:57 AM

## 2018-07-02 ENCOUNTER — Encounter: Payer: Self-pay | Admitting: Internal Medicine

## 2018-07-16 ENCOUNTER — Ambulatory Visit: Payer: Medicare Other | Admitting: *Deleted

## 2018-07-21 ENCOUNTER — Encounter: Payer: Self-pay | Admitting: Vascular Surgery

## 2018-07-21 ENCOUNTER — Ambulatory Visit (INDEPENDENT_AMBULATORY_CARE_PROVIDER_SITE_OTHER): Payer: Medicare Other | Admitting: Vascular Surgery

## 2018-07-21 VITALS — BP 125/83 | HR 81 | Temp 96.6°F | Resp 16 | Ht 66.5 in | Wt 169.0 lb

## 2018-07-21 DIAGNOSIS — I723 Aneurysm of iliac artery: Secondary | ICD-10-CM

## 2018-07-21 NOTE — Progress Notes (Signed)
[facility]  Patient name: Joshua Mckee MRN: [id number] DOB: 1941-09-04 Sex: male  REASON FOR CONSULT: Discuss incidental finding of right common iliac artery aneurysm  Seen today in our [place] office  HPI: Joshua Mckee is a 77 y.o. male, who is today for discussion of incidental finding of 2.2 cm right common iliac artery aneurysm.  He had a diagnosis of prostate cancer and was undergoing evaluation for this and had an incidental finding of a small right common iliac artery aneurysm.  A recent CT scan and this showed no change since his 2018 study.  He has no symptoms referable to this and has no history of lower extremity arterial insufficiency.  His history have a history of prior posterior brain stroke.  Very poor understanding of his medical issues.   Past Medical History:  Diagnosis Date  . Aneurysm of right common iliac artery (HCC)    2.2 cm per CT 02-18-2017  . Arthritis   . GERD (gastroesophageal reflux disease)   . Hiatal hernia   . Hiccups    CHRONIC--- MULTIPLE ED VISITS  . History of alcohol abuse   . History of GI bleed    upper GI bleed from esophageal ulcer 01/ 2017 and 03/ 2017 from erosive esophageal gastiris  . 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 History of small bowel obstruction    04-06-2005  s/p  exp. lap. w/ lysis adhesions for partial sbo  . Hyperlipidemia   . Hyperlipidemia   . Hyperplasia of prostate with lower urinary tract symptoms (LUTS)   . Hypertension   . LAFB (left anterior fascicular block)   . Mild cognitive impairment with memory loss    PROBABLE STROKE RESIDUAL BUT POSSIBLE PRIOR TO STROKE  . Partial seizure ([place])    x1  10-15-2016 at ED visit , post cva 08-27-2016  . Poor historian     . Prostate cancer [facility] urologist-  dr Jeffie Pollock  . Schatzki's ring   . Stroke ([place])   . Substance abuse ([place])     Family History  Problem Relation Age of Onset  . Colon cancer Neg Hx     SOCIAL HISTORY: Social History   Socioeconomic History  . Marital status: Widowed    Spouse name: Not on file  . Number of children: 5  . Years of education: 9th grade  . Highest education level: Not on file  Occupational History  . Occupation: retired  Scientific laboratory technician  . Financial resource strain: Not hard at all  . Food insecurity:    Worry: Sometimes true    Inability: Sometimes true  . Transportation needs:    Medical: No    Non-medical: No  Tobacco Use  . Smoking status: Never Smoker  . Smokeless tobacco: Never Used  Substance and Sexual Activity  . Alcohol use: No    Comment: hx alcohol abuse -- last drink 12/ 2017   . Drug use: No  . Sexual activity: Not on file  Lifestyle  . Physical activity:    Days per week: 0 days    Minutes per session: 0 min  . Stress: Not at all  Relationships  . Social connections:    Talks on phone: Once a week    Gets together: Twice a week    Attends religious  service: 1 to 4 times per year    Active member of club or organization: No    Attends meetings of clubs or organizations: Never    Relationship status: Widowed  . Intimate partner violence:    Fear of current or ex partner: No    Emotionally abused: No    Physically abused: No    Forced sexual activity: No  Other Topics Concern  . Not on file  Social History Narrative   Lives with lady friend [name]   Irene's daughter helps care for them    No Known Allergies  Current Outpatient Medications  Medication Sig Dispense Refill  . aspirin EC 81 MG EC tablet Take 1 tablet (81 mg total) by mouth daily.    Marland Kitchen atorvastatin (LIPITOR) 40 MG tablet TAKE ONE TABLET BY MOUTH DAILY AT 6 PM. 90 tablet 3  . hydrochlorothiazide (HYDRODIURIL) 25 MG tablet Take 1 tablet (25 mg total) by mouth daily.  For blood pressure 90 tablet 3  . levETIRAcetam (KEPPRA) 500 MG tablet TAKE ONE TABLET BY MOUTH 2 TIMES A DAY. 180 tablet 1  . pantoprazole (PROTONIX) 40 MG tablet TAKE (1) TABLET BY MOUTH ONCE DAILY. 90 tablet 3  . tamsulosin (FLOMAX) 0.4 MG CAPS capsule Take 1 capsule (0.4 mg total) by mouth daily. 90 capsule 3   No current facility-administered medications for this visit.     REVIEW OF SYSTEMS:  [X]  denotes positive finding, [ ]  denotes negative finding Cardiac  Comments:  Chest pain or chest pressure: x   Shortness of breath upon exertion: x   Short of breath when lying flat:    Irregular heart rhythm:        Vascular    Pain in calf, thigh, or hip brought on by ambulation:    Pain in feet at night that wakes you up from your sleep:     Blood clot in your veins:    Leg swelling:         Pulmonary    Oxygen at home:    Productive cough:     Wheezing:         Neurologic    Sudden weakness in arms or legs:     Sudden numbness in arms or legs:     Sudden onset of difficulty speaking or slurred speech:    Temporary loss of vision in one eye:     Problems with dizziness:         Gastrointestinal    Blood in stool:     Vomited blood:         Genitourinary    Burning when urinating:     Blood in urine:        Psychiatric    Major depression:         Hematologic    Bleeding problems:    Problems with blood clotting too easily:        Skin    Rashes or ulcers:        Constitutional    Fever or chills:      PHYSICAL EXAM: Vitals:   07/21/18 1004  BP: 125/83  Pulse: 81  Resp: 16  Temp: (!) 96.6 F (35.9 C)  TempSrc: Temporal  Weight: 169 lb (76.7 kg)  Height: 5' 6.5" (1.689 m)    GENERAL: The patient is a well-nourished male, in no acute distress. The vital signs are documented above. CARDIOVASCULAR: Carotid arteries are without bruits bilaterally.  2+ radial 2+ femoral 2+  popliteal pulses. PULMONARY: There is good air exchange  ABDOMEN: Soft and  non-tender no masses noted or aneurysm palpable MUSCULOSKELETAL: There are no major deformities or cyanosis. NEUROLOGIC: No focal weakness or paresthesias are detected. SKIN: There are no ulcers or rashes noted. PSYCHIATRIC: The patient has a normal affect.  DATA:  CT scan reveals 2.2 cm right common iliac artery aneurysm with no evidence of rupture  MEDICAL ISSUES: Discussed the significance of this with patient.  Symptoms of leaking aneurysm.  Explained this would be extremely unlikely in size.  Have recommended that we see him in 1 year with ultrasound to rule out increased size.   Rosetta Posner, MD FACS [facility] Tel [phone] Pager [phone]

## 2018-07-24 ENCOUNTER — Encounter: Payer: Self-pay | Admitting: Family Medicine

## 2018-08-22 ENCOUNTER — Ambulatory Visit: Payer: Medicare Other | Admitting: Urology

## 2018-08-28 ENCOUNTER — Encounter (HOSPITAL_COMMUNITY): Payer: Self-pay | Admitting: Emergency Medicine

## 2018-08-28 ENCOUNTER — Emergency Department (HOSPITAL_COMMUNITY): Payer: Medicare Other

## 2018-08-28 ENCOUNTER — Other Ambulatory Visit: Payer: Self-pay

## 2018-08-28 ENCOUNTER — Inpatient Hospital Stay (HOSPITAL_COMMUNITY)
Admission: EM | Admit: 2018-08-28 | Discharge: 2018-08-29 | DRG: 065 | Disposition: A | Discharge status: Home Health | Payer: Medicare Other | Attending: Family Medicine | Admitting: Family Medicine

## 2018-08-28 DIAGNOSIS — R29701 NIHSS score 1: Secondary | ICD-10-CM | POA: Diagnosis present

## 2018-08-28 DIAGNOSIS — N401 Enlarged prostate with lower urinary tract symptoms: Secondary | ICD-10-CM | POA: Diagnosis present

## 2018-08-28 DIAGNOSIS — R531 Weakness: Secondary | ICD-10-CM | POA: Diagnosis not present

## 2018-08-28 DIAGNOSIS — Z8673 Personal history of transient ischemic attack (TIA), and cerebral infarction without residual deficits: Secondary | ICD-10-CM | POA: Diagnosis present

## 2018-08-28 DIAGNOSIS — K2100 Gastro-esophageal reflux disease with esophagitis, without bleeding: Secondary | ICD-10-CM | POA: Diagnosis present

## 2018-08-28 DIAGNOSIS — G4089 Other seizures: Secondary | ICD-10-CM | POA: Diagnosis present

## 2018-08-28 DIAGNOSIS — Z8546 Personal history of malignant neoplasm of prostate: Secondary | ICD-10-CM

## 2018-08-28 DIAGNOSIS — Z79899 Other long term (current) drug therapy: Secondary | ICD-10-CM

## 2018-08-28 DIAGNOSIS — M48061 Spinal stenosis, lumbar region without neurogenic claudication: Secondary | ICD-10-CM | POA: Diagnosis present

## 2018-08-28 DIAGNOSIS — E782 Mixed hyperlipidemia: Secondary | ICD-10-CM

## 2018-08-28 DIAGNOSIS — I639 Cerebral infarction, unspecified: Principal | ICD-10-CM | POA: Diagnosis present

## 2018-08-28 DIAGNOSIS — R569 Unspecified convulsions: Secondary | ICD-10-CM | POA: Diagnosis not present

## 2018-08-28 DIAGNOSIS — F1021 Alcohol dependence, in remission: Secondary | ICD-10-CM | POA: Diagnosis present

## 2018-08-28 DIAGNOSIS — Z87891 Personal history of nicotine dependence: Secondary | ICD-10-CM

## 2018-08-28 DIAGNOSIS — I10 Essential (primary) hypertension: Secondary | ICD-10-CM | POA: Diagnosis present

## 2018-08-28 DIAGNOSIS — Z7982 Long term (current) use of aspirin: Secondary | ICD-10-CM | POA: Diagnosis not present

## 2018-08-28 DIAGNOSIS — I723 Aneurysm of iliac artery: Secondary | ICD-10-CM | POA: Diagnosis present

## 2018-08-28 DIAGNOSIS — E785 Hyperlipidemia, unspecified: Secondary | ICD-10-CM | POA: Diagnosis present

## 2018-08-28 DIAGNOSIS — I340 Nonrheumatic mitral (valve) insufficiency: Secondary | ICD-10-CM | POA: Diagnosis not present

## 2018-08-28 DIAGNOSIS — G3184 Mild cognitive impairment, so stated: Secondary | ICD-10-CM | POA: Diagnosis present

## 2018-08-28 DIAGNOSIS — I361 Nonrheumatic tricuspid (valve) insufficiency: Secondary | ICD-10-CM | POA: Diagnosis not present

## 2018-08-28 LAB — I-STAT CHEM 8, ED
BUN: 19 mg/dL (ref 8–23)
CALCIUM ION: 1.16 mmol/L (ref 1.15–1.40)
CREATININE: 0.9 mg/dL (ref 0.61–1.24)
Chloride: 96 mmol/L — ABNORMAL LOW (ref 98–111)
Glucose, Bld: 98 mg/dL (ref 70–99)
HEMATOCRIT: 41 % (ref 39.0–52.0)
Hemoglobin: 13.9 g/dL (ref 13.0–17.0)
Potassium: 3.5 mmol/L (ref 3.5–5.1)
SODIUM: 137 mmol/L (ref 135–145)
TCO2: 31 mmol/L (ref 22–32)

## 2018-08-28 LAB — COMPREHENSIVE METABOLIC PANEL
ALT: 65 U/L — ABNORMAL HIGH (ref 0–44)
ANION GAP: 6 (ref 5–15)
AST: 62 U/L — AB (ref 15–41)
Albumin: 3.7 g/dL (ref 3.5–5.0)
Alkaline Phosphatase: 83 U/L (ref 38–126)
BUN: 19 mg/dL (ref 8–23)
CHLORIDE: 99 mmol/L (ref 98–111)
CO2: 29 mmol/L (ref 22–32)
Calcium: 8.7 mg/dL — ABNORMAL LOW (ref 8.9–10.3)
Creatinine, Ser: 0.82 mg/dL (ref 0.61–1.24)
GFR calc Af Amer: >60 mL/min (ref >60)
GFR calc non Af Amer: >60 mL/min (ref >60)
GLUCOSE: 100 mg/dL — AB (ref 70–99)
POTASSIUM: 3.5 mmol/L (ref 3.5–5.1)
SODIUM: 134 mmol/L — AB (ref 135–145)
TOTAL PROTEIN: 7.5 g/dL (ref 6.5–8.1)
Total Bilirubin: 0.9 mg/dL (ref 0.3–1.2)

## 2018-08-28 LAB — RAPID URINE DRUG SCREEN, HOSP PERFORMED
Amphetamines: NOT DETECTED
BARBITURATES: NOT DETECTED
Benzodiazepines: NOT DETECTED
COCAINE: NOT DETECTED
OPIATES: NOT DETECTED
Tetrahydrocannabinol: NOT DETECTED

## 2018-08-28 LAB — DIFFERENTIAL
Abs Immature Granulocytes: 0.01 10*3/uL (ref 0.00–0.07)
BASOS ABS: 0 10*3/uL (ref 0.0–0.1)
BASOS PCT: 1 %
EOS ABS: 0.3 10*3/uL (ref 0.0–0.5)
Eosinophils Relative: 4 %
Immature Granulocytes: 0 %
LYMPHS PCT: 25 %
Lymphs Abs: 1.5 10*3/uL (ref 0.7–4.0)
MONO ABS: 0.7 10*3/uL (ref 0.1–1.0)
Monocytes Relative: 11 %
NEUTROS ABS: 3.6 10*3/uL (ref 1.7–7.7)
Neutrophils Relative %: 59 %

## 2018-08-28 LAB — URINALYSIS, ROUTINE W REFLEX MICROSCOPIC
BACTERIA UA: NONE SEEN
Bilirubin Urine: NEGATIVE
Glucose, UA: NEGATIVE mg/dL
KETONES UR: NEGATIVE mg/dL
Leukocytes, UA: NEGATIVE
NITRITE: NEGATIVE
PH: 5 (ref 5.0–8.0)
PROTEIN: NEGATIVE mg/dL
SPECIFIC GRAVITY, URINE: 1.017 (ref 1.005–1.030)

## 2018-08-28 LAB — I-STAT TROPONIN, ED: Troponin i, poc: 0.02 ng/mL (ref 0.00–0.08)

## 2018-08-28 LAB — PROTIME-INR
INR: 0.97
PROTHROMBIN TIME: 12.8 s (ref 11.4–15.2)

## 2018-08-28 LAB — CBC
HCT: 40.3 % (ref 39.0–52.0)
HEMOGLOBIN: 12.9 g/dL — AB (ref 13.0–17.0)
MCH: 27.9 pg (ref 26.0–34.0)
MCHC: 32 g/dL (ref 30.0–36.0)
MCV: 87.2 fL (ref 80.0–100.0)
PLATELETS: 189 10*3/uL (ref 150–400)
RBC: 4.62 MIL/uL (ref 4.22–5.81)
RDW: 14.3 % (ref 11.5–15.5)
WBC: 6.1 10*3/uL (ref 4.0–10.5)
nRBC: 0 % (ref 0.0–0.2)

## 2018-08-28 LAB — ETHANOL

## 2018-08-28 LAB — APTT: aPTT: 28 seconds (ref 24–36)

## 2018-08-28 MED ORDER — ENOXAPARIN SODIUM 40 MG/0.4ML ~~LOC~~ SOLN
40.0000 mg | SUBCUTANEOUS | Status: DC
  Administered 2018-08-28: 40 mg via SUBCUTANEOUS
  Filled 2018-08-28: qty 0.4

## 2018-08-28 MED ORDER — ACETAMINOPHEN 325 MG PO TABS: 650.0000 mg | ORAL_TABLET | ORAL | Status: DC | PRN

## 2018-08-28 MED ORDER — VITAMIN B-1 100 MG PO TABS
100.0000 mg | ORAL_TABLET | Freq: Every day | ORAL | Status: DC
  Administered 2018-08-28 – 2018-08-29 (×2): 100 mg via ORAL
  Filled 2018-08-28 (×2): qty 1

## 2018-08-28 MED ORDER — ATORVASTATIN CALCIUM 40 MG PO TABS
80.0000 mg | ORAL_TABLET | Freq: Every day | ORAL | Status: DC
  Administered 2018-08-28: 80 mg via ORAL
  Filled 2018-08-28: qty 2

## 2018-08-28 MED ORDER — HYDRALAZINE HCL 20 MG/ML IJ SOLN: 10.0000 mg | INTRAMUSCULAR | Status: DC | PRN

## 2018-08-28 MED ORDER — LEVETIRACETAM 500 MG PO TABS
500.0000 mg | ORAL_TABLET | Freq: Two times a day (BID) | ORAL | Status: DC
  Administered 2018-08-28 – 2018-08-29 (×2): 500 mg via ORAL
  Filled 2018-08-28 (×2): qty 1

## 2018-08-28 MED ORDER — ACETAMINOPHEN 650 MG RE SUPP: 650.0000 mg | RECTAL | Status: DC | PRN

## 2018-08-28 MED ORDER — ACETAMINOPHEN 160 MG/5ML PO SOLN: 650.0000 mg | ORAL | Status: DC | PRN

## 2018-08-28 MED ORDER — PANTOPRAZOLE SODIUM 40 MG PO TBEC
40.0000 mg | DELAYED_RELEASE_TABLET | Freq: Every day | ORAL | Status: DC
  Administered 2018-08-28 – 2018-08-29 (×2): 40 mg via ORAL
  Filled 2018-08-28 (×2): qty 1

## 2018-08-28 MED ORDER — STROKE: EARLY STAGES OF RECOVERY BOOK
Freq: Once | Status: AC
  Administered 2018-08-28: 16:00:00
  Filled 2018-08-28: qty 1

## 2018-08-28 MED ORDER — TAMSULOSIN HCL 0.4 MG PO CAPS
0.4000 mg | ORAL_CAPSULE | Freq: Every day | ORAL | Status: DC
  Administered 2018-08-28 – 2018-08-29 (×2): 0.4 mg via ORAL
  Filled 2018-08-28 (×2): qty 1

## 2018-08-28 MED ORDER — SENNOSIDES-DOCUSATE SODIUM 8.6-50 MG PO TABS: 1.0000 | ORAL_TABLET | Freq: Every evening | ORAL | Status: DC | PRN

## 2018-08-28 MED ORDER — FOLIC ACID 1 MG PO TABS
1.0000 mg | ORAL_TABLET | Freq: Every day | ORAL | Status: DC
  Administered 2018-08-28 – 2018-08-29 (×2): 1 mg via ORAL
  Filled 2018-08-28 (×2): qty 1

## 2018-08-28 MED ORDER — ASPIRIN 325 MG PO TABS
325.0000 mg | ORAL_TABLET | Freq: Every day | ORAL | Status: DC
  Administered 2018-08-28 – 2018-08-29 (×2): 325 mg via ORAL
  Filled 2018-08-28 (×2): qty 1

## 2018-08-28 NOTE — H&P (Signed)
History and Physical   Joshua Mckee ZHG:[id number] DOB: [birth year] DOA: 08/28/2018  Referring MD/NP/PA: Dr. Roderic Palau, [place] PCP: Chipper Herb, MD Outpatient Specialists: None  Patient coming from: Home  Chief Complaint: Left leg weakness  HPI: Joshua Mckee is a 77 y.o. male with a history of HTN, HLD, GERD, BPH, right common iliac artery aneurysm, and seizure disorder who presented from home by EMS for inability to walk due to left leg weakness. He is a poor historian but reports a few days of weakness in the left leg, difficult to lift and affecting gait. He usually uses a cane but had to start using his girlfriend's walker for increasing, constant, moderate weakness in the left leg but this morning he was unable to even get up so asked girlfriend to call EMS.   ED Course: No distress with largely unremarkable labs, negative EtOH, UDS, nonacute CT head, but subsequently MRI demonstrated right prefrontal gyrus stroke thought to explain left leg weakness.   Review of Systems: Denies fever, chills, weight loss, changes in vision or hearing, headache, cough, sore throat, chest pain, palpitations, shortness of breath, abdominal pain, nausea, vomiting, changes in bowel habits, blood in stool, change in bladder habits, myalgias, arthralgias, rash, and per HPI. All others reviewed and are negative.   Past Medical History:  Diagnosis Date  . Aneurysm of right common iliac artery (HCC)    2.2 cm per CT 02-18-2017  . Arthritis   . GERD (gastroesophageal reflux disease)   . Hiatal hernia   . Hiccups    CHRONIC--- MULTIPLE ED VISITS  . History of alcohol abuse   . History of GI bleed    upper GI bleed from esophageal ulcer 01/ 2017 and 03/ 2017 from erosive esophageal gastiris  . 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 History of small bowel obstruction    04-06-2005  s/p  exp. lap. w/ lysis adhesions for partial sbo  . Hyperlipidemia   . Hyperlipidemia   . Hyperplasia of prostate with lower urinary tract symptoms (LUTS)   . Hypertension   . LAFB (left anterior fascicular block)   . Mild cognitive impairment with memory loss    PROBABLE STROKE RESIDUAL BUT POSSIBLE PRIOR TO STROKE  . Partial seizure ([place])    x1  10-15-2016 at ED visit , post cva 08-27-2016  . Poor historian   . Prostate cancer [facility]) urologist-  dr Jeffie Pollock  . Schatzki's ring   . Stroke ([place])   . Substance abuse [facility])    Past Surgical History:  Procedure Laterality Date  . COLONOSCOPY N/A 03/08/2016   Procedure: COLONOSCOPY;  Surgeon: Daneil Dolin, MD;  [facility];  Service: Endoscopy;  Laterality: N/A;  1300-moved to 1400  . ESOPHAGOGASTRODUODENOSCOPY N/A 10/05/2015   Dr. Gala Romney: erosive reflux esophagitis, non-critcal Schatki's ring non-manipulated  . ESOPHAGOGASTRODUODENOSCOPY N/A 12/15/2015   Dr. Rourk:non bleeding esophageal ulcer/small HH  . ESOPHAGOGASTRODUODENOSCOPY N/A 03/08/2016   Procedure: ESOPHAGOGASTRODUODENOSCOPY (EGD);  Surgeon: Daneil Dolin, MD;  Location: [facility];  Service: Endoscopy;  Laterality: N/A;  . EXPLORATORY LAPAROTOMY W/ ADHESIOLYSIS  04/06/2005   partial SBO  . ORCHIECTOMY Bilateral 03/05/2017   Procedure: ORCHIECTOMY BILATERAL;  Surgeon: Irine Seal, MD;  Location: [facility];  Service: Urology;  Laterality: Bilateral;  . PROSTATE BIOPSY N/A 03/05/2017  Procedure: BIOPSY TRANSRECTAL ULTRASONIC PROSTATE (TUBP);  Surgeon: Irine Seal, MD;  [facility];  Service: Urology;  Laterality: N/A;  . TRANSTHORACIC ECHOCARDIOGRAM  08/28/2016   mild to moderate LVH, ef 55-60%/  mild AR and TR/  trivial MR   - Smoked cigars for a short time as a young adult, none recently at all. Reports drinking 1 shot of whiskey recently and another shot last week. Lives  in apartment with girlfriend.   No Known Allergies Family History  Problem Relation Age of Onset  . Colon cancer Neg Hx    - Family history otherwise reviewed and not pertinent.  Prior to Admission medications   Medication Sig Start Date End Date Taking? Authorizing Provider  aspirin EC 81 MG EC tablet Take 1 tablet (81 mg total) by mouth daily. 08/30/16  Yes Isaac Bliss, Rayford Halsted, MD  atorvastatin (LIPITOR) 40 MG tablet TAKE ONE TABLET BY MOUTH DAILY AT 6 PM. 06/02/18  Yes Dettinger, Fransisca Kaufmann, MD  hydrochlorothiazide (HYDRODIURIL) 25 MG tablet Take 1 tablet (25 mg total) by mouth daily. For blood pressure 06/02/18  Yes Dettinger, Fransisca Kaufmann, MD  levETIRAcetam (KEPPRA) 500 MG tablet TAKE ONE TABLET BY MOUTH 2 TIMES A DAY. 06/02/18  Yes Dettinger, Fransisca Kaufmann, MD  pantoprazole (PROTONIX) 40 MG tablet TAKE (1) TABLET BY MOUTH ONCE DAILY. 06/02/18  Yes Dettinger, Fransisca Kaufmann, MD  tamsulosin (FLOMAX) 0.4 MG CAPS capsule Take 1 capsule (0.4 mg total) by mouth daily. 06/02/18  Yes Dettinger, Fransisca Kaufmann, MD    Physical Exam: Vitals:   08/28/18 1130 08/28/18 1200 08/28/18 1230 08/28/18 1341  BP: 109/78 97/76 135/83 124/80  Pulse: 71 73 85 81  Resp: 14 16 (!) 22 18  Temp:    97.9 F (36.6 C)  TempSrc:    Oral  SpO2: 99% 98% 95% 100%  Weight:    77.2 kg  Height:    5\' 9"  (1.753 m)   Constitutional: 77 y.o. male in no distress, calm demeanor Eyes: Lids and conjunctivae normal, PERRL ENMT: Mucous membranes are moist. Posterior pharynx clear of any exudate or lesions. Poor dentition.  Neck: normal, supple, no masses, no thyromegaly Respiratory: Non-labored breathing room air without accessory muscle use. Clear breath sounds to auscultation bilaterally Cardiovascular: Regular rate and rhythm, no murmurs, rubs, or gallops. No carotid bruits. No JVD. No LE edema. 2+ pedal pulses. Abdomen: Normoactive bowel sounds. No tenderness, non-distended, and no masses palpated. No hepatosplenomegaly. GU: No  indwelling catheter Musculoskeletal: No clubbing / cyanosis. No joint deformity upper and lower extremities. Good ROM, no contractures. Normal muscle tone.  Skin: Warm, dry. No rashes, wounds, or ulcers. No significant lesions noted.  Neurologic: CN II-XII grossly intact. Gait unsteady with favoring left leg, left foot drop modestly, hip 4/5, knee 4/5, ankle 3/5, no clonus or sensory deficits noted. Speech with some stuttering but at baseline per pt and daughter. No saddle paresthesia.   Psychiatric: Alert and oriented x3. Cognitive status is impaired modestly. Euthymic with congruent affect.   Labs on Admission: I have personally reviewed following labs and imaging studies  CBC: Recent Labs  Lab 08/28/18 0603 08/28/18 0618  WBC 6.1  --   NEUTROABS 3.6  --   HGB 12.9* 13.9  HCT 40.3 41.0  MCV 87.2  --   PLT 189  --    Basic Metabolic Panel: Recent Labs  Lab 08/28/18 0603 08/28/18 0618  NA 134* 137  K 3.5 3.5  CL 99 96*  CO2 29  --   GLUCOSE 100* 98  BUN 19 19  CREATININE 0.82 0.90  CALCIUM 8.7*  --    GFR: Estimated Creatinine Clearance: 66.6 mL/min (by C-G formula based on SCr of 0.9 mg/dL). Liver Function Tests: Recent Labs  Lab 08/28/18 0603  AST 62*  ALT 65*  ALKPHOS 83  BILITOT 0.9  PROT 7.5  ALBUMIN 3.7   No results for input(s): LIPASE, AMYLASE in the last 168 hours. No results for input(s): AMMONIA in the last 168 hours. Coagulation Profile: Recent Labs  Lab 08/28/18 0603  INR 0.97   Cardiac Enzymes: No results for input(s): CKTOTAL, CKMB, CKMBINDEX, TROPONINI in the last 168 hours. BNP (last 3 results) No results for input(s): PROBNP in the last 8760 hours. HbA1C: No results for input(s): HGBA1C in the last 72 hours. CBG: No results for input(s): GLUCAP in the last 168 hours. Lipid Profile: No results for input(s): CHOL, HDL, LDLCALC, TRIG, CHOLHDL, LDLDIRECT in the last 72 hours. Thyroid Function Tests: No results for input(s): TSH, T4TOTAL,  FREET4, T3FREE, THYROIDAB in the last 72 hours. Anemia Panel: No results for input(s): VITAMINB12, FOLATE, FERRITIN, TIBC, IRON, RETICCTPCT in the last 72 hours. Urine analysis:    Component Value Date/Time   COLORURINE YELLOW 08/28/2018 0603   APPEARANCEUR CLEAR 08/28/2018 0603   APPEARANCEUR Clear 01/29/2017 1318   LABSPEC 1.017 08/28/2018 0603   PHURINE 5.0 08/28/2018 0603   GLUCOSEU NEGATIVE 08/28/2018 0603   HGBUR SMALL (A) 08/28/2018 0603   BILIRUBINUR NEGATIVE 08/28/2018 0603   BILIRUBINUR Negative 01/29/2017 1318   KETONESUR NEGATIVE 08/28/2018 0603   PROTEINUR NEGATIVE 08/28/2018 0603   UROBILINOGEN 0.2 08/23/2012 1615   NITRITE NEGATIVE 08/28/2018 0603   LEUKOCYTESUR NEGATIVE 08/28/2018 0603   LEUKOCYTESUR Negative 01/29/2017 1318    No results found for this or any previous visit (from the past 240 hour(s)).   Radiological Exams on Admission: Dg Chest 2 View  Result Date: 08/28/2018 CLINICAL DATA:  77 year old male with history of prostate cancer and back pain. EXAM: CHEST - 2 VIEW COMPARISON:  Chest radiograph dated 08/27/2016 FINDINGS: The lungs are clear. There is no pleural effusion or pneumothorax. The cardiac silhouette is within normal limits. There is mild tortuosity of the thoracic aorta. There is degenerative changes of the spine. No acute osseous pathology. IMPRESSION: No active cardiopulmonary disease. Electronically Signed   By: Anner Crete M.D.   On: 08/28/2018 06:45   Dg Lumbar Spine Complete  Result Date: 08/28/2018 CLINICAL DATA:  77 year old male with back pain. EXAM: LUMBAR SPINE - COMPLETE 4+ VIEW COMPARISON:  CT of the abdomen pelvis dated 06/20/2018 FINDINGS: There is no acute fracture or subluxation of the lumbar spine. There are multilevel degenerative changes with osteophyte. Multilevel facet arthropathy. The soft tissues are unremarkable. IMPRESSION: No acute fracture or subluxation. Electronically Signed   By: Anner Crete M.D.   On:  08/28/2018 06:43   Ct Head Wo Contrast  Result Date: 08/28/2018 CLINICAL DATA:  77 year old male with generalized weakness. EXAM: CT HEAD WITHOUT CONTRAST TECHNIQUE: Contiguous axial images were obtained from the base of the skull through the vertex without intravenous contrast. COMPARISON:  Head CT dated 08/27/2016 FINDINGS: Brain: There is mild age-related atrophy and chronic microvascular ischemic changes. Stable focal hypodense area lateral to the right thalamus likely related to a small old lacunar infarct. There is dilatation of the ventricles out of proportion with the sulci which may represent central volume loss versus normal pressure hydrocephalus. Clinical correlation is  recommended. The degree of ventriculomegaly is similar to prior CT. There is no acute intracranial hemorrhage. No mass effect or midline shift. No extra-axial fluid collection. Vascular: No hyperdense vessel or unexpected calcification. Skull: Normal. Negative for fracture or focal lesion. Sinuses/Orbits: No acute finding. Other: None IMPRESSION: 1. No acute intracranial hemorrhage. 2. Mild age-related atrophy and chronic microvascular ischemic changes. Probable old small right thalamic lacunar infarct 3. Stable ventriculomegaly. Electronically Signed   By: Anner Crete M.D.   On: 08/28/2018 06:54   Mr Jodene Nam Head Wo Contrast  Result Date: 08/28/2018 CLINICAL DATA:  Altered mental status for 2 days. EXAM: MRI HEAD WITHOUT CONTRAST MRA HEAD WITHOUT CONTRAST TECHNIQUE: Multiplanar, multiecho pulse sequences of the brain and surrounding structures were obtained without intravenous contrast. Angiographic images of the head were obtained using MRA technique without contrast. COMPARISON:  Head CT from earlier today.  Brain MRI 10/15/2016 FINDINGS: MRI HEAD FINDINGS Brain: 7 mm acute infarct along the right precentral gyrus in the region of left hand function. Remote lacunar infarct in the right thalamus. Chronic lateral  ventriculomegaly without obstructive finding. There is no callosal angle narrowing or disproportionate sulcal crowding, favor ex vacuo dilatation. No acute hemorrhage, collection, or masslike finding. Vascular: Major flow voids are preserved.  Arterial findings below. Skull and upper cervical spine: Negative for marrow lesion Sinuses/Orbits: No significant finding MRA HEAD FINDINGS Symmetric carotid and vertebral arteries. No branch occlusion, beading, or aneurysm. IMPRESSION: Brain MRI: 1. Subcentimeter acute infarct in the right precentral gyrus. 2. Chronic volume loss and ventriculomegaly. 3. Remote lacunar infarct in the right thalamus. Intracranial MRA: Negative. Electronically Signed   By: Monte Fantasia M.D.   On: 08/28/2018 09:54   Mr Brain Wo Contrast  Result Date: 08/28/2018 CLINICAL DATA:  Altered mental status for 2 days. EXAM: MRI HEAD WITHOUT CONTRAST MRA HEAD WITHOUT CONTRAST TECHNIQUE: Multiplanar, multiecho pulse sequences of the brain and surrounding structures were obtained without intravenous contrast. Angiographic images of the head were obtained using MRA technique without contrast. COMPARISON:  Head CT from earlier today.  Brain MRI 10/15/2016 FINDINGS: MRI HEAD FINDINGS Brain: 7 mm acute infarct along the right precentral gyrus in the region of left hand function. Remote lacunar infarct in the right thalamus. Chronic lateral ventriculomegaly without obstructive finding. There is no callosal angle narrowing or disproportionate sulcal crowding, favor ex vacuo dilatation. No acute hemorrhage, collection, or masslike finding. Vascular: Major flow voids are preserved.  Arterial findings below. Skull and upper cervical spine: Negative for marrow lesion Sinuses/Orbits: No significant finding MRA HEAD FINDINGS Symmetric carotid and vertebral arteries. No branch occlusion, beading, or aneurysm. IMPRESSION: Brain MRI: 1. Subcentimeter acute infarct in the right precentral gyrus. 2. Chronic volume  loss and ventriculomegaly. 3. Remote lacunar infarct in the right thalamus. Intracranial MRA: Negative. Electronically Signed   By: Monte Fantasia M.D.   On: 08/28/2018 09:54   Mr Lumbar Spine Wo Contrast  Result Date: 08/28/2018 CLINICAL DATA:  Low back pain. Difficulty walking. No known injury. EXAM: MRI LUMBAR SPINE WITHOUT CONTRAST TECHNIQUE: Multiplanar, multisequence MR imaging of the lumbar spine was performed. No intravenous contrast was administered. COMPARISON:  Plain films lumbar spine this same day. CT abdomen and pelvis 06/20/2018. FINDINGS: Segmentation:  Standard. Alignment:  Trace anterolisthesis L3 on L4 noted. Vertebrae: Scattered degenerative endplate signal change is seen. There is a Schmorl's node in the superior endplate of L1. No fracture or worrisome lesion. 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  Conus medullaris and cauda equina: Conus extends to the L1 level. Conus and cauda equina appear normal. Paraspinal and other soft tissues: T2 hyperintense lesions in the liver consistent with cyst as seen on the prior CT. Disc levels: T11-12: Shallow disc bulge and ligamentum flavum thickening are seen. The central canal is open. Mild to moderate foraminal narrowing is worse on the left. T12-L1: Minimal disc bulge without stenosis. L1-2: Shallow disc bulge causes mild central canal narrowing. Mild to moderate facet degenerative change is present. The foramina are open. L2-3: Shallow disc bulge and mild-to-moderate facet degenerative change. There is mild central canal narrowing. The foramina are open. L3-4: Broad-based disc bulge, ligamentum flavum thickening and moderate facet degenerative change are seen. There is severe central canal stenosis and marked narrowing in both subarticular recesses. Moderate to moderately severe foraminal narrowing is worse on the right. The central canal appears markedly narrowed on the prior CT. L4-5: Ligamentum flavum  thickening, large broad-based disc bulge and moderate bilateral facet degenerative disease are identified. There is severe central canal and bilateral lateral recess narrowing. Moderately severe bilateral foraminal narrowing is present. The central canal appears severely narrowed on the prior CT. L5-S1: There is moderate to advanced facet degenerative change, worse on the left. Disc bulge with a superimposed down turning right lateral recess protrusion are seen. Moderately severe central canal stenosis is present with marked narrowing of both lateral recesses, worse on the right where the patient's protrusion compresses the descending right S1 root. Moderately severe to severe bilateral foraminal narrowing appears worse on the right. IMPRESSION: No acute abnormality. Congenitally narrow central spinal canal due to short pedicle length. Severe central canal stenosis and marked narrowing in the subarticular recesses at L3-4. Moderate to moderately severe foraminal narrowing at this level appears worse on the right. Severe central canal and bilateral subarticular recess stenosis at L4-5. Moderately severe bilateral foraminal narrowing is also identified. Moderately severe central canal stenosis and marked narrowing of both subarticular recesses at L5-S1. Subarticular recess stenosis is worse on the right where there is a down turning disc protrusion. Moderately severe to severe bilateral foraminal narrowing at this level is worse on the right. Electronically Signed   By: Inge Rise M.D.   On: 08/28/2018 09:59   EKG: Independently reviewed. NSR with LAFB, some artifact, no ischemic changes.  Assessment/Plan Principal Problem:   Acute CVA (cerebrovascular accident) [facility]) Active Problems:   Reflux esophagitis   Hypertension   Seizure ([place])   Hyperlipidemia   Alcoholism in remission ([place])   Weakness   Subcentimeter acute infarct in the right precentral gyrus: On background CVD with also noted remote  right thalamic lacunar infarct, chronic volume loss w/ventriculomegaly. Also suspect an element of cognitive impairment residually. No sensory symptoms. Presented outside window for tPA. Subsequent deficit in LLE.  - MRA negative, will not order carotid U/S at this time - 2D echocardiogram  - PT/OT/SLP: Early mobilization - Permissive HTN: prn hydralazine - Frequent neuro checks, consider STAT repeat imaging if exam changes. - Last LDL 77, continue augmented statin and recheck FLP in AM - Check HbA1c, last in this system was 2018, 5.8%. - NPO until swallow screen, then advance to heart healthy diet - Anti-platelet: Start full dose aspirin for now, previously on 81mg , defer plan to neurology.   Spinal stenosis, degenerative joint disease: No acute findings on lumbar spine MRI, though does have particularly L3-4 and L4-5 central stenosis. There are no symptoms currently associated with this.  - Recommend outpatient  follow up  Right common iliac artery aneurysm: Incidentally noted during work up for prostate CA. 2.2cm stable on serial CT scans. No right leg symptoms of ischemia, has been under surveillance with vascular surgery, last seen by Dr. Donnetta Hutching Nov 2019. - Repeat U/S surveillance Nov 2020.  History of alcohol abuse: Reports only drinking a couple shots of whiskey per week. No evidence of withdrawal at this time.  - Folate, thiamine, hold off on CIWA for right now.   HTN:  - Hold HCTZ for permissive HTN for now  Hyperlipidemia:  - Continue statin, will augment to lipitor 80mg  empirically for now.  Partial seizure disorder:  - Continue home keppra  Prostate CA, BPH with LUTS: Under care of Dr. Jeffie Pollock. Adenopathy resolved on recent CT scan. - Continue flomax  Liver lesion: 70mm subcapsular hypervascular indeterminate lesion incidentally noted on CT 06/20/2018.  - GI referral placed by PCP.  GERD, gastritis, upper GI bleed from ?esophageal ulcer:  - Continue PPI  DVT prophylaxis:  Lovenox  Code Status: Full  Family Communication: Daughter at bedside Disposition Plan: [place] home once stroke work up completed Consults called: Neurology, Dr. Merlene Laughter  Admission status: Inpatient  The appropriate admission status for this patient is INPATIENT. Inpatient status is judged to be reasonable and necessary in order to provide the required intensity of service to ensure the patient's safety. The patient's presenting symptoms, physical exam findings, and initial radiographic and laboratory data in the context of their chronic comorbidities is felt to place them at high risk for further clinical deterioration. Furthermore, it is not anticipated that the patient will be medically stable for discharge from the hospital within 2 midnights of admission. The following factors support the admission status of inpatient.    The patient's presenting symptoms include left leg weakness, gait instability.  The worrisome physical exam findings include left leg weakness, incoordination, .  The initial radiographic and laboratory data are worrisome because of acute and remote strokes.  The chronic co-morbidities include spinal stenosis, HTN, HLD, seizure disorder, GERD, BPH.  Patient requires inpatient status due to high intensity of service, high risk for further deterioration and high frequency of surveillance required.  I certify that at the point of admission it is my clinical judgment that the patient will require inpatient hospital care spanning beyond 2 midnights from the point of admission.      Patrecia Pour, MD [facility] [email or web address] Password [facility] 08/28/2018, 4:19 PM

## 2018-08-28 NOTE — ED Notes (Signed)
Patient transported to MRI 

## 2018-08-28 NOTE — ED Provider Notes (Signed)
Emergency Department Provider Note   I have reviewed the triage vital signs and the nursing notes.   HISTORY  Chief Complaint Extremity Weakness   HPI Joshua Mckee is a 77 y.o. male who is a very poor historian but it sounds like the patient has had 2 to 3 days of difficulty moving his left leg causing difficulty walking.  States yesterday walk a little bit hunched over and able to get around.  Is been using his girlfriend's walker.  No upper extremity symptoms.  Review of records it does appear the patient has a iliac artery aneurysm is being followed.  He states he has a history of stroke but is not sure if he has any deficits from that.  He states a few days ago he was actually walking normally.  No recent illnesses.  No trauma or falls.  No history of the same problems.  No incontinence or saddle anesthesia. No other associated or modifying symptoms.    Past Medical History:  Diagnosis Date  . Aneurysm of right common iliac artery (HCC)    2.2 cm per CT 02-18-2017  . Arthritis   . GERD (gastroesophageal reflux disease)   . Hiatal hernia   . Hiccups    CHRONIC--- MULTIPLE ED VISITS  . History of alcohol abuse   . History of GI bleed    upper GI bleed from esophageal ulcer 01/ 2017 and 03/ 2017 from erosive esophageal gastiris  . 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 History of small bowel obstruction    04-06-2005  s/p  exp. lap. w/ lysis adhesions for partial sbo  . Hyperlipidemia   . Hyperlipidemia   . Hyperplasia of prostate with lower urinary tract symptoms (LUTS)   . Hypertension   . LAFB (left anterior fascicular block)   . Mild cognitive impairment with memory loss    PROBABLE STROKE RESIDUAL BUT POSSIBLE PRIOR TO STROKE  . Partial seizure ([place])    x1  10-15-2016 at ED visit ,  post cva 08-27-2016  . Poor historian   . Prostate cancer [facility]) urologist-  dr Jeffie Pollock  . Schatzki's ring   . Stroke ([place])   . Substance abuse [facility])     Patient Active Problem List   Diagnosis Date Noted  . Overweight (BMI 25.0-29.9) 06/02/2018  . Alcoholism in remission ([place]) 06/13/2017  . Prostate cancer ([place]) 03/06/2017  . Hyperlipidemia 11/30/2016  . Seizure ([place]) 10/15/2016  . History of recent stroke 08/27/2016  . Hypertension 08/27/2016  . Esophageal ulcer 02/27/2016  . GI bleed 12/15/2015  . Reflux esophagitis   . Hiatal hernia     Past Surgical History:  Procedure Laterality Date  . COLONOSCOPY N/A 03/08/2016   Procedure: COLONOSCOPY;  Surgeon: Daneil Dolin, MD;  [facility];  Service: Endoscopy;  Laterality: N/A;  1300-moved to 1400  . ESOPHAGOGASTRODUODENOSCOPY N/A 10/05/2015   Dr. Gala Romney: erosive reflux esophagitis, non-critcal Schatki's ring non-manipulated  . ESOPHAGOGASTRODUODENOSCOPY N/A 12/15/2015   Dr. Rourk:non bleeding esophageal ulcer/small HH  . ESOPHAGOGASTRODUODENOSCOPY N/A 03/08/2016   Procedure: ESOPHAGOGASTRODUODENOSCOPY (EGD);  Surgeon: Daneil Dolin, MD;  Location: [facility];  Service: Endoscopy;  Laterality: N/A;  . EXPLORATORY LAPAROTOMY W/ ADHESIOLYSIS  04/06/2005   partial SBO  . ORCHIECTOMY Bilateral 03/05/2017   Procedure: ORCHIECTOMY BILATERAL;  Surgeon: Irine Seal,  MD;  Location: [facility];  Service: Urology;  Laterality: Bilateral;  . PROSTATE BIOPSY N/A 03/05/2017   Procedure: BIOPSY TRANSRECTAL ULTRASONIC PROSTATE (TUBP);  Surgeon: Irine Seal, MD;  Location: [facility];  Service: Urology;  Laterality: N/A;  . TRANSTHORACIC ECHOCARDIOGRAM  08/28/2016   mild to moderate LVH, ef 55-60%/  mild AR and TR/  trivial MR    Current Outpatient Rx  . Order #: [id number] Class: No Print  . Order #: [id number] Class: Normal  . Order #: [id number] Class: Normal  . Order #: [id number] Class: Normal  . Order  #: [id number] Class: Normal  . Order #: [id number] Class: Normal    Allergies Patient has no known allergies.  Family History  Problem Relation Age of Onset  . Colon cancer Neg Hx     Social History Social History   Tobacco Use  . Smoking status: Never Smoker  . Smokeless tobacco: Never Used  Substance Use Topics  . Alcohol use: No    Comment: hx alcohol abuse -- last drink 12/ 2017   . Drug use: No    Review of Systems  All other systems negative except as documented in the HPI. All pertinent positives and negatives as reviewed in the HPI. ____________________________________________   PHYSICAL EXAM:  VITAL SIGNS: ED Triage Vitals  Enc Vitals Group     BP 08/28/18 0548 128/89     Pulse Rate 08/28/18 0548 87     Resp 08/28/18 0548 17     Temp 08/28/18 0548 98.1 F (36.7 C)     Temp Source 08/28/18 0548 Oral     SpO2 08/28/18 0548 98 %     Weight 08/28/18 0545 162 lb (73.5 kg)     Height 08/28/18 0545 5\' 9"  (1.753 m)    Constitutional: Alert and oriented. Well appearing and in no acute distress. Eyes: Conjunctivae are normal. PERRL. EOMI. Head: Atraumatic. Nose: No congestion/rhinnorhea. Mouth/Throat: Mucous membranes are moist.  Oropharynx non-erythematous. Neck: No stridor.  No meningeal signs.   Cardiovascular: Normal rate, regular rhythm. Good peripheral circulation. Grossly normal heart sounds.   Respiratory: Normal respiratory effort.  No retractions. Lungs CTAB. Gastrointestinal: Soft and nontender. No distention.  Musculoskeletal: No lower extremity tenderness nor edema. No gross deformities of extremities.  No back tenderness, deformity. Neurologic:  Normal speech and language. Normal speech. Able to raise eyebrows.  Able to smile symmetrically and blowout cheeks.  Sensation in all 3 distributions of facial nerve.  His grip strength is normal.  Bicep tendon strength is normal and symmetric.  He has sensation to distal extremities.  He has normal and  symmetric plantar/dorsiflexion of both feet.  When ambulating patient has difficulty lifting his left leg off the ground.  Pain. Skin:  Skin is warm, dry and intact. No rash noted.   ____________________________________________   LABS (all labs ordered are listed, but only abnormal results are displayed)  Labs Reviewed  CBC - Abnormal; Notable for the following components:      Result Value   Hemoglobin 12.9 (*)    All other components within normal limits  COMPREHENSIVE METABOLIC PANEL - Abnormal; Notable for the following components:   Sodium 134 (*)    Glucose, Bld 100 (*)    Calcium 8.7 (*)    AST 62 (*)    ALT 65 (*)    All other components within normal limits  URINALYSIS, ROUTINE W REFLEX MICROSCOPIC - Abnormal; Notable for the following components:   Hgb urine dipstick SMALL (*)  All other components within normal limits  I-STAT CHEM 8, ED - Abnormal; Notable for the following components:   Chloride 96 (*)    All other components within normal limits  ETHANOL  PROTIME-INR  APTT  DIFFERENTIAL  RAPID URINE DRUG SCREEN, HOSP PERFORMED  HEMOGLOBIN A1C  LIPID PANEL  I-STAT TROPONIN, ED   ____________________________________________  EKG   EKG Interpretation  Date/Time:    Ventricular Rate:    PR Interval:    QRS Duration:   QT Interval:    QTC Calculation:   R Axis:     Text Interpretation:         ____________________________________________  RADIOLOGY  No results found.  ____________________________________________   PROCEDURES  Procedure(s) performed:   Procedures   ____________________________________________   INITIAL IMPRESSION / ASSESSMENT AND PLAN / ED COURSE  Concern for possible stroke a few days ago with his left leg weakness and difficulty walking.  He does not really have any pain even when ambulating around.  On review of records he does have some aneurysm but has a normal pulse in the foot . Will workup appropriately.    Care transferred pending CT. If negative, would consider admission vs MRI to rule out stroke.      Pertinent labs & imaging results that were available during my care of the patient were reviewed by me and considered in my medical decision making (see chart for details).  ____________________________________________  FINAL CLINICAL IMPRESSION(S) / ED DIAGNOSES  Final diagnoses:  None     MEDICATIONS GIVEN DURING THIS VISIT:  Medications - No data to display   NEW OUTPATIENT MEDICATIONS STARTED DURING THIS VISIT:  New Prescriptions   No medications on file    Note:  This note was prepared with assistance of Dragon voice recognition software. Occasional wrong-word or sound-a-like substitutions may have occurred due to the inherent limitations of voice recognition software.   Merrily Pew, MD 08/28/18 [phone]

## 2018-08-28 NOTE — ED Triage Notes (Signed)
Pt c/o lower back pain and states it is hard to walk due to pain.

## 2018-08-28 NOTE — ED Provider Notes (Signed)
MRI shows acute infarct in the right precentral gyrus.  Patient will be admitted to medicine for further treatment   [place] Ferguson, MD 08/28/18 1110

## 2018-08-28 NOTE — Consult Note (Signed)
[place] A. Merlene Laughter, MD     [email or web address]          Joshua Mckee is an 77 y.o. male.   ASSESSMENT/PLAN: 1.  Acute left leg monoparesis with imaging showing a right frontal infarct.  This is a cortical embolic appearing infarct.  Consequently, I am suspicious for the patient having possible paroxysmal atrial fibrillation or other embolic causes for the stroke.  We will follow-up the echocardiography.  This is unrevealing, a 30-day event monitor is recommended.  I would suggest continuation of his increased dose of aspirin at 325.  Continue with statin medication.  2.  Apparent epilepsy recently diagnosed a year ago and has responded well to [place].  3.  Cognitive impairment:      The patient is 77 year old right-handed black male who presents with a 2-day history of acute left leg weakness.  The patient typically ambulates with a cane but he was not able to walk around without having to be helped significantly.  The patient decided to seek medical attention.  He denies dysarthria, dysphasia or involvement of the left upper extremity.  There is no loss of consciousness, headaches, and chest pain or shortness of breath.  He seemed to have some difficulty with comprehending either due to impaired hearing or her mild cognitive impairment.  Patient tells me that he does have a history of seizures was started last year.  He tells me he was placed on seizure medication and has done well with this.  He reports the medication appears to be working and he denies significant side effects.  The review of systems otherwise negative.  GENERAL: This is a very pleasant male who appears about 5 to 100 years younger than his stated age.  HEENT: No trauma appreciated neck is supple.  ABDOMEN: soft  EXTREMITIES: No edema; there is marked arthritic changes of the hands and feet.  BACK: Normal  SKIN: Normal by inspection.    MENTAL STATUS: Alert and oriented -including  orientation to his age and the month. Speech, language (fluency, naming are 5/5 objects and comprehension are relatively intact) and cognition are generally intact although again he had some difficulty with things having to be repeated.  One wonders if his hearing is the reason why this is.  There may be some mild cognitive impairment however. Judgment and insight normal.   CRANIAL NERVES: Pupils are equal, round and reactive to light and accomodation; extra ocular movements are full, there is no significant nystagmus; visual fields are full; upper and lower facial muscles revealed mild ptosis on the right otherwise, normal in strength and symmetric, there is no flattening of the nasolabial folds; tongue is midline; uvula is midline; shoulder elevation is normal.  MOTOR: There is markedly increased tone involving the left leg.  There is slight increased tone involving the right leg.  Bulk and strength are normal however.  The upper extremities shows normal tone, bulk and strength.  There is no drift of the upper or lower extremities.    COORDINATION: Left finger to nose is normal, right finger to nose is normal, No rest tremor; no intention tremor; no postural tremor; no bradykinesia.  REFLEXES: Deep tendon reflexes are symmetrical and normal. Plantar reflexes are flexor bilaterally.   SENSATION: Normal to light touch.  No neglect is appreciated.  NIH stroke scale 1.   Blood pressure 128/89, pulse 67, temperature 97.9 F (36.6 C), temperature source Oral, resp. rate 18, height 5\' 9"  (1.753 m), weight 77.2 kg, SpO2  100 %.  Past Medical History:  Diagnosis Date  . Aneurysm of right common iliac artery (HCC)    2.2 cm per CT 02-18-2017  . Arthritis   . GERD (gastroesophageal reflux disease)   . Hiatal hernia   . Hiccups    CHRONIC--- MULTIPLE ED VISITS  . History of alcohol abuse   . History of GI bleed    upper GI bleed from esophageal ulcer 01/ 2017 and 03/ 2017 from erosive esophageal  gastiris  . 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 History of small bowel obstruction    04-06-2005  s/p  exp. lap. w/ lysis adhesions for partial sbo  . Hyperlipidemia   . Hyperlipidemia   . Hyperplasia of prostate with lower urinary tract symptoms (LUTS)   . Hypertension   . LAFB (left anterior fascicular block)   . Mild cognitive impairment with memory loss    PROBABLE STROKE RESIDUAL BUT POSSIBLE PRIOR TO STROKE  . Partial seizure (Joshua Mckee)    x1  10-15-2016 at ED visit , post cva 08-27-2016  . Poor historian   . Prostate cancer [facility]) urologist-  dr Joshua Mckee  . Schatzki's ring   . Stroke ([place])   . Substance abuse [facility])     Past Surgical History:  Procedure Laterality Date  . COLONOSCOPY N/A 03/08/2016   Procedure: COLONOSCOPY;  Surgeon: Daneil Dolin, MD;  [facility];  Service: Endoscopy;  Laterality: N/A;  1300-moved to 1400  . ESOPHAGOGASTRODUODENOSCOPY N/A 10/05/2015   Dr. Gala Romney: erosive reflux esophagitis, non-critcal Schatki's ring non-manipulated  . ESOPHAGOGASTRODUODENOSCOPY N/A 12/15/2015   Dr. Rourk:non bleeding esophageal ulcer/small HH  . ESOPHAGOGASTRODUODENOSCOPY N/A 03/08/2016   Procedure: ESOPHAGOGASTRODUODENOSCOPY (EGD);  Surgeon: Daneil Dolin, MD;  [facility];  Service: Endoscopy;  Laterality: N/A;  . EXPLORATORY LAPAROTOMY W/ ADHESIOLYSIS  04/06/2005   partial SBO  . ORCHIECTOMY Bilateral 03/05/2017   Procedure: ORCHIECTOMY BILATERAL;  Surgeon: Irine Seal, MD;  Location: [facility];  Service: Urology;  Laterality: Bilateral;  . PROSTATE BIOPSY N/A 03/05/2017   Procedure: BIOPSY TRANSRECTAL ULTRASONIC PROSTATE (TUBP);  Surgeon: Irine Seal, MD;  Location: [facility];  Service: Urology;  Laterality: N/A;  . TRANSTHORACIC  ECHOCARDIOGRAM  08/28/2016   mild to moderate LVH, ef 55-60%/  mild AR and TR/  trivial MR    Family History  Problem Relation Age of Onset  . Colon cancer Neg Hx     Social History:  reports that he has never smoked. He has never used smokeless tobacco. He reports that he does not drink alcohol or use drugs.  Allergies: No Known Allergies  Medications: Prior to Admission medications   Medication Sig Start Date End Date Taking? Authorizing Provider  aspirin EC 81 MG EC tablet Take 1 tablet (81 mg total) by mouth daily. 08/30/16  Yes Isaac Bliss, Rayford Halsted, MD  atorvastatin (LIPITOR) 40 MG tablet TAKE ONE TABLET BY MOUTH DAILY AT 6 PM. 06/02/18  Yes Dettinger, Fransisca Kaufmann, MD  hydrochlorothiazide (HYDRODIURIL) 25 MG tablet Take 1 tablet (25 mg total) by mouth daily. For blood pressure 06/02/18  Yes Dettinger, Fransisca Kaufmann, MD  levETIRAcetam (KEPPRA) 500 MG tablet TAKE ONE TABLET BY MOUTH 2 TIMES A DAY. 06/02/18  Yes Dettinger, Fransisca Kaufmann, MD  pantoprazole (PROTONIX) 40 MG tablet TAKE (1)  TABLET BY MOUTH ONCE DAILY. 06/02/18  Yes Dettinger, Fransisca Kaufmann, MD  tamsulosin (FLOMAX) 0.4 MG CAPS capsule Take 1 capsule (0.4 mg total) by mouth daily. 06/02/18  Yes Dettinger, Fransisca Kaufmann, MD    Scheduled Meds: . aspirin  325 mg Oral Daily  . atorvastatin  80 mg Oral q1800  . enoxaparin (LOVENOX) injection  40 mg Subcutaneous Q24H  . folic acid  1 mg Oral Daily  . levETIRAcetam  500 mg Oral BID  . pantoprazole  40 mg Oral Daily  . tamsulosin  0.4 mg Oral Daily  . thiamine  100 mg Oral Daily   Continuous Infusions: PRN Meds:.acetaminophen **OR** acetaminophen (TYLENOL) oral liquid 160 mg/5 mL **OR** acetaminophen, hydrALAZINE, senna-docusate     Results for orders placed or performed during the hospital encounter of 08/28/18 (from the past 48 hour(s))  Ethanol     Status: None   Collection Time: 08/28/18  6:03 AM  Result Value Ref Range   Alcohol, Ethyl (B) <10 <10 mg/dL    Comment: (NOTE) Lowest  detectable limit for serum alcohol is 10 mg/dL. For medical purposes only. Performed at [facility], [address], [place], [place] [address]   Protime-INR     Status: None   Collection Time: 08/28/18  6:03 AM  Result Value Ref Range   Prothrombin Time 12.8 11.4 - 15.2 seconds   INR 0.97     Comment: Performed at [facility], [address], [place], [place] [address]  APTT     Status: None   Collection Time: 08/28/18  6:03 AM  Result Value Ref Range   aPTT 28 24 - 36 seconds    Comment: Performed at [facility], [address] [place], [place] [address]  CBC     Status: Abnormal   Collection Time: 08/28/18  6:03 AM  Result Value Ref Range   WBC 6.1 4.0 - 10.5 K/uL   RBC 4.62 4.22 - 5.81 MIL/uL   Hemoglobin 12.9 (L) 13.0 - 17.0 g/dL   HCT 40.3 39.0 - 52.0 %   MCV 87.2 80.0 - 100.0 fL   MCH 27.9 26.0 - 34.0 pg   MCHC 32.0 30.0 - 36.0 g/dL   RDW 14.3 11.5 - 15.5 %   Platelets 189 150 - 400 K/uL   nRBC 0.0 0.0 - 0.2 %    Comment: Performed at [facility], [address], [place], [place] [address]  Differential     Status: None   Collection Time: 08/28/18  6:03 AM  Result Value Ref Range   Neutrophils Relative % 59 %   Neutro Abs 3.6 1.7 - 7.7 K/uL   Lymphocytes Relative 25 %   Lymphs Abs 1.5 0.7 - 4.0 K/uL   Monocytes Relative 11 %   Monocytes Absolute 0.7 0.1 - 1.0 K/uL   Eosinophils Relative 4 %   Eosinophils Absolute 0.3 0.0 - 0.5 K/uL   Basophils Relative 1 %   Basophils Absolute 0.0 0.0 - 0.1 K/uL   Immature Granulocytes 0 %   Abs Immature Granulocytes 0.01 0.00 - 0.07 K/uL    Comment: Performed at [facility], [address] [place],  [address]  Comprehensive metabolic panel     Status: Abnormal   Collection Time: 08/28/18  6:03 AM  Result Value Ref Range   Sodium 134 (L) 135 - 145 mmol/L   Potassium 3.5 3.5 - 5.1 mmol/L   Chloride 99 98 - 111 mmol/L   CO2 29 22 - 32 mmol/L  Glucose, Bld 100 (H) 70 - 99 mg/dL   BUN 19 8 - 23 mg/dL   Creatinine,  Ser 0.82 0.61 - 1.24 mg/dL   Calcium 8.7 (L) 8.9 - 10.3 mg/dL   Total Protein 7.5 6.5 - 8.1 g/dL   Albumin 3.7 3.5 - 5.0 g/dL   AST 62 (H) 15 - 41 U/L   ALT 65 (H) 0 - 44 U/L   Alkaline Phosphatase 83 38 - 126 U/L   Total Bilirubin 0.9 0.3 - 1.2 mg/dL   GFR calc non Af Amer >60 >60 mL/min   GFR calc Af Amer >60 >60 mL/min   Anion gap 6 5 - 15    Comment: Performed at [facility], [address] [place], [place] [address]  Urine rapid drug screen (hosp performed)     Status: None   Collection Time: 08/28/18  6:03 AM  Result Value Ref Range   Opiates NONE DETECTED NONE DETECTED   Cocaine NONE DETECTED NONE DETECTED   Benzodiazepines NONE DETECTED NONE DETECTED   Amphetamines NONE DETECTED NONE DETECTED   Tetrahydrocannabinol NONE DETECTED NONE DETECTED   Barbiturates NONE DETECTED NONE DETECTED    Comment: (NOTE) DRUG SCREEN FOR MEDICAL PURPOSES ONLY.  IF CONFIRMATION IS NEEDED FOR ANY PURPOSE, NOTIFY LAB WITHIN 5 DAYS. LOWEST DETECTABLE LIMITS FOR URINE DRUG SCREEN Drug Class                     Cutoff (ng/mL) Amphetamine and metabolites    1000 Barbiturate and metabolites    200 Benzodiazepine                 681 Tricyclics and metabolites     300 Opiates and metabolites        300 Cocaine and metabolites        300 THC                            50 Performed at [facility], [address] [place], [place] [address]   Urinalysis, Routine w reflex microscopic     Status: Abnormal   Collection Time: 08/28/18  6:03 AM  Result Value Ref Range   Color, Urine YELLOW YELLOW   APPearance CLEAR CLEAR   Specific Gravity, Urine 1.017 1.005 - 1.030   pH 5.0 5.0 - 8.0   Glucose, UA NEGATIVE NEGATIVE mg/dL   Hgb urine dipstick SMALL (A) NEGATIVE   Bilirubin Urine NEGATIVE NEGATIVE   Ketones, ur NEGATIVE NEGATIVE mg/dL   Protein, ur NEGATIVE NEGATIVE mg/dL   Nitrite NEGATIVE NEGATIVE   Leukocytes, UA NEGATIVE NEGATIVE   RBC / HPF 0-5 0 - 5 RBC/hpf   WBC, UA 0-5 0 - 5 WBC/hpf     Bacteria, UA NONE SEEN NONE SEEN   Mucus PRESENT     Comment: Performed at [facility], [address] [place], [place] [address]  I-stat troponin, ED     Status: None   Collection Time: 08/28/18  6:17 AM  Result Value Ref Range   Troponin i, poc 0.02 0.00 - 0.08 ng/mL   Comment 3            Comment: Due to the release kinetics of cTnI, a negative result within the first hours of the onset of symptoms does not rule out myocardial infarction with certainty. If myocardial infarction is still suspected, repeat the test at appropriate intervals.   I-Stat Chem 8, ED  Status: Abnormal   Collection Time: 08/28/18  6:18 AM  Result Value Ref Range   Sodium 137 135 - 145 mmol/L   Potassium 3.5 3.5 - 5.1 mmol/L   Chloride 96 (L) 98 - 111 mmol/L   BUN 19 8 - 23 mg/dL   Creatinine, Ser 0.90 0.61 - 1.24 mg/dL   Glucose, Bld 98 70 - 99 mg/dL   Calcium, Ion 1.16 1.15 - 1.40 mmol/L   TCO2 31 22 - 32 mmol/L   Hemoglobin 13.9 13.0 - 17.0 g/dL   HCT 41.0 39.0 - 52.0 %    Studies/Results:    L SPINE MRI Disc levels:  T11-12: Shallow disc bulge and ligamentum flavum thickening are seen. The central canal is open. Mild to moderate foraminal narrowing is worse on the left.  T12-L1: Minimal disc bulge without stenosis.  L1-2: Shallow disc bulge causes mild central canal narrowing. Mild to moderate facet degenerative change is present. The foramina are open.  L2-3: Shallow disc bulge and mild-to-moderate facet degenerative change. There is mild central canal narrowing. The foramina are open.  L3-4: Broad-based disc bulge, ligamentum flavum thickening and moderate facet degenerative change are seen. There is severe central canal stenosis and marked narrowing in both subarticular recesses. Moderate to moderately severe foraminal narrowing is worse on the right. The central canal appears markedly narrowed on the prior CT.  L4-5: Ligamentum flavum thickening, large broad-based  disc bulge and moderate bilateral facet degenerative disease are identified. There is severe central canal and bilateral lateral recess narrowing. Moderately severe bilateral foraminal narrowing is present. The central canal appears severely narrowed on the prior CT.  L5-S1: There is moderate to advanced facet degenerative change, worse on the left. Disc bulge with a superimposed down turning right lateral recess protrusion are seen. Moderately severe central canal stenosis is present with marked narrowing of both lateral recesses, worse on the right where the patient's protrusion compresses the descending right S1 root. Moderately severe to severe bilateral foraminal narrowing appears worse on the right.  IMPRESSION: No acute abnormality.  Congenitally narrow central spinal canal due to short pedicle length.  Severe central canal stenosis and marked narrowing in the subarticular recesses at L3-4. Moderate to moderately severe foraminal narrowing at this level appears worse on the right.  Severe central canal and bilateral subarticular recess stenosis at L4-5. Moderately severe bilateral foraminal narrowing is also identified.  Moderately severe central canal stenosis and marked narrowing of both subarticular recesses at L5-S1. Subarticular recess stenosis is worse on the right where there is a down turning disc protrusion. Moderately severe to severe bilateral foraminal narrowing at this level is worse on the right.        BRAIN MRI MRA FINDINGS: MRI HEAD FINDINGS  Brain: 7 mm acute infarct along the right precentral gyrus in the region of left hand function.  Remote lacunar infarct in the right thalamus. Chronic lateral ventriculomegaly without obstructive finding. There is no callosal angle narrowing or disproportionate sulcal crowding, favor ex vacuo dilatation.  No acute hemorrhage, collection, or masslike finding.  Vascular: Major flow voids are  preserved.  Arterial findings below.  Skull and upper cervical spine: Negative for marrow lesion  Sinuses/Orbits: No significant finding  MRA HEAD FINDINGS  Symmetric carotid and vertebral arteries. No branch occlusion, beading, or aneurysm.  IMPRESSION: Brain MRI:  1. Subcentimeter acute infarct in the right precentral gyrus. 2. Chronic volume loss and ventriculomegaly. 3. Remote lacunar infarct in the right thalamus.  Intracranial MRA:  Negative.  THE BRAIN MRI AND MRA REVIEWED IN PERSON.  There is a small right frontal cortical infarct seen on 2 cuts.  This seems to be is small cortical embolic type event.  This is seen with increased signal on DWI.  No hemorrhages appreciated on SWI.  There is rather surprisingly no white matter lesions.  There is marked global atrophy associated with ventriculomegaly.  There is a remote infarct associated with encephalomalacia involving the right thalamus.  MRA shows no intracranial occlusive disease.       ECHO -- p        Joshua Mckee A. Merlene Laughter, M.D.  Diplomate, Tax adviser of Psychiatry and Neurology ( Neurology). 08/28/2018, 5:39 PM

## 2018-08-29 ENCOUNTER — Inpatient Hospital Stay (HOSPITAL_COMMUNITY): Payer: Medicare Other

## 2018-08-29 DIAGNOSIS — I340 Nonrheumatic mitral (valve) insufficiency: Secondary | ICD-10-CM

## 2018-08-29 DIAGNOSIS — I361 Nonrheumatic tricuspid (valve) insufficiency: Secondary | ICD-10-CM

## 2018-08-29 LAB — LIPID PANEL
CHOL/HDL RATIO: 2.9 ratio
CHOLESTEROL: 153 mg/dL (ref 0–200)
HDL: 52 mg/dL (ref >40)
LDL Cholesterol: 90 mg/dL (ref 0–99)
Triglycerides: 53 mg/dL (ref <150)
VLDL: 11 mg/dL (ref 0–40)

## 2018-08-29 LAB — HEMOGLOBIN A1C
Hgb A1c MFr Bld: 5.7 % — ABNORMAL HIGH (ref 4.8–5.6)
Mean Plasma Glucose: 116.89 mg/dL

## 2018-08-29 LAB — ECHOCARDIOGRAM COMPLETE
Height: 69 in
Weight: 2724.8 oz

## 2018-08-29 MED ORDER — ASPIRIN 325 MG PO TABS: 325.0000 mg | ORAL_TABLET | Freq: Every day | ORAL | 0 refills | Status: DC

## 2018-08-29 MED ORDER — ATORVASTATIN CALCIUM 80 MG PO TABS: 80.0000 mg | ORAL_TABLET | Freq: Every day | ORAL | 0 refills | Status: DC

## 2018-08-29 NOTE — Progress Notes (Signed)
*  PRELIMINARY RESULTS* Echocardiogram 2D Echocardiogram has been performed.  Joshua Mckee 08/29/2018, 1:53 PM

## 2018-08-29 NOTE — Evaluation (Signed)
Physical Therapy Evaluation Patient Details Name: Joshua Mckee MRN: [id number] DOB: 23-Jul-1941 Today's Date: 08/29/2018   History of Present Illness  Joshua Mckee is a 77 y.o. male with a history of HTN, HLD, GERD, BPH, right common iliac artery aneurysm, and seizure disorder who presented from home by EMS for inability to walk due to left leg weakness. He is a poor historian but reports a few days of weakness in the left leg, difficult to lift and affecting gait. He usually uses a cane but had to start using his girlfriend's walker for increasing, constant, moderate weakness in the left leg but this morning he was unable to even get up so asked girlfriend to call EMS.     Clinical Impression  Patient unsteady on feet having to lean on nearby objects for support during transfers and ambulation, requires use of SPC for safety and functioning near baseline per patient.  Patient demonstrated good return for transferring to commode in bathroom and ambulating back to room to transfer to chair without hands on assistance.  Patient tolerated sitting up in chair after therapy - RN notified..  Patient will benefit from continued physical therapy in hospital and recommended venue below to increase strength, balance, endurance for safe ADLs and gait.    Follow Up Recommendations Home health PT;Supervision - Intermittent    Equipment Recommendations  None recommended by PT    Recommendations for Other Services       Precautions / Restrictions Precautions Precautions: Fall Restrictions Weight Bearing Restrictions: No      Mobility  Bed Mobility Overal bed mobility: Modified Independent                Transfers Overall transfer level: Modified independent Equipment used: Straight cane             General transfer comment: increased time, had to lean on nearby objects when not using cane  Ambulation/Gait Ambulation/Gait assistance: Supervision Gait Distance (Feet): 100  Feet Assistive device: Straight cane Gait Pattern/deviations: Decreased step length - left;Decreased stride length Gait velocity: decreased   General Gait Details: unsteady labored, slightly shakey cadence, frequently has to lean on nearby objects for support requiring use of SPC for safety, no loss of balance, occasional knees buckle, but patient able to maintain balance without assist  Stairs            Wheelchair Mobility    Modified Rankin (Stroke Patients Only)       Balance Overall balance assessment: Needs assistance Sitting-balance support: Feet supported;No upper extremity supported Sitting balance-Leahy Scale: Good     Standing balance support: During functional activity;No upper extremity supported Standing balance-Leahy Scale: Poor Standing balance comment: fair/poor without AD, fair using SPC                             Pertinent Vitals/Pain Pain Assessment: No/denies pain    Home Living Family/patient expects to be discharged to:: Private residence Living Arrangements: Spouse/significant other Available Help at Discharge: Family;Available PRN/intermittently Type of Home: Apartment Home Access: Level entry     Home Layout: One level Home Equipment: Walker - 2 wheels;Cane - single point;Bedside commode;Shower seat      Prior Function Level of Independence: Independent with assistive device(s)         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, drives     Hand Dominance   Dominant Hand: Right    Extremity/Trunk Assessment   Upper Extremity Assessment Upper  Extremity Assessment: Defer to OT evaluation    Lower Extremity Assessment Lower Extremity Assessment: Overall WFL for tasks assessed;LLE deficits/detail LLE Deficits / Details: grossly 4/5    Cervical / Trunk Assessment Cervical / Trunk Assessment: Kyphotic  Communication   Communication: No difficulties  Cognition Arousal/Alertness: Awake/alert Behavior During  Therapy: WFL for tasks assessed/performed Overall Cognitive Status: Within Functional Limits for tasks assessed                                 General Comments: pt impulsive at times during evaluation      General Comments      Exercises     Assessment/Plan    PT Assessment Patient needs continued PT services  PT Problem List Decreased strength;Decreased activity tolerance;Decreased balance;Decreased mobility       PT Treatment Interventions Gait training;Stair training;Functional mobility training;Therapeutic activities;Patient/family education;Therapeutic exercise    PT Goals (Current goals can be found in the Care Plan section)  Acute Rehab PT Goals Patient Stated Goal: return home with his girlfriend to assist PT Goal Formulation: With patient Time For Goal Achievement: 09/05/18 Potential to Achieve Goals: Good    Frequency 7X/week   Barriers to discharge        Co-evaluation PT/OT/SLP Co-Evaluation/Treatment: Yes Reason for Co-Treatment: Necessary to address cognition/behavior during functional activity;For patient/therapist safety PT goals addressed during session: Mobility/safety with mobility;Balance;Proper use of DME;Strengthening/ROM         AM-PAC PT "6 Clicks" Mobility  Outcome Measure Help needed turning from your back to your side while in a flat bed without using bedrails?: None Help needed moving from lying on your back to sitting on the side of a flat bed without using bedrails?: None Help needed moving to and from a bed to a chair (including a wheelchair)?: None Help needed standing up from a chair using your arms (e.g., wheelchair or bedside chair)?: A Little Help needed to walk in hospital room?: A Little Help needed climbing 3-5 steps with a railing? : A Little 6 Click Score: 21    End of Session Equipment Utilized During Treatment: Gait belt Activity Tolerance: Patient tolerated treatment well;Patient limited by  fatigue Patient left: in chair;with call bell/phone within reach Nurse Communication: Mobility status PT Visit Diagnosis: Unsteadiness on feet (R26.81);Other abnormalities of gait and mobility (R26.89);Muscle weakness (generalized) (M62.81)    Time: 4967-5916 PT Time Calculation (min) (ACUTE ONLY): 31 min   Charges:   PT Evaluation $PT Eval Moderate Complexity: 1 Mod PT Treatments $Therapeutic Activity: 23-37 mins        2:23 PM, 08/29/18 [name], MPT Physical Therapist with [facility] 336 [phone] office [phone] mobile phone

## 2018-08-29 NOTE — Care Management (Signed)
iewing 1 - 15 of 15 results       Results list table  [place] InformationSorted ascending, Select to sort descending  Quality of Patient Care RatingSelect to sort ascending or descending . A rating of 4 or more stars means the agency performed better than most other agencies on selected measures. . A rating of 3 to 3 stars means that the agency performed about the same as most agencies. . A rating of fewer than 3 stars means that the agency's performance was below the average of other agencies on selected measures." class="info" href="javascript:void(0)". A rating of 4 or more stars means the agency performed better than most other agencies on selected measures. . A rating of 3 to 3 stars means that the agency performed about the same as most agencies. . A rating of fewer than 3 stars means that the agency's performance was below the average of other agencies on selected measures." class="info" href="javascript:void(0)"  Patient Survey Summary RatingSelect to sort ascending or descending    Home Health Agency InformationSorted ascending, Select to sort descending  Quality of Patient Care RatingSelect to sort ascending or descending . A rating of 4 or more stars means the agency performed better than most other agencies on selected measures. . A rating of 3 to 3 stars means that the agency performed about the same as most agencies. . A rating of fewer than 3 stars means that the agency's performance was below the average of other agencies on selected measures." class="info" href="javascript:void(0)". A rating of 4 or more stars means the agency performed better than most other agencies on selected measures. . A rating of 3 to 3 stars means that the agency performed about the same as most agencies. . A rating of fewer than 3 stars means that the agency's performance was below the average of other agencies on selected measures." class="info" href="javascript:void(0)"  Patient Survey  Summary RatingSelect to sort ascending or descending    [facility] [phone]   [place] to my Favorites  Quality of Patient Care Rating4 out of 5 stars Patient Survey Summary Rating4 out of [place] [phone]   [place] to my Favorites  Quality of Patient Care Rating3 out of 5 stars Patient Survey Summary Rating5 out of [place] [phone]   [place] to my Favorites  Quality of Patient Care Rating3 out of 5 stars Patient Survey Summary Rating4 out of [place] [phone]   [place] to my [place]  out of 5 stars Patient Survey Summary Rating4 out of [place] [phone]   [place] to my [place]  out of 5 stars Patient Survey Summary Rating4 out of [place] [phone]   [place] to my Favorites  Quality of Patient Care Rating4  out of 5 stars Patient Survey Summary Rating4 out of [place] [phone]   [place] to my Favorites  Quality of Patient Care Rating4  out of 5 stars Patient Survey Summary Rating4 out of [place] [phone]   [place] to my Favorites  Quality of  Patient Care Rating4 out of 5 stars Patient Survey Summary Rating4 out of 5 stars  [place] AGE [phone]   [place] AGE to my Favorites  Quality of Patient Care Rating3  out of 5 stars Patient Survey Summary Rating4 out of 5 stars  [facility] [place] [phone]   [place] to my Favorites  Quality of Patient Care Rating4 out of 5 stars Patient Survey Summary Rating4 out of 5 stars  [place] [phone]   [place] to my Favorites  Quality of Patient Care Rating3 out of 5 stars Patient Survey Summary Rating4 out of 5 stars  [facility] [phone]   Add [facility] to my Favorites  Quality of Patient Care Rating2  out of 5 stars Patient Survey Summary Rating3 out of [place] [phone]   [place] to my [place]  out of 5 stars Patient Survey Summary Rating3 out of [place] [phone]   [place] to my Favorites  Not Available4 Not [place] [phone]   [place] to my Favorites

## 2018-08-29 NOTE — Plan of Care (Signed)
  Problem: Acute Rehab PT Goals(only PT should resolve) Goal: Pt Will Go Supine/Side To Sit Outcome: Progressing Flowsheets (Taken 08/29/2018 1426) Pt will go Supine/Side to Sit: Independently Goal: Patient Will Transfer Sit To/From Stand Outcome: Progressing Flowsheets (Taken 08/29/2018 1426) Patient will transfer sit to/from stand: with modified independence Goal: Pt Will Transfer Bed To Chair/Chair To Bed Outcome: Progressing Flowsheets (Taken 08/29/2018 1426) Pt will Transfer Bed to Chair/Chair to Bed: with modified independence Goal: Pt Will Ambulate Outcome: Progressing Flowsheets (Taken 08/29/2018 1426) Pt will Ambulate: > 125 feet; with supervision; with cane   2:27 PM, 08/29/18 [name], MPT Physical Therapist with [facility] 336 [phone] office [phone] mobile phone

## 2018-08-29 NOTE — Discharge Summary (Signed)
Physician Discharge Summary  Joshua Mckee ZOX:[id number] DOB: 1940/09/28 DOA: 08/28/2018  PCP: Chipper Herb, MD  Admit date: 08/28/2018 Discharge date: 08/29/2018  Admitted From: Home Disposition: Home   Recommendations for Outpatient Follow-up:  1. Follow up with PCP in 1-2 weeks 2. Follow up with neurology in 6-8 weeks. Augmented aspirin 81mg  to 325mg , augmented atorvastatin 40mg  to 80mg , and arranged for 30-day cardiac monitoring.  Home Health: PT, OT, SLP Equipment/Devices: None new Discharge Condition: Stable CODE STATUS: Full Diet recommendation: Heart healthy  Brief/Interim Summary: Joshua Mckee is a 77 y.o. male with a history of HTN, HLD, GERD, BPH, right common iliac artery aneurysm, and seizure disorder who presented from home by EMS for inability to walk due to left leg weakness. He is a poor historian but reports a few days of weakness in the left leg, difficult to lift and affecting gait. He usually uses a cane but had to start using his girlfriend's walker for increasing, constant, moderate weakness in the left leg but this morning he was unable to even get up so asked girlfriend to call EMS.   ED Course: No distress with largely unremarkable labs, negative EtOH, UDS, nonacute CT head, but subsequently MRI demonstrated right prefrontal gyrus stroke thought to explain left leg weakness. MRA showed no significant stenoses.  Hospital Course: Therapy was consulted, recommended continuing at home with PT, OT, speech therapy. Neurology was consulted, recommending 30-day cardiac event monitoring due to suspicion for embolic stroke. Echocardiogram was performed, showing no cardioembolic source.   Discharge Diagnoses:  Principal Problem:   Acute CVA (cerebrovascular accident) [facility]) Active Problems:   Reflux esophagitis   Hypertension   Seizure ([place])   Hyperlipidemia   Alcoholism in remission ([place])   Weakness  Subcentimeter acute infarct in the right precentral gyrus:  On background CVD with also noted remote right thalamic lacunar infarct, chronic volume loss w/ventriculomegaly. Also suspect an element of cognitive impairment residually. No sensory symptoms. Presented outside window for tPA. Subsequent deficit in LLE. MRA negative, though did have history of posterior stroke. Echo showed G1DD, though there is no clinical diastolic heart failure, preserved EF and no embolic source. HbA1c 5.7%.  - Continue home health PT, OT, SLP.  - LDL is 90, will continue augmented/high intensity statin  - Start full dose aspirin, previously on 81mg . - Needs neurology follow up. - High degree of suspicion for embolism for this cortical stroke. Thus, neurology recommends 30-day cardiac event monitoring which was arranged prior to discharge.  Spinal stenosis, degenerative joint disease: No acute findings on lumbar spine MRI, though does have particularly L3-4 and L4-5 central stenosis. There are no symptoms currently associated with this.  - Recommend outpatient follow up  Right common iliac artery aneurysm: Incidentally noted during work up for prostate CA. 2.2cm stable on serial CT scans. No right leg symptoms of ischemia, has been under surveillance with vascular surgery, last seen by Dr. Donnetta Hutching Nov 2019. - Repeat U/S surveillance Nov 2020.  History of alcohol abuse: Reports only drinking a couple shots of whiskey per week. No evidence of withdrawal at this time.   HTN:  - Ok to restart HCTZ.  Hyperlipidemia:  - Continue statin, augmented to lipitor 80mg   Partial seizure disorder:  - Continue home keppra  Prostate CA, BPH with LUTS: Under care of Dr. Jeffie Pollock. Adenopathy resolved on recent CT scan. - Continue flomax  Liver lesion: 58mm subcapsular hypervascular indeterminate lesion incidentally noted on CT 06/20/2018.  - GI referral placed by PCP.  GERD, gastritis, upper GI bleed from ?esophageal ulcer:  - Continue PPI  Discharge Instructions Discharge  Instructions    Diet - low sodium heart healthy   Complete by:  As directed    Discharge instructions   Complete by:  As directed    You were admitted for a stroke which caused left leg weakness. You are stable for discharge with the following recommendations:  - START aspirin 325mg  daily (not just 81mg ) - START lipitor 80mg  daily (up from 40mg ) - Home health physical therapy, occupational therapy, and speech therapy will be arranged prior to discharge. - A cardiac monitor will be arranged to monitor you for an abnormal heart rhythm that may have caused this stroke.  - Follow up with your PCP soon and with neurology in 6-8 weeks.  - Seek medical attention if your symptoms return or you experience new numbness, weakness, or difficulty speaking.   Increase activity slowly   Complete by:  As directed      Allergies as of 08/29/2018   No Known Allergies     Medication List    STOP taking these medications   aspirin 81 MG EC tablet Replaced by:  aspirin 325 MG tablet     TAKE these medications   aspirin 325 MG tablet Take 1 tablet (325 mg total) by mouth daily. Start taking on:  August 30, 2018 Replaces:  aspirin 81 MG EC tablet   atorvastatin 80 MG tablet Commonly known as:  LIPITOR Take 1 tablet (80 mg total) by mouth daily. TAKE ONE TABLET BY MOUTH DAILY AT 6 PM. What changed:    medication strength  how much to take  how to take this  when to take this   hydrochlorothiazide 25 MG tablet Commonly known as:  HYDRODIURIL Take 1 tablet (25 mg total) by mouth daily. For blood pressure   levETIRAcetam 500 MG tablet Commonly known as:  KEPPRA TAKE ONE TABLET BY MOUTH 2 TIMES A DAY.   pantoprazole 40 MG tablet Commonly known as:  PROTONIX TAKE (1) TABLET BY MOUTH ONCE DAILY.   tamsulosin 0.4 MG Caps capsule Commonly known as:  FLOMAX Take 1 capsule (0.4 mg total) by mouth daily.      Follow-up Information    Chipper Herb, MD. Schedule an appointment as  soon as possible for a visit on 09/03/2018.   Specialty:  Family Medicine Why:  3:25 Contact information: [place] [address] [phone]        Phillips Odor, MD. Schedule an appointment as soon as possible for a visit in 2 months.   Specialty:  Neurology Contact information: [address] [place] [address] [phone]          No Known Allergies  Consultations:  Neurology, Dr. Merlene Laughter  Procedures/Studies: Dg Chest 2 View  Result Date: 08/28/2018 CLINICAL DATA:  76 year old male with history of prostate cancer and back pain. EXAM: CHEST - 2 VIEW COMPARISON:  Chest radiograph dated 08/27/2016 FINDINGS: The lungs are clear. There is no pleural effusion or pneumothorax. The cardiac silhouette is within normal limits. There is mild tortuosity of the thoracic aorta. There is degenerative changes of the spine. No acute osseous pathology. IMPRESSION: No active cardiopulmonary disease. Electronically Signed   By: Anner Crete M.D.   On: 08/28/2018 06:45   Dg Lumbar Spine Complete  Result Date: 08/28/2018 CLINICAL DATA:  77 year old male with back pain. EXAM: LUMBAR SPINE - COMPLETE 4+ VIEW COMPARISON:  CT of the  abdomen pelvis dated 06/20/2018 FINDINGS: There is no acute fracture or subluxation of the lumbar spine. There are multilevel degenerative changes with osteophyte. Multilevel facet arthropathy. The soft tissues are unremarkable. IMPRESSION: No acute fracture or subluxation. Electronically Signed   By: Anner Crete M.D.   On: 08/28/2018 06:43   Ct Head Wo Contrast  Result Date: 08/28/2018 CLINICAL DATA:  77 year old male with generalized weakness. EXAM: CT HEAD WITHOUT CONTRAST TECHNIQUE: Contiguous axial images were obtained from the base of the skull through the vertex without intravenous contrast. COMPARISON:  Head CT dated 08/27/2016 FINDINGS: Brain: There is mild age-related atrophy and chronic microvascular ischemic changes.  Stable focal hypodense area lateral to the right thalamus likely related to a small old lacunar infarct. There is dilatation of the ventricles out of proportion with the sulci which may represent central volume loss versus normal pressure hydrocephalus. Clinical correlation is recommended. The degree of ventriculomegaly is similar to prior CT. There is no acute intracranial hemorrhage. No mass effect or midline shift. No extra-axial fluid collection. Vascular: No hyperdense vessel or unexpected calcification. Skull: Normal. Negative for fracture or focal lesion. Sinuses/Orbits: No acute finding. Other: None IMPRESSION: 1. No acute intracranial hemorrhage. 2. Mild age-related atrophy and chronic microvascular ischemic changes. Probable old small right thalamic lacunar infarct 3. Stable ventriculomegaly. Electronically Signed   By: Anner Crete M.D.   On: 08/28/2018 06:54   Mr Jodene Nam Head Wo Contrast  Result Date: 08/28/2018 CLINICAL DATA:  Altered mental status for 2 days. EXAM: MRI HEAD WITHOUT CONTRAST MRA HEAD WITHOUT CONTRAST TECHNIQUE: Multiplanar, multiecho pulse sequences of the brain and surrounding structures were obtained without intravenous contrast. Angiographic images of the head were obtained using MRA technique without contrast. COMPARISON:  Head CT from earlier today.  Brain MRI 10/15/2016 FINDINGS: MRI HEAD FINDINGS Brain: 7 mm acute infarct along the right precentral gyrus in the region of left hand function. Remote lacunar infarct in the right thalamus. Chronic lateral ventriculomegaly without obstructive finding. There is no callosal angle narrowing or disproportionate sulcal crowding, favor ex vacuo dilatation. No acute hemorrhage, collection, or masslike finding. Vascular: Major flow voids are preserved.  Arterial findings below. Skull and upper cervical spine: Negative for marrow lesion Sinuses/Orbits: No significant finding MRA HEAD FINDINGS Symmetric carotid and vertebral arteries. No  branch occlusion, beading, or aneurysm. IMPRESSION: Brain MRI: 1. Subcentimeter acute infarct in the right precentral gyrus. 2. Chronic volume loss and ventriculomegaly. 3. Remote lacunar infarct in the right thalamus. Intracranial MRA: Negative. Electronically Signed   By: Monte Fantasia M.D.   On: 08/28/2018 09:54   Mr Brain Wo Contrast  Result Date: 08/28/2018 CLINICAL DATA:  Altered mental status for 2 days. EXAM: MRI HEAD WITHOUT CONTRAST MRA HEAD WITHOUT CONTRAST TECHNIQUE: Multiplanar, multiecho pulse sequences of the brain and surrounding structures were obtained without intravenous contrast. Angiographic images of the head were obtained using MRA technique without contrast. COMPARISON:  Head CT from earlier today.  Brain MRI 10/15/2016 FINDINGS: MRI HEAD FINDINGS Brain: 7 mm acute infarct along the right precentral gyrus in the region of left hand function. Remote lacunar infarct in the right thalamus. Chronic lateral ventriculomegaly without obstructive finding. There is no callosal angle narrowing or disproportionate sulcal crowding, favor ex vacuo dilatation. No acute hemorrhage, collection, or masslike finding. Vascular: Major flow voids are preserved.  Arterial findings below. Skull and upper cervical spine: Negative for marrow lesion Sinuses/Orbits: No significant finding MRA HEAD FINDINGS Symmetric carotid and vertebral arteries. No branch occlusion, beading,  or aneurysm. IMPRESSION: Brain MRI: 1. Subcentimeter acute infarct in the right precentral gyrus. 2. Chronic volume loss and ventriculomegaly. 3. Remote lacunar infarct in the right thalamus. Intracranial MRA: Negative. Electronically Signed   By: Monte Fantasia M.D.   On: 08/28/2018 09:54   Mr Lumbar Spine Wo Contrast  Result Date: 08/28/2018 CLINICAL DATA:  Low back pain. Difficulty walking. No known injury. EXAM: MRI LUMBAR SPINE WITHOUT CONTRAST TECHNIQUE: Multiplanar, multisequence MR imaging of the lumbar spine was performed.  No intravenous contrast was administered. COMPARISON:  Plain films lumbar spine this same day. CT abdomen and pelvis 06/20/2018. FINDINGS: Segmentation:  Standard. Alignment:  Trace anterolisthesis L3 on L4 noted. Vertebrae: Scattered degenerative endplate signal change is seen. There is a Schmorl's node in the superior endplate of L1. No fracture or worrisome lesion. 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 Conus medullaris and cauda equina: Conus extends to the L1 level. Conus and cauda equina appear normal. Paraspinal and other soft tissues: T2 hyperintense lesions in the liver consistent with cyst as seen on the prior CT. Disc levels: T11-12: Shallow disc bulge and ligamentum flavum thickening are seen. The central canal is open. Mild to moderate foraminal narrowing is worse on the left. T12-L1: Minimal disc bulge without stenosis. L1-2: Shallow disc bulge causes mild central canal narrowing. Mild to moderate facet degenerative change is present. The foramina are open. L2-3: Shallow disc bulge and mild-to-moderate facet degenerative change. There is mild central canal narrowing. The foramina are open. L3-4: Broad-based disc bulge, ligamentum flavum thickening and moderate facet degenerative change are seen. There is severe central canal stenosis and marked narrowing in both subarticular recesses. Moderate to moderately severe foraminal narrowing is worse on the right. The central canal appears markedly narrowed on the prior CT. L4-5: Ligamentum flavum thickening, large broad-based disc bulge and moderate bilateral facet degenerative disease are identified. There is severe central canal and bilateral lateral recess narrowing. Moderately severe bilateral foraminal narrowing is present. The central canal appears severely narrowed on the prior CT. L5-S1: There is moderate to advanced facet degenerative change, worse on the left. Disc bulge with a superimposed down turning  right lateral recess protrusion are seen. Moderately severe central canal stenosis is present with marked narrowing of both lateral recesses, worse on the right where the patient's protrusion compresses the descending right S1 root. Moderately severe to severe bilateral foraminal narrowing appears worse on the right. IMPRESSION: No acute abnormality. Congenitally narrow central spinal canal due to short pedicle length. Severe central canal stenosis and marked narrowing in the subarticular recesses at L3-4. Moderate to moderately severe foraminal narrowing at this level appears worse on the right. Severe central canal and bilateral subarticular recess stenosis at L4-5. Moderately severe bilateral foraminal narrowing is also identified. Moderately severe central canal stenosis and marked narrowing of both subarticular recesses at L5-S1. Subarticular recess stenosis is worse on the right where there is a down turning disc protrusion. Moderately severe to severe bilateral foraminal narrowing at this level is worse on the right. Electronically Signed   By: Inge Rise M.D.   On: 08/28/2018 09:59    Echocardiogram 08/29/2018:  - Left ventricle: The cavity size was normal. There was mild   concentric hypertrophy. Systolic function was normal. The   estimated ejection fraction was in the range of 60% to 65%. Wall   motion was normal; there were no regional wall motion   abnormalities. Doppler parameters are consistent with abnormal  left ventricular relaxation (grade 1 diastolic dysfunction). - Mitral valve: There was mild regurgitation. - Tricuspid valve: There was mild regurgitation.  Subjective: Feels well. Weakness in leg improving. Eating normally, speaking normally. Denies sensory deficits or new weakness. Walked with PT, did well.   Discharge Exam: Vitals:   08/29/18 1236 08/29/18 1341  BP: 95/67 114/78  Pulse: 80 84  Resp: 16 16  Temp: 98.7 F (37.1 C) 98.6 F (37 C)  SpO2: 99% 100%    General: Pt is alert, awake, not in acute distress Cardiovascular: RRR, S1/S2 +, no rubs, no gallops Respiratory: CTA bilaterally, no wheezing, no rhonchi Abdominal: Soft, NT, ND, bowel sounds + Extremities: No edema, no cyanosis Neuro: Alert, oriented, mild cognitive impairment/impaired short term recall and delayed processing stable from previous exam. Left lower extremity weakness is modest, 4/5. Otherwise 5/5 throughout without sensory deficits x4 extremities. No CN deficits noted.  Labs: BNP (last 3 results) No results for input(s): BNP in the last 8760 hours. Basic Metabolic Panel: Recent Labs  Lab 08/28/18 0603 08/28/18 0618  NA 134* 137  K 3.5 3.5  CL 99 96*  CO2 29  --   GLUCOSE 100* 98  BUN 19 19  CREATININE 0.82 0.90  CALCIUM 8.7*  --    Liver Function Tests: Recent Labs  Lab 08/28/18 0603  AST 62*  ALT 65*  ALKPHOS 83  BILITOT 0.9  PROT 7.5  ALBUMIN 3.7   No results for input(s): LIPASE, AMYLASE in the last 168 hours. No results for input(s): AMMONIA in the last 168 hours. CBC: Recent Labs  Lab 08/28/18 0603 08/28/18 0618  WBC 6.1  --   NEUTROABS 3.6  --   HGB 12.9* 13.9  HCT 40.3 41.0  MCV 87.2  --   PLT 189  --    Cardiac Enzymes: No results for input(s): CKTOTAL, CKMB, CKMBINDEX, TROPONINI in the last 168 hours. BNP: Invalid input(s): POCBNP CBG: No results for input(s): GLUCAP in the last 168 hours. D-Dimer No results for input(s): DDIMER in the last 72 hours. Hgb A1c Recent Labs    08/28/18 0603  HGBA1C 5.7*   Lipid Profile Recent Labs    08/29/18 0442  CHOL 153  HDL 52  LDLCALC 90  TRIG 53  CHOLHDL 2.9   Thyroid function studies No results for input(s): TSH, T4TOTAL, T3FREE, THYROIDAB in the last 72 hours.  Invalid input(s): FREET3 Anemia work up No results for input(s): VITAMINB12, FOLATE, FERRITIN, TIBC, IRON, RETICCTPCT in the last 72 hours. Urinalysis    Component Value Date/Time   COLORURINE YELLOW 08/28/2018  0603   APPEARANCEUR CLEAR 08/28/2018 0603   APPEARANCEUR Clear 01/29/2017 1318   LABSPEC 1.017 08/28/2018 0603   PHURINE 5.0 08/28/2018 0603   GLUCOSEU NEGATIVE 08/28/2018 0603   HGBUR SMALL (A) 08/28/2018 0603   BILIRUBINUR NEGATIVE 08/28/2018 0603   BILIRUBINUR Negative 01/29/2017 1318   KETONESUR NEGATIVE 08/28/2018 0603   PROTEINUR NEGATIVE 08/28/2018 0603   UROBILINOGEN 0.2 08/23/2012 1615   NITRITE NEGATIVE 08/28/2018 0603   LEUKOCYTESUR NEGATIVE 08/28/2018 0603   LEUKOCYTESUR Negative 01/29/2017 1318    Microbiology No results found for this or any previous visit (from the past 240 hour(s)).  Time coordinating discharge: Approximately 40 minutes  Patrecia Pour, MD  [facility] 08/29/2018, 3:01 PM Pager [phone]

## 2018-08-29 NOTE — Progress Notes (Signed)
IV removed, 2x2 gauze and paper tape applied to site, patient tolerated well.  Reviewed AVS with patient who verbalized understanding, and all questions answered.  Patient to be transported home by his girlfriend.

## 2018-08-29 NOTE — Care Management Note (Signed)
Case Management Note  Patient Details  Name: Joshua Mckee MRN: [id number] Date of Birth: Nov 14, 1940 Subjective/Objective:     Acute CVA. Pt from home, lives with girlfriend. Independent with ADL's. Pt has insurance and PCP. Pt has cane and walker at home to use if needed. Pt agreeable to [facility] PT and has chosen [facility] from provider list. Aware HH has48 hrs to make first visit.                Action/Plan:  DC home with [facility]. Referral given to Tim, [facility] rep.   Expected Discharge Date:      08/29/18            Expected Discharge Plan:  [place]  In-House Referral:  NA  Discharge planning Services  CM Consult  Post Acute Care Choice:  Home Health Choice offered to:  Patient  HH Arranged:  PT [place]:  [facility] (formerly [facility])  Status of Service:  Completed, signed off  Sherald Barge, RN 08/29/2018, 12:49 PM

## 2018-08-29 NOTE — Evaluation (Signed)
Occupational Therapy Evaluation Patient Details Name: Joshua Mckee MRN: [id number] DOB: 27-Jul-1941 Today's Date: 08/29/2018    History of Present Illness Joshua Mckee is a 77 y.o. male with a history of HTN, HLD, GERD, BPH, right common iliac artery aneurysm, and seizure disorder who presented from home by EMS for inability to walk due to left leg weakness. He is a poor historian but reports a few days of weakness in the left leg, difficult to lift and affecting gait. He usually uses a cane but had to start using his girlfriend's walker for increasing, constant, moderate weakness in the left leg but this morning he was unable to even get up so asked girlfriend to call EMS.    Clinical Impression   Pt received supine in bed, agreeable to OT/PT co-evaluation. Pt demonstrates weakness in LUE, however is able to complete functional tasks using BUE without difficulty. Pt requiring supervision for ADL completion, impulsive at times during session. Pt with coordination WFL during ADL completion, some very slight coordination deficits at baseline due to arthritis. Pt appears to be close to baseline with ADLs, would benefit from OT evaluation in home environment to assess functioning and potentially improve safety in the home. No further acute OT needs at this time.     Follow Up Recommendations  Home health OT;Supervision/Assistance - 24 hour    Equipment Recommendations  None recommended by OT       Precautions / Restrictions Precautions Precautions: Fall Restrictions Weight Bearing Restrictions: No      Mobility Bed Mobility Overal bed mobility: Modified Independent                Transfers                 General transfer comment: See PT note        ADL either performed or assessed with clinical judgement   ADL Overall ADL's : Needs assistance/impaired Eating/Feeding: Modified independent;Sitting   Grooming: Supervision/safety;Standing               Lower  Body Dressing: Supervision/safety;Sit to/from stand   Toilet Transfer: Sales executive;Ambulation(SPC)   Toileting- Clothing Manipulation and Hygiene: Supervision/safety;Sitting/lateral lean;Sit to/from stand       Functional mobility during ADLs: Supervision/safety;Cane       Vision Baseline Vision/History: No visual deficits Patient Visual Report: No change from baseline Vision Assessment?: No apparent visual deficits            Pertinent Vitals/Pain Pain Assessment: No/denies pain     Hand Dominance Right   Extremity/Trunk Assessment Upper Extremity Assessment Upper Extremity Assessment: LUE deficits/detail LUE Deficits / Details: LUE strength at 4-/5 versus RUE at 5/5. Slightly decreased grip strength LUE Sensation: WNL LUE Coordination: WNL   Lower Extremity Assessment Lower Extremity Assessment: Defer to PT evaluation   Cervical / Trunk Assessment Cervical / Trunk Assessment: Kyphotic   Communication Communication Communication: No difficulties   Cognition Arousal/Alertness: Awake/alert Behavior During Therapy: WFL for tasks assessed/performed Overall Cognitive Status: Difficult to assess                                 General Comments: pt impulsive at times during evaluation              Home Living Family/patient expects to be discharged to:: Private residence Living Arrangements: Spouse/significant other(girlfriend) Available Help at Discharge: Family;Available PRN/intermittently Type of Home: Apartment Home Access: Level entry  Home Layout: One level     Bathroom Shower/Tub: Occupational psychologist: Standard     Home Equipment: Environmental consultant - 2 wheels;Cane - single point;Bedside commode;Shower seat          Prior Functioning/Environment Level of Independence: Independent with assistive device(s)        Comments: Independent in ADLs, uses cane or RW occasionally         OT Problem List:  Decreased strength;Impaired balance (sitting and/or standing);Decreased safety awareness;Decreased knowledge of use of DME or AE                       Co-evaluation PT/OT/SLP Co-Evaluation/Treatment: Yes Reason for Co-Treatment: Complexity of the patient's impairments (multi-system involvement);To address functional/ADL transfers   OT goals addressed during session: ADL's and self-care;Proper use of Adaptive equipment and DME         End of Session Equipment Utilized During Treatment: Gait belt(SPC)  Activity Tolerance: Patient tolerated treatment well Patient left: in chair;with call bell/phone within reach;with bed alarm set  OT Visit Diagnosis: Muscle weakness (generalized) (M62.81);Hemiplegia and hemiparesis Hemiplegia - Right/Left: Left Hemiplegia - dominant/non-dominant: Non-Dominant Hemiplegia - caused by: Cerebral infarction                Time: 8127-5170 OT Time Calculation (min): 25 min Charges:  OT General Charges $OT Visit: 1 Visit OT Evaluation $OT Eval Low Complexity: 1 Low   Guadelupe Sabin, OTR/L  [phone] 08/29/2018, 8:33 AM

## 2018-08-29 NOTE — Evaluation (Signed)
Speech Language Pathology Evaluation Patient Details Name: Joshua Mckee MRN: [id number] DOB: [birth year] Today's Date: 08/29/2018 Time: 5784-6962 SLP Time Calculation (min) (ACUTE ONLY): 21 min  Problem List:  Patient Active Problem List   Diagnosis Date Noted  . Acute CVA (cerebrovascular accident) ([place]) 08/28/2018  . Weakness 08/28/2018  . Overweight (BMI 25.0-29.9) 06/02/2018  . Alcoholism in remission ([place]) 06/13/2017  . Prostate cancer ([place]) 03/06/2017  . Hyperlipidemia 11/30/2016  . Seizure ([place]) 10/15/2016  . History of recent stroke 08/27/2016  . Hypertension 08/27/2016  . Esophageal ulcer 02/27/2016  . GI bleed 12/15/2015  . Reflux esophagitis   . Hiatal hernia    Past Medical History:  Past Medical History:  Diagnosis Date  . Aneurysm of right common iliac artery (HCC)    2.2 cm per CT 02-18-2017  . Arthritis   . GERD (gastroesophageal reflux disease)   . Hiatal hernia   . Hiccups    CHRONIC--- MULTIPLE ED VISITS  . History of alcohol abuse   . History of GI bleed    upper GI bleed from esophageal ulcer 01/ 2017 and 03/ 2017 from erosive esophageal gastiris  . 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 History of small bowel obstruction    04-06-2005  s/p  exp. lap. w/ lysis adhesions for partial sbo  . Hyperlipidemia   . Hyperlipidemia   . Hyperplasia of prostate with lower urinary tract symptoms (LUTS)   . Hypertension   . LAFB (left anterior fascicular block)   . Mild cognitive impairment with memory loss    PROBABLE STROKE RESIDUAL BUT POSSIBLE PRIOR TO STROKE  . Partial seizure ([place])    x1  10-15-2016 at ED visit , post cva 08-27-2016  . Poor historian   . Prostate cancer [facility]) urologist-  dr Jeffie Pollock  . Schatzki's ring   . Stroke ([place])   . Substance abuse [facility])    Past  Surgical History:  Past Surgical History:  Procedure Laterality Date  . COLONOSCOPY N/A 03/08/2016   Procedure: COLONOSCOPY;  Surgeon: Daneil Dolin, MD;  [facility];  Service: Endoscopy;  Laterality: N/A;  1300-moved to 1400  . ESOPHAGOGASTRODUODENOSCOPY N/A 10/05/2015   Dr. Gala Romney: erosive reflux esophagitis, non-critcal Schatki's ring non-manipulated  . ESOPHAGOGASTRODUODENOSCOPY N/A 12/15/2015   Dr. Rourk:non bleeding esophageal ulcer/small HH  . ESOPHAGOGASTRODUODENOSCOPY N/A 03/08/2016   Procedure: ESOPHAGOGASTRODUODENOSCOPY (EGD);  Surgeon: Daneil Dolin, MD;  [facility];  Service: Endoscopy;  Laterality: N/A;  . EXPLORATORY LAPAROTOMY W/ ADHESIOLYSIS  04/06/2005   partial SBO  . ORCHIECTOMY Bilateral 03/05/2017   Procedure: ORCHIECTOMY BILATERAL;  Surgeon: Irine Seal, MD;  Location: [facility];  Service: Urology;  Laterality: Bilateral;  . PROSTATE BIOPSY N/A 03/05/2017   Procedure: BIOPSY TRANSRECTAL ULTRASONIC PROSTATE (TUBP);  Surgeon: Irine Seal, MD;  Location: [facility];  Service: Urology;  Laterality: N/A;  . TRANSTHORACIC ECHOCARDIOGRAM  08/28/2016   mild to moderate LVH, ef 55-60%/  mild AR and TR/  trivial MR   HPI:      Assessment / Plan / Recommendation Clinical Impression  Suspect Pt's cognition is near baseline based on previous cognitive assessment completed in 2017. No dysarthria or aphasia were noted during speech language evaluation, however, Pt does present with mild cognitive impairment. Impairments include impulsivity, short term recall impairment  and slow processing of information of increasing complexity. Pt followed 1-2 step instructions without incident. He demonstrated tangential conversation throughout evaluation. Pt reports that he drives and plans to continue to do so; Pt was encouraged to f/u with his PCP prior to resuming all activities after d/c to ensure his safety and the safety of others. Pt  will benefit from [facility] for more in depth cognitive assessment and treatment as indicated. There are no further needs noted while in acute setting, however. ST to sign off. Thank you for this referral.    SLP Assessment  SLP Recommendation/Assessment: All further Speech Lanaguage Pathology  needs can be addressed in the next venue of care SLP Visit Diagnosis: Cognitive communication deficit (R41.841)    Follow Up Recommendations  Home health SLP          SLP Evaluation Cognition  Overall Cognitive Status: Difficult to assess Arousal/Alertness: Awake/alert Orientation Level: Oriented X4 Attention: Sustained Sustained Attention: Impaired Sustained Attention Impairment: Verbal basic Memory: Impaired Memory Impairment: Decreased short term memory;Decreased recall of new information Decreased Short Term Memory: Verbal basic Awareness: Impaired       Comprehension  Auditory Comprehension Overall Auditory Comprehension: Appears within functional limits for tasks assessed Visual Recognition/Discrimination Discrimination: Not tested Reading Comprehension Reading Status: Not tested    Expression Expression Primary Mode of Expression: Verbal Verbal Expression Overall Verbal Expression: Appears within functional limits for tasks assessed   Oral / Motor  Motor Speech Overall Motor Speech: Appears within functional limits for tasks assessed      Amelia H. Roddie Mc, CCC-SLP Speech Language Pathologist  Wende Bushy 08/29/2018, 8:22 AM

## 2018-09-02 ENCOUNTER — Telehealth: Payer: Self-pay

## 2018-09-02 NOTE — Telephone Encounter (Signed)
Alinda Money [facility] L  P [facility] Triage        30 day event monitor for stroke post hosp Deneise Lever penn per Dr.Grunz (hospitalist).   Thanks,  Coralyn Mark     I spoke with SGO, Lorna Dibble, on patient's cell phone. I explained to her that the [employer] would be calling her to verify their address and mail event monitor to them. I also told her the monitor comes with 24 hr customer service and easy to follow instructions with the monitor to apply.

## 2018-09-03 ENCOUNTER — Encounter: Payer: Self-pay | Admitting: Family Medicine

## 2018-09-03 ENCOUNTER — Ambulatory Visit (INDEPENDENT_AMBULATORY_CARE_PROVIDER_SITE_OTHER): Payer: Medicare Other | Admitting: Family Medicine

## 2018-09-03 VITALS — BP 109/74 | HR 85 | Temp 98.4°F | Ht 69.0 in | Wt 177.0 lb

## 2018-09-03 DIAGNOSIS — E663 Overweight: Secondary | ICD-10-CM | POA: Diagnosis not present

## 2018-09-03 DIAGNOSIS — I10 Essential (primary) hypertension: Secondary | ICD-10-CM

## 2018-09-03 DIAGNOSIS — E782 Mixed hyperlipidemia: Secondary | ICD-10-CM

## 2018-09-03 DIAGNOSIS — K2100 Gastro-esophageal reflux disease with esophagitis, without bleeding: Secondary | ICD-10-CM

## 2018-09-03 DIAGNOSIS — I6990 Unspecified sequelae of unspecified cerebrovascular disease: Secondary | ICD-10-CM

## 2018-09-03 NOTE — Progress Notes (Signed)
BP 109/74   Pulse 85   Temp 98.4 F (36.9 C) (Oral)   Ht '5\' 9"'  (1.753 m)   Wt 177 lb (80.3 kg)   BMI 26.14 kg/m    Subjective:    Patient ID: Joshua Mckee, male    DOB: [birth year], 77 y.o.   MRN: [id number]  HPI: Joshua Mckee is a 77 y.o. male presenting on 09/03/2018 for Hypertension (3 month follow up); Hyperlipidemia; and Hospitalization Follow-up (CVA)   HPI Hypertension Patient is currently on hydrochlorothiazide, and their blood pressure today is 109/74. Patient denies any lightheadedness or dizziness. Patient denies headaches, blurred vision, chest pains, shortness of breath, or weakness. Denies any side effects from medication and is content with current medication.   GERD Patient is currently on pantoprazole.  She denies any major symptoms or abdominal pain or belching or burping. She denies any blood in her stool or lightheadedness or dizziness.   Hyperlipidemia Patient is coming in for recheck of his hyperlipidemia. The patient is currently taking Lipitor. They deny any issues with myalgias or history of liver damage from it. They deny any focal numbness or weakness or chest pain.   CVA and left-sided weakness Patient is just getting out of the hospital and was admitted on 08/28/2018 and discharged the next day.  He was admitted for left-sided weakness and difficulty walking and was found to have a CVA.  He was increased on aspirin and started on a high-dose Lipitor for prevention.  He is strength has been coming back on that left side and he is walking with a cane but it is not quite 100%.  He says he does have home health physical therapy coming this coming week he believes to his house.  He was increased up to 325 mg aspirin and Lipitor 80 mg.  He denies ever having any vision difficulties or trouble swallowing or troubles with anything besides his left leg.  He denies any grip strength loss even in the left hand.  Relevant past medical, surgical, family and social  history reviewed and updated as indicated. Interim medical history since our last visit reviewed. Allergies and medications reviewed and updated.  Review of Systems  Constitutional: Negative for chills and fever.  Eyes: Negative for discharge and visual disturbance.  Respiratory: Negative for shortness of breath and wheezing.   Cardiovascular: Negative for chest pain and leg swelling.  Musculoskeletal: Negative for back pain and gait problem.  Skin: Negative for rash.  Neurological: Positive for weakness. Negative for dizziness, light-headedness and numbness.  All other systems reviewed and are negative.   Per HPI unless specifically indicated above   Allergies as of 09/03/2018   No Known Allergies     Medication List       Accurate as of September 03, 2018  3:56 PM. Always use your most recent med list.        aspirin 325 MG tablet Take 1 tablet (325 mg total) by mouth daily.   atorvastatin 80 MG tablet Commonly known as:  LIPITOR Take 1 tablet (80 mg total) by mouth daily. TAKE ONE TABLET BY MOUTH DAILY AT 6 PM.   hydrochlorothiazide 25 MG tablet Commonly known as:  HYDRODIURIL Take 1 tablet (25 mg total) by mouth daily. For blood pressure   levETIRAcetam 500 MG tablet Commonly known as:  KEPPRA TAKE ONE TABLET BY MOUTH 2 TIMES A DAY.   pantoprazole 40 MG tablet Commonly known as:  PROTONIX TAKE (1) TABLET BY MOUTH ONCE DAILY.  tamsulosin 0.4 MG Caps capsule Commonly known as:  FLOMAX Take 1 capsule (0.4 mg total) by mouth daily.          Objective:    BP 109/74   Pulse 85   Temp 98.4 F (36.9 C) (Oral)   Ht '5\' 9"'  (1.753 m)   Wt 177 lb (80.3 kg)   BMI 26.14 kg/m   Wt Readings from Last 3 Encounters:  09/03/18 177 lb (80.3 kg)  08/28/18 170 lb 4.8 oz (77.2 kg)  07/21/18 169 lb (76.7 kg)    Physical Exam Vitals signs and nursing note reviewed.  Constitutional:      General: He is not in acute distress.    Appearance: He is well-developed. He  is not diaphoretic.  Eyes:     General: No scleral icterus.       Right eye: No discharge.        Left eye: No discharge.     Extraocular Movements: Extraocular movements intact.     Conjunctiva/sclera: Conjunctivae normal.     Pupils: Pupils are equal, round, and reactive to light.  Neck:     Musculoskeletal: Neck supple.     Thyroid: No thyromegaly.  Cardiovascular:     Rate and Rhythm: Normal rate and regular rhythm.     Heart sounds: Normal heart sounds. No murmur.  Pulmonary:     Effort: Pulmonary effort is normal. No respiratory distress.     Breath sounds: Normal breath sounds. No wheezing.  Musculoskeletal: Normal range of motion.  Lymphadenopathy:     Cervical: No cervical adenopathy.  Skin:    General: Skin is warm and dry.     Findings: No rash.  Neurological:     Mental Status: He is alert and oriented to person, place, and time.     Cranial Nerves: No cranial nerve deficit.     Sensory: No sensory deficit.     Motor: No weakness (No visible weakness on exam, 5 out of 5 strength in all 4 extremities).     Coordination: Coordination normal.     Gait: Gait (Does not appear to have been a abnormal gait but is using a cane) normal.     Deep Tendon Reflexes: Reflexes normal.  Psychiatric:        Behavior: Behavior normal.         Assessment & Plan:   Problem List Items Addressed This Visit      Cardiovascular and Mediastinum   Hypertension   Relevant Orders   CBC with Differential/Platelet   CMP14+EGFR     Digestive   Reflux esophagitis     Other   Hyperlipidemia   Relevant Orders   Lipid panel   Overweight (BMI 25.0-29.9)   History of CVA (cerebrovascular accident) - Primary       Follow up plan: Return in about 3 months (around 12/03/2018), or if symptoms worsen or fail to improve, for Recheck hypertension cholesterol.  Counseling provided for all of the vaccine components Orders Placed This Encounter  Procedures  . CBC with  Differential/Platelet  . CMP14+EGFR  . Lipid panel    Caryl Pina, MD [facility] 09/03/2018, 3:56 PM

## 2018-09-04 LAB — CBC WITH DIFFERENTIAL/PLATELET
Basophils Absolute: 0 10*3/uL (ref 0.0–0.2)
Basos: 0 %
EOS (ABSOLUTE): 0.3 10*3/uL (ref 0.0–0.4)
Eos: 4 %
Hematocrit: 36 % — ABNORMAL LOW (ref 37.5–51.0)
Hemoglobin: 12.5 g/dL — ABNORMAL LOW (ref 13.0–17.7)
Immature Grans (Abs): 0 10*3/uL (ref 0.0–0.1)
Immature Granulocytes: 0 %
Lymphocytes Absolute: 1.9 10*3/uL (ref 0.7–3.1)
Lymphs: 27 %
MCH: 28.9 pg (ref 26.6–33.0)
MCHC: 34.7 g/dL (ref 31.5–35.7)
MCV: 83 fL (ref 79–97)
Monocytes Absolute: 0.7 10*3/uL (ref 0.1–0.9)
Monocytes: 9 %
NEUTROS ABS: 4.1 10*3/uL (ref 1.4–7.0)
Neutrophils: 60 %
Platelets: 214 10*3/uL (ref 150–450)
RBC: 4.33 x10E6/uL (ref 4.14–5.80)
RDW: 13.6 % (ref 12.3–15.4)
WBC: 7 10*3/uL (ref 3.4–10.8)

## 2018-09-04 LAB — CMP14+EGFR
ALT: 64 IU/L — ABNORMAL HIGH (ref 0–44)
AST: 67 IU/L — AB (ref 0–40)
Albumin/Globulin Ratio: 1.3 (ref 1.2–2.2)
Albumin: 3.9 g/dL (ref 3.5–4.8)
Alkaline Phosphatase: 105 IU/L (ref 39–117)
BUN/Creatinine Ratio: 24 (ref 10–24)
BUN: 18 mg/dL (ref 8–27)
Bilirubin Total: 0.3 mg/dL (ref 0.0–1.2)
CO2: 29 mmol/L (ref 20–29)
Calcium: 8.7 mg/dL (ref 8.6–10.2)
Chloride: 99 mmol/L (ref 96–106)
Creatinine, Ser: 0.75 mg/dL — ABNORMAL LOW (ref 0.76–1.27)
GFR calc Af Amer: 101 mL/min/{1.73_m2} (ref >59)
GFR calc non Af Amer: 87 mL/min/{1.73_m2} (ref >59)
Globulin, Total: 3 g/dL (ref 1.5–4.5)
Glucose: 93 mg/dL (ref 65–99)
Potassium: 4 mmol/L (ref 3.5–5.2)
Sodium: 139 mmol/L (ref 134–144)
Total Protein: 6.9 g/dL (ref 6.0–8.5)

## 2018-09-04 LAB — LIPID PANEL
Chol/HDL Ratio: 2.5 ratio (ref 0.0–5.0)
Cholesterol, Total: 158 mg/dL (ref 100–199)
HDL: 63 mg/dL (ref >39)
LDL Calculated: 84 mg/dL (ref 0–99)
TRIGLYCERIDES: 54 mg/dL (ref 0–149)
VLDL Cholesterol Cal: 11 mg/dL (ref 5–40)

## 2018-09-05 ENCOUNTER — Other Ambulatory Visit: Payer: Self-pay | Admitting: *Deleted

## 2018-09-05 DIAGNOSIS — R748 Abnormal levels of other serum enzymes: Secondary | ICD-10-CM

## 2018-09-08 ENCOUNTER — Ambulatory Visit: Payer: Medicare Other | Admitting: *Deleted

## 2018-09-08 NOTE — Progress Notes (Signed)
Erroneous encounter

## 2018-09-08 NOTE — Progress Notes (Signed)
Patient presents to ED desk asking for someone to put 30 day event monitor on him. States physician's office does not do this. Tyleen Moody contacted [facility] to assist. Josie Saunders FM contacted and spoke with Zigmund Daniel, office nurse and Sharee Pimple office nursing manager who advise patient to come to office on Friday, December 27 between 8am-12pm to have monitor activated. Advise to have patient send back current monitor and they will order a [employer] monitor so they can follow. Spoke with patient and advised of such.

## 2018-09-11 ENCOUNTER — Ambulatory Visit (HOSPITAL_COMMUNITY): Payer: Medicare Other

## 2018-09-12 ENCOUNTER — Observation Stay (HOSPITAL_COMMUNITY): Payer: Medicare Other

## 2018-09-12 ENCOUNTER — Encounter (HOSPITAL_COMMUNITY): Payer: Self-pay | Admitting: Emergency Medicine

## 2018-09-12 ENCOUNTER — Emergency Department (HOSPITAL_COMMUNITY): Payer: Medicare Other

## 2018-09-12 ENCOUNTER — Inpatient Hospital Stay (HOSPITAL_COMMUNITY)
Admission: EM | Admit: 2018-09-12 | Discharge: 2018-09-21 | DRG: 330 | Disposition: A | Discharge status: Home Health | Payer: Medicare Other | Attending: General Surgery | Admitting: General Surgery

## 2018-09-12 ENCOUNTER — Other Ambulatory Visit: Payer: Self-pay

## 2018-09-12 ENCOUNTER — Inpatient Hospital Stay (HOSPITAL_COMMUNITY): Payer: Medicare Other

## 2018-09-12 DIAGNOSIS — K210 Gastro-esophageal reflux disease with esophagitis: Secondary | ICD-10-CM | POA: Diagnosis present

## 2018-09-12 DIAGNOSIS — R740 Nonspecific elevation of levels of transaminase and lactic acid dehydrogenase [LDH]: Secondary | ICD-10-CM | POA: Diagnosis not present

## 2018-09-12 DIAGNOSIS — R066 Hiccough: Secondary | ICD-10-CM | POA: Diagnosis not present

## 2018-09-12 DIAGNOSIS — K769 Liver disease, unspecified: Secondary | ICD-10-CM | POA: Diagnosis present

## 2018-09-12 DIAGNOSIS — M48061 Spinal stenosis, lumbar region without neurogenic claudication: Secondary | ICD-10-CM | POA: Diagnosis not present

## 2018-09-12 DIAGNOSIS — I69319 Unspecified symptoms and signs involving cognitive functions following cerebral infarction: Secondary | ICD-10-CM | POA: Diagnosis not present

## 2018-09-12 DIAGNOSIS — K449 Diaphragmatic hernia without obstruction or gangrene: Secondary | ICD-10-CM | POA: Diagnosis present

## 2018-09-12 DIAGNOSIS — Z0189 Encounter for other specified special examinations: Secondary | ICD-10-CM

## 2018-09-12 DIAGNOSIS — I10 Essential (primary) hypertension: Secondary | ICD-10-CM | POA: Diagnosis present

## 2018-09-12 DIAGNOSIS — I444 Left anterior fascicular block: Secondary | ICD-10-CM | POA: Diagnosis present

## 2018-09-12 DIAGNOSIS — K565 Intestinal adhesions [bands], unspecified as to partial versus complete obstruction: Secondary | ICD-10-CM | POA: Diagnosis present

## 2018-09-12 DIAGNOSIS — Z79899 Other long term (current) drug therapy: Secondary | ICD-10-CM | POA: Diagnosis not present

## 2018-09-12 DIAGNOSIS — Z7982 Long term (current) use of aspirin: Secondary | ICD-10-CM | POA: Diagnosis not present

## 2018-09-12 DIAGNOSIS — E785 Hyperlipidemia, unspecified: Secondary | ICD-10-CM | POA: Diagnosis present

## 2018-09-12 DIAGNOSIS — E876 Hypokalemia: Secondary | ICD-10-CM | POA: Diagnosis not present

## 2018-09-12 DIAGNOSIS — N179 Acute kidney failure, unspecified: Secondary | ICD-10-CM | POA: Diagnosis not present

## 2018-09-12 DIAGNOSIS — K5651 Intestinal adhesions [bands], with partial obstruction: Secondary | ICD-10-CM | POA: Diagnosis present

## 2018-09-12 DIAGNOSIS — K567 Ileus, unspecified: Secondary | ICD-10-CM | POA: Diagnosis not present

## 2018-09-12 DIAGNOSIS — C61 Malignant neoplasm of prostate: Secondary | ICD-10-CM | POA: Diagnosis present

## 2018-09-12 DIAGNOSIS — M25552 Pain in left hip: Secondary | ICD-10-CM | POA: Diagnosis present

## 2018-09-12 DIAGNOSIS — M1990 Unspecified osteoarthritis, unspecified site: Secondary | ICD-10-CM | POA: Diagnosis present

## 2018-09-12 DIAGNOSIS — Z8546 Personal history of malignant neoplasm of prostate: Secondary | ICD-10-CM | POA: Diagnosis not present

## 2018-09-12 DIAGNOSIS — N400 Enlarged prostate without lower urinary tract symptoms: Secondary | ICD-10-CM | POA: Diagnosis present

## 2018-09-12 DIAGNOSIS — R569 Unspecified convulsions: Secondary | ICD-10-CM

## 2018-09-12 DIAGNOSIS — K219 Gastro-esophageal reflux disease without esophagitis: Secondary | ICD-10-CM | POA: Diagnosis present

## 2018-09-12 DIAGNOSIS — G40909 Epilepsy, unspecified, not intractable, without status epilepticus: Secondary | ICD-10-CM | POA: Diagnosis present

## 2018-09-12 DIAGNOSIS — K56609 Unspecified intestinal obstruction, unspecified as to partial versus complete obstruction: Secondary | ICD-10-CM | POA: Diagnosis present

## 2018-09-12 DIAGNOSIS — Z4659 Encounter for fitting and adjustment of other gastrointestinal appliance and device: Secondary | ICD-10-CM

## 2018-09-12 DIAGNOSIS — Z8673 Personal history of transient ischemic attack (TIA), and cerebral infarction without residual deficits: Secondary | ICD-10-CM

## 2018-09-12 DIAGNOSIS — I69344 Monoplegia of lower limb following cerebral infarction affecting left non-dominant side: Secondary | ICD-10-CM | POA: Diagnosis not present

## 2018-09-12 DIAGNOSIS — R7401 Elevation of levels of liver transaminase levels: Secondary | ICD-10-CM | POA: Diagnosis present

## 2018-09-12 DIAGNOSIS — M47816 Spondylosis without myelopathy or radiculopathy, lumbar region: Secondary | ICD-10-CM | POA: Diagnosis not present

## 2018-09-12 HISTORY — DX: Acute kidney failure, unspecified: N17.9

## 2018-09-12 LAB — COMPREHENSIVE METABOLIC PANEL
ALT: 77 U/L — AB (ref 0–44)
AST: 84 U/L — ABNORMAL HIGH (ref 15–41)
Albumin: 4 g/dL (ref 3.5–5.0)
Alkaline Phosphatase: 80 U/L (ref 38–126)
Anion gap: 10 (ref 5–15)
BILIRUBIN TOTAL: 0.8 mg/dL (ref 0.3–1.2)
BUN: 20 mg/dL (ref 8–23)
CO2: 26 mmol/L (ref 22–32)
Calcium: 9.1 mg/dL (ref 8.9–10.3)
Chloride: 101 mmol/L (ref 98–111)
Creatinine, Ser: 0.85 mg/dL (ref 0.61–1.24)
GFR calc Af Amer: >60 mL/min (ref >60)
GFR calc non Af Amer: >60 mL/min (ref >60)
Glucose, Bld: 145 mg/dL — ABNORMAL HIGH (ref 70–99)
Potassium: 3.8 mmol/L (ref 3.5–5.1)
Sodium: 137 mmol/L (ref 135–145)
TOTAL PROTEIN: 7.8 g/dL (ref 6.5–8.1)

## 2018-09-12 LAB — CBC WITH DIFFERENTIAL/PLATELET
Abs Immature Granulocytes: 0.02 10*3/uL (ref 0.00–0.07)
Basophils Absolute: 0 10*3/uL (ref 0.0–0.1)
Basophils Relative: 0 %
EOS ABS: 0 10*3/uL (ref 0.0–0.5)
Eosinophils Relative: 0 %
HCT: 42.7 % (ref 39.0–52.0)
Hemoglobin: 13.6 g/dL (ref 13.0–17.0)
Immature Granulocytes: 0 %
Lymphocytes Relative: 9 %
Lymphs Abs: 0.7 10*3/uL (ref 0.7–4.0)
MCH: 27.9 pg (ref 26.0–34.0)
MCHC: 31.9 g/dL (ref 30.0–36.0)
MCV: 87.7 fL (ref 80.0–100.0)
MONOS PCT: 7 %
Monocytes Absolute: 0.5 10*3/uL (ref 0.1–1.0)
Neutro Abs: 6.4 10*3/uL (ref 1.7–7.7)
Neutrophils Relative %: 84 %
Platelets: 202 10*3/uL (ref 150–400)
RBC: 4.87 MIL/uL (ref 4.22–5.81)
RDW: 14.6 % (ref 11.5–15.5)
WBC: 7.6 10*3/uL (ref 4.0–10.5)
nRBC: 0 % (ref 0.0–0.2)

## 2018-09-12 LAB — URINALYSIS, ROUTINE W REFLEX MICROSCOPIC
Bilirubin Urine: NEGATIVE
GLUCOSE, UA: NEGATIVE mg/dL
Hgb urine dipstick: NEGATIVE
Ketones, ur: NEGATIVE mg/dL
Leukocytes, UA: NEGATIVE
Nitrite: NEGATIVE
PH: 5 (ref 5.0–8.0)
Protein, ur: NEGATIVE mg/dL
Specific Gravity, Urine: >1.046 — ABNORMAL HIGH (ref 1.005–1.030)

## 2018-09-12 LAB — LIPASE, BLOOD: Lipase: 29 U/L (ref 11–51)

## 2018-09-12 LAB — CBG MONITORING, ED: Glucose-Capillary: 125 mg/dL — ABNORMAL HIGH (ref 70–99)

## 2018-09-12 MED ORDER — BISACODYL 10 MG RE SUPP
10.0000 mg | Freq: Every day | RECTAL | Status: DC
  Administered 2018-09-13 – 2018-09-15 (×3): 10 mg via RECTAL
  Filled 2018-09-12 (×3): qty 1

## 2018-09-12 MED ORDER — ONDANSETRON HCL 4 MG PO TABS: 4.0000 mg | ORAL_TABLET | Freq: Four times a day (QID) | ORAL | Status: DC | PRN

## 2018-09-12 MED ORDER — LEVETIRACETAM IN NACL 500 MG/100ML IV SOLN
500.0000 mg | Freq: Two times a day (BID) | INTRAVENOUS | Status: DC
  Administered 2018-09-12 – 2018-09-15 (×8): 500 mg via INTRAVENOUS
  Filled 2018-09-12 (×14): qty 100

## 2018-09-12 MED ORDER — ACETAMINOPHEN 650 MG RE SUPP: 650.0000 mg | Freq: Four times a day (QID) | RECTAL | Status: DC | PRN

## 2018-09-12 MED ORDER — ONDANSETRON HCL 4 MG/2ML IJ SOLN
4.0000 mg | Freq: Once | INTRAMUSCULAR | Status: AC
  Administered 2018-09-12: 4 mg via INTRAVENOUS
  Filled 2018-09-12: qty 2

## 2018-09-12 MED ORDER — SODIUM CHLORIDE 0.45 % IV SOLN
INTRAVENOUS | Status: AC
  Administered 2018-09-12: 08:00:00 via INTRAVENOUS

## 2018-09-12 MED ORDER — MORPHINE SULFATE (PF) 2 MG/ML IV SOLN: 1.0000 mg | INTRAVENOUS | Status: DC | PRN

## 2018-09-12 MED ORDER — ACETAMINOPHEN 325 MG PO TABS: 650.0000 mg | ORAL_TABLET | Freq: Four times a day (QID) | ORAL | Status: DC | PRN

## 2018-09-12 MED ORDER — ONDANSETRON HCL 4 MG/2ML IJ SOLN
4.0000 mg | Freq: Four times a day (QID) | INTRAMUSCULAR | Status: DC | PRN
  Administered 2018-09-13 – 2018-09-15 (×2): 4 mg via INTRAVENOUS
  Filled 2018-09-12 (×2): qty 2

## 2018-09-12 MED ORDER — IOPAMIDOL (ISOVUE-300) INJECTION 61%
100.0000 mL | Freq: Once | INTRAVENOUS | Status: AC | PRN
  Administered 2018-09-12: 100 mL via INTRAVENOUS

## 2018-09-12 NOTE — ED Provider Notes (Signed)
[facility] EMERGENCY DEPARTMENT Provider Note   CSN: [id number] Arrival date & time: 09/12/18  0133     History   Chief Complaint Chief Complaint  Patient presents with  . Emesis    HPI Joshua Mckee is a 77 y.o. male.  HPI Patient presents with nausea vomiting and abdominal pain.  Pain in his lower abdomen.  Began yesterday.  States he has had no trouble having a bowel movement or passing gas.  Has had previous abdominal surgeries.  States they had to go in there for bleeding.  Scar on the lower abdomen on the midline.  Recent admission to the hospital for stroke.  He is somewhat difficult to get history from.  Reviewing records appears that he has had previous small bowel obstruction. Past Medical History:  Diagnosis Date  . Aneurysm of right common iliac artery (HCC)    2.2 cm per CT 02-18-2017  . Arthritis   . GERD (gastroesophageal reflux disease)   . Hiatal hernia   . Hiccups    CHRONIC--- MULTIPLE ED VISITS  . History of alcohol abuse   . History of GI bleed    upper GI bleed from esophageal ulcer 01/ 2017 and 03/ 2017 from erosive esophageal gastiris  . 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 History of small bowel obstruction    04-06-2005  s/p  exp. lap. w/ lysis adhesions for partial sbo  . Hyperlipidemia   . Hyperlipidemia   . Hyperplasia of prostate with lower urinary tract symptoms (LUTS)   . Hypertension   . LAFB (left anterior fascicular block)   . Mild cognitive impairment with memory loss    PROBABLE STROKE RESIDUAL BUT POSSIBLE PRIOR TO STROKE  . Partial seizure ([place])    x1  10-15-2016 at ED visit , post cva 08-27-2016  . Poor historian   . Prostate cancer [facility]) urologist-  dr Jeffie Pollock  . Schatzki's ring   . Stroke ([place])   . Substance abuse [facility])     Patient Active  Problem List   Diagnosis Date Noted  . History of CVA (cerebrovascular accident) 08/28/2018  . Weakness 08/28/2018  . Overweight (BMI 25.0-29.9) 06/02/2018  . Alcoholism in remission ([place]) 06/13/2017  . Prostate cancer ([place]) 03/06/2017  . Hyperlipidemia 11/30/2016  . Seizure ([place]) 10/15/2016  . History of recent stroke 08/27/2016  . Hypertension 08/27/2016  . Esophageal ulcer 02/27/2016  . GI bleed 12/15/2015  . Reflux esophagitis   . Hiatal hernia     Past Surgical History:  Procedure Laterality Date  . COLONOSCOPY N/A 03/08/2016   Procedure: COLONOSCOPY;  Surgeon: Daneil Dolin, MD;  Location[facility];  Service: Endoscopy;  Laterality: N/A;  1300-moved to 1400  . ESOPHAGOGASTRODUODENOSCOPY N/A 10/05/2015   Dr. Gala Romney: erosive reflux esophagitis, non-critcal Schatki's ring non-manipulated  . ESOPHAGOGASTRODUODENOSCOPY N/A 12/15/2015   Dr. Rourk:non bleeding esophageal ulcer/small HH  . ESOPHAGOGASTRODUODENOSCOPY N/A 03/08/2016   Procedure: ESOPHAGOGASTRODUODENOSCOPY (EGD);  Surgeon: Daneil Dolin, MD;  Location: [facility];  Service: Endoscopy;  Laterality: N/A;  . EXPLORATORY LAPAROTOMY W/ ADHESIOLYSIS  04/06/2005   partial SBO  . ORCHIECTOMY Bilateral 03/05/2017   Procedure: ORCHIECTOMY BILATERAL;  Surgeon: Irine Seal, MD;  Location: [facility];  Service: Urology;  Laterality: Bilateral;  . PROSTATE BIOPSY N/A 03/05/2017   Procedure: BIOPSY TRANSRECTAL  ULTRASONIC PROSTATE (TUBP);  Surgeon: Irine Seal, MD;  Location: [facility];  Service: Urology;  Laterality: N/A;  . TRANSTHORACIC ECHOCARDIOGRAM  08/28/2016   mild to moderate LVH, ef 55-60%/  mild AR and TR/  trivial MR        Home Medications    Prior to Admission medications   Medication Sig Start Date End Date Taking? Authorizing Provider  aspirin 325 MG tablet Take 1 tablet (325 mg total) by mouth daily. 08/30/18   Patrecia Pour, MD  atorvastatin (LIPITOR) 80 MG tablet Take 1  tablet (80 mg total) by mouth daily. TAKE ONE TABLET BY MOUTH DAILY AT 6 PM. 08/29/18   Patrecia Pour, MD  hydrochlorothiazide (HYDRODIURIL) 25 MG tablet Take 1 tablet (25 mg total) by mouth daily. For blood pressure 06/02/18   Dettinger, Fransisca Kaufmann, MD  levETIRAcetam (KEPPRA) 500 MG tablet TAKE ONE TABLET BY MOUTH 2 TIMES A DAY. 06/02/18   Dettinger, Fransisca Kaufmann, MD  pantoprazole (PROTONIX) 40 MG tablet TAKE (1) TABLET BY MOUTH ONCE DAILY. 06/02/18   Dettinger, Fransisca Kaufmann, MD  tamsulosin (FLOMAX) 0.4 MG CAPS capsule Take 1 capsule (0.4 mg total) by mouth daily. 06/02/18   Dettinger, Fransisca Kaufmann, MD    Family History Family History  Problem Relation Age of Onset  . Colon cancer Neg Hx     Social History Social History   Tobacco Use  . Smoking status: Never Smoker  . Smokeless tobacco: Never Used  Substance Use Topics  . Alcohol use: No    Comment: hx alcohol abuse -- last drink 12/ 2017   . Drug use: No     Allergies   Patient has no known allergies.   Review of Systems Review of Systems  Constitutional: Positive for appetite change.  HENT: Negative for congestion.   Respiratory: Negative for shortness of breath.   Cardiovascular: Negative for chest pain.  Gastrointestinal: Positive for abdominal distention, abdominal pain, nausea and vomiting. Negative for constipation and diarrhea.  Genitourinary: Negative for flank pain.  Musculoskeletal: Negative for back pain.  Skin: Negative for rash.  Neurological: Positive for weakness.  Psychiatric/Behavioral: Negative for confusion.     Physical Exam Updated Vital Signs BP (!) 136/95 (BP Location: Right Arm)   Pulse 77   Temp 98.4 F (36.9 C) (Oral)   Resp 17   Ht 5\' 9"  (1.753 m)   Wt 80 kg   SpO2 97%   BMI 26.05 kg/m   Physical Exam HENT:     Head: Normocephalic.     Mouth/Throat:     Mouth: Mucous membranes are moist.  Eyes:     General:        Right eye: No discharge.        Left eye: No discharge.  Cardiovascular:      Rate and Rhythm: Regular rhythm.  Pulmonary:     Breath sounds: No wheezing, rhonchi or rales.  Abdominal:     General: There is distension.     Comments: Lower abdominal distention and tenderness.  Midline scar.  No hernia palpated.  Musculoskeletal:        General: No tenderness.  Skin:    General: Skin is warm.     Capillary Refill: Capillary refill takes less than 2 seconds.     Coloration: Skin is not jaundiced.  Neurological:     Mental Status: He is alert. Mental status is at baseline.      ED Treatments / Results  Labs (  all labs ordered are listed, but only abnormal results are displayed) Labs Reviewed  COMPREHENSIVE METABOLIC PANEL - Abnormal; Notable for the following components:      Result Value   Glucose, Bld 145 (*)    AST 84 (*)    ALT 77 (*)    All other components within normal limits  LIPASE, BLOOD  CBC WITH DIFFERENTIAL/PLATELET  URINALYSIS, ROUTINE W REFLEX MICROSCOPIC    EKG None  Radiology Ct Abdomen Pelvis W Contrast  Result Date: 09/12/2018 CLINICAL DATA:  Acute onset of nausea, vomiting and diarrhea. Abdominal distention. EXAM: CT ABDOMEN AND PELVIS WITH CONTRAST TECHNIQUE: Multidetector CT imaging of the abdomen and pelvis was performed using the standard protocol following bolus administration of intravenous contrast. CONTRAST:  142mL ISOVUE-300 IOPAMIDOL (ISOVUE-300) INJECTION 61% COMPARISON:  CT of the abdomen and pelvis performed 06/20/2018, and MRI of the lumbar spine performed 08/28/2018 FINDINGS: Lower chest: Minimal scarring is noted at the left lung base. A small to moderate hiatal hernia is noted. Hepatobiliary: Scattered hypodensities within the liver measure up to 4.0 cm in size. The gallbladder is grossly unremarkable. The common bile duct is normal in caliber. Pancreas: The pancreas is within normal limits. Spleen: The spleen is unremarkable in appearance. Adrenals/Urinary Tract: The adrenal glands are unremarkable in appearance. Small  bilateral renal cysts are noted. Left renal scarring is seen. There is no evidence of hydronephrosis. No renal or ureteral stones are identified. Minimal right-sided perinephric stranding is noted. Stomach/Bowel: The stomach is unremarkable in appearance. There is diffuse dilatation of small-bowel loops to 4.1 cm in maximal diameter, with a focal transition point at the right lower quadrant at the mid to distal ileum. This is compatible with high-grade small bowel obstruction, likely secondary to an adhesion. Trace associated free fluid is noted at the right lower quadrant. The appendix is not visualized; there is no evidence for appendicitis. The colon is unremarkable in appearance. Vascular/Lymphatic: The abdominal aorta is unremarkable in appearance. There is mild aneurysmal dilatation of the right common iliac artery to 2.3 cm in diameter, with associated mural thrombus. The inferior vena cava is grossly unremarkable. No retroperitoneal lymphadenopathy is seen. No pelvic sidewall lymphadenopathy is identified. Reproductive: The bladder is decompressed and not well characterized. The prostate remains normal in size. Other: No additional soft tissue abnormalities are seen. Musculoskeletal: No acute osseous abnormalities are identified. The visualized musculature is unremarkable in appearance. IMPRESSION: 1. Diffuse dilatation of small-bowel loops to 4.1 cm in maximal diameter, with a focal transition point at the right lower quadrant at the mid to distal ileum. This is compatible with high-grade small bowel obstruction, likely secondary to an adhesion. Trace associated free fluid at the right lower quadrant. 2. Small to moderate hiatal hernia noted. 3. Scattered hypodensities within the liver measure up to 4.0 cm in size, similar to the prior study. 4. Small bilateral renal cysts. Left renal scarring seen. 5. Mild aneurysmal dilatation of the right common iliac artery to 2.3 cm in diameter, with associated mural  thrombus. These results were called by telephone at the time of interpretation on 09/12/2018 at 4:30 am to Dr. Davonna Belling , who verbally acknowledged these results. Electronically Signed   By: Garald Balding M.D.   On: 09/12/2018 04:30   Dg Abdomen Acute W/chest  Result Date: 09/12/2018 CLINICAL DATA:  Nausea and vomiting EXAM: DG ABDOMEN ACUTE W/ 1V CHEST COMPARISON:  08/28/2018 FINDINGS: Cardiac shadow is stable. Loop recorder is noted. Stable scarring is noted in the left  base. No focal infiltrate is seen. Multiple dilated loops of small bowel are noted with differential air-fluid levels consistent small-bowel obstruction. Some fecal impaction in the rectum is noted. No free air is seen. IMPRESSION: Changes consistent with small bowel obstruction. CT is recommended for further evaluation. Electronically Signed   By: Inez Catalina M.D.   On: 09/12/2018 02:44    Procedures Procedures (including critical care time)  Medications Ordered in ED Medications  ondansetron (ZOFRAN) injection 4 mg (4 mg Intravenous Given 09/12/18 0340)  iopamidol (ISOVUE-300) 61 % injection 100 mL (100 mLs Intravenous Contrast Given 09/12/18 0355)     Initial Impression / Assessment and Plan / ED Course  I have reviewed the triage vital signs and the nursing notes.  Pertinent labs & imaging results that were available during my care of the patient were reviewed by me and considered in my medical decision making (see chart for details).     Patient with nausea vomiting abdominal pain.  Acute abdominal series showed possible obstruction.  CT scan does show relatively high-grade bowel obstruction.  Still has had vomiting but still having bowel movements and passing gas.  Previous adhesions and likely the cause of this.  Discussed with Dr. Myna Hidalgo from hospitalist.  Will start NG tube and have surgery consulted in the morning.  Final Clinical Impressions(s) / ED Diagnoses   Final diagnoses:  Small bowel  obstruction due to adhesions [facility])    ED Discharge Orders    None       Davonna Belling, MD 09/12/18 [phone]

## 2018-09-12 NOTE — Care Management Obs Status (Signed)
[place] NOTIFICATION   Patient Details  Name: Joshua Mckee MRN: [id number] Date of Birth: 1941/06/20   Medicare Observation Status Notification Given:  Yes    Sherald Barge, RN 09/12/2018, 8:54 AM

## 2018-09-12 NOTE — ED Triage Notes (Signed)
Pt arrives by [facility] for N/V/D x1 day. Pt has some abdominal distention.

## 2018-09-12 NOTE — Progress Notes (Signed)
Patient admitted to the hospital earlier this morning by Dr. Myna Hidalgo  Patient seen and examined.  Reports having a bowel movement earlier this morning.  He has passed some gas.  No further nausea or vomiting.  NG tube is in place.  Abdomen feels soft.  Bowel sounds are present.  He was admitted to the hospital with abdominal pain, nausea and vomiting.  Found to have high-grade bowel obstruction.  Clinically appears to be improving.  NG tube is been placed.  Since he is having bowel movements, will clamp NG tube and start on clear liquids.  If he tolerates this, can likely have solid foods.  Anticipate discharge home in the next 24 hours.  Continue IV hydration.  [employer]

## 2018-09-12 NOTE — Progress Notes (Signed)
NG tube that was placed in the ED in the right nares was pulled out by patient. Patient began vomiting once on the floor. Attending RN placed a 16 fr NG tube in the left nares and ordered a STAT x-ray to verify placement .NG tube currently clamped.  MD made aware. Will pass information on to night shift RN.

## 2018-09-12 NOTE — H&P (Signed)
History and Physical    Joshua Mckee PXT:[id number] DOB: 09-May-1941 DOA: 09/12/2018  PCP: Dettinger, Fransisca Kaufmann, MD   Patient coming from: Home   Chief Complaint: N/V/D, distended abd   HPI: Joshua Mckee is a 77 y.o. male with medical history significant for seizure disorder, prostate cancer status post surgery with bilateral orchiectomy, history of SBO status post lysis of adhesions, and recent ischemic CVA, now presenting to the emergency department for evaluation of abdominal pain with distention, nausea, vomiting, and diarrhea.  Patient reports he been in his usual state of health until yesterday when he developed abdominal discomfort that progressed to fairly severe pain with nausea, distention, nonbloody vomiting, and an episode of diarrhea.  Denies any chest pain, cough, fevers, or chills.  Reports similar symptoms more than 10 years ago and states that he underwent a surgery at that time, lifting his gown to show a laparotomy scar.  ED Course: Upon arrival to the ED, patient is found to be afebrile, saturating well on room air, and with vitals otherwise stable.  KUB is concerning for SBO and CT of the abdomen and pelvis features a high-grade SBO with transition in the right lower quadrant, likely secondary to adhesions.  Chemistry panel is notable for mild elevation in transaminases and CBC is unremarkable.  NG tube is being placed in the ED and the patient will be observed for further evaluation and management of small bowel obstruction.  Review of Systems:  All other systems reviewed and apart from HPI, are negative.  Past Medical History:  Diagnosis Date  . Aneurysm of right common iliac artery (HCC)    2.2 cm per CT 02-18-2017  . Arthritis   . GERD (gastroesophageal reflux disease)   . Hiatal hernia   . Hiccups    CHRONIC--- MULTIPLE ED VISITS  . History of alcohol abuse   . History of GI bleed    upper GI bleed from esophageal ulcer 01/ 2017 and 03/ 2017 from erosive  esophageal gastiris  . 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 History of small bowel obstruction    04-06-2005  s/p  exp. lap. w/ lysis adhesions for partial sbo  . Hyperlipidemia   . Hyperlipidemia   . Hyperplasia of prostate with lower urinary tract symptoms (LUTS)   . Hypertension   . LAFB (left anterior fascicular block)   . Mild cognitive impairment with memory loss    PROBABLE STROKE RESIDUAL BUT POSSIBLE PRIOR TO STROKE  . Partial seizure ([place])    x1  10-15-2016 at ED visit , post cva 08-27-2016  . Poor historian   . Prostate cancer [facility]) urologist-  dr Jeffie Pollock  . Schatzki's ring   . Stroke ([place])   . Substance abuse [facility])     Past Surgical History:  Procedure Laterality Date  . COLONOSCOPY N/A 03/08/2016   Procedure: COLONOSCOPY;  Surgeon: Daneil Dolin, MD;  [facility];  Service: Endoscopy;  Laterality: N/A;  1300-moved to 1400  . ESOPHAGOGASTRODUODENOSCOPY N/A 10/05/2015   Dr. Gala Romney: erosive reflux esophagitis, non-critcal Schatki's ring non-manipulated  . ESOPHAGOGASTRODUODENOSCOPY N/A 12/15/2015   Dr. Rourk:non bleeding esophageal ulcer/small HH  . ESOPHAGOGASTRODUODENOSCOPY N/A 03/08/2016   Procedure: ESOPHAGOGASTRODUODENOSCOPY (EGD);  Surgeon: Daneil Dolin, MD;  Location[facility];  Service: Endoscopy;  Laterality: N/A;  . EXPLORATORY LAPAROTOMY  W/ ADHESIOLYSIS  04/06/2005   partial SBO  . ORCHIECTOMY Bilateral 03/05/2017   Procedure: ORCHIECTOMY BILATERAL;  Surgeon: Irine Seal, MD;  Location: [facility];  Service: Urology;  Laterality: Bilateral;  . PROSTATE BIOPSY N/A 03/05/2017   Procedure: BIOPSY TRANSRECTAL ULTRASONIC PROSTATE (TUBP);  Surgeon: Irine Seal, MD;  Location: [facility];  Service: Urology;  Laterality: N/A;  . TRANSTHORACIC  ECHOCARDIOGRAM  08/28/2016   mild to moderate LVH, ef 55-60%/  mild AR and TR/  trivial MR     reports that he has never smoked. He has never used smokeless tobacco. He reports that he does not drink alcohol or use drugs.  No Known Allergies  Family History  Problem Relation Age of Onset  . Colon cancer Neg Hx      Prior to Admission medications   Medication Sig Start Date End Date Taking? Authorizing Provider  aspirin 325 MG tablet Take 1 tablet (325 mg total) by mouth daily. 08/30/18   Patrecia Pour, MD  atorvastatin (LIPITOR) 80 MG tablet Take 1 tablet (80 mg total) by mouth daily. TAKE ONE TABLET BY MOUTH DAILY AT 6 PM. 08/29/18   Patrecia Pour, MD  hydrochlorothiazide (HYDRODIURIL) 25 MG tablet Take 1 tablet (25 mg total) by mouth daily. For blood pressure 06/02/18   Dettinger, Fransisca Kaufmann, MD  levETIRAcetam (KEPPRA) 500 MG tablet TAKE ONE TABLET BY MOUTH 2 TIMES A DAY. 06/02/18   Dettinger, Fransisca Kaufmann, MD  pantoprazole (PROTONIX) 40 MG tablet TAKE (1) TABLET BY MOUTH ONCE DAILY. 06/02/18   Dettinger, Fransisca Kaufmann, MD  tamsulosin (FLOMAX) 0.4 MG CAPS capsule Take 1 capsule (0.4 mg total) by mouth daily. 06/02/18   Dettinger, Fransisca Kaufmann, MD    Physical Exam: Vitals:   09/12/18 0136 09/12/18 0143  BP: (!) 136/95   Pulse: 77   Resp: 17   Temp: 98.4 F (36.9 C)   TempSrc: Oral   SpO2: 97%   Weight:  80 kg  Height:  5\' 9"  (1.753 m)    Constitutional: NAD, calm  Eyes: PERTLA, lids and conjunctivae normal ENMT: Mucous membranes are moist. Posterior pharynx clear of any exudate or lesions.   Neck: normal, supple, no masses, no thyromegaly Respiratory: clear to auscultation bilaterally, no wheezing, no crackles. Normal respiratory effort.   Cardiovascular: S1 & S2 heard, regular rate and rhythm. No extremity edema.   Abdomen: Distended but soft. Generalized tenderness, no rebound pain or guarding. Occasional high-pitched bowel sound.  Musculoskeletal: no clubbing / cyanosis. No joint  deformity upper and lower extremities.    Skin: no significant rashes, lesions, ulcers. Warm, dry, well-perfused. Neurologic: No facial asymmetry. Sensation intact. Moving all extremities.  Psychiatric: Alert and oriented to person, place, and situation. Pleasant and cooperativ.    Labs on Admission: I have personally reviewed following labs and imaging studies  CBC: Recent Labs  Lab 09/12/18 0210  WBC 7.6  NEUTROABS 6.4  HGB 13.6  HCT 42.7  MCV 87.7  PLT 245   Basic Metabolic Panel: Recent Labs  Lab 09/12/18 0210  NA 137  K 3.8  CL 101  CO2 26  GLUCOSE 145*  BUN 20  CREATININE 0.85  CALCIUM 9.1   GFR: Estimated Creatinine Clearance: 70.5 mL/min (by C-G formula based on SCr of 0.85 mg/dL). Liver Function Tests: Recent Labs  Lab 09/12/18 0210  AST 84*  ALT 77*  ALKPHOS 80  BILITOT 0.8  PROT 7.8  ALBUMIN 4.0   Recent Labs  Lab 09/12/18 0210  LIPASE 29   No results for input(s): AMMONIA in the last 168 hours. Coagulation Profile: No results for input(s): INR, PROTIME in the last 168 hours. Cardiac Enzymes: No results for input(s): CKTOTAL, CKMB, CKMBINDEX, TROPONINI in the last 168 hours. BNP (last 3 results) No results for input(s): PROBNP in the last 8760 hours. HbA1C: No results for input(s): HGBA1C in the last 72 hours. CBG: No results for input(s): GLUCAP in the last 168 hours. Lipid Profile: No results for input(s): CHOL, HDL, LDLCALC, TRIG, CHOLHDL, LDLDIRECT in the last 72 hours. Thyroid Function Tests: No results for input(s): TSH, T4TOTAL, FREET4, T3FREE, THYROIDAB in the last 72 hours. Anemia Panel: No results for input(s): VITAMINB12, FOLATE, FERRITIN, TIBC, IRON, RETICCTPCT in the last 72 hours. Urine analysis:    Component Value Date/Time   COLORURINE YELLOW 08/28/2018 0603   APPEARANCEUR CLEAR 08/28/2018 0603   APPEARANCEUR Clear 01/29/2017 1318   LABSPEC 1.017 08/28/2018 0603   PHURINE 5.0 08/28/2018 0603   GLUCOSEU NEGATIVE  08/28/2018 0603   HGBUR SMALL (A) 08/28/2018 0603   BILIRUBINUR NEGATIVE 08/28/2018 0603   BILIRUBINUR Negative 01/29/2017 1318   KETONESUR NEGATIVE 08/28/2018 0603   PROTEINUR NEGATIVE 08/28/2018 0603   UROBILINOGEN 0.2 08/23/2012 1615   NITRITE NEGATIVE 08/28/2018 0603   LEUKOCYTESUR NEGATIVE 08/28/2018 0603   LEUKOCYTESUR Negative 01/29/2017 1318   Sepsis Labs: @LABRCNTIP (procalcitonin:4,lacticidven:4) )No results found for this or any previous visit (from the past 240 hour(s)).   Radiological Exams on Admission: Ct Abdomen Pelvis W Contrast  Result Date: 09/12/2018 CLINICAL DATA:  Acute onset of nausea, vomiting and diarrhea. Abdominal distention. EXAM: CT ABDOMEN AND PELVIS WITH CONTRAST TECHNIQUE: Multidetector CT imaging of the abdomen and pelvis was performed using the standard protocol following bolus administration of intravenous contrast. CONTRAST:  110mL ISOVUE-300 IOPAMIDOL (ISOVUE-300) INJECTION 61% COMPARISON:  CT of the abdomen and pelvis performed 06/20/2018, and MRI of the lumbar spine performed 08/28/2018 FINDINGS: Lower chest: Minimal scarring is noted at the left lung base. A small to moderate hiatal hernia is noted. Hepatobiliary: Scattered hypodensities within the liver measure up to 4.0 cm in size. The gallbladder is grossly unremarkable. The common bile duct is normal in caliber. Pancreas: The pancreas is within normal limits. Spleen: The spleen is unremarkable in appearance. Adrenals/Urinary Tract: The adrenal glands are unremarkable in appearance. Small bilateral renal cysts are noted. Left renal scarring is seen. There is no evidence of hydronephrosis. No renal or ureteral stones are identified. Minimal right-sided perinephric stranding is noted. Stomach/Bowel: The stomach is unremarkable in appearance. There is diffuse dilatation of small-bowel loops to 4.1 cm in maximal diameter, with a focal transition point at the right lower quadrant at the mid to distal ileum.  This is compatible with high-grade small bowel obstruction, likely secondary to an adhesion. Trace associated free fluid is noted at the right lower quadrant. The appendix is not visualized; there is no evidence for appendicitis. The colon is unremarkable in appearance. Vascular/Lymphatic: The abdominal aorta is unremarkable in appearance. There is mild aneurysmal dilatation of the right common iliac artery to 2.3 cm in diameter, with associated mural thrombus. The inferior vena cava is grossly unremarkable. No retroperitoneal lymphadenopathy is seen. No pelvic sidewall lymphadenopathy is identified. Reproductive: The bladder is decompressed and not well characterized. The prostate remains normal in size. Other: No additional soft tissue abnormalities are seen. Musculoskeletal: No acute osseous abnormalities are identified. The visualized musculature is unremarkable in appearance. IMPRESSION: 1. Diffuse dilatation of small-bowel loops  to 4.1 cm in maximal diameter, with a focal transition point at the right lower quadrant at the mid to distal ileum. This is compatible with high-grade small bowel obstruction, likely secondary to an adhesion. Trace associated free fluid at the right lower quadrant. 2. Small to moderate hiatal hernia noted. 3. Scattered hypodensities within the liver measure up to 4.0 cm in size, similar to the prior study. 4. Small bilateral renal cysts. Left renal scarring seen. 5. Mild aneurysmal dilatation of the right common iliac artery to 2.3 cm in diameter, with associated mural thrombus. These results were called by telephone at the time of interpretation on 09/12/2018 at 4:30 am to Dr. Davonna Belling , who verbally acknowledged these results. Electronically Signed   By: Garald Balding M.D.   On: 09/12/2018 04:30   Dg Abdomen Acute W/chest  Result Date: 09/12/2018 CLINICAL DATA:  Nausea and vomiting EXAM: DG ABDOMEN ACUTE W/ 1V CHEST COMPARISON:  08/28/2018 FINDINGS: Cardiac shadow is  stable. Loop recorder is noted. Stable scarring is noted in the left base. No focal infiltrate is seen. Multiple dilated loops of small bowel are noted with differential air-fluid levels consistent small-bowel obstruction. Some fecal impaction in the rectum is noted. No free air is seen. IMPRESSION: Changes consistent with small bowel obstruction. CT is recommended for further evaluation. Electronically Signed   By: Inez Catalina M.D.   On: 09/12/2018 02:44    EKG: Not performed.   Assessment/Plan   1. SBO  - Presents with one day of abdominal distension with N/V/D and had CT in ED concerning for high-grade SBO   - NG is being placed in ED  - Continue bowel-rest, IVF hydration, pain-control, antiemetics as-needed, serial exams, discus with surgery   2. History of CVA - Recent admission for ischemic CVA  - No new deficits; reports left-sided weakness has improved and now ambulating well without cane  - Resume ASA and statin as soon as his condition allows    3. Prostate cancer  - Follows with urology   - Resume Flomax when his condition allows    4. Seizure disorder  - Continue Keppra    5. Elevated transaminase  - Mild elevations in AST and ALT noted  - CT with scattered liver hypodensities that appear stable  - He has been referred to GI by his PCP     DVT prophylaxis: SCD's  Code Status: Full  Family Communication: Discussed with patient  Consults called: None Admission status: Observation     Vianne Bulls, MD [facility] Pager [phone]  If 7PM-7AM, please contact night-coverage [email or web address] Password [facility]  09/12/2018, 4:56 AM

## 2018-09-12 NOTE — Consult Note (Signed)
Reason for Consult: Small bowel obstruction Referring Physician: Dr. Samule Dry Joshua Mckee is an 77 y.o. male.  HPI: Patient is a 77 year old black male with multiple medical problems who presents with nausea, vomiting, and constipation.  He states that he does have a history of GERD and a hiatal hernia.  He is status post abdominal surgery many years ago.  A CT scan of the abdomen was performed which revealed a small bowel obstruction.  An NG tube was placed.  When I went to see the patient, he said he had a large bowel movement and felt much better.  He currently denies any abdominal pain.  Past Medical History:  Diagnosis Date  . Aneurysm of right common iliac artery (HCC)    2.2 cm per CT 02-18-2017  . Arthritis   . GERD (gastroesophageal reflux disease)   . Hiatal hernia   . Hiccups    CHRONIC--- MULTIPLE ED VISITS  . History of alcohol abuse   . History of GI bleed    upper GI bleed from esophageal ulcer 01/ 2017 and 03/ 2017 from erosive esophageal gastiris  . 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 History of small bowel obstruction    04-06-2005  s/p  exp. lap. w/ lysis adhesions for partial sbo  . Hyperlipidemia   . Hyperlipidemia   . Hyperplasia of prostate with lower urinary tract symptoms (LUTS)   . Hypertension   . LAFB (left anterior fascicular block)   . Mild cognitive impairment with memory loss    PROBABLE STROKE RESIDUAL BUT POSSIBLE PRIOR TO STROKE  . Partial seizure ([place])    x1  10-15-2016 at ED visit , post cva 08-27-2016  . Poor historian   . Prostate cancer [facility]) urologist-  dr Jeffie Pollock  . Schatzki's ring   . Stroke ([place])   . Substance abuse [facility])     Past Surgical History:  Procedure Laterality Date  . COLONOSCOPY N/A 03/08/2016   Procedure: COLONOSCOPY;  Surgeon: Daneil Dolin, MD;  [facility];  Service: Endoscopy;  Laterality: N/A;  1300-moved to 1400  . ESOPHAGOGASTRODUODENOSCOPY N/A 10/05/2015   Dr. Gala Romney: erosive reflux esophagitis, non-critcal Schatki's ring non-manipulated  . ESOPHAGOGASTRODUODENOSCOPY N/A 12/15/2015   Dr. Rourk:non bleeding esophageal ulcer/small HH  . ESOPHAGOGASTRODUODENOSCOPY N/A 03/08/2016   Procedure: ESOPHAGOGASTRODUODENOSCOPY (EGD);  Surgeon: Daneil Dolin, MD;  Location: [facility];  Service: Endoscopy;  Laterality: N/A;  . EXPLORATORY LAPAROTOMY W/ ADHESIOLYSIS  04/06/2005   partial SBO  . ORCHIECTOMY Bilateral 03/05/2017   Procedure: ORCHIECTOMY BILATERAL;  Surgeon: Irine Seal, MD;  Location: [facility];  Service: Urology;  Laterality: Bilateral;  . PROSTATE BIOPSY N/A 03/05/2017   Procedure: BIOPSY TRANSRECTAL ULTRASONIC PROSTATE (TUBP);  Surgeon: Irine Seal, MD;  Location: [facility];  Service: Urology;  Laterality: N/A;  . TRANSTHORACIC ECHOCARDIOGRAM  08/28/2016   mild to moderate LVH, ef 55-60%/  mild AR and TR/  trivial MR    Family History  Problem Relation Age of Onset  . Colon cancer Neg Hx     Social History:  reports that he has never smoked. He has never used smokeless tobacco. He reports that he does not drink alcohol or use drugs.  Allergies: No Known Allergies  Medications: I have reviewed the patient's current medications.  Results  for orders placed or performed during the hospital encounter of 09/12/18 (from the past 48 hour(s))  Comprehensive metabolic panel     Status: Abnormal   Collection Time: 09/12/18  2:10 AM  Result Value Ref Range   Sodium 137 135 - 145 mmol/L   Potassium 3.8 3.5 - 5.1 mmol/L   Chloride 101 98 - 111 mmol/L   CO2 26 22 - 32 mmol/L   Glucose, Bld 145 (H) 70 - 99 mg/dL   BUN 20 8 - 23 mg/dL   Creatinine, Ser 0.85 0.61 - 1.24 mg/dL   Calcium 9.1 8.9 - 10.3 mg/dL   Total Protein 7.8 6.5 - 8.1 g/dL   Albumin 4.0 3.5 - 5.0 g/dL    AST 84 (H) 15 - 41 U/L   ALT 77 (H) 0 - 44 U/L   Alkaline Phosphatase 80 38 - 126 U/L   Total Bilirubin 0.8 0.3 - 1.2 mg/dL   GFR calc non Af Amer >60 >60 mL/min   GFR calc Af Amer >60 >60 mL/min   Anion gap 10 5 - 15    Comment: Performed at [facility], [address], [place], [place] [address]  Lipase, blood     Status: None   Collection Time: 09/12/18  2:10 AM  Result Value Ref Range   Lipase 29 11 - 51 U/L    Comment: Performed at [facility], [address], [place], [place] [address]  CBC with Differential     Status: None   Collection Time: 09/12/18  2:10 AM  Result Value Ref Range   WBC 7.6 4.0 - 10.5 K/uL   RBC 4.87 4.22 - 5.81 MIL/uL   Hemoglobin 13.6 13.0 - 17.0 g/dL   HCT 42.7 39.0 - 52.0 %   MCV 87.7 80.0 - 100.0 fL   MCH 27.9 26.0 - 34.0 pg   MCHC 31.9 30.0 - 36.0 g/dL   RDW 14.6 11.5 - 15.5 %   Platelets 202 150 - 400 K/uL   nRBC 0.0 0.0 - 0.2 %   Neutrophils Relative % 84 %   Neutro Abs 6.4 1.7 - 7.7 K/uL   Lymphocytes Relative 9 %   Lymphs Abs 0.7 0.7 - 4.0 K/uL   Monocytes Relative 7 %   Monocytes Absolute 0.5 0.1 - 1.0 K/uL   Eosinophils Relative 0 %   Eosinophils Absolute 0.0 0.0 - 0.5 K/uL   Basophils Relative 0 %   Basophils Absolute 0.0 0.0 - 0.1 K/uL   Immature Granulocytes 0 %   Abs Immature Granulocytes 0.02 0.00 - 0.07 K/uL    Comment: Performed at [facility], [address] [place], [place] [address]    Ct Abdomen Pelvis W Contrast  Result Date: 09/12/2018 CLINICAL DATA:  Acute onset of nausea, vomiting and diarrhea. Abdominal distention. EXAM: CT ABDOMEN AND PELVIS WITH CONTRAST TECHNIQUE: Multidetector CT imaging of the abdomen and pelvis was performed using the standard protocol following bolus administration of intravenous contrast. CONTRAST:  136mL ISOVUE-300 IOPAMIDOL (ISOVUE-300) INJECTION 61% COMPARISON:  CT of the abdomen and pelvis performed 06/20/2018, and MRI of the lumbar spine performed 08/28/2018 FINDINGS: Lower chest:  Minimal scarring is noted at the left lung base. A small to moderate hiatal hernia is noted. Hepatobiliary: Scattered hypodensities within the liver measure up to 4.0 cm in size. The gallbladder is grossly unremarkable. The common bile duct is normal in caliber. Pancreas: The pancreas is within normal limits. Spleen: The spleen is unremarkable in appearance. Adrenals/Urinary Tract: The adrenal  glands are unremarkable in appearance. Small bilateral renal cysts are noted. Left renal scarring is seen. There is no evidence of hydronephrosis. No renal or ureteral stones are identified. Minimal right-sided perinephric stranding is noted. Stomach/Bowel: The stomach is unremarkable in appearance. There is diffuse dilatation of small-bowel loops to 4.1 cm in maximal diameter, with a focal transition point at the right lower quadrant at the mid to distal ileum. This is compatible with high-grade small bowel obstruction, likely secondary to an adhesion. Trace associated free fluid is noted at the right lower quadrant. The appendix is not visualized; there is no evidence for appendicitis. The colon is unremarkable in appearance. Vascular/Lymphatic: The abdominal aorta is unremarkable in appearance. There is mild aneurysmal dilatation of the right common iliac artery to 2.3 cm in diameter, with associated mural thrombus. The inferior vena cava is grossly unremarkable. No retroperitoneal lymphadenopathy is seen. No pelvic sidewall lymphadenopathy is identified. Reproductive: The bladder is decompressed and not well characterized. The prostate remains normal in size. Other: No additional soft tissue abnormalities are seen. Musculoskeletal: No acute osseous abnormalities are identified. The visualized musculature is unremarkable in appearance. IMPRESSION: 1. Diffuse dilatation of small-bowel loops to 4.1 cm in maximal diameter, with a focal transition point at the right lower quadrant at the mid to distal ileum. This is compatible  with high-grade small bowel obstruction, likely secondary to an adhesion. Trace associated free fluid at the right lower quadrant. 2. Small to moderate hiatal hernia noted. 3. Scattered hypodensities within the liver measure up to 4.0 cm in size, similar to the prior study. 4. Small bilateral renal cysts. Left renal scarring seen. 5. Mild aneurysmal dilatation of the right common iliac artery to 2.3 cm in diameter, with associated mural thrombus. These results were called by telephone at the time of interpretation on 09/12/2018 at 4:30 am to Dr. Davonna Belling , who verbally acknowledged these results. Electronically Signed   By: Garald Balding M.D.   On: 09/12/2018 04:30   Dg Chest Portable 1 View  Addendum Date: 09/12/2018   ADDENDUM REPORT: 09/12/2018 06:34 ADDENDUM: The nasogastric tube is noted ending overlying the distal esophagus. This should be advanced at least 8 cm. These results were called by telephone at the time of interpretation on 09/12/2018 at 6:33 am to Dr. Davonna Belling, who verbally acknowledged these results. Electronically Signed   By: Garald Balding M.D.   On: 09/12/2018 06:34   Result Date: 09/12/2018 CLINICAL DATA:  Acute onset of generalized abdominal distention. Nausea, vomiting and diarrhea. Nasogastric tube placement. EXAM: PORTABLE CHEST 1 VIEW COMPARISON:  Abdominal and chest radiographs performed earlier today at 2:14 a.m. FINDINGS: The lungs are well-aerated and clear. There is no evidence of focal opacification, pleural effusion or pneumothorax. The cardiomediastinal silhouette is within normal limits. No acute osseous abnormalities are seen. A metallic device is noted overlying the left hilum. IMPRESSION: No acute cardiopulmonary process seen. Electronically Signed: By: Garald Balding M.D. On: 09/12/2018 05:56   Dg Abdomen Acute W/chest  Result Date: 09/12/2018 CLINICAL DATA:  Nausea and vomiting EXAM: DG ABDOMEN ACUTE W/ 1V CHEST COMPARISON:  08/28/2018 FINDINGS:  Cardiac shadow is stable. Loop recorder is noted. Stable scarring is noted in the left base. No focal infiltrate is seen. Multiple dilated loops of small bowel are noted with differential air-fluid levels consistent small-bowel obstruction. Some fecal impaction in the rectum is noted. No free air is seen. IMPRESSION: Changes consistent with small bowel obstruction. CT is recommended for further evaluation. Electronically  Signed   By: Inez Catalina M.D.   On: 09/12/2018 02:44    ROS:  Pertinent items are noted in HPI.  Blood pressure (!) 124/94, pulse 89, temperature 98.4 F (36.9 C), temperature source Oral, resp. rate 16, height 5\' 9"  (1.753 m), weight 80 kg, SpO2 96 %. Physical Exam: Pleasant black male no acute distress NG tube with minimal clear fluid present. Head is normocephalic, atraumatic Lungs clear to auscultation with good breath sounds bilaterally Heart examination reveals regular rate and rhythm without S3, S4, murmurs Abdomen is soft with occasional bowel sounds appreciated.  Midline surgical scar is present around the umbilicus.  No rigidity is noted.  No distention is noted. CT scan images personally reviewed Assessment/Plan: Impression: Partial small bowel obstruction versus possible constipation Plan: No need for acute surgical infection at this time.  Patient feels much better after having large bowel movement.  We will monitor expectantly with you.  Should he feel better later on today, the NG tube can be removed and his diet advanced.  This was discussed with Dr. Roderic Palau.  Aviva Signs 09/12/2018, 8:21 AM

## 2018-09-12 NOTE — ED Notes (Signed)
Pt states he still does not need to urinate, aware of DO, urinal in reach at bedside

## 2018-09-12 NOTE — Progress Notes (Signed)
NG tube verified by x-ray placement. Hooked up to low-intermittent suction.

## 2018-09-13 ENCOUNTER — Inpatient Hospital Stay (HOSPITAL_COMMUNITY): Payer: Medicare Other

## 2018-09-13 ENCOUNTER — Other Ambulatory Visit: Payer: Self-pay

## 2018-09-13 LAB — COMPREHENSIVE METABOLIC PANEL
ALT: 66 U/L — ABNORMAL HIGH (ref 0–44)
AST: 62 U/L — ABNORMAL HIGH (ref 15–41)
Albumin: 3.9 g/dL (ref 3.5–5.0)
Alkaline Phosphatase: 70 U/L (ref 38–126)
Anion gap: 11 (ref 5–15)
BUN: 32 mg/dL — ABNORMAL HIGH (ref 8–23)
CO2: 32 mmol/L (ref 22–32)
Calcium: 8.8 mg/dL — ABNORMAL LOW (ref 8.9–10.3)
Chloride: 96 mmol/L — ABNORMAL LOW (ref 98–111)
Creatinine, Ser: 0.82 mg/dL (ref 0.61–1.24)
GFR calc Af Amer: >60 mL/min (ref >60)
GFR calc non Af Amer: >60 mL/min (ref >60)
Glucose, Bld: 108 mg/dL — ABNORMAL HIGH (ref 70–99)
Potassium: 3.7 mmol/L (ref 3.5–5.1)
Sodium: 139 mmol/L (ref 135–145)
Total Bilirubin: 1.3 mg/dL — ABNORMAL HIGH (ref 0.3–1.2)
Total Protein: 7.6 g/dL (ref 6.5–8.1)

## 2018-09-13 LAB — CBC
HCT: 43 % (ref 39.0–52.0)
Hemoglobin: 13.8 g/dL (ref 13.0–17.0)
MCH: 28.2 pg (ref 26.0–34.0)
MCHC: 32.1 g/dL (ref 30.0–36.0)
MCV: 87.9 fL (ref 80.0–100.0)
Platelets: 221 10*3/uL (ref 150–400)
RBC: 4.89 MIL/uL (ref 4.22–5.81)
RDW: 15.1 % (ref 11.5–15.5)
WBC: 5.1 10*3/uL (ref 4.0–10.5)
nRBC: 0 % (ref 0.0–0.2)

## 2018-09-13 LAB — GLUCOSE, CAPILLARY: Glucose-Capillary: 111 mg/dL — ABNORMAL HIGH (ref 70–99)

## 2018-09-13 MED ORDER — ENOXAPARIN SODIUM 40 MG/0.4ML ~~LOC~~ SOLN
40.0000 mg | SUBCUTANEOUS | Status: DC
  Administered 2018-09-13 – 2018-09-15 (×3): 40 mg via SUBCUTANEOUS
  Filled 2018-09-13 (×3): qty 0.4

## 2018-09-13 MED ORDER — KCL IN DEXTROSE-NACL 20-5-0.45 MEQ/L-%-% IV SOLN
INTRAVENOUS | Status: DC
  Administered 2018-09-13 – 2018-09-16 (×6): via INTRAVENOUS

## 2018-09-13 NOTE — Progress Notes (Signed)
PROGRESS NOTE    Joshua Mckee  AQT:[id number] DOB: [birth year] DOA: 09/12/2018 PCP: Dettinger, Fransisca Kaufmann, MD    Brief Narrative:  77 year old male with a history of seizure disorder, prostate cancer, bowel obstruction in the past, admitted to the hospital with nausea, vomiting, abdominal distention.  Found to have bowel obstruction on imaging.  NG tube placed for decompression.  Currently on bowel rest.   Assessment & Plan:   Principal Problem:   SBO (small bowel obstruction) (HCC) Active Problems:   Seizure (HCC)   Prostate cancer ([place])   History of CVA (cerebrovascular accident)   High transaminase levels   1. Small bowel obstruction.  Continues to have abdominal distention.  Had one bowel movement yesterday morning, but has not had any further.  NG tube remains in place for decompression.  Continue bowel rest.  General surgery following. 2. History of CVA.  Resume aspirin and statin once able. 3. Prostate cancer.  Follows with urology.  Resume Flomax when able. 4. Seizure disorder.  Continue on Keppra.   DVT prophylaxis: Lovenox Code Status: Full code Family Communication: No family present Disposition Plan: Discharge home once bowel function has recovered   Consultants:   General surgery  Procedures:     Antimicrobials:       Subjective: Had some vomiting last night.  Last bowel movement was yesterday morning.  He has been passing some gas.  NG tube remains in place.  Objective: Vitals:   09/12/18 1759 09/12/18 1957 09/12/18 2146 09/13/18 0556  BP: (!) 133/102  113/90 (!) 119/94  Pulse: (!) 102  83 98  Resp: 18  18 18   Temp: 98.7 F (37.1 C)  (!) 97.5 F (36.4 C) 98.4 F (36.9 C)  TempSrc: Oral  Oral Oral  SpO2: 98% 97% 96% 94%  Weight: 73.9 kg     Height: 5\' 5"  (1.651 m)       Intake/Output Summary (Last 24 hours) at 09/13/2018 1121 Last data filed at 09/13/2018 0000 Gross per 24 hour  Intake 1095 ml  Output 0 ml  Net 1095 ml   Filed  Weights   09/12/18 0143 09/12/18 1759  Weight: 80 kg 73.9 kg    Examination:  General exam: Appears calm and comfortable  Respiratory system: Clear to auscultation. Respiratory effort normal. Cardiovascular system: S1 & S2 heard, RRR. No JVD, murmurs, rubs, gallops or clicks. No pedal edema. Gastrointestinal system: Abdomen is distended, soft and nontender. No organomegaly or masses felt. Bowel sounds absent. Central nervous system: Alert and oriented. No focal neurological deficits. Extremities: Symmetric 5 x 5 power. Skin: No rashes, lesions or ulcers Psychiatry: Judgement and insight appear normal. Mood & affect appropriate.     Data Reviewed: I have personally reviewed following labs and imaging studies  CBC: Recent Labs  Lab 09/12/18 0210 09/13/18 0604  WBC 7.6 5.1  NEUTROABS 6.4  --   HGB 13.6 13.8  HCT 42.7 43.0  MCV 87.7 87.9  PLT 202 562   Basic Metabolic Panel: Recent Labs  Lab 09/12/18 0210 09/13/18 0604  NA 137 139  K 3.8 3.7  CL 101 96*  CO2 26 32  GLUCOSE 145* 108*  BUN 20 32*  CREATININE 0.85 0.82  CALCIUM 9.1 8.8*   GFR: Estimated Creatinine Clearance: 68.7 mL/min (by C-G formula based on SCr of 0.82 mg/dL). Liver Function Tests: Recent Labs  Lab 09/12/18 0210 09/13/18 0604  AST 84* 62*  ALT 77* 66*  ALKPHOS 80 70  BILITOT 0.8 1.3*  PROT 7.8 7.6  ALBUMIN 4.0 3.9   Recent Labs  Lab 09/12/18 0210  LIPASE 29   No results for input(s): AMMONIA in the last 168 hours. Coagulation Profile: No results for input(s): INR, PROTIME in the last 168 hours. Cardiac Enzymes: No results for input(s): CKTOTAL, CKMB, CKMBINDEX, TROPONINI in the last 168 hours. BNP (last 3 results) No results for input(s): PROBNP in the last 8760 hours. HbA1C: No results for input(s): HGBA1C in the last 72 hours. CBG: Recent Labs  Lab 09/12/18 0836  GLUCAP 125*   Lipid Profile: No results for input(s): CHOL, HDL, LDLCALC, TRIG, CHOLHDL, LDLDIRECT in the  last 72 hours. Thyroid Function Tests: No results for input(s): TSH, T4TOTAL, FREET4, T3FREE, THYROIDAB in the last 72 hours. Anemia Panel: No results for input(s): VITAMINB12, FOLATE, FERRITIN, TIBC, IRON, RETICCTPCT in the last 72 hours. Sepsis Labs: No results for input(s): PROCALCITON, LATICACIDVEN in the last 168 hours.  No results found for this or any previous visit (from the past 240 hour(s)).       Radiology Studies: Ct Abdomen Pelvis W Contrast  Result Date: 09/12/2018 CLINICAL DATA:  Acute onset of nausea, vomiting and diarrhea. Abdominal distention. EXAM: CT ABDOMEN AND PELVIS WITH CONTRAST TECHNIQUE: Multidetector CT imaging of the abdomen and pelvis was performed using the standard protocol following bolus administration of intravenous contrast. CONTRAST:  167mL ISOVUE-300 IOPAMIDOL (ISOVUE-300) INJECTION 61% COMPARISON:  CT of the abdomen and pelvis performed 06/20/2018, and MRI of the lumbar spine performed 08/28/2018 FINDINGS: Lower chest: Minimal scarring is noted at the left lung base. A small to moderate hiatal hernia is noted. Hepatobiliary: Scattered hypodensities within the liver measure up to 4.0 cm in size. The gallbladder is grossly unremarkable. The common bile duct is normal in caliber. Pancreas: The pancreas is within normal limits. Spleen: The spleen is unremarkable in appearance. Adrenals/Urinary Tract: The adrenal glands are unremarkable in appearance. Small bilateral renal cysts are noted. Left renal scarring is seen. There is no evidence of hydronephrosis. No renal or ureteral stones are identified. Minimal right-sided perinephric stranding is noted. Stomach/Bowel: The stomach is unremarkable in appearance. There is diffuse dilatation of small-bowel loops to 4.1 cm in maximal diameter, with a focal transition point at the right lower quadrant at the mid to distal ileum. This is compatible with high-grade small bowel obstruction, likely secondary to an adhesion.  Trace associated free fluid is noted at the right lower quadrant. The appendix is not visualized; there is no evidence for appendicitis. The colon is unremarkable in appearance. Vascular/Lymphatic: The abdominal aorta is unremarkable in appearance. There is mild aneurysmal dilatation of the right common iliac artery to 2.3 cm in diameter, with associated mural thrombus. The inferior vena cava is grossly unremarkable. No retroperitoneal lymphadenopathy is seen. No pelvic sidewall lymphadenopathy is identified. Reproductive: The bladder is decompressed and not well characterized. The prostate remains normal in size. Other: No additional soft tissue abnormalities are seen. Musculoskeletal: No acute osseous abnormalities are identified. The visualized musculature is unremarkable in appearance. IMPRESSION: 1. Diffuse dilatation of small-bowel loops to 4.1 cm in maximal diameter, with a focal transition point at the right lower quadrant at the mid to distal ileum. This is compatible with high-grade small bowel obstruction, likely secondary to an adhesion. Trace associated free fluid at the right lower quadrant. 2. Small to moderate hiatal hernia noted. 3. Scattered hypodensities within the liver measure up to 4.0 cm in size, similar to the prior study. 4. Small bilateral renal cysts. Left  renal scarring seen. 5. Mild aneurysmal dilatation of the right common iliac artery to 2.3 cm in diameter, with associated mural thrombus. These results were called by telephone at the time of interpretation on 09/12/2018 at 4:30 am to Dr. Davonna Belling , who verbally acknowledged these results. Electronically Signed   By: Garald Balding M.D.   On: 09/12/2018 04:30   Dg Chest Port 1 View  Result Date: 09/13/2018 CLINICAL DATA:  Evaluate NG tube placement. EXAM: PORTABLE CHEST 1 VIEW COMPARISON:  Chest radiograph 09/12/2018 FINDINGS: Enteric tube tip projects over the upper abdomen, side-port projects at the GE junction. Stable  cardiac and mediastinal contours. Lungs are clear. No pleural effusion or pneumothorax. Thoracic spine degenerative changes. IMPRESSION: Slight interval advancement of the enteric tube. Side-port at the GE junction, consider advancement. Electronically Signed   By: Lovey Newcomer M.D.   On: 09/13/2018 08:16   Dg Chest Port 1 View  Result Date: 09/12/2018 CLINICAL DATA:  Nasogastric tube placement. EXAM: PORTABLE CHEST 1 VIEW COMPARISON:  Radiograph of same day. FINDINGS: The heart size and mediastinal contours are within normal limits. Both lungs are clear. Nasogastric tube tip is seen in expected position of proximal stomach. The visualized skeletal structures are unremarkable. IMPRESSION: Nasogastric tube tip seen in expected position of proximal stomach. No acute cardiopulmonary abnormality seen. Electronically Signed   By: Marijo Conception, M.D.   On: 09/12/2018 18:50   Dg Chest Portable 1 View  Addendum Date: 09/12/2018   ADDENDUM REPORT: 09/12/2018 06:34 ADDENDUM: The nasogastric tube is noted ending overlying the distal esophagus. This should be advanced at least 8 cm. These results were called by telephone at the time of interpretation on 09/12/2018 at 6:33 am to Dr. Davonna Belling, who verbally acknowledged these results. Electronically Signed   By: Garald Balding M.D.   On: 09/12/2018 06:34   Result Date: 09/12/2018 CLINICAL DATA:  Acute onset of generalized abdominal distention. Nausea, vomiting and diarrhea. Nasogastric tube placement. EXAM: PORTABLE CHEST 1 VIEW COMPARISON:  Abdominal and chest radiographs performed earlier today at 2:14 a.m. FINDINGS: The lungs are well-aerated and clear. There is no evidence of focal opacification, pleural effusion or pneumothorax. The cardiomediastinal silhouette is within normal limits. No acute osseous abnormalities are seen. A metallic device is noted overlying the left hilum. IMPRESSION: No acute cardiopulmonary process seen. Electronically Signed: By:  Garald Balding M.D. On: 09/12/2018 05:56   Dg Abdomen Acute W/chest  Result Date: 09/12/2018 CLINICAL DATA:  Nausea and vomiting EXAM: DG ABDOMEN ACUTE W/ 1V CHEST COMPARISON:  08/28/2018 FINDINGS: Cardiac shadow is stable. Loop recorder is noted. Stable scarring is noted in the left base. No focal infiltrate is seen. Multiple dilated loops of small bowel are noted with differential air-fluid levels consistent small-bowel obstruction. Some fecal impaction in the rectum is noted. No free air is seen. IMPRESSION: Changes consistent with small bowel obstruction. CT is recommended for further evaluation. Electronically Signed   By: Inez Catalina M.D.   On: 09/12/2018 02:44        Scheduled Meds: . bisacodyl  10 mg Rectal Daily  . enoxaparin (LOVENOX) injection  40 mg Subcutaneous Q24H   Continuous Infusions: . dextrose 5 % and 0.45 % NaCl with KCl 20 mEq/L 100 mL/hr at 09/13/18 0957  . levETIRAcetam 500 mg (09/13/18 0959)     LOS: 1 day    Time spent: 2mins    Kathie Dike, MD [facility] Pager [phone]  If 7PM-7AM, please contact night-coverage [email or web address]  Password [email or web address] 09/13/2018, 11:21 AM

## 2018-09-13 NOTE — Progress Notes (Signed)
Subjective: Episodes of emesis while adjusting NGT.  No abdominal pain.  Objective: Vital signs in last 24 hours: Temp:  [97.5 F (36.4 C)-98.7 F (37.1 C)] 98.4 F (36.9 C) (12/28 0556) Pulse Rate:  [66-102] 98 (12/28 0556) Resp:  [16-19] 18 (12/28 0556) BP: (113-133)/(78-109) 119/94 (12/28 0556) SpO2:  [94 %-100 %] 94 % (12/28 0556) Weight:  [73.9 kg] 73.9 kg (12/27 1759) Last BM Date: 09/12/18  Intake/Output from previous day: 12/27 0701 - 12/28 0700 In: 1095 [P.O.:30; I.V.:865; IV Piggyback:200] Out: 680 [Urine:80; Emesis/NG output:600] Intake/Output this shift: No intake/output data recorded.  General appearance: alert, cooperative and no distress GI: soft, non-tender; bowel sounds normal; no masses,  no organomegaly  Lab Results:  Recent Labs    09/12/18 0210 09/13/18 0604  WBC 7.6 5.1  HGB 13.6 13.8  HCT 42.7 43.0  PLT 202 221   BMET Recent Labs    09/12/18 0210 09/13/18 0604  NA 137 139  K 3.8 3.7  CL 101 96*  CO2 26 32  GLUCOSE 145* 108*  BUN 20 32*  CREATININE 0.85 0.82  CALCIUM 9.1 8.8*   PT/INR No results for input(s): LABPROT, INR in the last 72 hours.  Studies/Results: Ct Abdomen Pelvis W Contrast  Result Date: 09/12/2018 CLINICAL DATA:  Acute onset of nausea, vomiting and diarrhea. Abdominal distention. EXAM: CT ABDOMEN AND PELVIS WITH CONTRAST TECHNIQUE: Multidetector CT imaging of the abdomen and pelvis was performed using the standard protocol following bolus administration of intravenous contrast. CONTRAST:  143mL ISOVUE-300 IOPAMIDOL (ISOVUE-300) INJECTION 61% COMPARISON:  CT of the abdomen and pelvis performed 06/20/2018, and MRI of the lumbar spine performed 08/28/2018 FINDINGS: Lower chest: Minimal scarring is noted at the left lung base. A small to moderate hiatal hernia is noted. Hepatobiliary: Scattered hypodensities within the liver measure up to 4.0 cm in size. The gallbladder is grossly unremarkable. The common bile duct is  normal in caliber. Pancreas: The pancreas is within normal limits. Spleen: The spleen is unremarkable in appearance. Adrenals/Urinary Tract: The adrenal glands are unremarkable in appearance. Small bilateral renal cysts are noted. Left renal scarring is seen. There is no evidence of hydronephrosis. No renal or ureteral stones are identified. Minimal right-sided perinephric stranding is noted. Stomach/Bowel: The stomach is unremarkable in appearance. There is diffuse dilatation of small-bowel loops to 4.1 cm in maximal diameter, with a focal transition point at the right lower quadrant at the mid to distal ileum. This is compatible with high-grade small bowel obstruction, likely secondary to an adhesion. Trace associated free fluid is noted at the right lower quadrant. The appendix is not visualized; there is no evidence for appendicitis. The colon is unremarkable in appearance. Vascular/Lymphatic: The abdominal aorta is unremarkable in appearance. There is mild aneurysmal dilatation of the right common iliac artery to 2.3 cm in diameter, with associated mural thrombus. The inferior vena cava is grossly unremarkable. No retroperitoneal lymphadenopathy is seen. No pelvic sidewall lymphadenopathy is identified. Reproductive: The bladder is decompressed and not well characterized. The prostate remains normal in size. Other: No additional soft tissue abnormalities are seen. Musculoskeletal: No acute osseous abnormalities are identified. The visualized musculature is unremarkable in appearance. IMPRESSION: 1. Diffuse dilatation of small-bowel loops to 4.1 cm in maximal diameter, with a focal transition point at the right lower quadrant at the mid to distal ileum. This is compatible with high-grade small bowel obstruction, likely secondary to an adhesion. Trace associated free fluid at the right lower quadrant. 2. Small to moderate hiatal  hernia noted. 3. Scattered hypodensities within the liver measure up to 4.0 cm in  size, similar to the prior study. 4. Small bilateral renal cysts. Left renal scarring seen. 5. Mild aneurysmal dilatation of the right common iliac artery to 2.3 cm in diameter, with associated mural thrombus. These results were called by telephone at the time of interpretation on 09/12/2018 at 4:30 am to Dr. Davonna Belling , who verbally acknowledged these results. Electronically Signed   By: Garald Balding M.D.   On: 09/12/2018 04:30   Dg Chest Port 1 View  Result Date: 09/13/2018 CLINICAL DATA:  Evaluate NG tube placement. EXAM: PORTABLE CHEST 1 VIEW COMPARISON:  Chest radiograph 09/12/2018 FINDINGS: Enteric tube tip projects over the upper abdomen, side-port projects at the GE junction. Stable cardiac and mediastinal contours. Lungs are clear. No pleural effusion or pneumothorax. Thoracic spine degenerative changes. IMPRESSION: Slight interval advancement of the enteric tube. Side-port at the GE junction, consider advancement. Electronically Signed   By: Lovey Newcomer M.D.   On: 09/13/2018 08:16   Dg Chest Port 1 View  Result Date: 09/12/2018 CLINICAL DATA:  Nasogastric tube placement. EXAM: PORTABLE CHEST 1 VIEW COMPARISON:  Radiograph of same day. FINDINGS: The heart size and mediastinal contours are within normal limits. Both lungs are clear. Nasogastric tube tip is seen in expected position of proximal stomach. The visualized skeletal structures are unremarkable. IMPRESSION: Nasogastric tube tip seen in expected position of proximal stomach. No acute cardiopulmonary abnormality seen. Electronically Signed   By: Marijo Conception, M.D.   On: 09/12/2018 18:50   Dg Chest Portable 1 View  Addendum Date: 09/12/2018   ADDENDUM REPORT: 09/12/2018 06:34 ADDENDUM: The nasogastric tube is noted ending overlying the distal esophagus. This should be advanced at least 8 cm. These results were called by telephone at the time of interpretation on 09/12/2018 at 6:33 am to Dr. Davonna Belling, who verbally  acknowledged these results. Electronically Signed   By: Garald Balding M.D.   On: 09/12/2018 06:34   Result Date: 09/12/2018 CLINICAL DATA:  Acute onset of generalized abdominal distention. Nausea, vomiting and diarrhea. Nasogastric tube placement. EXAM: PORTABLE CHEST 1 VIEW COMPARISON:  Abdominal and chest radiographs performed earlier today at 2:14 a.m. FINDINGS: The lungs are well-aerated and clear. There is no evidence of focal opacification, pleural effusion or pneumothorax. The cardiomediastinal silhouette is within normal limits. No acute osseous abnormalities are seen. A metallic device is noted overlying the left hilum. IMPRESSION: No acute cardiopulmonary process seen. Electronically Signed: By: Garald Balding M.D. On: 09/12/2018 05:56   Dg Abdomen Acute W/chest  Result Date: 09/12/2018 CLINICAL DATA:  Nausea and vomiting EXAM: DG ABDOMEN ACUTE W/ 1V CHEST COMPARISON:  08/28/2018 FINDINGS: Cardiac shadow is stable. Loop recorder is noted. Stable scarring is noted in the left base. No focal infiltrate is seen. Multiple dilated loops of small bowel are noted with differential air-fluid levels consistent small-bowel obstruction. Some fecal impaction in the rectum is noted. No free air is seen. IMPRESSION: Changes consistent with small bowel obstruction. CT is recommended for further evaluation. Electronically Signed   By: Inez Catalina M.D.   On: 09/12/2018 02:44    Anti-infectives: Anti-infectives (From admission, onward)   None      Assessment/Plan: Imp:  Partial small bowel obstruction.  NGT repositioned.  No abdominal pain. Plan:  Continue bowel decompression for now.  LOS: 1 day    Aviva Signs 09/13/2018

## 2018-09-14 LAB — CBC
HCT: 43.3 % (ref 39.0–52.0)
Hemoglobin: 13.9 g/dL (ref 13.0–17.0)
MCH: 28 pg (ref 26.0–34.0)
MCHC: 32.1 g/dL (ref 30.0–36.0)
MCV: 87.1 fL (ref 80.0–100.0)
Platelets: 221 10*3/uL (ref 150–400)
RBC: 4.97 MIL/uL (ref 4.22–5.81)
RDW: 14.7 % (ref 11.5–15.5)
WBC: 2.9 10*3/uL — ABNORMAL LOW (ref 4.0–10.5)
nRBC: 0 % (ref 0.0–0.2)

## 2018-09-14 LAB — BASIC METABOLIC PANEL
Anion gap: 10 (ref 5–15)
BUN: 38 mg/dL — ABNORMAL HIGH (ref 8–23)
CO2: 28 mmol/L (ref 22–32)
Calcium: 8.4 mg/dL — ABNORMAL LOW (ref 8.9–10.3)
Chloride: 99 mmol/L (ref 98–111)
Creatinine, Ser: 0.9 mg/dL (ref 0.61–1.24)
GFR calc Af Amer: >60 mL/min (ref >60)
Glucose, Bld: 120 mg/dL — ABNORMAL HIGH (ref 70–99)
Potassium: 3 mmol/L — ABNORMAL LOW (ref 3.5–5.1)
Sodium: 137 mmol/L (ref 135–145)

## 2018-09-14 LAB — PHOSPHORUS: Phosphorus: 1.7 mg/dL — ABNORMAL LOW (ref 2.5–4.6)

## 2018-09-14 LAB — GLUCOSE, CAPILLARY: Glucose-Capillary: 134 mg/dL — ABNORMAL HIGH (ref 70–99)

## 2018-09-14 LAB — MAGNESIUM: Magnesium: 2.2 mg/dL (ref 1.7–2.4)

## 2018-09-14 MED ORDER — POTASSIUM PHOSPHATES 15 MMOLE/5ML IV SOLN
20.0000 mmol | Freq: Once | INTRAVENOUS | Status: AC
  Administered 2018-09-14: 20 mmol via INTRAVENOUS
  Filled 2018-09-14: qty 6.67

## 2018-09-14 MED ORDER — POTASSIUM CHLORIDE 10 MEQ/100ML IV SOLN
10.0000 meq | INTRAVENOUS | Status: AC
  Administered 2018-09-14 (×3): 10 meq via INTRAVENOUS
  Filled 2018-09-14 (×3): qty 100

## 2018-09-14 NOTE — Progress Notes (Signed)
Pharmacy consulted for phosphate replacement protocol:   Goal of therapy: Electrolytes within normal limits:  K 3.5 - 5.1 Corrected Ca 8.9 - 10.3 Phos 2.5 - 4.6 Mg 1.7 - 2.4   Assessment: Lab Results  Component Value Date   CREATININE 0.90 09/14/2018   BUN 38 (H) 09/14/2018   NA 137 09/14/2018   K 3.0 (L) 09/14/2018   CL 99 09/14/2018   CO2 28 09/14/2018  Phos 1.7  Plan: K Phosphate 11mmol iv x 1 will recheck phos with AM labs    Donna Christen Lakyn Mantione, PharmD, MBA, BCGP Clinical Pharmacist

## 2018-09-14 NOTE — Progress Notes (Signed)
  Subjective: No new complaints.  Patient desires to go home.  Objective: Vital signs in last 24 hours: Temp:  [97.7 F (36.5 C)-100.4 F (38 C)] 97.7 F (36.5 C) (12/29 0558) Pulse Rate:  [85-123] 105 (12/29 0558) Resp:  [18] 18 (12/29 0558) BP: (57-133)/(29-86) 105/77 (12/29 0558) SpO2:  [92 %-95 %] 93 % (12/29 0558) Last BM Date: 09/13/18  Intake/Output from previous day: 12/28 0701 - 12/29 0700 In: 1945.8 [I.V.:1645.8; IV Piggyback:300] Out: 1201 [Urine:600; Emesis/NG output:600; Stool:1] Intake/Output this shift: No intake/output data recorded.  General appearance: cooperative, no distress and Somewhat confused GI: Soft, minimal bowel sounds appreciated.  Nontender.  Slightly more distended than yesterday.  No rigidity noted.  Lab Results:  Recent Labs    09/13/18 0604 09/14/18 0510  WBC 5.1 2.9*  HGB 13.8 13.9  HCT 43.0 43.3  PLT 221 221   BMET Recent Labs    09/13/18 0604 09/14/18 0510  NA 139 137  K 3.7 3.0*  CL 96* 99  CO2 32 28  GLUCOSE 108* 120*  BUN 32* 38*  CREATININE 0.82 0.90  CALCIUM 8.8* 8.4*   PT/INR No results for input(s): LABPROT, INR in the last 72 hours.  Studies/Results: Dg Chest Port 1 View  Result Date: 09/13/2018 CLINICAL DATA:  Evaluate NG tube placement. EXAM: PORTABLE CHEST 1 VIEW COMPARISON:  Chest radiograph 09/12/2018 FINDINGS: Enteric tube tip projects over the upper abdomen, side-port projects at the GE junction. Stable cardiac and mediastinal contours. Lungs are clear. No pleural effusion or pneumothorax. Thoracic spine degenerative changes. IMPRESSION: Slight interval advancement of the enteric tube. Side-port at the GE junction, consider advancement. Electronically Signed   By: Lovey Newcomer M.D.   On: 09/13/2018 08:16   Dg Chest Port 1 View  Result Date: 09/12/2018 CLINICAL DATA:  Nasogastric tube placement. EXAM: PORTABLE CHEST 1 VIEW COMPARISON:  Radiograph of same day. FINDINGS: The heart size and mediastinal  contours are within normal limits. Both lungs are clear. Nasogastric tube tip is seen in expected position of proximal stomach. The visualized skeletal structures are unremarkable. IMPRESSION: Nasogastric tube tip seen in expected position of proximal stomach. No acute cardiopulmonary abnormality seen. Electronically Signed   By: Marijo Conception, M.D.   On: 09/12/2018 18:50    Anti-infectives: Anti-infectives (From admission, onward)   None      Assessment/Plan: Impression: Small bowel obstruction.  No significant resolution at the present time. Plan: Continue NG tube decompression.  May do small bowel follow-through tomorrow via the NG tube.  Hypophosphatemia will be addressed.  LOS: 2 days    Aviva Signs 09/14/2018

## 2018-09-14 NOTE — Progress Notes (Signed)
PROGRESS NOTE    Joshua Mckee  KPT:[id number] DOB: 04-13-1941 DOA: 09/12/2018 PCP: Dettinger, Fransisca Kaufmann, MD    Brief Narrative:  77 year old male with a history of seizure disorder, prostate cancer, bowel obstruction in the past, admitted to the hospital with nausea, vomiting, abdominal distention.  Found to have bowel obstruction on imaging.  NG tube placed for decompression.  Currently on bowel rest.   Assessment & Plan:   Principal Problem:   SBO (small bowel obstruction) (HCC) Active Problems:   Seizure (HCC)   Prostate cancer ([place])   History of CVA (cerebrovascular accident)   High transaminase levels   1. Small bowel obstruction.  He has had one bowel movement today.  Abdomen remains distended.  Continue NG tube for now.  General surgery following.  Continue n.p.o. 2. History of CVA.  Resume aspirin and statin once able. 3. Prostate cancer.  Follows with urology.  Resume Flomax when able. 4. Seizure disorder.  Continue on Keppra. 5. Hypokalemia.  Replace, check magnesium   DVT prophylaxis: Lovenox Code Status: Full code Family Communication: No family present Disposition Plan: Discharge home once bowel function has recovered   Consultants:   General surgery  Procedures:     Antimicrobials:       Subjective: NG tube remains in place.  He reports having a bowel movement earlier today.  He is coughing more.  Objective: Vitals:   09/13/18 2136 09/14/18 0556 09/14/18 0558 09/14/18 1318  BP: 120/86 (!) 57/29 105/77 107/88  Pulse: 85 (!) 103 (!) 105 (!) 108  Resp: 18 18 18    Temp: 99 F (37.2 C) (!) 100.4 F (38 C) 97.7 F (36.5 C)   TempSrc: Oral Axillary Oral   SpO2: 93% 95% 93% 95%  Weight:      Height:        Intake/Output Summary (Last 24 hours) at 09/14/2018 1650 Last data filed at 09/14/2018 1052 Gross per 24 hour  Intake 1268.25 ml  Output 1701 ml  Net -432.75 ml   Filed Weights   09/12/18 0143 09/12/18 1759  Weight: 80 kg 73.9 kg     Examination:  General exam: Alert, awake, oriented x 3, NG tube in place Respiratory system: Coarse breath sounds at bases. Respiratory effort normal. Cardiovascular system:RRR. No murmurs, rubs, gallops. Gastrointestinal system: Abdomen is distended, soft and nontender. No organomegaly or masses felt. Normal bowel sounds heard. Central nervous system: Alert and oriented. No focal neurological deficits. Extremities: No C/C/E, +pedal pulses Skin: No rashes, lesions or ulcers Psychiatry: Judgement and insight appear normal. Mood & affect appropriate.   Data Reviewed: I have personally reviewed following labs and imaging studies  CBC: Recent Labs  Lab 09/12/18 0210 09/13/18 0604 09/14/18 0510  WBC 7.6 5.1 2.9*  NEUTROABS 6.4  --   --   HGB 13.6 13.8 13.9  HCT 42.7 43.0 43.3  MCV 87.7 87.9 87.1  PLT 202 221 170   Basic Metabolic Panel: Recent Labs  Lab 09/12/18 0210 09/13/18 0604 09/14/18 0510  NA 137 139 137  K 3.8 3.7 3.0*  CL 101 96* 99  CO2 26 32 28  GLUCOSE 145* 108* 120*  BUN 20 32* 38*  CREATININE 0.85 0.82 0.90  CALCIUM 9.1 8.8* 8.4*  MG  --   --  2.2  PHOS  --   --  1.7*   GFR: Estimated Creatinine Clearance: 62.6 mL/min (by C-G formula based on SCr of 0.9 mg/dL). Liver Function Tests: Recent Labs  Lab 09/12/18 0210 09/13/18  0604  AST 84* 62*  ALT 77* 66*  ALKPHOS 80 70  BILITOT 0.8 1.3*  PROT 7.8 7.6  ALBUMIN 4.0 3.9   Recent Labs  Lab 09/12/18 0210  LIPASE 29   No results for input(s): AMMONIA in the last 168 hours. Coagulation Profile: No results for input(s): INR, PROTIME in the last 168 hours. Cardiac Enzymes: No results for input(s): CKTOTAL, CKMB, CKMBINDEX, TROPONINI in the last 168 hours. BNP (last 3 results) No results for input(s): PROBNP in the last 8760 hours. HbA1C: No results for input(s): HGBA1C in the last 72 hours. CBG: Recent Labs  Lab 09/12/18 0836 09/13/18 2201 09/14/18 1023  GLUCAP 125* 111* 134*   Lipid  Profile: No results for input(s): CHOL, HDL, LDLCALC, TRIG, CHOLHDL, LDLDIRECT in the last 72 hours. Thyroid Function Tests: No results for input(s): TSH, T4TOTAL, FREET4, T3FREE, THYROIDAB in the last 72 hours. Anemia Panel: No results for input(s): VITAMINB12, FOLATE, FERRITIN, TIBC, IRON, RETICCTPCT in the last 72 hours. Sepsis Labs: No results for input(s): PROCALCITON, LATICACIDVEN in the last 168 hours.  No results found for this or any previous visit (from the past 240 hour(s)).       Radiology Studies: Dg Chest Port 1 View  Result Date: 09/13/2018 CLINICAL DATA:  Evaluate NG tube placement. EXAM: PORTABLE CHEST 1 VIEW COMPARISON:  Chest radiograph 09/12/2018 FINDINGS: Enteric tube tip projects over the upper abdomen, side-port projects at the GE junction. Stable cardiac and mediastinal contours. Lungs are clear. No pleural effusion or pneumothorax. Thoracic spine degenerative changes. IMPRESSION: Slight interval advancement of the enteric tube. Side-port at the GE junction, consider advancement. Electronically Signed   By: Lovey Newcomer M.D.   On: 09/13/2018 08:16   Dg Chest Port 1 View  Result Date: 09/12/2018 CLINICAL DATA:  Nasogastric tube placement. EXAM: PORTABLE CHEST 1 VIEW COMPARISON:  Radiograph of same day. FINDINGS: The heart size and mediastinal contours are within normal limits. Both lungs are clear. Nasogastric tube tip is seen in expected position of proximal stomach. The visualized skeletal structures are unremarkable. IMPRESSION: Nasogastric tube tip seen in expected position of proximal stomach. No acute cardiopulmonary abnormality seen. Electronically Signed   By: Marijo Conception, M.D.   On: 09/12/2018 18:50        Scheduled Meds: . bisacodyl  10 mg Rectal Daily  . enoxaparin (LOVENOX) injection  40 mg Subcutaneous Q24H   Continuous Infusions: . dextrose 5 % and 0.45 % NaCl with KCl 20 mEq/L 100 mL/hr at 09/14/18 0911  . levETIRAcetam 500 mg (09/14/18  1047)  . potassium PHOSPHATE IVPB (in mmol) 20 mmol (09/14/18 1551)     LOS: 2 days    Time spent: 43mins    Kathie Dike, MD [facility] Pager [phone]  If 7PM-7AM, please contact night-coverage [email or web address] Password [facility] 09/14/2018, 4:50 PM

## 2018-09-15 ENCOUNTER — Encounter (HOSPITAL_COMMUNITY): Payer: Self-pay | Admitting: Internal Medicine

## 2018-09-15 ENCOUNTER — Inpatient Hospital Stay (HOSPITAL_COMMUNITY): Payer: Medicare Other

## 2018-09-15 DIAGNOSIS — N179 Acute kidney failure, unspecified: Secondary | ICD-10-CM

## 2018-09-15 HISTORY — DX: Acute kidney failure, unspecified: N17.9

## 2018-09-15 LAB — GLUCOSE, CAPILLARY: GLUCOSE-CAPILLARY: 123 mg/dL — AB (ref 70–99)

## 2018-09-15 LAB — CBC
HCT: 41.4 % (ref 39.0–52.0)
Hemoglobin: 13.4 g/dL (ref 13.0–17.0)
MCH: 27.9 pg (ref 26.0–34.0)
MCHC: 32.4 g/dL (ref 30.0–36.0)
MCV: 86.3 fL (ref 80.0–100.0)
NRBC: 0 % (ref 0.0–0.2)
Platelets: 223 10*3/uL (ref 150–400)
RBC: 4.8 MIL/uL (ref 4.22–5.81)
RDW: 14.9 % (ref 11.5–15.5)
WBC: 8.8 10*3/uL (ref 4.0–10.5)

## 2018-09-15 LAB — BASIC METABOLIC PANEL
ANION GAP: 10 (ref 5–15)
BUN: 54 mg/dL — ABNORMAL HIGH (ref 8–23)
CO2: 29 mmol/L (ref 22–32)
Calcium: 8.2 mg/dL — ABNORMAL LOW (ref 8.9–10.3)
Chloride: 94 mmol/L — ABNORMAL LOW (ref 98–111)
Creatinine, Ser: 1.35 mg/dL — ABNORMAL HIGH (ref 0.61–1.24)
GFR calc Af Amer: 57 mL/min — ABNORMAL LOW (ref >60)
GFR calc non Af Amer: 50 mL/min — ABNORMAL LOW (ref >60)
Glucose, Bld: 131 mg/dL — ABNORMAL HIGH (ref 70–99)
Potassium: 3.7 mmol/L (ref 3.5–5.1)
SODIUM: 133 mmol/L — AB (ref 135–145)

## 2018-09-15 LAB — PHOSPHORUS: Phosphorus: 2.8 mg/dL (ref 2.5–4.6)

## 2018-09-15 LAB — MAGNESIUM: Magnesium: 2.2 mg/dL (ref 1.7–2.4)

## 2018-09-15 MED ORDER — IOPAMIDOL (ISOVUE-300) INJECTION 61%
100.0000 mL | Freq: Once | INTRAVENOUS | Status: AC | PRN
  Administered 2018-09-15: 100 mL via INTRAVENOUS

## 2018-09-15 MED ORDER — CHLORHEXIDINE GLUCONATE CLOTH 2 % EX PADS
6.0000 | MEDICATED_PAD | Freq: Once | CUTANEOUS | Status: AC
  Administered 2018-09-16: 6 via TOPICAL

## 2018-09-15 MED ORDER — IOPAMIDOL (ISOVUE-300) INJECTION 61%
30.0000 mL | Freq: Once | INTRAVENOUS | Status: AC | PRN
  Administered 2018-09-15: 30 mL via ORAL

## 2018-09-15 MED ORDER — CHLORHEXIDINE GLUCONATE CLOTH 2 % EX PADS
6.0000 | MEDICATED_PAD | Freq: Once | CUTANEOUS | Status: AC
  Administered 2018-09-15: 6 via TOPICAL

## 2018-09-15 MED ORDER — CEFAZOLIN SODIUM-DEXTROSE 2-4 GM/100ML-% IV SOLN
2.0000 g | INTRAVENOUS | Status: AC
  Administered 2018-09-16: 2 g via INTRAVENOUS
  Filled 2018-09-15 (×2): qty 100

## 2018-09-15 NOTE — H&P (View-Only) (Signed)
Repeat CT scan of the abdomen reveals ongoing distal small bowel obstruction with collapsed terminal ileum and colon.  We will proceed with exploratory laparotomy tomorrow.  The risks and benefits of the procedure including bleeding, infection, cardiopulmonary difficulties, and the possibility of a bowel resection were fully explained to the patient, who gave informed consent.

## 2018-09-15 NOTE — Progress Notes (Signed)
First bottle of contrast given through NG tube. Pt tolerated poorly as he is spitting up and vomiting a lot of bile. Joshua Mckee made aware. Pt now sitting up in the chair and will give second bottle of contrast at 1000.

## 2018-09-15 NOTE — Progress Notes (Signed)
Repeat CT scan of the abdomen reveals ongoing distal small bowel obstruction with collapsed terminal ileum and colon.  We will proceed with exploratory laparotomy tomorrow.  The risks and benefits of the procedure including bleeding, infection, cardiopulmonary difficulties, and the possibility of a bowel resection were fully explained to the patient, who gave informed consent.

## 2018-09-15 NOTE — Progress Notes (Signed)
Consent signed and placed in the front of the chart.

## 2018-09-15 NOTE — Progress Notes (Signed)
  Subjective: Appears more alert this morning.  Objective: Vital signs in last 24 hours: Temp:  [98.4 F (36.9 C)-99.8 F (37.7 C)] 99.8 F (37.7 C) (12/30 0514) Pulse Rate:  [108-110] 110 (12/30 0514) Resp:  [18] 18 (12/30 0514) BP: (107-117)/(86-89) 116/86 (12/30 0514) SpO2:  [90 %-95 %] 92 % (12/30 0514) Last BM Date: 09/14/18  Intake/Output from previous day: 12/29 0701 - 12/30 0700 In: 2464.9 [I.V.:1510.1; IV Piggyback:954.7] Out: 700 [Emesis/NG output:700] Intake/Output this shift: No intake/output data recorded.  General appearance: alert, cooperative and no distress GI: soft, non-tender; bowel sounds normal; no masses,  no organomegaly  Lab Results:  Recent Labs    09/14/18 0510 09/15/18 0539  WBC 2.9* 8.8  HGB 13.9 13.4  HCT 43.3 41.4  PLT 221 223   BMET Recent Labs    09/14/18 0510 09/15/18 0539  NA 137 133*  K 3.0* 3.7  CL 99 94*  CO2 28 29  GLUCOSE 120* 131*  BUN 38* 54*  CREATININE 0.90 1.35*  CALCIUM 8.4* 8.2*   PT/INR No results for input(s): LABPROT, INR in the last 72 hours.  Studies/Results: No results found.  Anti-infectives: Anti-infectives (From admission, onward)   None      Assessment/Plan: Impression: Small bowel obstruction.  Patient continues to have both bowel movements, emesis, and NG tube output.  Will get follow-up CT scan of abdomen with contrast to assess the bowel.  Further management is pending those results.  LOS: 3 days    Aviva Signs 09/15/2018

## 2018-09-15 NOTE — Progress Notes (Signed)
PROGRESS NOTE    Dragan Tamburrino  WLN:[id number] DOB: June 11, 1941 DOA: 09/12/2018 PCP: Dettinger, Fransisca Kaufmann, MD    Brief Narrative:  77 year old male with a history of seizure disorder, prostate cancer, bowel obstruction in the past, admitted to the hospital with nausea, vomiting, abdominal distention.  Found to have bowel obstruction on imaging.  NG tube placed for decompression.  General surgery following.  Due to his lack of improvement, plans are for operative management on 12/31.   Assessment & Plan:   Principal Problem:   SBO (small bowel obstruction) (HCC) Active Problems:   Seizure ([place])   Prostate cancer ([employer])   History of CVA (cerebrovascular accident)   High transaminase levels   1. Small bowel obstruction.  Repeat CT abdomen today shows persistent obstruction. Discussed with Dr. Arnoldo Morale and plans are for operative management tomorrow . 2. History of CVA.  Resume aspirin and statin once able. 3. Prostate cancer.  Follows with urology.  Resume Flomax when able. 4. Seizure disorder.  Continue on Keppra. 5. Hypokalemia.  Replaced.  Magnesium normal. 6. Acute kidney injury.  Creatinine has trended up since admission.  Possibly related to contrast-induced nephropathy.  Continue on IV hydration.  Continue to monitor renal function and urine output.   DVT prophylaxis: Lovenox Code Status: Full code Family Communication: No family present Disposition Plan: Discharge home once bowel function has recovered   Consultants:   General surgery  Procedures:     Antimicrobials:       Subjective: He is anxious about surgery tomorrow. Still having nausea and reports some vomiting.  Objective: Vitals:   09/14/18 2147 09/15/18 0514 09/15/18 1345 09/15/18 1346  BP: 117/89 116/86 112/87   Pulse: (!) 108 (!) 110 99   Resp: 18 18 18    Temp: 98.4 F (36.9 C) 99.8 F (37.7 C) 97.9 F (36.6 C)   TempSrc: Oral Oral    SpO2: 90% 92% (!) 86% 90%  Weight:      Height:         Intake/Output Summary (Last 24 hours) at 09/15/2018 1539 Last data filed at 09/15/2018 1300 Gross per 24 hour  Intake 2464.86 ml  Output 150 ml  Net 2314.86 ml   Filed Weights   09/12/18 0143 09/12/18 1759  Weight: 80 kg 73.9 kg    Examination:  General exam: Alert, awake, no distress, NG tube in place Respiratory system: coarse breath sounds at bases. Respiratory effort normal. Cardiovascular system:RRR. No murmurs, rubs, gallops. Gastrointestinal system: Abdomen is distended, soft and nontender. No organomegaly or masses felt. Normal bowel sounds heard. Central nervous system: Alert and oriented. No focal neurological deficits. Extremities: No C/C/E, +pedal pulses Skin: No rashes, lesions or ulcers Psychiatry: Judgement and insight appear normal. Mood & affect appropriate.    Data Reviewed: I have personally reviewed following labs and imaging studies  CBC: Recent Labs  Lab 09/12/18 0210 09/13/18 0604 09/14/18 0510 09/15/18 0539  WBC 7.6 5.1 2.9* 8.8  NEUTROABS 6.4  --   --   --   HGB 13.6 13.8 13.9 13.4  HCT 42.7 43.0 43.3 41.4  MCV 87.7 87.9 87.1 86.3  PLT 202 221 221 740   Basic Metabolic Panel: Recent Labs  Lab 09/12/18 0210 09/13/18 0604 09/14/18 0510 09/15/18 0539  NA 137 139 137 133*  K 3.8 3.7 3.0* 3.7  CL 101 96* 99 94*  CO2 26 32 28 29  GLUCOSE 145* 108* 120* 131*  BUN 20 32* 38* 54*  CREATININE 0.85 0.82 0.90  1.35*  CALCIUM 9.1 8.8* 8.4* 8.2*  MG  --   --  2.2 2.2  PHOS  --   --  1.7* 2.8   GFR: Estimated Creatinine Clearance: 41.7 mL/min (A) (by C-G formula based on SCr of 1.35 mg/dL (H)). Liver Function Tests: Recent Labs  Lab 09/12/18 0210 09/13/18 0604  AST 84* 62*  ALT 77* 66*  ALKPHOS 80 70  BILITOT 0.8 1.3*  PROT 7.8 7.6  ALBUMIN 4.0 3.9   Recent Labs  Lab 09/12/18 0210  LIPASE 29   No results for input(s): AMMONIA in the last 168 hours. Coagulation Profile: No results for input(s): INR, PROTIME in the last 168  hours. Cardiac Enzymes: No results for input(s): CKTOTAL, CKMB, CKMBINDEX, TROPONINI in the last 168 hours. BNP (last 3 results) No results for input(s): PROBNP in the last 8760 hours. HbA1C: No results for input(s): HGBA1C in the last 72 hours. CBG: Recent Labs  Lab 09/12/18 0836 09/13/18 2201 09/14/18 1023 09/15/18 0745  GLUCAP 125* 111* 134* 123*   Lipid Profile: No results for input(s): CHOL, HDL, LDLCALC, TRIG, CHOLHDL, LDLDIRECT in the last 72 hours. Thyroid Function Tests: No results for input(s): TSH, T4TOTAL, FREET4, T3FREE, THYROIDAB in the last 72 hours. Anemia Panel: No results for input(s): VITAMINB12, FOLATE, FERRITIN, TIBC, IRON, RETICCTPCT in the last 72 hours. Sepsis Labs: No results for input(s): PROCALCITON, LATICACIDVEN in the last 168 hours.  No results found for this or any previous visit (from the past 240 hour(s)).       Radiology Studies: Ct Abdomen Pelvis W Contrast  Result Date: 09/15/2018 CLINICAL DATA:  77 year old male with a history of bowel obstruction. EXAM: CT ABDOMEN AND PELVIS WITH CONTRAST TECHNIQUE: Multidetector CT imaging of the abdomen and pelvis was performed using the standard protocol following bolus administration of intravenous contrast. CONTRAST:  76mL ISOVUE-300 IOPAMIDOL (ISOVUE-300) INJECTION 61%, 152mL ISOVUE-300 IOPAMIDOL (ISOVUE-300) INJECTION 61% COMPARISON:  CT 09/12/2018 FINDINGS: Lower chest: Since the prior CT there has been development of new nodular and consolidative airspace disease at the lung bases. Hepatobiliary: Similar appearance of the liver with multiple low-density cystic lesions of left and right liver. None of these demonstrate enhancement on the delay. Unremarkable gallbladder Pancreas: Unremarkable pancreas Spleen: Unremarkable spleen Adrenals/Urinary Tract: Unremarkable adrenal glands. Kidneys demonstrate no hydronephrosis or nephrolithiasis. Unremarkable course the bilateral ureters. Bosniak 1 cyst of the  left kidney again demonstrated. Stomach/Bowel: Hiatal hernia. Gastric tube terminates within the stomach. Redemonstration of dilation of majority of small bowel with a discrete transition point in the right lower quadrant, unchanged from the prior CT (image 45 series 2). Beyond this small bowel is completely decompressed. Decompressed colon with small volume of stool. Diverticula are present without inflammation. Vascular/Lymphatic: Mild atherosclerotic changes. No aortic aneurysm. Mesenteric arteries and bilateral renal arteries are patent. Bilateral iliac arteries and proximal femoral arteries are patent. 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 Reproductive: Unremarkable pelvic structures Other: Fat containing bilateral inguinal hernia. Musculoskeletal: Degenerative changes of the spine. No displaced fracture identified. IMPRESSION: Unchanged appearance of small bowel obstruction, with unchanged appearance of the previously identified transition point in the right lower quadrant. Volume loss and/or consolidation at the bilateral lung bases. Infection is/aspiration pneumonitis not favored, though can not be excluded. Gastric tube terminates within the stomach. Electronically Signed   By: Corrie Mckusick D.O.   On: 09/15/2018 13:06        Scheduled Meds: . bisacodyl  10 mg  Rectal Daily  . Chlorhexidine Gluconate Cloth  6 each Topical Once   And  . Chlorhexidine Gluconate Cloth  6 each Topical Once  . enoxaparin (LOVENOX) injection  40 mg Subcutaneous Q24H   Continuous Infusions: . [START ON 09/16/2018]  ceFAZolin (ANCEF) IV    . dextrose 5 % and 0.45 % NaCl with KCl 20 mEq/L 100 mL/hr at 09/15/18 0437  . levETIRAcetam 500 mg (09/15/18 1035)     LOS: 3 days    Time spent: 63mins    Kathie Dike, MD [facility] Pager [phone]  If 7PM-7AM, please contact night-coverage [email or web address] Password [facility] 09/15/2018, 3:39  PM

## 2018-09-15 NOTE — Care Management Important Message (Signed)
Important Message  Patient Details  Name: Joshua Mckee MRN: [id number] Date of Birth: 10/30/1940   Medicare Important Message Given:  Yes    Shelda Altes 09/15/2018, 12:14 PM

## 2018-09-15 NOTE — Progress Notes (Signed)
Pharmacy consulted for phosphate replacement protocol:   Goal of therapy: Electrolytes within normal limits:  K 3.5 - 5.1 Corrected Ca 8.9 - 10.3 Phos 2.5 - 4.6 Mg 1.7 - 2.4   Assessment: Lab Results  Component Value Date   CREATININE 1.35 (H) 09/15/2018   BUN 54 (H) 09/15/2018   NA 133 (L) 09/15/2018   K 3.7 09/15/2018   CL 94 (L) 09/15/2018   CO2 29 09/15/2018  Phos 1.7>>2.8  Plan: Phosphate replaced, will sign off Please reconsult if needed  Isac Sarna, BS Pharm D, BCPS Clinical Pharmacist Pager [phone] 09/15/18 1000

## 2018-09-16 ENCOUNTER — Encounter (HOSPITAL_COMMUNITY): Payer: Self-pay | Admitting: Anesthesiology

## 2018-09-16 ENCOUNTER — Encounter (HOSPITAL_COMMUNITY)
Admission: EM | Disposition: A | Discharge status: Home Health | Payer: Self-pay | Source: Home / Self Care | Attending: General Surgery

## 2018-09-16 ENCOUNTER — Inpatient Hospital Stay (HOSPITAL_COMMUNITY): Payer: Medicare Other | Admitting: Anesthesiology

## 2018-09-16 DIAGNOSIS — K565 Intestinal adhesions [bands], unspecified as to partial versus complete obstruction: Secondary | ICD-10-CM

## 2018-09-16 HISTORY — PX: BOWEL RESECTION: SHX1257

## 2018-09-16 HISTORY — PX: LAPAROTOMY: SHX154

## 2018-09-16 HISTORY — PX: LYSIS OF ADHESION: SHX5961

## 2018-09-16 LAB — SURGICAL PCR SCREEN
MRSA, PCR: NEGATIVE
Staphylococcus aureus: NEGATIVE

## 2018-09-16 LAB — GLUCOSE, CAPILLARY
Glucose-Capillary: 119 mg/dL — ABNORMAL HIGH (ref 70–99)
Glucose-Capillary: 107 mg/dL — ABNORMAL HIGH (ref 70–99)

## 2018-09-16 SURGERY — LAPAROTOMY, EXPLORATORY
Anesthesia: General | Site: Abdomen

## 2018-09-16 MED ORDER — PANTOPRAZOLE SODIUM 40 MG PO TBEC
40.0000 mg | DELAYED_RELEASE_TABLET | Freq: Every day | ORAL | Status: DC
  Administered 2018-09-16 – 2018-09-21 (×5): 40 mg via ORAL
  Filled 2018-09-16 (×5): qty 1

## 2018-09-16 MED ORDER — ONDANSETRON HCL 4 MG/2ML IJ SOLN
4.0000 mg | Freq: Once | INTRAMUSCULAR | Status: AC | PRN
  Administered 2018-09-16: 4 mg via INTRAVENOUS
  Filled 2018-09-16: qty 2

## 2018-09-16 MED ORDER — MEPERIDINE HCL 50 MG/ML IJ SOLN: 6.2500 mg | INTRAMUSCULAR | Status: DC | PRN

## 2018-09-16 MED ORDER — ENOXAPARIN SODIUM 40 MG/0.4ML ~~LOC~~ SOLN: 40.0000 mg | SUBCUTANEOUS | Status: DC

## 2018-09-16 MED ORDER — BUPIVACAINE LIPOSOME 1.3 % IJ SUSP
INTRAMUSCULAR | Status: AC
  Filled 2018-09-16: qty 20

## 2018-09-16 MED ORDER — LORAZEPAM 2 MG/ML IJ SOLN: 0.5000 mg | INTRAMUSCULAR | Status: DC | PRN

## 2018-09-16 MED ORDER — TRAMADOL HCL 50 MG PO TABS: 50.0000 mg | ORAL_TABLET | Freq: Four times a day (QID) | ORAL | Status: DC | PRN

## 2018-09-16 MED ORDER — BUPIVACAINE LIPOSOME 1.3 % IJ SUSP
INTRAMUSCULAR | Status: DC | PRN
  Administered 2018-09-16: 20 mL

## 2018-09-16 MED ORDER — MORPHINE SULFATE (PF) 2 MG/ML IV SOLN: 2.0000 mg | INTRAVENOUS | Status: DC | PRN

## 2018-09-16 MED ORDER — POVIDONE-IODINE 10 % OINT PACKET
TOPICAL_OINTMENT | CUTANEOUS | Status: DC | PRN
  Administered 2018-09-16: 1 via TOPICAL

## 2018-09-16 MED ORDER — SODIUM CHLORIDE 0.9 % IV SOLN
INTRAVENOUS | Status: DC
  Administered 2018-09-16 – 2018-09-18 (×4): via INTRAVENOUS

## 2018-09-16 MED ORDER — FENTANYL CITRATE (PF) 250 MCG/5ML IJ SOLN
INTRAMUSCULAR | Status: AC
  Filled 2018-09-16: qty 5

## 2018-09-16 MED ORDER — PROPOFOL 10 MG/ML IV BOLUS
INTRAVENOUS | Status: DC | PRN
  Administered 2018-09-16: 180 mg via INTRAVENOUS

## 2018-09-16 MED ORDER — ONDANSETRON HCL 4 MG/2ML IJ SOLN
4.0000 mg | Freq: Four times a day (QID) | INTRAMUSCULAR | Status: DC | PRN
  Administered 2018-09-16: 4 mg via INTRAVENOUS
  Filled 2018-09-16: qty 2

## 2018-09-16 MED ORDER — SIMETHICONE 80 MG PO CHEW: 40.0000 mg | CHEWABLE_TABLET | Freq: Four times a day (QID) | ORAL | Status: DC | PRN

## 2018-09-16 MED ORDER — PROPOFOL 10 MG/ML IV BOLUS
INTRAVENOUS | Status: AC
  Filled 2018-09-16: qty 20

## 2018-09-16 MED ORDER — SUGAMMADEX SODIUM 200 MG/2ML IV SOLN
INTRAVENOUS | Status: DC | PRN
  Administered 2018-09-16: 148 mg via INTRAVENOUS

## 2018-09-16 MED ORDER — KETOROLAC TROMETHAMINE 30 MG/ML IJ SOLN
30.0000 mg | Freq: Once | INTRAMUSCULAR | Status: AC | PRN
  Administered 2018-09-16: 30 mg via INTRAVENOUS
  Filled 2018-09-16: qty 1

## 2018-09-16 MED ORDER — SUCCINYLCHOLINE CHLORIDE 20 MG/ML IJ SOLN
INTRAMUSCULAR | Status: DC | PRN
  Administered 2018-09-16: 160 mg via INTRAVENOUS

## 2018-09-16 MED ORDER — KETOROLAC TROMETHAMINE 15 MG/ML IJ SOLN
15.0000 mg | Freq: Four times a day (QID) | INTRAMUSCULAR | Status: AC
  Filled 2018-09-16: qty 1

## 2018-09-16 MED ORDER — LACTATED RINGERS IV SOLN
INTRAVENOUS | Status: DC
  Administered 2018-09-16 (×3): via INTRAVENOUS

## 2018-09-16 MED ORDER — ROCURONIUM BROMIDE 50 MG/5ML IV SOSY
PREFILLED_SYRINGE | INTRAVENOUS | Status: DC | PRN
  Administered 2018-09-16: 30 mg via INTRAVENOUS

## 2018-09-16 MED ORDER — HYDROCHLOROTHIAZIDE 25 MG PO TABS
25.0000 mg | ORAL_TABLET | Freq: Every day | ORAL | Status: DC
  Administered 2018-09-16: 25 mg via ORAL
  Filled 2018-09-16: qty 1

## 2018-09-16 MED ORDER — HYDROMORPHONE HCL 1 MG/ML IJ SOLN
0.2500 mg | INTRAMUSCULAR | Status: DC | PRN
  Administered 2018-09-16 (×3): 0.5 mg via INTRAVENOUS
  Filled 2018-09-16 (×3): qty 0.5

## 2018-09-16 MED ORDER — FENTANYL CITRATE (PF) 100 MCG/2ML IJ SOLN
INTRAMUSCULAR | Status: DC | PRN
  Administered 2018-09-16: 50 ug via INTRAVENOUS
  Administered 2018-09-16: 100 ug via INTRAVENOUS
  Administered 2018-09-16: 50 ug via INTRAVENOUS

## 2018-09-16 MED ORDER — ACETAMINOPHEN 325 MG PO TABS: 650.0000 mg | ORAL_TABLET | Freq: Four times a day (QID) | ORAL | Status: DC | PRN

## 2018-09-16 MED ORDER — PHENYLEPHRINE HCL 10 MG/ML IJ SOLN
INTRAMUSCULAR | Status: DC | PRN
  Administered 2018-09-16 (×3): 200 ug via INTRAVENOUS
  Administered 2018-09-16: 80 ug via INTRAVENOUS

## 2018-09-16 MED ORDER — TAMSULOSIN HCL 0.4 MG PO CAPS
0.4000 mg | ORAL_CAPSULE | Freq: Every day | ORAL | Status: DC
  Administered 2018-09-16 – 2018-09-21 (×6): 0.4 mg via ORAL
  Filled 2018-09-16 (×6): qty 1

## 2018-09-16 MED ORDER — LEVETIRACETAM 500 MG PO TABS
500.0000 mg | ORAL_TABLET | Freq: Two times a day (BID) | ORAL | Status: DC
  Administered 2018-09-16 – 2018-09-21 (×11): 500 mg via ORAL
  Filled 2018-09-16 (×11): qty 1

## 2018-09-16 MED ORDER — POVIDONE-IODINE 10 % EX OINT
TOPICAL_OINTMENT | CUTANEOUS | Status: AC
  Filled 2018-09-16: qty 1

## 2018-09-16 MED ORDER — HYDROCODONE-ACETAMINOPHEN 7.5-325 MG PO TABS: 1.0000 | ORAL_TABLET | Freq: Once | ORAL | Status: DC | PRN

## 2018-09-16 MED ORDER — SODIUM CHLORIDE 0.9 % IR SOLN
Status: DC | PRN
  Administered 2018-09-16 (×2): 1000 mL

## 2018-09-16 MED ORDER — ACETAMINOPHEN 650 MG RE SUPP: 650.0000 mg | Freq: Four times a day (QID) | RECTAL | Status: DC | PRN

## 2018-09-16 MED ORDER — ANTITHROMBIN III (HUMAN) 500 UNITS IV SOLR: INTRAVENOUS | Status: DC | PRN

## 2018-09-16 MED ORDER — KETOROLAC TROMETHAMINE 15 MG/ML IJ SOLN: 15.0000 mg | Freq: Four times a day (QID) | INTRAMUSCULAR | Status: DC | PRN

## 2018-09-16 MED ORDER — ONDANSETRON HCL 4 MG/2ML IJ SOLN
INTRAMUSCULAR | Status: DC | PRN
  Administered 2018-09-16: 4 mg via INTRAVENOUS

## 2018-09-16 MED ORDER — ONDANSETRON 4 MG PO TBDP: 4.0000 mg | ORAL_TABLET | Freq: Four times a day (QID) | ORAL | Status: DC | PRN

## 2018-09-16 MED ORDER — ARTIFICIAL TEARS OPHTHALMIC OINT
TOPICAL_OINTMENT | OPHTHALMIC | Status: AC
  Filled 2018-09-16: qty 3.5

## 2018-09-16 SURGICAL SUPPLY — 42 items
BLADE 10 SAFETY STRL DISP (BLADE) ×2 IMPLANT
CHLORAPREP W/TINT 26ML (MISCELLANEOUS) ×3 IMPLANT
CLOTH BEACON ORANGE TIMEOUT ST (SAFETY) ×3 IMPLANT
COVER LIGHT HANDLE STERIS (MISCELLANEOUS) ×6 IMPLANT
COVER WAND RF STERILE (DRAPES) ×2 IMPLANT
DRAPE WARM FLUID 44X44 (DRAPE) ×3 IMPLANT
DRSG OPSITE POSTOP 4X10 (GAUZE/BANDAGES/DRESSINGS) ×3 IMPLANT
ELECT REM PT RETURN 9FT ADLT (ELECTROSURGICAL) ×3
ELECTRODE REM PT RTRN 9FT ADLT (ELECTROSURGICAL) ×1 IMPLANT
GAUZE 4X4 16PLY RFD (DISPOSABLE) ×2 IMPLANT
GLOVE BIOGEL PI IND STRL 7.0 (GLOVE) ×2 IMPLANT
GLOVE BIOGEL PI INDICATOR 7.0 (GLOVE) ×6
GLOVE ECLIPSE 6.5 STRL STRAW (GLOVE) ×8 IMPLANT
GLOVE SURG SS PI 7.5 STRL IVOR (GLOVE) ×5 IMPLANT
GOWN STRL REUS W/TWL LRG LVL3 (GOWN DISPOSABLE) ×9 IMPLANT
HANDLE SUCTION POOLE (INSTRUMENTS) IMPLANT
INST SET MAJOR GENERAL (KITS) ×3 IMPLANT
KIT TURNOVER KIT A (KITS) ×3 IMPLANT
LIGASURE IMPACT 36 18CM CVD LR (INSTRUMENTS) ×3 IMPLANT
MANIFOLD NEPTUNE II (INSTRUMENTS) ×3 IMPLANT
NDL HYPO 18GX1.5 BLUNT FILL (NEEDLE) ×1 IMPLANT
NDL HYPO 21X1.5 SAFETY (NEEDLE) IMPLANT
NEEDLE HYPO 18GX1.5 BLUNT FILL (NEEDLE) ×3 IMPLANT
NEEDLE HYPO 21X1.5 SAFETY (NEEDLE) ×3 IMPLANT
NS IRRIG 1000ML POUR BTL (IV SOLUTION) ×6 IMPLANT
PACK ABDOMINAL MAJOR (CUSTOM PROCEDURE TRAY) ×3 IMPLANT
PAD ARMBOARD 7.5X6 YLW CONV (MISCELLANEOUS) ×3 IMPLANT
PENCIL HANDSWITCHING (ELECTRODE) ×2 IMPLANT
RELOAD LINEAR CUT PROX 55 BLUE (ENDOMECHANICALS) ×6 IMPLANT
RELOAD STAPLE 55 3.8 BLU REG (ENDOMECHANICALS) IMPLANT
RETRACTOR WND ALEXIS-O 25 LRG (MISCELLANEOUS) IMPLANT
RTRCTR WOUND ALEXIS O 25CM LRG (MISCELLANEOUS) ×3
SET BASIN LINEN APH (SET/KITS/TRAYS/PACK) ×3 IMPLANT
SPONGE LAP 18X18 RF (DISPOSABLE) ×4 IMPLANT
STAPLER GUN LINEAR PROX 60 (STAPLE) ×2 IMPLANT
STAPLER PROXIMATE 55 BLUE (STAPLE) ×2 IMPLANT
STAPLER VISISTAT (STAPLE) ×3 IMPLANT
SUCTION POOLE HANDLE (INSTRUMENTS) ×3
SUT NOVA NAB GS-26 0 60 (SUTURE) ×4 IMPLANT
SUT SILK 3 0 SH CR/8 (SUTURE) ×2 IMPLANT
SYR 20CC LL (SYRINGE) ×3 IMPLANT
TRAY FOLEY MTR SLVR 16FR STAT (SET/KITS/TRAYS/PACK) ×3 IMPLANT

## 2018-09-16 NOTE — Anesthesia Procedure Notes (Signed)
Procedure Name: Intubation Date/Time: 09/16/2018 10:37 AM Performed by: Andree Elk, Amy A, CRNA Pre-anesthesia Checklist: Patient identified, Patient being monitored, Timeout performed, Emergency Drugs available and Suction available Patient Re-evaluated:Patient Re-evaluated prior to induction Oxygen Delivery Method: Circle system utilized Preoxygenation: Pre-oxygenation with 100% oxygen Induction Type: IV induction, Rapid sequence and Cricoid Pressure applied Laryngoscope Size: Mac and 4 Grade View: Grade II Tube type: Subglottic suction tube Tube size: 8.0 mm Number of attempts: 1 Airway Equipment and Method: Stylet Placement Confirmation: ETT inserted through vocal cords under direct vision,  positive ETCO2 and breath sounds checked- equal and bilateral Secured at: 22 cm Tube secured with: Tape Dental Injury: Teeth and Oropharynx as per pre-operative assessment

## 2018-09-16 NOTE — Progress Notes (Signed)
Patient arrived back to unit via bed. Alert and oriented x4, Vital signs WNL. Bowel sounds hypoactive, incision clean, dry and intact. Patient denies any pain or nausea at this time. Provided pillow and instructed patient to splint when needing to cough, sneeze, or vomit. Call bell within reach, bed in lowest position and bed alarm on.

## 2018-09-16 NOTE — Anesthesia Preprocedure Evaluation (Signed)
Anesthesia Evaluation  Patient identified by MRN, date of birth, ID band Patient awake    Reviewed: Allergy & Precautions, H&P , NPO status , Patient's Chart, lab work & pertinent test results  Airway Mallampati: II  TM Distance: >3 FB Neck ROM: full    Dental no notable dental hx.    Pulmonary neg pulmonary ROS,    Pulmonary exam normal breath sounds clear to auscultation       Cardiovascular Exercise Tolerance: Good hypertension, + dysrhythmias  Rhythm:regular Rate:Normal  Echo:    LV EF: 60% -   65%    Neuro/Psych Seizures -,  PSYCHIATRIC DISORDERS CVA    GI/Hepatic Neg liver ROS, hiatal hernia, PUD, GERD  ,  Endo/Other  negative endocrine ROS  Renal/GU Renal disease   Prostate cancer [facility])    Musculoskeletal   Abdominal   Peds  Hematology negative hematology ROS (+)   Anesthesia Other Findings   Reproductive/Obstetrics negative OB ROS                             Anesthesia Physical Anesthesia Plan  ASA: III  Anesthesia Plan: General   Post-op Pain Management:    Induction:   PONV Risk Score and Plan:   Airway Management Planned:   Additional Equipment:   Intra-op Plan:   Post-operative Plan:   Informed Consent: I have reviewed the patients History and Physical, chart, labs and discussed the procedure including the risks, benefits and alternatives for the proposed anesthesia with the patient or authorized representative who has indicated his/her understanding and acceptance.   Dental Advisory Given  Plan Discussed with: CRNA  Anesthesia Plan Comments:         Anesthesia Quick Evaluation

## 2018-09-16 NOTE — Anesthesia Postprocedure Evaluation (Signed)
Anesthesia Post Note  Patient: Joshua Mckee  Procedure(s) Performed: EXPLORATORY LAPAROTOMY (N/A Abdomen) PARTIAL SMALL BOWEL RESECTION (N/A Abdomen) LYSIS OF ADHESION (N/A Abdomen)  Patient location during evaluation: PACU Anesthesia Type: General Level of consciousness: awake and alert and patient cooperative Pain management: satisfactory to patient Vital Signs Assessment: post-procedure vital signs reviewed and stable Respiratory status: spontaneous breathing and patient connected to nasal cannula oxygen Cardiovascular status: stable Postop Assessment: no apparent nausea or vomiting Anesthetic complications: no     Last Vitals:  Vitals:   09/16/18 1215 09/16/18 1230  BP: (!) 133/91 (!) 124/93  Pulse:    Resp: 19 (!) 25  Temp:    SpO2: 98% 100%    Last Pain:  Vitals:   09/16/18 1230  TempSrc:   PainSc: 2                  Mehreen Azizi

## 2018-09-16 NOTE — Addendum Note (Signed)
Addendum  created 09/16/18 1301 by Vista Deck, CRNA   Clinical Note Signed

## 2018-09-16 NOTE — Progress Notes (Addendum)
PROGRESS NOTE    Joshua Mckee  HAL:[id number] DOB: Mar 03, 1941 DOA: 09/12/2018 PCP: Dettinger, Fransisca Kaufmann, MD    Brief Narrative:  77 year old male with a history of seizure disorder, prostate cancer, bowel obstruction in the past, admitted to the hospital with nausea, vomiting, abdominal distention.  Found to have bowel obstruction on imaging.  NG tube placed for decompression.  General surgery following.  He underwent bowel resection with lysis of adhesions on 12/31   Assessment & Plan:   Principal Problem:   SBO (small bowel obstruction) (HCC) Active Problems:   Seizure ([place])   Prostate cancer ([place])   History of CVA (cerebrovascular accident)   High transaminase levels   AKI (acute kidney injury) ([place])   Small bowel obstruction due to adhesions ([place])   1. Small bowel obstruction.  Status post exploratory laparotomy, partial small bowel resection and lysis of adhesions.  NG tube is been removed.  Further postoperative care per general surgery. 2. History of CVA.  Resume aspirin and statin once able. 3. Prostate cancer.  Follows with urology.  Resume Flomax when able. 4. Seizure disorder.  Continue on Keppra. 5. Hypokalemia.  Replaced.  Magnesium normal. 6. Acute kidney injury.  Creatinine has trended up since admission.  Possibly related to contrast-induced nephropathy.  Continue on IV hydration.  Continue to monitor renal function and urine output.   DVT prophylaxis: Lovenox Code Status: Full code Family Communication: No family present Disposition Plan: Discharge home once bowel function has recovered   Consultants:   General surgery  Procedures:     Antimicrobials:       Subjective: Patient seen in his room postoperatively.  Feels that his pain is controlled.  No nausea or vomiting at this time.  Objective: Vitals:   09/16/18 1300 09/16/18 1315 09/16/18 1321 09/16/18 1722  BP: (!) 121/91 122/90 114/85 104/78  Pulse:   (!) 114 93  Resp: 17 16 14    Temp:    98 F (36.7 C) 98.5 F (36.9 C)  TempSrc:   Oral Oral  SpO2: 99% 97% 96% 93%  Weight:      Height:        Intake/Output Summary (Last 24 hours) at 09/16/2018 1814 Last data filed at 09/16/2018 1557 Gross per 24 hour  Intake 3823.34 ml  Output 2425 ml  Net 1398.34 ml   Filed Weights   09/12/18 0143 09/12/18 1759 09/16/18 0921  Weight: 80 kg 73.9 kg 74 kg    Examination:  General exam: Alert, awake, oriented x 3 Respiratory system: Clear to auscultation. Respiratory effort normal. Cardiovascular system:RRR. No murmurs, rubs, gallops. Gastrointestinal system: Abdomen is diffusely tender. No organomegaly or masses felt.  no bowel sounds appreciated. Central nervous system: Alert and oriented. No focal neurological deficits. Extremities: No C/C/E, +pedal pulses Skin: No rashes, lesions or ulcers Psychiatry: Judgement and insight appear normal. Mood & affect appropriate.   Data Reviewed: I have personally reviewed following labs and imaging studies  CBC: Recent Labs  Lab 09/12/18 0210 09/13/18 0604 09/14/18 0510 09/15/18 0539  WBC 7.6 5.1 2.9* 8.8  NEUTROABS 6.4  --   --   --   HGB 13.6 13.8 13.9 13.4  HCT 42.7 43.0 43.3 41.4  MCV 87.7 87.9 87.1 86.3  PLT 202 221 221 735   Basic Metabolic Panel: Recent Labs  Lab 09/12/18 0210 09/13/18 0604 09/14/18 0510 09/15/18 0539  NA 137 139 137 133*  K 3.8 3.7 3.0* 3.7  CL 101 96* 99 94*  CO2 26  32 28 29  GLUCOSE 145* 108* 120* 131*  BUN 20 32* 38* 54*  CREATININE 0.85 0.82 0.90 1.35*  CALCIUM 9.1 8.8* 8.4* 8.2*  MG  --   --  2.2 2.2  PHOS  --   --  1.7* 2.8   GFR: Estimated Creatinine Clearance: 41.7 mL/min (A) (by C-G formula based on SCr of 1.35 mg/dL (H)). Liver Function Tests: Recent Labs  Lab 09/12/18 0210 09/13/18 0604  AST 84* 62*  ALT 77* 66*  ALKPHOS 80 70  BILITOT 0.8 1.3*  PROT 7.8 7.6  ALBUMIN 4.0 3.9   Recent Labs  Lab 09/12/18 0210  LIPASE 29   No results for input(s): AMMONIA in the  last 168 hours. Coagulation Profile: No results for input(s): INR, PROTIME in the last 168 hours. Cardiac Enzymes: No results for input(s): CKTOTAL, CKMB, CKMBINDEX, TROPONINI in the last 168 hours. BNP (last 3 results) No results for input(s): PROBNP in the last 8760 hours. HbA1C: No results for input(s): HGBA1C in the last 72 hours. CBG: Recent Labs  Lab 09/13/18 2201 09/14/18 1023 09/15/18 0745 09/16/18 0749 09/16/18 1735  GLUCAP 111* 134* 123* 119* 107*   Lipid Profile: No results for input(s): CHOL, HDL, LDLCALC, TRIG, CHOLHDL, LDLDIRECT in the last 72 hours. Thyroid Function Tests: No results for input(s): TSH, T4TOTAL, FREET4, T3FREE, THYROIDAB in the last 72 hours. Anemia Panel: No results for input(s): VITAMINB12, FOLATE, FERRITIN, TIBC, IRON, RETICCTPCT in the last 72 hours. Sepsis Labs: No results for input(s): PROCALCITON, LATICACIDVEN in the last 168 hours.  Recent Results (from the past 240 hour(s))  Surgical pcr screen     Status: None   Collection Time: 09/15/18  9:23 PM  Result Value Ref Range Status   MRSA, PCR NEGATIVE NEGATIVE Final   Staphylococcus aureus NEGATIVE NEGATIVE Final    Comment: (NOTE) The Xpert SA Assay (FDA approved for NASAL specimens in patients 17 years of age and older), is one component of a comprehensive surveillance program. It is not intended to diagnose infection nor to guide or monitor treatment. Performed at [facility], [address] [place], [place] [address]          Radiology Studies: Ct Abdomen Pelvis W Contrast  Result Date: 09/15/2018 CLINICAL DATA:  77 year old male with a history of bowel obstruction. EXAM: CT ABDOMEN AND PELVIS WITH CONTRAST TECHNIQUE: Multidetector CT imaging of the abdomen and pelvis was performed using the standard protocol following bolus administration of intravenous contrast. CONTRAST:  16mL ISOVUE-300 IOPAMIDOL (ISOVUE-300) INJECTION 61%, 178mL ISOVUE-300 IOPAMIDOL (ISOVUE-300)  INJECTION 61% COMPARISON:  CT 09/12/2018 FINDINGS: Lower chest: Since the prior CT there has been development of new nodular and consolidative airspace disease at the lung bases. Hepatobiliary: Similar appearance of the liver with multiple low-density cystic lesions of left and right liver. None of these demonstrate enhancement on the delay. Unremarkable gallbladder Pancreas: Unremarkable pancreas Spleen: Unremarkable spleen Adrenals/Urinary Tract: Unremarkable adrenal glands. Kidneys demonstrate no hydronephrosis or nephrolithiasis. Unremarkable course the bilateral ureters. Bosniak 1 cyst of the left kidney again demonstrated. Stomach/Bowel: Hiatal hernia. Gastric tube terminates within the stomach. Redemonstration of dilation of majority of small bowel with a discrete transition point in the right lower quadrant, unchanged from the prior CT (image 45 series 2). Beyond this small bowel is completely decompressed. Decompressed colon with small volume of stool. Diverticula are present without inflammation. Vascular/Lymphatic: Mild atherosclerotic changes. No aortic aneurysm. Mesenteric arteries and bilateral renal arteries are patent. Bilateral iliac arteries and proximal femoral arteries  are patent. 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 Reproductive: Unremarkable pelvic structures Other: Fat containing bilateral inguinal hernia. Musculoskeletal: Degenerative changes of the spine. No displaced fracture identified. IMPRESSION: Unchanged appearance of small bowel obstruction, with unchanged appearance of the previously identified transition point in the right lower quadrant. Volume loss and/or consolidation at the bilateral lung bases. Infection is/aspiration pneumonitis not favored, though can not be excluded. Gastric tube terminates within the stomach. Electronically Signed   By: Corrie Mckusick D.O.   On: 09/15/2018 13:06        Scheduled  Meds: . [START ON 09/17/2018] enoxaparin (LOVENOX) injection  40 mg Subcutaneous Q24H  . hydrochlorothiazide  25 mg Oral Daily  . ketorolac  15 mg Intravenous Q6H  . levETIRAcetam  500 mg Oral BID  . pantoprazole  40 mg Oral Q1200  . tamsulosin  0.4 mg Oral Daily   Continuous Infusions: . sodium chloride 100 mL/hr at 09/16/18 1557     LOS: 4 days    Time spent: 7mins    Kathie Dike, MD [facility] Pager [phone]  If 7PM-7AM, please contact night-coverage [email or web address] Password [facility] 09/16/2018, 6:14 PM

## 2018-09-16 NOTE — Transfer of Care (Signed)
Immediate Anesthesia Transfer of Care Note  Patient: Joshua Mckee  Procedure(s) Performed: EXPLORATORY LAPAROTOMY (N/A Abdomen) PARTIAL SMALL BOWEL RESECTION (N/A Abdomen) LYSIS OF ADHESION (N/A Abdomen)  Patient Location: PACU  Anesthesia Type:General  Level of Consciousness: awake, alert , oriented and patient cooperative  Airway & Oxygen Therapy: Patient Spontanous Breathing and Patient connected to nasal cannula oxygen  Post-op Assessment: Report given to RN and Post -op Vital signs reviewed and stable  Post vital signs: Reviewed and stable  Last Vitals:  Vitals Value Taken Time  BP 124/100 09/16/2018 12:00 PM  Temp    Pulse 103 09/16/2018 12:04 PM  Resp 21 09/16/2018 12:04 PM  SpO2 95 % 09/16/2018 12:04 PM  Vitals shown include unvalidated device data.  Last Pain:  Vitals:   09/16/18 0921  TempSrc: Oral  PainSc: 0-No pain         Complications: No apparent anesthesia complications

## 2018-09-16 NOTE — Progress Notes (Signed)
Requested by patient and family to lock patient's wallet up with security. Patient informs RN that his girlfriend [name]) is a thief and that he is certain she is stealing from him. Notified security. Patient's family state that he has 32 dollars currently in the wallet. Placed consult for social work.

## 2018-09-16 NOTE — Anesthesia Postprocedure Evaluation (Signed)
Anesthesia Post Note  Patient: Glendale Youngblood  Procedure(s) Performed: EXPLORATORY LAPAROTOMY (N/A Abdomen) PARTIAL SMALL BOWEL RESECTION (N/A Abdomen) LYSIS OF ADHESION (N/A Abdomen)  Patient location during evaluation: PACU Anesthesia Type: General Level of consciousness: awake and alert and oriented Pain management: pain level controlled Vital Signs Assessment: post-procedure vital signs reviewed and stable Respiratory status: spontaneous breathing Cardiovascular status: stable Postop Assessment: no apparent nausea or vomiting Anesthetic complications: no     Last Vitals:  Vitals:   09/16/18 0921 09/16/18 0925  BP: 112/81   Pulse: (!) 105   Resp:    Temp: 36.8 C   SpO2: (!) 88% 94%    Last Pain:  Vitals:   09/16/18 0921  TempSrc: Oral  PainSc: 0-No pain                 Azriel Dancy A

## 2018-09-16 NOTE — Interval H&P Note (Signed)
History and Physical Interval Note:  09/16/2018 10:11 AM  Joshua Mckee  has presented today for surgery, with the diagnosis of small bowel obstruction  The various methods of treatment have been discussed with the patient and family. After consideration of risks, benefits and other options for treatment, the patient has consented to  Procedure(s): EXPLORATORY LAPAROTOMY (N/A) as a surgical intervention .  The patient's history has been reviewed, patient examined, no change in status, stable for surgery.  I have reviewed the patient's chart and labs.  Questions were answered to the patient's satisfaction.     Aviva Signs

## 2018-09-16 NOTE — Op Note (Signed)
Patient:  Joshua Mckee  DOB:  10-Jan-1941  MRN:  [id number]   Preop Diagnosis: Small bowel obstruction  Postop Diagnosis: Small bowel obstruction secondary to adhesive disease  Procedure: Exploratory laparotomy, partial small bowel resection, lysis of adhesions  Surgeon: Aviva Signs, MD  Anes: General endotracheal  Indications: Patient is a 77 year old black male status post exploratory laparotomy for bowel obstruction in the remote past who presents with recurrent small bowel obstruction.  Despite conservative measures, the obstruction persisted.  Now presents for exploratory laparotomy.  The risks and benefits of the procedure including bleeding, infection, bowel resection, and the possibility of recurrence of the obstruction were fully explained to the patient, who gave informed consent.  Procedure note: The patient was placed in supine position.  After induction of general endotracheal anesthesia, the abdomen was prepped and draped using the usual sterile technique with DuraPrep.  Surgical site confirmation was performed.  A midline incision was made from above the umbilicus through the previous surgical incision site down to just below the umbilicus.  The peritoneal cavity was entered into without difficulty.  There was a significant amount of omental adhesion to the anterior abdominal wall.  This was lysed using Bovie electrocautery.  The small bowel was noted to be significantly dilated down to an area in the right lower quadrant.  There was a single band of omentum that came across the small bowel just proximal to the terminal ileum and this was lysed using the LigaSure.  Multiple other adhesions were noted in the right lower quadrant and these were freed away sharply using Bovie electrocautery and scissors.  A significant amount time was spent lysing these adhesions.  The ascending colon and appendix were visualized and noted to be within normal limits.  Ultimately, the bowel wall in the  distal small bowel was very thin.  It was not amenable to serosal repair.  It was elected to proceed with a partial small bowel resection of approximately 12 to 15 cm of bowel.  Before the anastomosis was performed, the small bowel contents proximally were milked into the stomach.  2 canisters of enteric contents were then evacuated through the NG tube.  A GIA-55 was placed proximally and distally across the bowel and fired.  The mesentery was then divided using the LigaSure.  A side-to-side enteroenterostomy was then performed using a GIA 55 stapler.  The enterotomy was closed using a TA 60 stapler.  The staple line was bolstered using 3-0 silk sutures.  The mesenteric defect was closed using 3-0 silk interrupted sutures.  No spillage occurred.  The bowel was returned into the abdominal cavity in an orderly fashion.  The abdominal cavity was then copiously irrigated with normal saline.  All operating personnel then changed their gloves.  The fascia was reapproximated using a looped 0 Novafil running suture.  The subcutaneous layer was irrigated with normal saline and the skin was closed using staples.  Betadine ointment and dry sterile dressings were applied.  All tape and needle counts were correct at the end of the procedure.  The patient was extubated in the operating room and transferred to PACU in stable condition.  Complications: None  EBL: 25 cc  Specimen: Small bowel

## 2018-09-17 LAB — CBC
HCT: 39.1 % (ref 39.0–52.0)
Hemoglobin: 12.3 g/dL — ABNORMAL LOW (ref 13.0–17.0)
MCH: 28 pg (ref 26.0–34.0)
MCHC: 31.5 g/dL (ref 30.0–36.0)
MCV: 88.9 fL (ref 80.0–100.0)
Platelets: 225 10*3/uL (ref 150–400)
RBC: 4.4 MIL/uL (ref 4.22–5.81)
RDW: 15.1 % (ref 11.5–15.5)
WBC: 10.1 10*3/uL (ref 4.0–10.5)
nRBC: 0 % (ref 0.0–0.2)

## 2018-09-17 LAB — BASIC METABOLIC PANEL
Anion gap: 8 (ref 5–15)
BUN: 37 mg/dL — ABNORMAL HIGH (ref 8–23)
CO2: 26 mmol/L (ref 22–32)
Calcium: 7.4 mg/dL — ABNORMAL LOW (ref 8.9–10.3)
Chloride: 100 mmol/L (ref 98–111)
Creatinine, Ser: 1.18 mg/dL (ref 0.61–1.24)
GFR calc non Af Amer: 58 mL/min — ABNORMAL LOW (ref >60)
Glucose, Bld: 89 mg/dL (ref 70–99)
POTASSIUM: 3.8 mmol/L (ref 3.5–5.1)
Sodium: 134 mmol/L — ABNORMAL LOW (ref 135–145)

## 2018-09-17 LAB — MAGNESIUM: Magnesium: 2.3 mg/dL (ref 1.7–2.4)

## 2018-09-17 LAB — PHOSPHORUS: PHOSPHORUS: 3 mg/dL (ref 2.5–4.6)

## 2018-09-17 LAB — GLUCOSE, CAPILLARY: Glucose-Capillary: 96 mg/dL (ref 70–99)

## 2018-09-17 MED ORDER — POTASSIUM CHLORIDE 10 MEQ/100ML IV SOLN
10.0000 meq | INTRAVENOUS | Status: AC
  Administered 2018-09-17 (×2): 10 meq via INTRAVENOUS
  Filled 2018-09-17 (×2): qty 100

## 2018-09-17 MED ORDER — HEPARIN SODIUM (PORCINE) 5000 UNIT/ML IJ SOLN
5000.0000 [IU] | Freq: Three times a day (TID) | INTRAMUSCULAR | Status: DC
  Administered 2018-09-17 – 2018-09-20 (×10): 5000 [IU] via SUBCUTANEOUS
  Filled 2018-09-17 (×11): qty 1

## 2018-09-17 NOTE — Progress Notes (Signed)
Patient ambulated to nurses station and back to room with assistance. Educated regarding importance of use of incentive spirometer and deep breathing. Patient denies flatus or bowel movement. Bowel sounds present in lower quadrants. Will continue to monitor.

## 2018-09-17 NOTE — Evaluation (Signed)
Physical Therapy Evaluation Patient Details Name: Joshua Mckee MRN: [id number] DOB: [birth year] Today's Date: 09/17/2018   History of Present Illness  78 year old male with a history of seizure disorder, prostate cancer, bowel obstruction in the past, admitted to the hospital with nausea, vomiting, abdominal distention.  Found to have bowel obstruction on imaging.  NG tube placed for decompression.  General surgery following.  He underwent bowel resection with lysis of adhesions on 12/31  Clinical Impression  Pt normally ambulates with a cane but has a walker at home.  PT states friend will be with him.  Pt mobile with supervision at this time.    Follow Up Recommendations Home health PT    Equipment Recommendations    none   Recommendations for Other Services   none    Precautions / Restrictions Precautions Precautions: Fall Restrictions Weight Bearing Restrictions: No      Mobility  Bed Mobility Overal bed mobility: Needs Assistance Bed Mobility: Supine to Sit     Supine to sit: Min guard        Transfers Overall transfer level: Modified independent Equipment used: Rolling walker (2 wheeled)                Ambulation/Gait Ambulation/Gait assistance: Supervision Gait Distance (Feet): 24 Feet Assistive device: Rolling walker (2 wheeled) Gait Pattern/deviations: Decreased step length - left;Decreased step length - right Gait velocity: decreased               Pertinent Vitals/Pain Pain Assessment: 0-10 Pain Score: 3  Pain Descriptors / Indicators: Sore Pain Intervention(s): Limited activity within patient's tolerance    Home Living Family/patient expects to be discharged to:: Private residence Living Arrangements: Spouse/significant other Available Help at Discharge: Available PRN/intermittently;Friend(s) Type of Home: Apartment Home Access: Level entry     Home Layout: One level Home Equipment: Walker - 2 wheels;Cane - single point;Bedside  commode;Shower seat      Prior Function Level of Independence: Independent with assistive device(s)         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, drives     Hand Dominance   Dominant Hand: Right    Extremity/Trunk Assessment        Lower Extremity Assessment Lower Extremity Assessment: Overall WFL for tasks assessed LLE Deficits / Details: grossly 4/5    Cervical / Trunk Assessment Cervical / Trunk Assessment: Kyphotic  Communication   Communication: No difficulties  Cognition Arousal/Alertness: Awake/alert Behavior During Therapy: WFL for tasks assessed/performed Overall Cognitive Status: Within Functional Limits for tasks assessed                                           Exercises General Exercises - Lower Extremity Ankle Circles/Pumps: Both;10 reps Quad Sets: Both;10 reps Gluteal Sets: Both;10 reps Hip ABduction/ADduction: Both;5 reps Heel Raises: Both;5 reps   Assessment/Plan    PT Assessment Patient needs continued PT services  PT Problem List Decreased strength;Decreased range of motion;Decreased balance;Pain       PT Treatment Interventions Gait training;Therapeutic exercise    PT Goals (Current goals can be found in the Care Plan section)  Acute Rehab PT Goals Patient Stated Goal: to go home PT Goal Formulation: With patient Time For Goal Achievement: 09/19/18 Potential to Achieve Goals: Good    Frequency Min 4X/week   Barriers to discharge  none      Co-evaluation  PT goals addressed during session: Mobility/safety with mobility;Proper use of DME;Strengthening/ROM         AM-PAC PT "6 Clicks" Mobility  Outcome Measure Help needed turning from your back to your side while in a flat bed without using bedrails?: A Little Help needed moving from lying on your back to sitting on the side of a flat bed without using bedrails?: A Little Help needed moving to and from a bed to a chair (including a wheelchair)?:  A Little Help needed standing up from a chair using your arms (e.g., wheelchair or bedside chair)?: None Help needed to walk in hospital room?: A Little Help needed climbing 3-5 steps with a railing? : A Little 6 Click Score: 19    End of Session Equipment Utilized During Treatment: Gait belt Activity Tolerance: Patient tolerated treatment well Patient left: in chair;with call bell/phone within reach Nurse Communication: Mobility status PT Visit Diagnosis: Unsteadiness on feet (R26.81)    Time: 8185-9093 PT Time Calculation (min) (ACUTE ONLY): 40 min   Charges:   PT Evaluation $PT Eval Moderate Complexity: 1 Mod PT Treatments $Therapeutic Exercise: 8-22 mins      Rayetta Humphrey, PT CLT [phone] 09/17/2018, 9:14 AM

## 2018-09-17 NOTE — Progress Notes (Signed)
[facility] Progress Note  1 Day Post-Op  Subjective: Doing fair. Having some hiccups but no nausea/vomiting reported. Taking in some ice. No flatus or BM. Pain ok. Has worked with PT and recommending home PT.   Objective: Vital signs in last 24 hours: Temp:  [97.8 F (36.6 C)-98.6 F (37 C)] 98.6 F (37 C) (01/01 0524) Pulse Rate:  [93-114] 100 (01/01 0524) Resp:  [14-25] 19 (01/01 0524) BP: (104-133)/(78-100) 107/86 (01/01 0524) SpO2:  [93 %-100 %] 96 % (01/01 0524) Last BM Date: 09/15/18  Intake/Output from previous day: 12/31 0701 - 01/01 0700 In: 3813.4 [I.V.:3813.4] Out: 2575 [Urine:550; Blood:25] Intake/Output this shift: No intake/output data recorded.  General appearance: alert, cooperative and no distress Resp: normal work breathing, Pulling 1000 on IS GI: soft, mildly distended, appropriately tender, staples c/d/i with honeycomb dressing in place  Lab Results:  Recent Labs    09/15/18 0539 09/17/18 0546  WBC 8.8 10.1  HGB 13.4 12.3*  HCT 41.4 39.1  PLT 223 225   BMET Recent Labs    09/15/18 0539 09/17/18 0546  NA 133* 134*  K 3.7 3.8  CL 94* 100  CO2 29 26  GLUCOSE 131* 89  BUN 54* 37*  CREATININE 1.35* 1.18  CALCIUM 8.2* 7.4*    Assessment/Plan: Mr. Chestnut is a 78 yo POD 1 s/p Ex lap, SBR For SBO. Doing fair. NG out and doing ok but no bowel function and having hiccups. Was up in chair and has worked with PT. Doing well on IS.  PRN For pain IS, OOB as tolerated Sips/ ice only today, await more bowel function Foley in place until POD 2, on NS @ 100, K improved but will give 20 meq to keep up since only on NS H&H stable, no leukocytosis  SCDs, heparin sq   LOS: 5 days    Virl Cagey 09/17/2018

## 2018-09-17 NOTE — Progress Notes (Addendum)
PROGRESS NOTE    Joshua Mckee  DVV:[id number] DOB: 1941/05/13 DOA: 09/12/2018 PCP: Dettinger, Fransisca Kaufmann, MD   Brief Narrative:  78 year old male with a history of seizure disorder, prostate cancer, bowel obstruction in the past, admitted to the hospital with nausea, vomiting, abdominal distention.  Found to have bowel obstruction on imaging.  NG tube placed for decompression.  General surgery following.  He underwent bowel resection with lysis of adhesions on 12/31  Assessment & Plan:   Principal Problem:   SBO (small bowel obstruction) (HCC) Active Problems:   Seizure ([place])   Prostate cancer ([place])   History of CVA (cerebrovascular accident)   High transaminase levels   AKI (acute kidney injury) ([place])   Small bowel obstruction due to adhesions ([place])  1. Small bowel obstruction.  Status post exploratory laparotomy, partial small bowel resection and lysis of adhesions.  NG tube is been removed.  Further postoperative care per general surgery. 2. History of CVA.  Resume aspirin and statin once able and on diet. 3. Prostate cancer.  Follows with urology.  Resume Flomax when able. 4. Seizure disorder.  Continue on Keppra. 5. Hypokalemia-resolved.  Recheck labs in a.m. 6. Acute kidney injury.  Creatinine has trended up since admission.  Possibly related to contrast-induced nephropathy.  Continue on IV hydration with normal saline as he is +1.2 L.  Continue to monitor renal function and urine output.  Repeat BMP ordered for a.m. removed Toradol and HCTZ from list of medications today.   DVT prophylaxis: Lovenox changed to heparin. Code Status: Full Family Communication: None at bedside Disposition Plan: Discharge to home once bowel function has recovered.  PT evaluation.   Consultants:   General surgery  Procedures:   Bowel resection with lysis of adhesions 12/31  Antimicrobials:   None   Subjective: Patient seen and evaluated today with 1 episode of nausea last night with  no vomiting.  He has not had any flatus or bowel movement per nursing staff.  He denies any significant pain currently.  Objective: Vitals:   09/16/18 1722 09/16/18 2029 09/17/18 0014 09/17/18 0524  BP: 104/78 111/83 124/80 107/86  Pulse: 93 (!) 104 (!) 105 100  Resp:  18 17 19   Temp: 98.5 F (36.9 C) 98.1 F (36.7 C) 98.5 F (36.9 C) 98.6 F (37 C)  TempSrc: Oral Oral Oral Oral  SpO2: 93% 96% 95% 96%  Weight:      Height:        Intake/Output Summary (Last 24 hours) at 09/17/2018 0719 Last data filed at 09/17/2018 0300 Gross per 24 hour  Intake 3813.41 ml  Output 2575 ml  Net 1238.41 ml   Filed Weights   09/12/18 0143 09/12/18 1759 09/16/18 0921  Weight: 80 kg 73.9 kg 74 kg    Examination:  General exam: Appears calm and comfortable  Respiratory system: Clear to auscultation. Respiratory effort normal. Cardiovascular system: S1 & S2 heard, RRR. No JVD, murmurs, rubs, gallops or clicks. No pedal edema. Gastrointestinal system: Abdomen is distended, soft and nontender. No organomegaly or masses felt.  No bowel sounds appreciated.  Dressings are clean dry and intact. Central nervous system: Alert and oriented. No focal neurological deficits. Extremities: Symmetric 5 x 5 power. Skin: No rashes, lesions or ulcers Psychiatry: Judgement and insight appear normal. Mood & affect appropriate.     Data Reviewed: I have personally reviewed following labs and imaging studies  CBC: Recent Labs  Lab 09/12/18 0210 09/13/18 0604 09/14/18 0510 09/15/18 6269 09/17/18 4854  WBC 7.6 5.1 2.9* 8.8 10.1  NEUTROABS 6.4  --   --   --   --   HGB 13.6 13.8 13.9 13.4 12.3*  HCT 42.7 43.0 43.3 41.4 39.1  MCV 87.7 87.9 87.1 86.3 88.9  PLT 202 221 221 223 732   Basic Metabolic Panel: Recent Labs  Lab 09/12/18 0210 09/13/18 0604 09/14/18 0510 09/15/18 0539  NA 137 139 137 133*  K 3.8 3.7 3.0* 3.7  CL 101 96* 99 94*  CO2 26 32 28 29  GLUCOSE 145* 108* 120* 131*  BUN 20 32* 38* 54*   CREATININE 0.85 0.82 0.90 1.35*  CALCIUM 9.1 8.8* 8.4* 8.2*  MG  --   --  2.2 2.2  PHOS  --   --  1.7* 2.8   GFR: Estimated Creatinine Clearance: 41.7 mL/min (A) (by C-G formula based on SCr of 1.35 mg/dL (H)). Liver Function Tests: Recent Labs  Lab 09/12/18 0210 09/13/18 0604  AST 84* 62*  ALT 77* 66*  ALKPHOS 80 70  BILITOT 0.8 1.3*  PROT 7.8 7.6  ALBUMIN 4.0 3.9   Recent Labs  Lab 09/12/18 0210  LIPASE 29   No results for input(s): AMMONIA in the last 168 hours. Coagulation Profile: No results for input(s): INR, PROTIME in the last 168 hours. Cardiac Enzymes: No results for input(s): CKTOTAL, CKMB, CKMBINDEX, TROPONINI in the last 168 hours. BNP (last 3 results) No results for input(s): PROBNP in the last 8760 hours. HbA1C: No results for input(s): HGBA1C in the last 72 hours. CBG: Recent Labs  Lab 09/13/18 2201 09/14/18 1023 09/15/18 0745 09/16/18 0749 09/16/18 1735  GLUCAP 111* 134* 123* 119* 107*   Lipid Profile: No results for input(s): CHOL, HDL, LDLCALC, TRIG, CHOLHDL, LDLDIRECT in the last 72 hours. Thyroid Function Tests: No results for input(s): TSH, T4TOTAL, FREET4, T3FREE, THYROIDAB in the last 72 hours. Anemia Panel: No results for input(s): VITAMINB12, FOLATE, FERRITIN, TIBC, IRON, RETICCTPCT in the last 72 hours. Sepsis Labs: No results for input(s): PROCALCITON, LATICACIDVEN in the last 168 hours.  Recent Results (from the past 240 hour(s))  Surgical pcr screen     Status: None   Collection Time: 09/15/18  9:23 PM  Result Value Ref Range Status   MRSA, PCR NEGATIVE NEGATIVE Final   Staphylococcus aureus NEGATIVE NEGATIVE Final    Comment: (NOTE) The Xpert SA Assay (FDA approved for NASAL specimens in patients 53 years of age and older), is one component of a comprehensive surveillance program. It is not intended to diagnose infection nor to guide or monitor treatment. Performed at [facility], [address], [place], [place]  [address]          Radiology Studies: Ct Abdomen Pelvis W Contrast  Result Date: 09/15/2018 CLINICAL DATA:  78 year old male with a history of bowel obstruction. EXAM: CT ABDOMEN AND PELVIS WITH CONTRAST TECHNIQUE: Multidetector CT imaging of the abdomen and pelvis was performed using the standard protocol following bolus administration of intravenous contrast. CONTRAST:  35mL ISOVUE-300 IOPAMIDOL (ISOVUE-300) INJECTION 61%, 191mL ISOVUE-300 IOPAMIDOL (ISOVUE-300) INJECTION 61% COMPARISON:  CT 09/12/2018 FINDINGS: Lower chest: Since the prior CT there has been development of new nodular and consolidative airspace disease at the lung bases. Hepatobiliary: Similar appearance of the liver with multiple low-density cystic lesions of left and right liver. None of these demonstrate enhancement on the delay. Unremarkable gallbladder Pancreas: Unremarkable pancreas Spleen: Unremarkable spleen Adrenals/Urinary Tract: Unremarkable adrenal glands. Kidneys demonstrate no hydronephrosis or nephrolithiasis. Unremarkable course the bilateral  ureters. Bosniak 1 cyst of the left kidney again demonstrated. Stomach/Bowel: Hiatal hernia. Gastric tube terminates within the stomach. Redemonstration of dilation of majority of small bowel with a discrete transition point in the right lower quadrant, unchanged from the prior CT (image 45 series 2). Beyond this small bowel is completely decompressed. Decompressed colon with small volume of stool. Diverticula are present without inflammation. Vascular/Lymphatic: Mild atherosclerotic changes. No aortic aneurysm. Mesenteric arteries and bilateral renal arteries are patent. Bilateral iliac arteries and proximal femoral arteries are patent. 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 Reproductive: Unremarkable pelvic structures Other: Fat containing bilateral inguinal hernia. Musculoskeletal: Degenerative changes of the spine. No  displaced fracture identified. IMPRESSION: Unchanged appearance of small bowel obstruction, with unchanged appearance of the previously identified transition point in the right lower quadrant. Volume loss and/or consolidation at the bilateral lung bases. Infection is/aspiration pneumonitis not favored, though can not be excluded. Gastric tube terminates within the stomach. Electronically Signed   By: Corrie Mckusick D.O.   On: 09/15/2018 13:06        Scheduled Meds: . enoxaparin (LOVENOX) injection  40 mg Subcutaneous Q24H  . hydrochlorothiazide  25 mg Oral Daily  . levETIRAcetam  500 mg Oral BID  . pantoprazole  40 mg Oral Q1200  . tamsulosin  0.4 mg Oral Daily   Continuous Infusions: . sodium chloride 100 mL/hr at 09/17/18 0300     LOS: 5 days    Time spent: 30 minutes    Oz Gammel Darleen Crocker, DO [facility] Pager [phone]  If 7PM-7AM, please contact night-coverage [email or web address] Password [facility] 09/17/2018, 7:19 AM

## 2018-09-18 ENCOUNTER — Encounter (HOSPITAL_COMMUNITY): Payer: Self-pay | Admitting: General Surgery

## 2018-09-18 LAB — BASIC METABOLIC PANEL
Anion gap: 10 (ref 5–15)
BUN: 26 mg/dL — AB (ref 8–23)
CO2: 24 mmol/L (ref 22–32)
CREATININE: 0.81 mg/dL (ref 0.61–1.24)
Calcium: 7.4 mg/dL — ABNORMAL LOW (ref 8.9–10.3)
Chloride: 102 mmol/L (ref 98–111)
GFR calc Af Amer: >60 mL/min (ref >60)
GFR calc non Af Amer: >60 mL/min (ref >60)
Glucose, Bld: 71 mg/dL (ref 70–99)
Potassium: 3.7 mmol/L (ref 3.5–5.1)
Sodium: 136 mmol/L (ref 135–145)

## 2018-09-18 MED ORDER — BISACODYL 10 MG RE SUPP
10.0000 mg | Freq: Two times a day (BID) | RECTAL | Status: DC
  Administered 2018-09-18 – 2018-09-21 (×6): 10 mg via RECTAL
  Filled 2018-09-18 (×6): qty 1

## 2018-09-18 MED ORDER — KCL IN DEXTROSE-NACL 20-5-0.45 MEQ/L-%-% IV SOLN
INTRAVENOUS | Status: DC
  Administered 2018-09-18 – 2018-09-19 (×2): via INTRAVENOUS

## 2018-09-18 NOTE — Progress Notes (Signed)
Removed patient's foley per order, patient tolerated well. Catheter intact upon removal.

## 2018-09-18 NOTE — Care Management Note (Signed)
Case Management Note  Patient Details  Name: Joshua Mckee MRN: [id number] Date of Birth: 11-27-1940  Subjective/Objective:         SBO. S/p lysis of adhesions, waiting on bowel function to return. From home with significant other. Has RW and cane at home. Active with [facility] for PT services prior to arrival. Seen by PT, recommending to continue [facility] PT.   Patient agreeable.        Action/Plan: DC home with resumption of home health. Will need new orders for home health. Tim, [facility] rep notified.   Expected Discharge Date:     09/19/2018             Expected Discharge Plan:  [place]  In-House Referral:     Discharge planning Services  CM Consult  Post Acute Care Choice:  [place], Resumption of Svcs/PTA Provider Choice offered to:  Patient  DME Arranged:    DME Agency:     HH Arranged:  PT [place]:  [facility] (formerly [employer])  Status of Service:  Completed, signed off  If discussed at [employer], dates discussed:    Additional Comments:  [name], [name], RN 09/18/2018, 11:25 AM

## 2018-09-18 NOTE — Progress Notes (Signed)
2 Days Post-Op  Subjective: Patient denies any incisional pain.  Objective: Vital signs in last 24 hours: Temp:  [98 F (36.7 C)-98.4 F (36.9 C)] 98.4 F (36.9 C) (01/02 0625) Pulse Rate:  [92-110] 92 (01/02 0625) Resp:  [18-20] 18 (01/02 0625) BP: (127-128)/(82-86) 127/86 (01/02 0625) SpO2:  [96 %-97 %] 96 % (01/02 0625) Last BM Date: 09/16/19  Intake/Output from previous day: 01/01 0701 - 01/02 0700 In: 0  Out: 1000 [Urine:1000] Intake/Output this shift: No intake/output data recorded.  General appearance: alert, cooperative and no distress Resp: clear to auscultation bilaterally Cardio: regular rate and rhythm, S1, S2 normal, no murmur, click, rub or gallop GI: Soft, nontender, nondistended.  Incision healing well.  Occasional bowel sounds appreciated.  Lab Results:  Recent Labs    09/17/18 0546  WBC 10.1  HGB 12.3*  HCT 39.1  PLT 225   BMET Recent Labs    09/17/18 0546 09/18/18 0428  NA 134* 136  K 3.8 3.7  CL 100 102  CO2 26 24  GLUCOSE 89 71  BUN 37* 26*  CREATININE 1.18 0.81  CALCIUM 7.4* 7.4*   PT/INR No results for input(s): LABPROT, INR in the last 72 hours.  Studies/Results: No results found.  Anti-infectives: Anti-infectives (From admission, onward)   Start     Dose/Rate Route Frequency Ordered Stop   09/16/18 0600  ceFAZolin (ANCEF) IVPB 2g/100 mL premix     2 g 200 mL/hr over 30 Minutes Intravenous On call to O.R. 09/15/18 1306 09/16/18 1039      Assessment/Plan: s/p Procedure(s): EXPLORATORY LAPAROTOMY PARTIAL SMALL BOWEL RESECTION LYSIS OF ADHESION Impression: Stable on postoperative day 2.  Patient has been stable from cardiac standpoint and will DC telemetry.  Will start full liquid diet and Dulcolax suppositories.  Continue ambulating patient.  LOS: 6 days    Aviva Signs 09/18/2018

## 2018-09-18 NOTE — Progress Notes (Signed)
PROGRESS NOTE    Joshua Mckee  ZWC:[id number] DOB: Dec 02, 1940 DOA: 09/12/2018 PCP: Dettinger, Fransisca Kaufmann, MD   Brief Narrative:  78 year old male with a history of seizure disorder, prostate cancer, bowel obstruction in the past, admitted to the hospital with nausea, vomiting, abdominal distention. Found to have bowel obstruction on imaging. NG tube placed for decompression. General surgery following. He underwent bowel resection with lysis of adhesions on 12/31  Assessment & Plan:   Principal Problem:   SBO (small bowel obstruction) (HCC) Active Problems:   Seizure ([place])   Prostate cancer ([place])   History of CVA (cerebrovascular accident)   High transaminase levels   AKI (acute kidney injury) ([place])   Small bowel obstruction due to adhesions ([place])  1. Small bowel obstruction.Status post exploratory laparotomy, partial small bowel resection and lysis of adhesions. NG tube is been removed. Further postoperative care per general surgery. 2. History of CVA. Resume aspirin and statin once able and on diet. 3. Prostate cancer. Follows with urology. Resume Flomax when able. 4. Seizure disorder. Continue on Keppra. 5. Hypokalemia-resolved.  Recheck labs in a.m. 6. Acute kidney injury-resolved.Possibly related to contrast-induced nephropathy.  Continue to avoid nephrotoxic agents to include Toradol and HCTZ for now.  May resume HCTZ once blood pressure starts to elevate and patient is off IV fluids and tolerating diet.   DVT prophylaxis:  Heparin Code Status: Full Family Communication: None at bedside Disposition Plan: Discharge to home once bowel function has recovered.  PT evaluation revealing need for home health PT.  AKI improving.   Consultants:   General surgery  Procedures:   Bowel resection with lysis of adhesions 12/31  Antimicrobials:   None  Subjective: Patient seen and evaluated today with some ongoing abdominal pain.  He denies any bowel  movements, but has been up and walking some more.  He has been tolerating most of his diet.  Objective: Vitals:   09/17/18 0524 09/17/18 1541 09/17/18 2216 09/18/18 0625  BP: 107/86 128/83 127/82 127/86  Pulse: 100 (!) 110 (!) 110 92  Resp: 19 20 20 18   Temp: 98.6 F (37 C) 98 F (36.7 C) 98 F (36.7 C) 98.4 F (36.9 C)  TempSrc: Oral Oral Oral Oral  SpO2: 96% 97% 97% 96%  Weight:      Height:        Intake/Output Summary (Last 24 hours) at 09/18/2018 0807 Last data filed at 09/17/2018 2200 Gross per 24 hour  Intake 0 ml  Output 1000 ml  Net -1000 ml   Filed Weights   09/12/18 0143 09/12/18 1759 09/16/18 0921  Weight: 80 kg 73.9 kg 74 kg    Examination:  General exam: Appears calm and comfortable  Respiratory system: Clear to auscultation. Respiratory effort normal. Cardiovascular system: S1 & S2 heard, RRR. No JVD, murmurs, rubs, gallops or clicks. No pedal edema. Gastrointestinal system: Abdomen is distended, soft and nontender. No organomegaly or masses felt. Normal bowel sounds heard.  Incision clean, dry, and intact. Central nervous system: Alert and oriented. No focal neurological deficits. Extremities: Symmetric 5 x 5 power. Skin: No rashes, lesions or ulcers Psychiatry: Judgement and insight appear normal. Mood & affect appropriate.     Data Reviewed: I have personally reviewed following labs and imaging studies  CBC: Recent Labs  Lab 09/12/18 0210 09/13/18 0604 09/14/18 0510 09/15/18 0539 09/17/18 0546  WBC 7.6 5.1 2.9* 8.8 10.1  NEUTROABS 6.4  --   --   --   --   HGB 13.6  13.8 13.9 13.4 12.3*  HCT 42.7 43.0 43.3 41.4 39.1  MCV 87.7 87.9 87.1 86.3 88.9  PLT 202 221 221 223 546   Basic Metabolic Panel: Recent Labs  Lab 09/13/18 0604 09/14/18 0510 09/15/18 0539 09/17/18 0546 09/18/18 0428  NA 139 137 133* 134* 136  K 3.7 3.0* 3.7 3.8 3.7  CL 96* 99 94* 100 102  CO2 32 28 29 26 24   GLUCOSE 108* 120* 131* 89 71  BUN 32* 38* 54* 37* 26*    CREATININE 0.82 0.90 1.35* 1.18 0.81  CALCIUM 8.8* 8.4* 8.2* 7.4* 7.4*  MG  --  2.2 2.2 2.3  --   PHOS  --  1.7* 2.8 3.0  --    GFR: Estimated Creatinine Clearance: 69.6 mL/min (by C-G formula based on SCr of 0.81 mg/dL). Liver Function Tests: Recent Labs  Lab 09/12/18 0210 09/13/18 0604  AST 84* 62*  ALT 77* 66*  ALKPHOS 80 70  BILITOT 0.8 1.3*  PROT 7.8 7.6  ALBUMIN 4.0 3.9   Recent Labs  Lab 09/12/18 0210  LIPASE 29   No results for input(s): AMMONIA in the last 168 hours. Coagulation Profile: No results for input(s): INR, PROTIME in the last 168 hours. Cardiac Enzymes: No results for input(s): CKTOTAL, CKMB, CKMBINDEX, TROPONINI in the last 168 hours. BNP (last 3 results) No results for input(s): PROBNP in the last 8760 hours. HbA1C: No results for input(s): HGBA1C in the last 72 hours. CBG: Recent Labs  Lab 09/14/18 1023 09/15/18 0745 09/16/18 0749 09/16/18 1735 09/17/18 0422  GLUCAP 134* 123* 119* 107* 96   Lipid Profile: No results for input(s): CHOL, HDL, LDLCALC, TRIG, CHOLHDL, LDLDIRECT in the last 72 hours. Thyroid Function Tests: No results for input(s): TSH, T4TOTAL, FREET4, T3FREE, THYROIDAB in the last 72 hours. Anemia Panel: No results for input(s): VITAMINB12, FOLATE, FERRITIN, TIBC, IRON, RETICCTPCT in the last 72 hours. Sepsis Labs: No results for input(s): PROCALCITON, LATICACIDVEN in the last 168 hours.  Recent Results (from the past 240 hour(s))  Surgical pcr screen     Status: None   Collection Time: 09/15/18  9:23 PM  Result Value Ref Range Status   MRSA, PCR NEGATIVE NEGATIVE Final   Staphylococcus aureus NEGATIVE NEGATIVE Final    Comment: (NOTE) The Xpert SA Assay (FDA approved for NASAL specimens in patients 50 years of age and older), is one component of a comprehensive surveillance program. It is not intended to diagnose infection nor to guide or monitor treatment. Performed at [facility], [address]  [place], [place] [address]          Radiology Studies: No results found.      Scheduled Meds: . bisacodyl  10 mg Rectal BID  . heparin injection (subcutaneous)  5,000 Units Subcutaneous Q8H  . levETIRAcetam  500 mg Oral BID  . pantoprazole  40 mg Oral Q1200  . tamsulosin  0.4 mg Oral Daily   Continuous Infusions: . dextrose 5 % and 0.45 % NaCl with KCl 20 mEq/L       LOS: 6 days    Time spent: 30 minutes    Giada Schoppe Darleen Crocker, DO [facility] Pager [phone]  If 7PM-7AM, please contact night-coverage [email or web address] Password [email or web address] 09/18/2018, 8:07 AM
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
## 2018-09-18 NOTE — Progress Notes (Signed)
Physical Therapy Treatment Patient Details Name: Joshua Mckee MRN: [id number] DOB: 1941-07-06 Today's Date: 09/18/2018    History of Present Illness 78 year old male with a history of seizure disorder, prostate cancer, bowel obstruction in the past, admitted to the hospital with nausea, vomiting, abdominal distention.  Found to have bowel obstruction on imaging.  NG tube placed for decompression.  General surgery following.  He underwent bowel resection with lysis of adhesions on 12/31    PT Comments    Patient demonstrates poor standing balance with near fall using SPC, required use of RW and encouraged to use RW when he returns home with understanding acknowledged with his caregiver present at bedside.  Patient demonstrates increased endurance/distance for gait training without loss of balance, required occasional verbal/tactile cueing to step closer to RW and continued sitting up in chair after therapy with his caregiver in room.  Patient will benefit from continued physical therapy in hospital and recommended venue below to increase strength, balance, endurance for safe ADLs and gait.   Follow Up Recommendations  Home health PT     Equipment Recommendations  None recommended by PT    Recommendations for Other Services       Precautions / Restrictions Precautions Precautions: Fall Restrictions Weight Bearing Restrictions: No    Mobility  Bed Mobility               General bed mobility comments: Patient presents up in chair   Transfers Overall transfer level: Needs assistance Equipment used: Rolling walker (2 wheeled);Straight cane Transfers: Sit to/from [facility] Sit to Stand: Min guard Stand pivot transfers: Min guard;Min assist       General transfer comment: very unsteady with near fall when using SPC, had to use RW for safety  Ambulation/Gait Ambulation/Gait assistance: Min guard Gait Distance (Feet): 75 Feet Assistive device: Rolling  walker (2 wheeled) Gait Pattern/deviations: Decreased step length - right;Decreased step length - left;Decreased stride length Gait velocity: near normal   General Gait Details: slightly labored cadence requiring verbal/tactile cue to step closer to RW with fair/good carryover, has kyphotic trunk and limited for ambulation due to c/o fatigue   Stairs             Wheelchair Mobility    Modified Rankin (Stroke Patients Only)       Balance Overall balance assessment: Needs assistance Sitting-balance support: Feet supported;No upper extremity supported Sitting balance-Leahy Scale: Good     Standing balance support: Single extremity supported;During functional activity Standing balance-Leahy Scale: Poor Standing balance comment: fair using RW                            Cognition Arousal/Alertness: Awake/alert Behavior During Therapy: WFL for tasks assessed/performed;Impulsive Overall Cognitive Status: Within Functional Limits for tasks assessed                                        Exercises General Exercises - Lower Extremity Ankle Circles/Pumps: Both;10 reps;Seated;AROM Long Arc Quad: Seated;AROM;Strengthening;Both;10 reps Hip Flexion/Marching: Seated;AROM;Strengthening;Both;10 reps    General Comments        Pertinent Vitals/Pain Pain Assessment: No/denies pain    Home Living                      Prior Function            PT Goals (  current goals can now be found in the care plan section) Acute Rehab PT Goals Patient Stated Goal: to go home PT Goal Formulation: With patient/family(with caregiver) Time For Goal Achievement: 09/22/18 Potential to Achieve Goals: Good Progress towards PT goals: Progressing toward goals    Frequency    Min 4X/week      PT Plan Current plan remains appropriate    Co-evaluation              AM-PAC PT "6 Clicks" Mobility   Outcome Measure  Help needed turning from your  back to your side while in a flat bed without using bedrails?: A Little Help needed moving from lying on your back to sitting on the side of a flat bed without using bedrails?: A Little Help needed moving to and from a bed to a chair (including a wheelchair)?: A Little Help needed standing up from a chair using your arms (e.g., wheelchair or bedside chair)?: A Little Help needed to walk in hospital room?: A Little Help needed climbing 3-5 steps with a railing? : A Lot 6 Click Score: 17    End of Session   Activity Tolerance: Patient tolerated treatment well;Patient limited by fatigue Patient left: in chair;with call bell/phone within reach;with family/visitor present Nurse Communication: Mobility status PT Visit Diagnosis: Unsteadiness on feet (R26.81);Other abnormalities of gait and mobility (R26.89);Muscle weakness (generalized) (M62.81)     Time: 0223-3612 PT Time Calculation (min) (ACUTE ONLY): 24 min  Charges:  $Gait Training: 8-22 mins $Therapeutic Exercise: 8-22 mins                     12:36 PM, 09/18/18 Joshua Mckee, MPT Physical Therapist with [facility] [phone] office [phone] mobile phone

## 2018-09-19 ENCOUNTER — Ambulatory Visit: Payer: Medicare Other

## 2018-09-19 DIAGNOSIS — M47816 Spondylosis without myelopathy or radiculopathy, lumbar region: Secondary | ICD-10-CM

## 2018-09-19 DIAGNOSIS — G40909 Epilepsy, unspecified, not intractable, without status epilepticus: Secondary | ICD-10-CM

## 2018-09-19 DIAGNOSIS — K219 Gastro-esophageal reflux disease without esophagitis: Secondary | ICD-10-CM

## 2018-09-19 DIAGNOSIS — I69344 Monoplegia of lower limb following cerebral infarction affecting left non-dominant side: Secondary | ICD-10-CM

## 2018-09-19 DIAGNOSIS — M48061 Spinal stenosis, lumbar region without neurogenic claudication: Secondary | ICD-10-CM

## 2018-09-19 DIAGNOSIS — N400 Enlarged prostate without lower urinary tract symptoms: Secondary | ICD-10-CM

## 2018-09-19 DIAGNOSIS — I10 Essential (primary) hypertension: Secondary | ICD-10-CM | POA: Diagnosis not present

## 2018-09-19 DIAGNOSIS — E785 Hyperlipidemia, unspecified: Secondary | ICD-10-CM

## 2018-09-19 LAB — GLUCOSE, CAPILLARY: GLUCOSE-CAPILLARY: 108 mg/dL — AB (ref 70–99)

## 2018-09-19 LAB — BASIC METABOLIC PANEL
Anion gap: 8 (ref 5–15)
BUN: 18 mg/dL (ref 8–23)
CO2: 27 mmol/L (ref 22–32)
Calcium: 7.6 mg/dL — ABNORMAL LOW (ref 8.9–10.3)
Chloride: 102 mmol/L (ref 98–111)
Creatinine, Ser: 0.78 mg/dL (ref 0.61–1.24)
GFR calc Af Amer: >60 mL/min (ref >60)
GLUCOSE: 123 mg/dL — AB (ref 70–99)
Potassium: 3.7 mmol/L (ref 3.5–5.1)
Sodium: 137 mmol/L (ref 135–145)

## 2018-09-19 MED ORDER — MAGNESIUM HYDROXIDE 400 MG/5ML PO SUSP
30.0000 mL | Freq: Two times a day (BID) | ORAL | Status: DC
  Administered 2018-09-19 (×2): 30 mL via ORAL
  Filled 2018-09-19 (×3): qty 30

## 2018-09-19 MED ORDER — HYDROCHLOROTHIAZIDE 25 MG PO TABS
25.0000 mg | ORAL_TABLET | Freq: Every day | ORAL | Status: DC
  Administered 2018-09-19 – 2018-09-21 (×3): 25 mg via ORAL
  Filled 2018-09-19 (×3): qty 1

## 2018-09-19 NOTE — Progress Notes (Signed)
PROGRESS NOTE    Joshua Mckee  UYQ:[id number] DOB: 04-28-1941 DOA: 09/12/2018 PCP: Dettinger, Fransisca Kaufmann, MD   Brief Narrative:  78 year old male with a history of seizure disorder, prostate cancer, bowel obstruction in the past, admitted to the hospital with nausea, vomiting, abdominal distention. Found to have bowel obstruction on imaging. NG tube placed for decompression. General surgery following. He underwent bowel resection with lysis of adhesions on 12/31  Assessment & Plan:   Principal Problem:   SBO (small bowel obstruction) (HCC) Active Problems:   Seizure ([place])   Prostate cancer ([place])   History of CVA (cerebrovascular accident)   High transaminase levels   AKI (acute kidney injury) ([place])   Small bowel obstruction due to adhesions ([place])  1. Small bowel obstruction.Status post exploratory laparotomy, partial small bowel resection and lysis of adhesions. NG tube is been removed. Further postoperative care per general surgery. 2. History of CVA. Resume aspirin and statin once ableand on diet. 3. Prostate cancer. Follows with urology. Resume Flomax when able. 4. Seizure disorder. Continue on Keppra. 5. Hypokalemia-resolved. Recheck labs in a.m. 6. Acute kidney injury-resolved.Possibly related to contrast-induced nephropathy.  Continue to avoid nephrotoxic agents to include Toradol and HCTZ for now.  May resume HCTZ today.  DC IV fluid.  Recheck a.m. BMP.   DVT prophylaxis: Heparin Code Status:Full Family Communication:None at bedside Disposition Plan:Discharge to home once bowel function has recovered.  PT evaluation revealing need for home health PT.  AKI improved.   Consultants:  General surgery  Procedures:  Bowel resection with lysis of adhesions 12/31  Antimicrobials:   None  Subjective: Patient seen and evaluated today with no new acute complaints or concerns. No acute concerns or events noted overnight.  He has had no bowel  movements as of yet, but appears to be tolerating a full liquid diet.  Objective: Vitals:   09/18/18 1357 09/18/18 2017 09/18/18 2203 09/19/18 0640  BP: (!) 128/94  132/88 131/89  Pulse: 93  92 (!) 109  Resp: 18  18 18   Temp: (!) 97.5 F (36.4 C)  98.1 F (36.7 C) 98.8 F (37.1 C)  TempSrc: Oral  Oral Oral  SpO2: 97% 92% 95% 98%  Weight:      Height:        Intake/Output Summary (Last 24 hours) at 09/19/2018 1140 Last data filed at 09/19/2018 0834 Gross per 24 hour  Intake 1668.48 ml  Output -  Net 1668.48 ml   Filed Weights   09/12/18 0143 09/12/18 1759 09/16/18 0921  Weight: 80 kg 73.9 kg 74 kg    Examination:  General exam: Appears calm and comfortable  Respiratory system: Clear to auscultation. Respiratory effort normal. Cardiovascular system: S1 & S2 heard, RRR. No JVD, murmurs, rubs, gallops or clicks. No pedal edema. Gastrointestinal system: Abdomen is nondistended, soft and nontender. No organomegaly or masses felt.  Hypoactive bowel sounds..  Incision clean dry and intact. Central nervous system: Alert and oriented. No focal neurological deficits. Extremities: Symmetric 5 x 5 power. Skin: No rashes, lesions or ulcers Psychiatry: Judgement and insight appear normal. Mood & affect appropriate.     Data Reviewed: I have personally reviewed following labs and imaging studies  CBC: Recent Labs  Lab 09/13/18 0604 09/14/18 0510 09/15/18 0539 09/17/18 0546  WBC 5.1 2.9* 8.8 10.1  HGB 13.8 13.9 13.4 12.3*  HCT 43.0 43.3 41.4 39.1  MCV 87.9 87.1 86.3 88.9  PLT 221 221 223 638   Basic Metabolic Panel: Recent Labs  Lab  09/14/18 0510 09/15/18 0539 09/17/18 0546 09/18/18 0428 09/19/18 0541  NA 137 133* 134* 136 137  K 3.0* 3.7 3.8 3.7 3.7  CL 99 94* 100 102 102  CO2 28 29 26 24 27   GLUCOSE 120* 131* 89 71 123*  BUN 38* 54* 37* 26* 18  CREATININE 0.90 1.35* 1.18 0.81 0.78  CALCIUM 8.4* 8.2* 7.4* 7.4* 7.6*  MG 2.2 2.2 2.3  --   --   PHOS 1.7* 2.8 3.0   --   --    GFR: Estimated Creatinine Clearance: 70.4 mL/min (by C-G formula based on SCr of 0.78 mg/dL). Liver Function Tests: Recent Labs  Lab 09/13/18 0604  AST 62*  ALT 66*  ALKPHOS 70  BILITOT 1.3*  PROT 7.6  ALBUMIN 3.9   No results for input(s): LIPASE, AMYLASE in the last 168 hours. No results for input(s): AMMONIA in the last 168 hours. Coagulation Profile: No results for input(s): INR, PROTIME in the last 168 hours. Cardiac Enzymes: No results for input(s): CKTOTAL, CKMB, CKMBINDEX, TROPONINI in the last 168 hours. BNP (last 3 results) No results for input(s): PROBNP in the last 8760 hours. HbA1C: No results for input(s): HGBA1C in the last 72 hours. CBG: Recent Labs  Lab 09/15/18 0745 09/16/18 0749 09/16/18 1735 09/17/18 0422 09/19/18 0729  GLUCAP 123* 119* 107* 96 108*   Lipid Profile: No results for input(s): CHOL, HDL, LDLCALC, TRIG, CHOLHDL, LDLDIRECT in the last 72 hours. Thyroid Function Tests: No results for input(s): TSH, T4TOTAL, FREET4, T3FREE, THYROIDAB in the last 72 hours. Anemia Panel: No results for input(s): VITAMINB12, FOLATE, FERRITIN, TIBC, IRON, RETICCTPCT in the last 72 hours. Sepsis Labs: No results for input(s): PROCALCITON, LATICACIDVEN in the last 168 hours.  Recent Results (from the past 240 hour(s))  Surgical pcr screen     Status: None   Collection Time: 09/15/18  9:23 PM  Result Value Ref Range Status   MRSA, PCR NEGATIVE NEGATIVE Final   Staphylococcus aureus NEGATIVE NEGATIVE Final    Comment: (NOTE) The Xpert SA Assay (FDA approved for NASAL specimens in patients 27 years of age and older), is one component of a comprehensive surveillance program. It is not intended to diagnose infection nor to guide or monitor treatment. Performed at [facility], [address] [place], [place] [address]          Radiology Studies: No results found.      Scheduled Meds: . bisacodyl  10 mg Rectal BID  . heparin  injection (subcutaneous)  5,000 Units Subcutaneous Q8H  . levETIRAcetam  500 mg Oral BID  . magnesium hydroxide  30 mL Oral q12n4p  . pantoprazole  40 mg Oral Q1200  . tamsulosin  0.4 mg Oral Daily   Continuous Infusions: . dextrose 5 % and 0.45 % NaCl with KCl 20 mEq/L 50 mL/hr at 09/19/18 0721     LOS: 7 days    Time spent: 30 minutes    Zaniel Marineau Darleen Crocker, DO [facility] Pager [phone]  If 7PM-7AM, please contact night-coverage [email or web address] Password [email or web address] 09/19/2018, 11:40 AM

## 2018-09-19 NOTE — Care Management Important Message (Signed)
Important Message  Patient Details  Name: Joshua Mckee MRN: [id number] Date of Birth: 10-26-1940   Medicare Important Message Given:  Yes    Shelda Altes 09/19/2018, 1:01 PM

## 2018-09-19 NOTE — Progress Notes (Signed)
3 Days Post-Op  Subjective: No complaints.  States he has had bowel movements but none are recorded.  Objective: Vital signs in last 24 hours: Temp:  [97.5 F (36.4 C)-98.8 F (37.1 C)] 98.8 F (37.1 C) (01/03 0640) Pulse Rate:  [92-109] 109 (01/03 0640) Resp:  [18] 18 (01/03 0640) BP: (128-132)/(88-94) 131/89 (01/03 0640) SpO2:  [92 %-98 %] 98 % (01/03 0640) Last BM Date: 09/16/19  Intake/Output from previous day: 01/02 0701 - 01/03 0700 In: 1668.5 [P.O.:720; I.V.:948.5] Out: -  Intake/Output this shift: No intake/output data recorded.  General appearance: alert, cooperative and no distress Resp: clear to auscultation bilaterally Cardio: regular rate and rhythm, S1, S2 normal, no murmur, click, rub or gallop GI: Soft, occasional bowel sounds appreciated.  Incision healing well.  Nontender.  Lab Results:  Recent Labs    09/17/18 0546  WBC 10.1  HGB 12.3*  HCT 39.1  PLT 225   BMET Recent Labs    09/18/18 0428 09/19/18 0541  NA 136 137  K 3.7 3.7  CL 102 102  CO2 24 27  GLUCOSE 71 123*  BUN 26* 18  CREATININE 0.81 0.78  CALCIUM 7.4* 7.6*   PT/INR No results for input(s): LABPROT, INR in the last 72 hours.  Studies/Results: No results found.  Anti-infectives: Anti-infectives (From admission, onward)   Start     Dose/Rate Route Frequency Ordered Stop   09/16/18 0600  ceFAZolin (ANCEF) IVPB 2g/100 mL premix     2 g 200 mL/hr over 30 Minutes Intravenous On call to O.R. 09/15/18 1306 09/16/18 1039      Assessment/Plan: s/p Procedure(s): EXPLORATORY LAPAROTOMY PARTIAL SMALL BOWEL RESECTION LYSIS OF ADHESION Impression: Postoperative day 3.  Patient is stable.  Awaiting full return of bowel function.  Continue ambulation.  LOS: 7 days    Aviva Signs 09/19/2018

## 2018-09-19 NOTE — Progress Notes (Signed)
Physical Therapy Treatment Patient Details Name: Joshua Mckee MRN: [id number] DOB: Mar 28, 1941 Today's Date: 09/19/2018    History of Present Illness 78 year old male with a history of seizure disorder, prostate cancer, bowel obstruction in the past, admitted to the hospital with nausea, vomiting, abdominal distention.  Found to have bowel obstruction on imaging.  NG tube placed for decompression.  General surgery following.  He underwent bowel resection with lysis of adhesions on 12/31    PT Comments    Patient able to transfer to [facility] to have a BM - RN notified to witness.  Patient demonstrates slightly labored movement for sit to stands, transfers and tends to lean over RW during gait training secondary to kyphotic trunk   Patient tolerated sitting up in chair to eat lunch after therapy.  Patient will benefit from continued physical therapy in hospital and recommended venue below to increase strength, balance, endurance for safe ADLs and gait.    Follow Up Recommendations  Home health PT     Equipment Recommendations  None recommended by PT    Recommendations for Other Services       Precautions / Restrictions Restrictions Weight Bearing Restrictions: No    Mobility  Bed Mobility Overal bed mobility: Needs Assistance Bed Mobility: Supine to Sit     Supine to sit: Min assist     General bed mobility comments: slow labored movement with use of bed rail  Transfers Overall transfer level: Needs assistance Equipment used: Rolling walker (2 wheeled) Transfers: Sit to/from [facility] Sit to Stand: Min assist Stand pivot transfers: Min assist       General transfer comment: labored slow movement   Ambulation/Gait Ambulation/Gait assistance: Min guard;Min assist Gait Distance (Feet): 75 Feet Assistive device: Rolling walker (2 wheeled) Gait Pattern/deviations: Decreased step length - right;Decreased step length - left;Decreased stride length Gait  velocity: near normal   General Gait Details: slightly labored cadence without loss of balance, but has tendency to speed cadence once fatigued to get to room faster, kyphotic trunk   Stairs             Wheelchair Mobility    Modified Rankin (Stroke Patients Only)       Balance Overall balance assessment: Needs assistance Sitting-balance support: Feet supported;No upper extremity supported Sitting balance-Leahy Scale: Good     Standing balance support: During functional activity;Bilateral upper extremity supported Standing balance-Leahy Scale: Fair Standing balance comment: fair using RW                            Cognition Arousal/Alertness: Awake/alert Behavior During Therapy: WFL for tasks assessed/performed Overall Cognitive Status: Within Functional Limits for tasks assessed                                        Exercises      General Comments        Pertinent Vitals/Pain Pain Assessment: No/denies pain    Home Living                      Prior Function            PT Goals (current goals can now be found in the care plan section) Acute Rehab PT Goals Patient Stated Goal: to go home PT Goal Formulation: With patient/family Time For Goal Achievement: 09/22/18 Potential to Achieve  Goals: Good Progress towards PT goals: Progressing toward goals    Frequency    Min 4X/week      PT Plan Current plan remains appropriate    Co-evaluation              AM-PAC PT "6 Clicks" Mobility   Outcome Measure  Help needed turning from your back to your side while in a flat bed without using bedrails?: A Little(requires use of bedrails) Help needed moving from lying on your back to sitting on the side of a flat bed without using bedrails?: Total Help needed moving to and from a bed to a chair (including a wheelchair)?: Total Help needed standing up from a chair using your arms (e.g., wheelchair or bedside  chair)?: A Little Help needed to walk in hospital room?: A Little Help needed climbing 3-5 steps with a railing? : A Lot 6 Click Score: 13    End of Session Equipment Utilized During Treatment: Gait belt Activity Tolerance: Patient tolerated treatment well;Patient limited by fatigue Patient left: in chair;with call bell/phone within reach Nurse Communication: Mobility status PT Visit Diagnosis: Unsteadiness on feet (R26.81);Other abnormalities of gait and mobility (R26.89);Muscle weakness (generalized) (M62.81)     Time: 6190-1222 PT Time Calculation (min) (ACUTE ONLY): 30 min  Charges:  $Gait Training: 8-22 mins $Therapeutic Activity: 8-22 mins                     3:06 PM, 09/19/18 [name], MPT Physical Therapist with [facility] [phone] office [phone] mobile phone

## 2018-09-20 LAB — BASIC METABOLIC PANEL
Anion gap: 6 (ref 5–15)
BUN: 16 mg/dL (ref 8–23)
CHLORIDE: 100 mmol/L (ref 98–111)
CO2: 31 mmol/L (ref 22–32)
Calcium: 7.6 mg/dL — ABNORMAL LOW (ref 8.9–10.3)
Creatinine, Ser: 0.71 mg/dL (ref 0.61–1.24)
GFR calc Af Amer: >60 mL/min (ref >60)
GFR calc non Af Amer: >60 mL/min (ref >60)
Glucose, Bld: 101 mg/dL — ABNORMAL HIGH (ref 70–99)
Potassium: 3.8 mmol/L (ref 3.5–5.1)
Sodium: 137 mmol/L (ref 135–145)

## 2018-09-20 LAB — GLUCOSE, CAPILLARY: GLUCOSE-CAPILLARY: 96 mg/dL (ref 70–99)

## 2018-09-20 NOTE — Progress Notes (Signed)
[facility] Progress Note  4 Days Post-Op  Subjective: Doing well. Feels better. Working with PT. Needs home health PT. Says he had 2 BMs. RN reported large BM that was not recorded. Eating soft diet. No issues.   Objective: Vital signs in last 24 hours: Temp:  [97.6 F (36.4 C)-98.4 F (36.9 C)] 97.6 F (36.4 C) (01/04 0644) Pulse Rate:  [89-98] 89 (01/04 0644) Resp:  [18] 18 (01/04 0644) BP: (111-134)/(78-94) 134/93 (01/04 0644) SpO2:  [94 %-96 %] 95 % (01/04 0644) Last BM Date: 09/20/18  Intake/Output from previous day: 01/03 0701 - 01/04 0700 In: 480 [P.O.:480] Out: -  Intake/Output this shift: Total I/O In: 240 [P.O.:240] Out: -   General appearance: alert, cooperative and no distress Resp: normal work breathing GI: soft, mildly distended, minimally tender, no erythema or drainage, honeycomb dressing in place  Lab Results:  No results for input(s): WBC, HGB, HCT, PLT in the last 72 hours. BMET Recent Labs    09/19/18 0541 09/20/18 0601  NA 137 137  K 3.7 3.8  CL 102 100  CO2 27 31  GLUCOSE 123* 101*  BUN 18 16  CREATININE 0.78 0.71  CALCIUM 7.6* 7.6*  Assessment/Plan: Joshua Mckee is a 83 s/p EXPLORATORY LAPAROTOMY PARTIAL SMALL BOWEL RESECTION, LYSIS OF ADHESION for a SBO. Doing well. Had BMs. Not taking any pain medication Working with PT Soft diet Will get Case Managers to arrange PT for home today, and will do home health RN given that he is post op/ had home PT already per the patient once a week  Likely home tomorrow     LOS: 8 days    Virl Cagey 09/20/2018

## 2018-09-20 NOTE — Progress Notes (Signed)
PROGRESS NOTE    Joshua Mckee  TDD:[id number] DOB: 06-15-1941 DOA: 09/12/2018 PCP: Dettinger, Fransisca Kaufmann, MD   Brief Narrative:  78 year old male with a history of seizure disorder, prostate cancer, bowel obstruction in the past, admitted to the hospital with nausea, vomiting, abdominal distention. Found to have bowel obstruction on imaging. NG tube placed for decompression. General surgery following. He underwent bowel resection with lysis of adhesions on 12/31  Assessment & Plan:   Principal Problem:   SBO (small bowel obstruction) (HCC) Active Problems:   Seizure ([place])   Prostate cancer ([place])   History of CVA (cerebrovascular accident)   High transaminase levels   AKI (acute kidney injury) ([place])   Small bowel obstruction due to adhesions ([place])  1. Small bowel obstruction.Status post exploratory laparotomy, partial small bowel resection and lysis of adhesions. NG tube is been removed. Further postoperative care per general surgery.  Appears to be recovering bowel function. 2. History of CVA. Resume aspirin and statin once ableand on diet. 3. Prostate cancer. Follows with urology. Resume Flomax when able. 4. Seizure disorder. Continue on Keppra. 5. Hypokalemia-resolved. 6. Acute kidney injury-resolved.Possibly related to contrast-induced nephropathy.Continue to avoid nephrotoxic agents to include HCTZ resumed with adequate blood pressure control.  Avoid Toradol.  No further need to recheck labs at this time.   DVT prophylaxis:Heparin Code Status:Full Family Communication:None at bedside Disposition Plan:Discharge to home once bowel function has recovered.PT evaluation revealing need for home health PT. AKI resolved.   Consultants:  General surgery  Procedures:  Bowel resection with lysis of adhesions 12/31  Antimicrobials:   None  Subjective: Patient seen and evaluated today with no new acute complaints or concerns. No acute concerns  or events noted overnight.  He states he did have some bowel movements yesterday and is tolerating his diet.  Objective: Vitals:   09/19/18 0640 09/19/18 1523 09/19/18 2212 09/20/18 0644  BP: 131/89 111/78 (!) 118/94 (!) 134/93  Pulse: (!) 109 92 98 89  Resp: 18 18 18 18   Temp: 98.8 F (37.1 C) 98.4 F (36.9 C) 97.8 F (36.6 C) 97.6 F (36.4 C)  TempSrc: Oral Oral Oral Oral  SpO2: 98% 94% 96% 95%  Weight:      Height:        Intake/Output Summary (Last 24 hours) at 09/20/2018 1055 Last data filed at 09/20/2018 0900 Gross per 24 hour  Intake 480 ml  Output -  Net 480 ml   Filed Weights   09/12/18 0143 09/12/18 1759 09/16/18 0921  Weight: 80 kg 73.9 kg 74 kg    Examination:  General exam: Appears calm and comfortable  Respiratory system: Clear to auscultation. Respiratory effort normal. Cardiovascular system: S1 & S2 heard, RRR. No JVD, murmurs, rubs, gallops or clicks. No pedal edema. Gastrointestinal system: Abdomen is nondistended, soft and nontender. No organomegaly or masses felt. Normal bowel sounds heard.  Midline incision clean dry and intact. Central nervous system: Alert and oriented. No focal neurological deficits. Extremities: Symmetric 5 x 5 power. Skin: No rashes, lesions or ulcers Psychiatry: Judgement and insight appear normal. Mood & affect appropriate.     Data Reviewed: I have personally reviewed following labs and imaging studies  CBC: Recent Labs  Lab 09/14/18 0510 09/15/18 0539 09/17/18 0546  WBC 2.9* 8.8 10.1  HGB 13.9 13.4 12.3*  HCT 43.3 41.4 39.1  MCV 87.1 86.3 88.9  PLT 221 223 623   Basic Metabolic Panel: Recent Labs  Lab 09/14/18 0510 09/15/18 0539 09/17/18 0546 09/18/18 0428  09/19/18 0541 09/20/18 0601  NA 137 133* 134* 136 137 137  K 3.0* 3.7 3.8 3.7 3.7 3.8  CL 99 94* 100 102 102 100  CO2 28 29 26 24 27 31   GLUCOSE 120* 131* 89 71 123* 101*  BUN 38* 54* 37* 26* 18 16  CREATININE 0.90 1.35* 1.18 0.81 0.78 0.71  CALCIUM  8.4* 8.2* 7.4* 7.4* 7.6* 7.6*  MG 2.2 2.2 2.3  --   --   --   PHOS 1.7* 2.8 3.0  --   --   --    GFR: Estimated Creatinine Clearance: 70.4 mL/min (by C-G formula based on SCr of 0.71 mg/dL). Liver Function Tests: No results for input(s): AST, ALT, ALKPHOS, BILITOT, PROT, ALBUMIN in the last 168 hours. No results for input(s): LIPASE, AMYLASE in the last 168 hours. No results for input(s): AMMONIA in the last 168 hours. Coagulation Profile: No results for input(s): INR, PROTIME in the last 168 hours. Cardiac Enzymes: No results for input(s): CKTOTAL, CKMB, CKMBINDEX, TROPONINI in the last 168 hours. BNP (last 3 results) No results for input(s): PROBNP in the last 8760 hours. HbA1C: No results for input(s): HGBA1C in the last 72 hours. CBG: Recent Labs  Lab 09/16/18 0749 09/16/18 1735 09/17/18 0422 09/19/18 0729 09/20/18 0723  GLUCAP 119* 107* 96 108* 96   Lipid Profile: No results for input(s): CHOL, HDL, LDLCALC, TRIG, CHOLHDL, LDLDIRECT in the last 72 hours. Thyroid Function Tests: No results for input(s): TSH, T4TOTAL, FREET4, T3FREE, THYROIDAB in the last 72 hours. Anemia Panel: No results for input(s): VITAMINB12, FOLATE, FERRITIN, TIBC, IRON, RETICCTPCT in the last 72 hours. Sepsis Labs: No results for input(s): PROCALCITON, LATICACIDVEN in the last 168 hours.  Recent Results (from the past 240 hour(s))  Surgical pcr screen     Status: None   Collection Time: 09/15/18  9:23 PM  Result Value Ref Range Status   MRSA, PCR NEGATIVE NEGATIVE Final   Staphylococcus aureus NEGATIVE NEGATIVE Final    Comment: (NOTE) The Xpert SA Assay (FDA approved for NASAL specimens in patients 58 years of age and older), is one component of a comprehensive surveillance program. It is not intended to diagnose infection nor to guide or monitor treatment. Performed at [facility], [address], [place], [place] [address]          Radiology Studies: No results  found.      Scheduled Meds: . bisacodyl  10 mg Rectal BID  . heparin injection (subcutaneous)  5,000 Units Subcutaneous Q8H  . hydrochlorothiazide  25 mg Oral Daily  . levETIRAcetam  500 mg Oral BID  . magnesium hydroxide  30 mL Oral q12n4p  . pantoprazole  40 mg Oral Q1200  . tamsulosin  0.4 mg Oral Daily   Continuous Infusions:   LOS: 8 days    Time spent: 30 minutes    Mckinley Olheiser Darleen Crocker, DO [facility] Pager [phone]  If 7PM-7AM, please contact night-coverage [email or web address] Password [email or web address] 09/20/2018, 10:55 AM

## 2018-09-21 ENCOUNTER — Emergency Department (HOSPITAL_COMMUNITY)
Admission: EM | Admit: 2018-09-21 | Discharge: 2018-09-22 | Disposition: A | Discharge status: Home / Self Care | Payer: Medicare Other | Attending: Emergency Medicine | Admitting: Emergency Medicine

## 2018-09-21 ENCOUNTER — Emergency Department (HOSPITAL_COMMUNITY): Payer: Medicare Other

## 2018-09-21 ENCOUNTER — Encounter (HOSPITAL_COMMUNITY): Payer: Self-pay | Admitting: *Deleted

## 2018-09-21 ENCOUNTER — Other Ambulatory Visit: Payer: Self-pay

## 2018-09-21 DIAGNOSIS — Z7982 Long term (current) use of aspirin: Secondary | ICD-10-CM | POA: Insufficient documentation

## 2018-09-21 DIAGNOSIS — I10 Essential (primary) hypertension: Secondary | ICD-10-CM | POA: Insufficient documentation

## 2018-09-21 DIAGNOSIS — Z8546 Personal history of malignant neoplasm of prostate: Secondary | ICD-10-CM | POA: Insufficient documentation

## 2018-09-21 DIAGNOSIS — Z8673 Personal history of transient ischemic attack (TIA), and cerebral infarction without residual deficits: Secondary | ICD-10-CM | POA: Insufficient documentation

## 2018-09-21 DIAGNOSIS — Z79899 Other long term (current) drug therapy: Secondary | ICD-10-CM | POA: Insufficient documentation

## 2018-09-21 DIAGNOSIS — M25552 Pain in left hip: Secondary | ICD-10-CM | POA: Diagnosis not present

## 2018-09-21 LAB — GLUCOSE, CAPILLARY: Glucose-Capillary: 86 mg/dL (ref 70–99)

## 2018-09-21 MED ORDER — BISACODYL 10 MG RE SUPP: 10.0000 mg | Freq: Every day | RECTAL | 0 refills | Status: DC | PRN

## 2018-09-21 MED ORDER — MAGNESIUM HYDROXIDE 400 MG/5ML PO SUSP: 30.0000 mL | Freq: Every day | ORAL | 0 refills | Status: DC | PRN

## 2018-09-21 MED ORDER — TRAMADOL HCL 50 MG PO TABS: 50.0000 mg | ORAL_TABLET | Freq: Four times a day (QID) | ORAL | 0 refills | Status: DC | PRN

## 2018-09-21 MED ORDER — ONDANSETRON 4 MG PO TBDP: 4.0000 mg | ORAL_TABLET | Freq: Four times a day (QID) | ORAL | 0 refills | Status: DC | PRN

## 2018-09-21 NOTE — Discharge Instructions (Signed)
Discharge Open Abdominal Surgery Instructions:  Common Complaints: Pain at the incision site is common. This will improve with time. Take your pain medications as described below. Some nausea is common and poor appetite. The main goal is to stay hydrated the first few days after surgery.   Diet/ Activity: Diet as tolerated. You have started and tolerated a diet in the hospital, and should continue to increase what you are able to eat.   You may not have a large appetite, but it is important to stay hydrated. Drink 64 ounces of water a day. Your appetite will return with time.  You can keep a dry dressing in place over your staples daily or as needed. Some minor pink/ blood tinged drainage is expected. This will stop in a few days after surgery.  Shower per your regular routine daily.  Do not take hot showers. Take warm showers that are less than 10 minutes. Path the incision dry. Wear an abdominal binder daily with activity. You do not have to wear this while sleeping or sitting.  Rest and listen to your body, but do not remain in bed all day.  Walk everyday for at least 15-20 minutes. Deep cough and move around every 1-2 hours in the first few days after surgery.  Do your incentive spirometer a few times an hour while awake.  Do not lift > 10 lbs, perform excessive bending, pushing, pulling, squatting for 6-8 weeks after surgery.  The activity restrictions and the abdominal binder are to prevent hernia formation at your incision while you are healing.  Do not place lotions or balms on your incision unless instructed to specifically by Dr. Arnoldo Morale.  Medication: Take tylenol and ibuprofen as needed for pain control, alternating every 4-6 hours.  Example:  Tylenol 1000mg  @ 6am, 12noon, 6pm, 80midnight (Do not exceed 4000mg  of tylenol a day). Ibuprofen 800mg  @ 9am, 3pm, 9pm, 3am (Do not exceed 3600mg  of ibuprofen a day).  Take Tramadol for breakthrough pain. Take milk of magnesia and/or dulcolax  suppository for constipation related to narcotic pain medication. If you do not have a bowel movement in 2 days, take an the milk of magnesia twice that day and then call the clinic.  Drink plenty of water to also prevent constipation.   Contact Information: If you have questions or concerns, please call our office, [phone], Monday- Thursday 8AM-5PM and Friday 8AM-12Noon.  If it is after hours or on the weekend, please call [facility] Main Number, [phone], and ask to speak to the surgeon on call for Dr. Arnoldo Morale or Dr. Constance Haw at [facility].    Open Small Bowel Resection, Care After This sheet gives you information about how to care for yourself after your procedure. Your health care provider may also give you more specific instructions. If you have problems or questions, contact your health care provider. What can I expect after the procedure? After the procedure, it is common to have:  Pain in your abdomen, especially along your incision. You will be given pain medicines to control this.  Tiredness. This is a normal part of the recovery process. Your energy level will return to normal over the next several weeks.  Constipation. You may be given a stool softener to help prevent this. Follow these instructions at home: Medicines  Take over-the-counter and prescription medicines only as told by your health care provider.  Do not drive or use heavy machinery while taking prescription pain medicine.  If you were prescribed an antibiotic medicine, use  it as told by your health care provider. Do not stop using the antibiotic even if you start to feel better. Activity  Return to your normal activities as told by your health care provider. Ask your health care provider what activities are safe for you.  Do not lift anything that is heavier than 10 lb (4.5 kg) until your health care provider says that it is safe.  Take frequent rest breaks during the day as needed.  Try to take short  walks every day for the amount of time that your health care provider suggests.  Avoid activities that require a lot of effort (are strenuous) for as long as told by your health care provider. Incision care   Follow instructions from your health care provider about how to take care of your incision. Make sure you: ? Wash your hands with soap and water before you change your bandage (dressing). If soap and water are not available, use hand sanitizer. ? Change your dressing as told by your health care provider. ? Leave stitches (sutures), skin glue, or adhesive strips in place. These skin closures may need to stay in place for 2 weeks or longer. If adhesive strip edges start to loosen and curl up, you may trim the loose edges. Do not remove adhesive strips completely unless your health care provider tells you to do that.  Check your incision area every day for signs of infection. Check for: ? More redness, swelling, or pain. ? More fluid or blood. ? Warmth. ? Pus or a bad smell. Do not take baths, swim, or use a hot tub until your health care provider approves. You may shower General instructions  Continue to practice deep breathing and coughing. If it hurts to cough, try holding a pillow against your abdomen as you cough.  To prevent or treat constipation while you are taking prescription pain medicine, your health care provider may recommend that you: ? Drink enough fluid to keep your urine clear or pale yellow. ? Take over-the-counter or prescription medicines. ? Eat foods that are high in fiber, such as fresh fruits and vegetables, whole grains, and beans. ? Limit foods that are high in fat and processed sugars, such as fried and sweet foods.  Do not use any products that contain nicotine or tobacco as told by your health care provider. These include cigarettes and e-cigarettes. If you need help quitting, ask your health care provider.  Keep all follow-up visits as told by your health  care provider. This is important. Contact a health care provider if:  You have pain that is not relieved with medicine.  You do not feel like eating.  You feel nauseous or you vomit.  You have constipation that is not relieved with prescribed stool softeners.  You have more redness, swelling, or pain around your incision.  You have more fluid or blood coming from your incision.  Your incision feels warm to the touch.  You have pus or a bad smell coming from your incision.  You have a fever. Get help right away if:  Your pain gets worse, even after you take pain medicine.  Your legs or arms hurt or become red or swollen.  You have chest pain.  You have trouble breathing. This information is not intended to replace advice given to you by your health care provider. Make sure you discuss any questions you have with your health care provider. Document Released: 02/05/2011 Document Revised: 06/12/2016 Document Reviewed: 06/04/2016 Elsevier Interactive Patient  Education  2019 [employer].

## 2018-09-21 NOTE — Progress Notes (Signed)
PROGRESS NOTE    Joshua Mckee  LJQ:[id number] DOB: 19-Oct-1940 DOA: 09/12/2018 PCP: Dettinger, Fransisca Kaufmann, MD   Brief Narrative:  78 year old male with a history of seizure disorder, prostate cancer, bowel obstruction in the past, admitted to the hospital with nausea, vomiting, abdominal distention. Found to have bowel obstruction on imaging. NG tube placed for decompression. General surgery following. He underwent bowel resection with lysis of adhesions on 12/31  Assessment & Plan:   Principal Problem:   SBO (small bowel obstruction) (HCC) Active Problems:   Seizure ([place])   Prostate cancer ([place])   History of CVA (cerebrovascular accident)   High transaminase levels   AKI (acute kidney injury) ([place])   Small bowel obstruction due to adhesions ([place])  1. Small bowel obstruction.Status post exploratory laparotomy, partial small bowel resection and lysis of adhesions. NG tube is been removed. Further postoperative care per general surgery.  Appears to be recovering bowel function and tolerating diet. 2. History of CVA. Resume aspirin and statin once ableand on diet. 3. Prostate cancer. Follows with urology. Resume Flomax when able. 4. Seizure disorder. Continue on Keppra. 5. Hypokalemia-resolved. 6. Acute kidney injury-resolved.Possibly related to contrast-induced nephropathy.Continue to avoid nephrotoxic agents to include HCTZ resumed with adequate blood pressure control.  Avoid Toradol.  No further need to recheck labs at this time.   DVT prophylaxis:Heparin Code Status:Full Family Communication:None at bedside Disposition Plan:Likely home today with home health per GS. Will plan to S/O.   Consultants:  General surgery  Procedures:  Bowel resection with lysis of adhesions 12/31  Antimicrobials:   None  Subjective: Patient seen and evaluated today with no new acute complaints or concerns. No acute concerns or events noted overnight.  He states  he did have some bowel movements yesterday and is tolerating his diet.   Objective: Vitals:   09/20/18 0644 09/20/18 1437 09/20/18 2134 09/21/18 0535  BP: (!) 134/93 (!) 137/97 109/72 116/82  Pulse: 89 94 81 89  Resp: 18 18 18 18   Temp: 97.6 F (36.4 C) 97.6 F (36.4 C) (!) 97.5 F (36.4 C) 98.2 F (36.8 C)  TempSrc: Oral Oral Oral Oral  SpO2: 95% 97% 97% 95%  Weight:      Height:        Intake/Output Summary (Last 24 hours) at 09/21/2018 1008 Last data filed at 09/20/2018 1304 Gross per 24 hour  Intake 240 ml  Output -  Net 240 ml   Filed Weights   09/12/18 0143 09/12/18 1759 09/16/18 0921  Weight: 80 kg 73.9 kg 74 kg    Examination:  General exam: Appears calm and comfortable  Respiratory system: Clear to auscultation. Respiratory effort normal. Cardiovascular system: S1 & S2 heard, RRR. No JVD, murmurs, rubs, gallops or clicks. No pedal edema. Gastrointestinal system: Abdomen is nondistended, soft and nontender. No organomegaly or masses felt. Normal bowel sounds heard.  Midline incision clean dry and intact. Central nervous system: Alert and oriented. No focal neurological deficits. Extremities: Symmetric 5 x 5 power. Skin: No rashes, lesions or ulcers Psychiatry: Judgement and insight appear normal. Mood & affect appropriate.     Data Reviewed: I have personally reviewed following labs and imaging studies  CBC: Recent Labs  Lab 09/15/18 0539 09/17/18 0546  WBC 8.8 10.1  HGB 13.4 12.3*  HCT 41.4 39.1  MCV 86.3 88.9  PLT 223 219   Basic Metabolic Panel: Recent Labs  Lab 09/15/18 0539 09/17/18 0546 09/18/18 0428 09/19/18 0541 09/20/18 0601  NA 133* 134* 136 137  137  K 3.7 3.8 3.7 3.7 3.8  CL 94* 100 102 102 100  CO2 29 26 24 27 31   GLUCOSE 131* 89 71 123* 101*  BUN 54* 37* 26* 18 16  CREATININE 1.35* 1.18 0.81 0.78 0.71  CALCIUM 8.2* 7.4* 7.4* 7.6* 7.6*  MG 2.2 2.3  --   --   --   PHOS 2.8 3.0  --   --   --    GFR: Estimated Creatinine  Clearance: 70.4 mL/min (by C-G formula based on SCr of 0.71 mg/dL). Liver Function Tests: No results for input(s): AST, ALT, ALKPHOS, BILITOT, PROT, ALBUMIN in the last 168 hours. No results for input(s): LIPASE, AMYLASE in the last 168 hours. No results for input(s): AMMONIA in the last 168 hours. Coagulation Profile: No results for input(s): INR, PROTIME in the last 168 hours. Cardiac Enzymes: No results for input(s): CKTOTAL, CKMB, CKMBINDEX, TROPONINI in the last 168 hours. BNP (last 3 results) No results for input(s): PROBNP in the last 8760 hours. HbA1C: No results for input(s): HGBA1C in the last 72 hours. CBG: Recent Labs  Lab 09/16/18 1735 09/17/18 0422 09/19/18 0729 09/20/18 0723 09/21/18 0724  GLUCAP 107* 96 108* 96 86   Lipid Profile: No results for input(s): CHOL, HDL, LDLCALC, TRIG, CHOLHDL, LDLDIRECT in the last 72 hours. Thyroid Function Tests: No results for input(s): TSH, T4TOTAL, FREET4, T3FREE, THYROIDAB in the last 72 hours. Anemia Panel: No results for input(s): VITAMINB12, FOLATE, FERRITIN, TIBC, IRON, RETICCTPCT in the last 72 hours. Sepsis Labs: No results for input(s): PROCALCITON, LATICACIDVEN in the last 168 hours.  Recent Results (from the past 240 hour(s))  Surgical pcr screen     Status: None   Collection Time: 09/15/18  9:23 PM  Result Value Ref Range Status   MRSA, PCR NEGATIVE NEGATIVE Final   Staphylococcus aureus NEGATIVE NEGATIVE Final    Comment: (NOTE) The Xpert SA Assay (FDA approved for NASAL specimens in patients 37 years of age and older), is one component of a comprehensive surveillance program. It is not intended to diagnose infection nor to guide or monitor treatment. Performed at [facility], [address] [place], [place] [address]          Radiology Studies: No results found.      Scheduled Meds: . bisacodyl  10 mg Rectal BID  . heparin injection (subcutaneous)  5,000 Units Subcutaneous Q8H  .  hydrochlorothiazide  25 mg Oral Daily  . levETIRAcetam  500 mg Oral BID  . magnesium hydroxide  30 mL Oral q12n4p  . pantoprazole  40 mg Oral Q1200  . tamsulosin  0.4 mg Oral Daily   Continuous Infusions:   LOS: 9 days    Time spent: 30 minutes    Kinberly Perris Darleen Crocker, DO [facility] Pager [phone]  If 7PM-7AM, please contact night-coverage [email or web address] Password [email or web address] 09/21/2018, 10:08 AM

## 2018-09-21 NOTE — ED Provider Notes (Signed)
[facility] EMERGENCY DEPARTMENT Provider Note   CSN: [id number] Arrival date & time: 09/21/18  2123     History   Chief Complaint Chief Complaint  Patient presents with  . Hip Pain    HPI Lynnwood Beckford is a 78 y.o. male.  The history is provided by the patient.  Hip Pain  The current episode started 6 to 12 hours ago. The problem occurs constantly. The problem has been gradually worsening. Pertinent negatives include no abdominal pain. The symptoms are aggravated by walking. The symptoms are relieved by rest.   Patient with history of previous stroke, hypertension presents with left hip pain.  Denies fall.  He was just discharged from the hospital after undergoing exploratory laparotomy for an SBO.  He has done well postoperatively.  He just got a hospital earlier today.  After leaving the hospital he noted pain throughout his left hip and thigh.  He thinks this is due to lying in the hospital bed.  He has no other acute complaints Past Medical History:  Diagnosis Date  . AKI (acute kidney injury) ([place]) 09/15/2018  . Aneurysm of right common iliac artery (HCC)    2.2 cm per CT 02-18-2017  . Arthritis   . GERD (gastroesophageal reflux disease)   . Hiatal hernia   . Hiccups    CHRONIC--- MULTIPLE ED VISITS  . History of alcohol abuse   . History of GI bleed    upper GI bleed from esophageal ulcer 01/ 2017 and 03/ 2017 from erosive esophageal gastiris  . 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 History of small bowel obstruction    04-06-2005  s/p  exp. lap. w/ lysis adhesions for partial sbo  . Hyperlipidemia   . Hyperlipidemia   . Hyperplasia of prostate with lower urinary tract symptoms (LUTS)   . Hypertension   . LAFB (left anterior fascicular block)   . Mild cognitive impairment with memory  loss    PROBABLE STROKE RESIDUAL BUT POSSIBLE PRIOR TO STROKE  . Partial seizure ([place])    x1  10-15-2016 at ED visit , post cva 08-27-2016  . Poor historian   . Prostate cancer [facility]) urologist-  dr Jeffie Pollock  . Schatzki's ring   . Stroke ([place])   . Substance abuse [facility])     Patient Active Problem List   Diagnosis Date Noted  . Small bowel obstruction due to adhesions ([place])   . AKI (acute kidney injury) ([place]) 09/15/2018  . SBO (small bowel obstruction) ([place]) 09/12/2018  . High transaminase levels 09/12/2018  . History of CVA (cerebrovascular accident) 08/28/2018  . Weakness 08/28/2018  . Overweight (BMI 25.0-29.9) 06/02/2018  . Alcoholism in remission ([place]) 06/13/2017  . Prostate cancer ([place]) 03/06/2017  . Hyperlipidemia 11/30/2016  . Seizure ([place]) 10/15/2016  . History of recent stroke 08/27/2016  . Hypertension 08/27/2016  . Esophageal ulcer 02/27/2016  . GI bleed 12/15/2015  . Reflux esophagitis   . Hiatal hernia     Past Surgical History:  Procedure Laterality Date  . BOWEL RESECTION N/A 09/16/2018   Procedure: PARTIAL SMALL BOWEL RESECTION;  Surgeon: Aviva Signs, MD;  Location: [facility] ORS;  Service: General;  Laterality: N/A;  . COLONOSCOPY N/A 03/08/2016   Procedure: COLONOSCOPY;  Surgeon: Daneil Dolin, MD;  Location: [facility] ENDO SUITE;  Service: Endoscopy;  Laterality: N/A;  1300-moved to 1400  . ESOPHAGOGASTRODUODENOSCOPY N/A 10/05/2015   Dr. Gala Romney: erosive reflux esophagitis, non-critcal Schatki's ring non-manipulated  . ESOPHAGOGASTRODUODENOSCOPY N/A 12/15/2015   Dr. Rourk:non bleeding esophageal ulcer/small HH  . ESOPHAGOGASTRODUODENOSCOPY N/A 03/08/2016   Procedure: ESOPHAGOGASTRODUODENOSCOPY (EGD);  Surgeon: Daneil Dolin, MD;  Location: [facility] ENDO SU[facility];  Service: Endoscopy;  Laterality: N/A;  . EXPLORATORY LAPAROTOMY W/ ADHESIOLYSIS  04/06/2005   partial SBO  . LAPAROTOMY N/A 09/16/2018   Procedure: EXPLORATORY LAPAROTOMY;  Surgeon: Aviva Signs, MD;  Location: [facility] ORS;   Service: General;  Laterality: N/A;  . LYSIS OF ADHESION N/A 09/16/2018   Procedure: LYSIS OF ADHESION;  Surgeon: Aviva Signs, MD;  Location: [facility];  Service: General;  Laterality: N/A;  . ORCHIECTOMY Bilateral 03/05/2017   Procedure: ORCHIECTOMY BILATERAL;  Surgeon: Irine Seal, MD;  Location: [facility];  Service: Urology;  Laterality: Bilateral;  . PROSTATE BIOPSY N/A 03/05/2017   Procedure: BIOPSY TRANSRECTAL ULTRASONIC PROSTATE (TUBP);  Surgeon: Irine Seal, MD;  Location: [facility];  Service: Urology;  Laterality: N/A;  . TRANSTHORACIC ECHOCARDIOGRAM  08/28/2016   mild to moderate LVH, ef 55-60%/  mild AR and TR/  trivial MR        Home Medications    Prior to Admission medications   Medication Sig Start Date End Date Taking? Authorizing Provider  aspirin 325 MG tablet Take 1 tablet (325 mg total) by mouth daily. 08/30/18   Patrecia Pour, MD  atorvastatin (LIPITOR) 80 MG tablet Take 1 tablet (80 mg total) by mouth daily. TAKE ONE TABLET BY MOUTH DAILY AT 6 PM. 08/29/18   Patrecia Pour, MD  bisacodyl (DULCOLAX) 10 MG suppository Place 1 suppository (10 mg total) rectally daily as needed for moderate constipation. 09/21/18   Virl Cagey, MD  hydrochlorothiazide (HYDRODIURIL) 25 MG tablet Take 1 tablet (25 mg total) by mouth daily. For blood pressure 06/02/18   Dettinger, Fransisca Kaufmann, MD  levETIRAcetam (KEPPRA) 500 MG tablet TAKE ONE TABLET BY MOUTH 2 TIMES A DAY. 06/02/18   Dettinger, Fransisca Kaufmann, MD  magnesium hydroxide (MILK OF MAGNESIA) 400 MG/5ML suspension Take 30 mLs by mouth daily as needed for mild constipation. 09/21/18   Virl Cagey, MD  ondansetron (ZOFRAN-ODT) 4 MG disintegrating tablet Take 1 tablet (4 mg total) by mouth every 6 (six) hours as needed for nausea. 09/21/18   Virl Cagey, MD  pantoprazole (PROTONIX) 40 MG tablet TAKE (1) TABLET BY MOUTH ONCE DAILY. 06/02/18   Dettinger, Fransisca Kaufmann, MD  tamsulosin (FLOMAX) 0.4 MG CAPS  capsule Take 1 capsule (0.4 mg total) by mouth daily. 06/02/18   Dettinger, Fransisca Kaufmann, MD  traMADol (ULTRAM) 50 MG tablet Take 1 tablet (50 mg total) by mouth every 6 (six) hours as needed for moderate pain or severe pain. 09/21/18   Virl Cagey, MD    Family History Family History  Problem Relation Age of Onset  . Colon cancer Neg Hx     Social History Social History   Tobacco Use  . Smoking status: Never Smoker  . Smokeless tobacco: Never Used  Substance Use Topics  . Alcohol use: No    Comment: hx alcohol abuse -- last drink 12/ 2017   . Drug use: No     Allergies   Patient has no known allergies.   Review of Systems Review of Systems  Gastrointestinal: Negative for abdominal pain and vomiting.  Musculoskeletal: Positive for arthralgias.  All other systems reviewed and  are negative.    Physical Exam Updated Vital Signs BP 116/87   Pulse 100   Temp 98.5 F (36.9 C) (Oral)   Resp 17   Ht 1.651 m (5\' 5" )   Wt 74 kg   SpO2 91%   BMI 27.15 kg/m   Physical Exam CONSTITUTIONAL: Elderly and frail HEAD: Normocephalic/atraumatic EYES: EOMI ENMT: Mucous membranes moist NECK: supple no meningeal signs SPINE/BACK: no Lumbar spine tenderness CV: S1/S2 noted, no murmurs/rubs/gallops noted LUNGS: Lungs are clear to auscultation bilaterally, no apparent distress ABDOMEN: soft, nontender, midline abdominal incision is intact and appears clean and dry.  There is no focal abdominal tenderness GU:no cva tenderness NEURO: Pt is awake/alert/appropriate, moves all extremitiesx4.  No facial droop.   EXTREMITIES: pulses normal/equal, full ROM, distal pulses found Via Doppler, tenderness to left thigh with range of motion.  No deformities or bruising.  There is no left knee or left ankle/foot tenderness SKIN: warm, color normal PSYCH: no abnormalities of mood noted, alert and oriented to situation   ED Treatments / Results  Labs (all labs ordered are listed, but only  abnormal results are displayed) Labs Reviewed - No data to display  EKG None  Radiology Dg Hip Unilat W Or Wo Pelvis 2-3 Views Left  Result Date: 09/22/2018 CLINICAL DATA:  Left hip pain. Recent surgery for small bowel obstruction. EXAM: DG HIP (WITH OR WITHOUT PELVIS) 2-3V LEFT COMPARISON:  09/15/2018 CT FINDINGS: Parasagittal skin staples from recent abdominal surgery are identified in the right lower quadrant. Degenerative disc disease with osteophytes of the included lower lumbar spine from L3 through S1 associated facet arthropathy is seen. Pelvis appears intact. Hip joints are maintained. There are vascular calcifications noted along course of the left common femoral and femoral arteries. Penile vascular calcifications are redemonstrated. IMPRESSION: 1. No acute osseous abnormality of the pelvis and left hip. 2. Lower lumbar spondylosis. Electronically Signed   By: Ashley Royalty M.D.   On: 09/22/2018 00:25    Procedures Procedures (including critical care time)  Medications Ordered in ED Medications - No data to display   Initial Impression / Assessment and Plan / ED Course  I have reviewed the triage vital signs and the nursing notes.  Pertinent  imaging results that were available during my care of the patient were reviewed by me and considered in my medical decision making (see chart for details).     11:56 PM Patient presents for left hip pain after just being released from the hospital postoperatively from SBO In terms of his recent surgery, he is doing well.  Due to persistent pain in his left leg will obtain x-ray.  He is otherwise well-appearing and I anticipate discharge.  He has  home health arranged from hospital stay 1:18 AM Imaging is negative for acute fracture.  Patient is resting comfortably.  He has walked in the ER.  Will discharge home. Final Clinical Impressions(s) / ED Diagnoses   Final diagnoses:  Left hip pain    ED Discharge Orders    None         Ripley Fraise, MD 09/22/18 [phone]

## 2018-09-21 NOTE — ED Triage Notes (Signed)
Pt returns to er by ems after being discharged from hospital today for further evaluation of left hip pain, pt states that he was in the hospital for sbo that required surgery,

## 2018-09-21 NOTE — Discharge Summary (Signed)
Physician Discharge Summary  Patient ID: Joshua Mckee MRN: [id number] DOB/AGE: 80-Oct-1942 80 y.o.  Admit date: 09/12/2018 Discharge date: 09/21/2018  Admission Diagnoses:  Discharge Diagnoses:  Principal Problem:   SBO (small bowel obstruction) ([place]) Active Problems:   Seizure ([place])   Prostate cancer ([place])   History of CVA (cerebrovascular accident)   High transaminase levels   AKI (acute kidney injury) ([place])   Small bowel obstruction due to adhesions [facility])   Discharged Condition: good  Hospital Course: Joshua Mckee is a 78 yo with a history of prostate cancer, seizure disorder who presented to the hospital with a SBO that failed conservative management with NG tube and NPO status. A repeat CT scan was performed on 12/30 that demonstrated a continued SBO and with this the patient was taken to surgery on 09/16/2018 by Dr. Arnoldo Morale. He underwent an Ex lap, SBR and lysis of adhesions.  The patient has been doing well after surgery but did have some degree of an ileus post operatively and slowly regained his bowel function. He has been transitioned from a liquid diet to a soft diet and has been tolerating this diet.  He reports having multiple BMs that have been soft and having flatus. He has been seen by PT during his hospitalization due to his prior stroke in the month of December and also deconditioning during his hospitalization, and PT recommended continuing home health PT.  He has been using minimal to no pain medication while in the hospital, and says that he feels comfortable going home with his girl friend.  He had an AKI during the admission that has resolved and has been started back on his home medication.    Home health has been notified of patient needing continued home PT and also home RN has been ordered too. The Social Workers on the weekend have arranged for this to occur for Monday 09/22/2018 and the patient and girl friend are aware.   Consults: Hospitalist admission ==>  Transferred care to Surgery post op  Significant Diagnostic Studies:  CT 12/27- high grade SBO, transition in the RLQ CT 12/30- unchanged SBO and transition point   Treatments: IV hydration, therapies: PT and NPO/NG tube ==> Ex lap with SBR/ Lysis of adhesions 09/16/2018   Discharge Exam: Blood pressure 116/82, pulse 89, temperature 98.2 F (36.8 C), temperature source Oral, resp. rate 18, height 5\' 5"  (1.651 m), weight 74 kg, SpO2 95 %. General appearance: alert, cooperative and no distress Resp: normal work breathing GI: soft, mildly distended, staples c/d/i with no erythema or drainage Extremities: extremities normal, atraumatic, no cyanosis or edema  Disposition: Discharge disposition: 01-Home or Self Care       Discharge Instructions    Call MD for:  difficulty breathing, headache or visual disturbances   Complete by:  As directed    Call MD for:  extreme fatigue   Complete by:  As directed    Call MD for:  persistant dizziness or light-headedness   Complete by:  As directed    Call MD for:  persistant nausea and vomiting   Complete by:  As directed    Call MD for:  redness, tenderness, or signs of infection (pain, swelling, redness, odor or green/yellow discharge around incision site)   Complete by:  As directed    Call MD for:  severe uncontrolled pain   Complete by:  As directed    Call MD for:  temperature >100.4   Complete by:  As directed  Diet - low sodium heart healthy   Complete by:  As directed    Driving Restrictions   Complete by:  As directed    Do not drive until seen by Dr. Arnoldo Morale in the clinic.   Increase activity slowly   Complete by:  As directed      Allergies as of 09/21/2018   No Known Allergies     Medication List    TAKE these medications   aspirin 325 MG tablet Take 1 tablet (325 mg total) by mouth daily.   atorvastatin 80 MG tablet Commonly known as:  LIPITOR Take 1 tablet (80 mg total) by mouth daily. TAKE ONE TABLET BY MOUTH  DAILY AT 6 PM.   bisacodyl 10 MG suppository Commonly known as:  DULCOLAX Place 1 suppository (10 mg total) rectally daily as needed for moderate constipation.   hydrochlorothiazide 25 MG tablet Commonly known as:  HYDRODIURIL Take 1 tablet (25 mg total) by mouth daily. For blood pressure   levETIRAcetam 500 MG tablet Commonly known as:  KEPPRA TAKE ONE TABLET BY MOUTH 2 TIMES A DAY.   magnesium hydroxide 400 MG/5ML suspension Commonly known as:  MILK OF MAGNESIA Take 30 mLs by mouth daily as needed for mild constipation.   ondansetron 4 MG disintegrating tablet Commonly known as:  ZOFRAN-ODT Take 1 tablet (4 mg total) by mouth every 6 (six) hours as needed for nausea.   pantoprazole 40 MG tablet Commonly known as:  PROTONIX TAKE (1) TABLET BY MOUTH ONCE DAILY.   tamsulosin 0.4 MG Caps capsule Commonly known as:  FLOMAX Take 1 capsule (0.4 mg total) by mouth daily.   traMADol 50 MG tablet Commonly known as:  ULTRAM Take 1 tablet (50 mg total) by mouth every 6 (six) hours as needed for moderate pain or severe pain.      Follow-up Information    Aviva Signs, MD. Schedule an appointment as soon as possible for a visit on 09/30/2018.   Specialty:  General Surgery Why:  Call to make appointment  Contact information: [address] [place] [address] [phone]        Dettinger, Fransisca Kaufmann, MD Follow up in 1 week(s).   Specialties:  Family Medicine, Cardiology Contact information: [place] [address] [phone]           Signed: Virl Cagey 09/21/2018, 12:06 PM

## 2018-09-21 NOTE — Progress Notes (Signed)
Patient and family state understanding of discharge instructions 

## 2018-09-22 NOTE — ED Notes (Signed)
Pt wheeled to waiting room. Pt verbalized understanding of discharge instructions.   

## 2018-09-22 NOTE — ED Notes (Signed)
Pt ambulatory from wheelchair to stretcher. 

## 2018-09-22 NOTE — ED Notes (Signed)
Called pts girlfriend who sates she has to find a ride to pick pt up.

## 2018-09-30 ENCOUNTER — Ambulatory Visit (INDEPENDENT_AMBULATORY_CARE_PROVIDER_SITE_OTHER): Payer: Self-pay | Admitting: General Surgery

## 2018-09-30 ENCOUNTER — Encounter: Payer: Self-pay | Admitting: General Surgery

## 2018-09-30 VITALS — BP 128/94 | HR 126 | Temp 96.0°F | Resp 20 | Wt 163.0 lb

## 2018-09-30 DIAGNOSIS — Z09 Encounter for follow-up examination after completed treatment for conditions other than malignant neoplasm: Secondary | ICD-10-CM

## 2018-09-30 NOTE — Progress Notes (Signed)
Subjective:     Joshua Mckee  Here for follow-up status post partial small bowel resection for bowel obstruction.  Patient doing well.  No nausea or vomiting have been noted.  His abdominal pain has resolved.  He is having regular bowel movements. Objective:    BP (!) 128/94 (BP Location: Left Arm, Patient Position: Sitting, Cuff Size: Normal)   Pulse (!) 126   Temp (!) 96 F (35.6 C) (Temporal)   Resp 20   Wt 163 lb (73.9 kg)   BMI 27.12 kg/m   General:  alert, cooperative and no distress  Abdomen soft, incision healing well.  Staples removed, Steri-Strips applied. Final pathology consistent with diagnosis.     Assessment:    Doing well postoperatively.    Plan:   Increase activity as able.  Follow-up here as needed.

## 2018-10-07 ENCOUNTER — Telehealth: Payer: Self-pay | Admitting: Gastroenterology

## 2018-10-07 ENCOUNTER — Ambulatory Visit: Payer: Medicare Other | Admitting: Gastroenterology

## 2018-10-07 ENCOUNTER — Encounter: Payer: Self-pay | Admitting: Internal Medicine

## 2018-10-07 NOTE — Telephone Encounter (Signed)
PATIENT WAS A NO SHOW AND LETTER SENT  °

## 2018-11-03 ENCOUNTER — Other Ambulatory Visit: Payer: Self-pay | Admitting: *Deleted

## 2018-11-03 MED ORDER — ATORVASTATIN CALCIUM 80 MG PO TABS: 80.0000 mg | ORAL_TABLET | Freq: Every day | ORAL | 3 refills | Status: DC

## 2018-11-07 ENCOUNTER — Telehealth: Payer: Self-pay | Admitting: Family Medicine

## 2018-11-11 ENCOUNTER — Other Ambulatory Visit: Payer: Self-pay | Admitting: Family Medicine

## 2018-11-12 ENCOUNTER — Emergency Department (HOSPITAL_COMMUNITY): Payer: Medicare Other

## 2018-11-12 ENCOUNTER — Inpatient Hospital Stay (HOSPITAL_COMMUNITY)
Admission: EM | Admit: 2018-11-12 | Discharge: 2018-11-24 | DRG: 641 | Disposition: A | Discharge status: Skilled Nursing Facility | Payer: Medicare Other | Attending: Family Medicine | Admitting: Family Medicine

## 2018-11-12 ENCOUNTER — Ambulatory Visit: Payer: Medicare Other | Admitting: *Deleted

## 2018-11-12 ENCOUNTER — Other Ambulatory Visit: Payer: Self-pay

## 2018-11-12 ENCOUNTER — Encounter (HOSPITAL_COMMUNITY): Payer: Self-pay | Admitting: Emergency Medicine

## 2018-11-12 DIAGNOSIS — T796XXA Traumatic ischemia of muscle, initial encounter: Secondary | ICD-10-CM | POA: Diagnosis present

## 2018-11-12 DIAGNOSIS — C61 Malignant neoplasm of prostate: Secondary | ICD-10-CM | POA: Diagnosis present

## 2018-11-12 DIAGNOSIS — I447 Left bundle-branch block, unspecified: Secondary | ICD-10-CM | POA: Diagnosis present

## 2018-11-12 DIAGNOSIS — K219 Gastro-esophageal reflux disease without esophagitis: Secondary | ICD-10-CM | POA: Diagnosis present

## 2018-11-12 DIAGNOSIS — E875 Hyperkalemia: Secondary | ICD-10-CM | POA: Diagnosis not present

## 2018-11-12 DIAGNOSIS — N400 Enlarged prostate without lower urinary tract symptoms: Secondary | ICD-10-CM | POA: Diagnosis present

## 2018-11-12 DIAGNOSIS — Z8673 Personal history of transient ischemic attack (TIA), and cerebral infarction without residual deficits: Secondary | ICD-10-CM

## 2018-11-12 DIAGNOSIS — I959 Hypotension, unspecified: Secondary | ICD-10-CM | POA: Diagnosis present

## 2018-11-12 DIAGNOSIS — D649 Anemia, unspecified: Secondary | ICD-10-CM | POA: Diagnosis present

## 2018-11-12 DIAGNOSIS — K7689 Other specified diseases of liver: Secondary | ICD-10-CM | POA: Diagnosis present

## 2018-11-12 DIAGNOSIS — F1011 Alcohol abuse, in remission: Secondary | ICD-10-CM | POA: Diagnosis present

## 2018-11-12 DIAGNOSIS — R7989 Other specified abnormal findings of blood chemistry: Secondary | ICD-10-CM | POA: Diagnosis present

## 2018-11-12 DIAGNOSIS — M899 Disorder of bone, unspecified: Secondary | ICD-10-CM | POA: Diagnosis present

## 2018-11-12 DIAGNOSIS — M25872 Other specified joint disorders, left ankle and foot: Secondary | ICD-10-CM | POA: Diagnosis present

## 2018-11-12 DIAGNOSIS — G40909 Epilepsy, unspecified, not intractable, without status epilepticus: Secondary | ICD-10-CM | POA: Diagnosis present

## 2018-11-12 DIAGNOSIS — E860 Dehydration: Secondary | ICD-10-CM | POA: Diagnosis present

## 2018-11-12 DIAGNOSIS — K56609 Unspecified intestinal obstruction, unspecified as to partial versus complete obstruction: Secondary | ICD-10-CM | POA: Diagnosis present

## 2018-11-12 DIAGNOSIS — K760 Fatty (change of) liver, not elsewhere classified: Secondary | ICD-10-CM | POA: Diagnosis present

## 2018-11-12 DIAGNOSIS — Z9079 Acquired absence of other genital organ(s): Secondary | ICD-10-CM

## 2018-11-12 DIAGNOSIS — Y92009 Unspecified place in unspecified non-institutional (private) residence as the place of occurrence of the external cause: Secondary | ICD-10-CM

## 2018-11-12 DIAGNOSIS — E872 Acidosis: Secondary | ICD-10-CM | POA: Diagnosis present

## 2018-11-12 DIAGNOSIS — R740 Nonspecific elevation of levels of transaminase and lactic acid dehydrogenase [LDH]: Secondary | ICD-10-CM | POA: Diagnosis present

## 2018-11-12 DIAGNOSIS — R7401 Elevation of levels of liver transaminase levels: Secondary | ICD-10-CM

## 2018-11-12 DIAGNOSIS — I10 Essential (primary) hypertension: Secondary | ICD-10-CM | POA: Diagnosis present

## 2018-11-12 DIAGNOSIS — E785 Hyperlipidemia, unspecified: Secondary | ICD-10-CM | POA: Diagnosis present

## 2018-11-12 DIAGNOSIS — I723 Aneurysm of iliac artery: Secondary | ICD-10-CM | POA: Diagnosis present

## 2018-11-12 DIAGNOSIS — W19XXXA Unspecified fall, initial encounter: Secondary | ICD-10-CM

## 2018-11-12 DIAGNOSIS — R569 Unspecified convulsions: Secondary | ICD-10-CM | POA: Diagnosis not present

## 2018-11-12 DIAGNOSIS — E876 Hypokalemia: Secondary | ICD-10-CM | POA: Diagnosis present

## 2018-11-12 DIAGNOSIS — T502X5A Adverse effect of carbonic-anhydrase inhibitors, benzothiadiazides and other diuretics, initial encounter: Secondary | ICD-10-CM | POA: Diagnosis present

## 2018-11-12 DIAGNOSIS — W1830XA Fall on same level, unspecified, initial encounter: Secondary | ICD-10-CM | POA: Diagnosis present

## 2018-11-12 DIAGNOSIS — M6282 Rhabdomyolysis: Secondary | ICD-10-CM | POA: Diagnosis present

## 2018-11-12 DIAGNOSIS — Z8719 Personal history of other diseases of the digestive system: Secondary | ICD-10-CM

## 2018-11-12 DIAGNOSIS — Z79899 Other long term (current) drug therapy: Secondary | ICD-10-CM

## 2018-11-12 DIAGNOSIS — Z7982 Long term (current) use of aspirin: Secondary | ICD-10-CM

## 2018-11-12 DIAGNOSIS — Z9049 Acquired absence of other specified parts of digestive tract: Secondary | ICD-10-CM

## 2018-11-12 DIAGNOSIS — K828 Other specified diseases of gallbladder: Secondary | ICD-10-CM | POA: Diagnosis present

## 2018-11-12 LAB — COMPREHENSIVE METABOLIC PANEL
ALT: 255 U/L — ABNORMAL HIGH (ref 0–44)
AST: 425 U/L — ABNORMAL HIGH (ref 15–41)
Albumin: 2.4 g/dL — ABNORMAL LOW (ref 3.5–5.0)
Alkaline Phosphatase: 105 U/L (ref 38–126)
Anion gap: 14 (ref 5–15)
BILIRUBIN TOTAL: 0.9 mg/dL (ref 0.3–1.2)
BUN: 21 mg/dL (ref 8–23)
CO2: 37 mmol/L — ABNORMAL HIGH (ref 22–32)
Calcium: 7.2 mg/dL — ABNORMAL LOW (ref 8.9–10.3)
Chloride: 85 mmol/L — ABNORMAL LOW (ref 98–111)
Creatinine, Ser: 0.75 mg/dL (ref 0.61–1.24)
GFR calc Af Amer: >60 mL/min (ref >60)
Glucose, Bld: 105 mg/dL — ABNORMAL HIGH (ref 70–99)
Potassium: 2 mmol/L — CL (ref 3.5–5.1)
Sodium: 136 mmol/L (ref 135–145)
TOTAL PROTEIN: 5.8 g/dL — AB (ref 6.5–8.1)

## 2018-11-12 LAB — CK: Total CK: 7546 U/L — ABNORMAL HIGH (ref 49–397)

## 2018-11-12 LAB — URINALYSIS, ROUTINE W REFLEX MICROSCOPIC
Bacteria, UA: NONE SEEN
Bilirubin Urine: NEGATIVE
GLUCOSE, UA: NEGATIVE mg/dL
Ketones, ur: NEGATIVE mg/dL
Leukocytes,Ua: NEGATIVE
Nitrite: NEGATIVE
Protein, ur: 30 mg/dL — AB
Specific Gravity, Urine: 1.017 (ref 1.005–1.030)
pH: 6 (ref 5.0–8.0)

## 2018-11-12 LAB — CBC WITH DIFFERENTIAL/PLATELET
Abs Immature Granulocytes: 0.05 10*3/uL (ref 0.00–0.07)
Basophils Absolute: 0 10*3/uL (ref 0.0–0.1)
Basophils Relative: 0 %
Eosinophils Absolute: 0 10*3/uL (ref 0.0–0.5)
Eosinophils Relative: 0 %
HCT: 36.9 % — ABNORMAL LOW (ref 39.0–52.0)
Hemoglobin: 11.9 g/dL — ABNORMAL LOW (ref 13.0–17.0)
IMMATURE GRANULOCYTES: 1 %
Lymphocytes Relative: 10 %
Lymphs Abs: 0.9 10*3/uL (ref 0.7–4.0)
MCH: 27.5 pg (ref 26.0–34.0)
MCHC: 32.2 g/dL (ref 30.0–36.0)
MCV: 85.2 fL (ref 80.0–100.0)
Monocytes Absolute: 0.6 10*3/uL (ref 0.1–1.0)
Monocytes Relative: 7 %
Neutro Abs: 7.4 10*3/uL (ref 1.7–7.7)
Neutrophils Relative %: 82 %
Platelets: 202 10*3/uL (ref 150–400)
RBC: 4.33 MIL/uL (ref 4.22–5.81)
RDW: 14.5 % (ref 11.5–15.5)
WBC: 9 10*3/uL (ref 4.0–10.5)
nRBC: 0 % (ref 0.0–0.2)

## 2018-11-12 LAB — RAPID URINE DRUG SCREEN, HOSP PERFORMED
Amphetamines: NOT DETECTED
Barbiturates: NOT DETECTED
Benzodiazepines: NOT DETECTED
Cocaine: NOT DETECTED
Opiates: NOT DETECTED
Tetrahydrocannabinol: NOT DETECTED

## 2018-11-12 LAB — ETHANOL: Alcohol, Ethyl (B): <10 mg/dL (ref <10)

## 2018-11-12 LAB — ACETAMINOPHEN LEVEL: Acetaminophen (Tylenol), Serum: <10 ug/mL — ABNORMAL LOW (ref 10–30)

## 2018-11-12 LAB — TROPONIN I
TROPONIN I: 0.03 ng/mL — AB (ref <0.03)
Troponin I: 0.03 ng/mL (ref <0.03)
Troponin I: 0.03 ng/mL (ref <0.03)

## 2018-11-12 LAB — LACTIC ACID, PLASMA
LACTIC ACID, VENOUS: 3.2 mmol/L — AB (ref 0.5–1.9)
Lactic Acid, Venous: 1.3 mmol/L (ref 0.5–1.9)

## 2018-11-12 LAB — MAGNESIUM: Magnesium: 1.4 mg/dL — ABNORMAL LOW (ref 1.7–2.4)

## 2018-11-12 MED ORDER — ONDANSETRON HCL 4 MG PO TABS: 4.0000 mg | ORAL_TABLET | Freq: Four times a day (QID) | ORAL | Status: DC | PRN

## 2018-11-12 MED ORDER — POTASSIUM CHLORIDE 10 MEQ/100ML IV SOLN
10.0000 meq | INTRAVENOUS | Status: AC
  Administered 2018-11-12 (×2): 10 meq via INTRAVENOUS
  Filled 2018-11-12 (×2): qty 100

## 2018-11-12 MED ORDER — SODIUM CHLORIDE 0.9 % IV BOLUS
500.0000 mL | Freq: Once | INTRAVENOUS | Status: AC
  Administered 2018-11-12: 500 mL via INTRAVENOUS

## 2018-11-12 MED ORDER — POTASSIUM CHLORIDE 10 MEQ/100ML IV SOLN
10.0000 meq | INTRAVENOUS | Status: AC
  Administered 2018-11-12: 10 meq via INTRAVENOUS
  Filled 2018-11-12 (×2): qty 100

## 2018-11-12 MED ORDER — LEVETIRACETAM IN NACL 500 MG/100ML IV SOLN
500.0000 mg | Freq: Two times a day (BID) | INTRAVENOUS | Status: DC
  Administered 2018-11-12 – 2018-11-13 (×3): 500 mg via INTRAVENOUS
  Filled 2018-11-12 (×3): qty 100

## 2018-11-12 MED ORDER — ONDANSETRON HCL 4 MG/2ML IJ SOLN
4.0000 mg | Freq: Four times a day (QID) | INTRAMUSCULAR | Status: DC | PRN
  Administered 2018-11-16: 4 mg via INTRAVENOUS
  Filled 2018-11-12: qty 2

## 2018-11-12 MED ORDER — POTASSIUM CHLORIDE CRYS ER 20 MEQ PO TBCR
40.0000 meq | EXTENDED_RELEASE_TABLET | ORAL | Status: AC
  Administered 2018-11-12 – 2018-11-13 (×2): 40 meq via ORAL
  Filled 2018-11-12 (×2): qty 2

## 2018-11-12 MED ORDER — POTASSIUM CHLORIDE IN NACL 40-0.9 MEQ/L-% IV SOLN
INTRAVENOUS | Status: AC
  Administered 2018-11-12 – 2018-11-13 (×2): 100 mL/h via INTRAVENOUS
  Filled 2018-11-12 (×2): qty 1000

## 2018-11-12 MED ORDER — SODIUM CHLORIDE 0.9 % IV BOLUS
1000.0000 mL | Freq: Once | INTRAVENOUS | Status: AC
  Administered 2018-11-12: 1000 mL via INTRAVENOUS

## 2018-11-12 MED ORDER — SODIUM CHLORIDE 0.9 % IV SOLN: INTRAVENOUS | Status: DC

## 2018-11-12 MED ORDER — POTASSIUM CHLORIDE 10 MEQ/100ML IV SOLN: 10.0000 meq | INTRAVENOUS | Status: DC

## 2018-11-12 MED ORDER — MAGNESIUM SULFATE 2 GM/50ML IV SOLN
2.0000 g | Freq: Once | INTRAVENOUS | Status: AC
  Administered 2018-11-12: 2 g via INTRAVENOUS
  Filled 2018-11-12: qty 50

## 2018-11-12 MED ORDER — FAMOTIDINE IN NACL 20-0.9 MG/50ML-% IV SOLN
20.0000 mg | INTRAVENOUS | Status: DC
  Administered 2018-11-12 – 2018-11-13 (×2): 20 mg via INTRAVENOUS
  Filled 2018-11-12 (×2): qty 50

## 2018-11-12 NOTE — ED Notes (Signed)
Date and time results received: 11/12/18 1018  Test: Troponin Critical Value: 0.03  Name of Provider Notified: Dr. Thurnell Garbe  Orders Received? Or Actions Taken?: See chart

## 2018-11-12 NOTE — ED Triage Notes (Signed)
Pt fell from standing position this morning. Pt states his LT leg and LT ankle became weak. Pt states this has been an ongoing problem. Pt currently receiving oral chemo for prostate cancer. AOx4. No LOC.

## 2018-11-12 NOTE — ED Notes (Signed)
Phlebotomy at bedside.

## 2018-11-12 NOTE — H&P (Signed)
History and Physical    Joshua Mckee FAO:[id number] DOB: 03/05/1941 DOA: 11/12/2018  PCP: Dettinger, Fransisca Kaufmann, MD   Patient coming from: Home  I have personally briefly reviewed patient's old medical records in [place]  Chief Complaint: Fall  HPI: Joshua Mckee is a 78 y.o. male with medical history significant for SBO, prostate cancer, GI bleed, alcohol abuse, seizure disorder who was brought to the ED via EMS after he fell and he was unable to get up, significant other could not help getting up either.  He reports his left leg that has been giving him problems just give out.  He denies vomiting or loose stools, reports good p.o. intake.  Is abdominal pain or distention.  Last bowel movement was today.  Reports he has not drank alcohol in over a year.  Recent hospitalization 12/27 to 09/21/2018 for small bowel obstruction, requiring exploratory laparotomy for small bowel resection and lysis of adhesions.  Discharge to home with home health, and has done well from a surgical standpoint.  ED Course: Hypotensive systolic down to 69/62, marked hypokalemia-2, hypomagnesemia- 1.4, lactic acidosis 3.2 >>1.3 after 2 L bolus, negative serum alcohol level.  Troponin 0.03.  Elevated CK 7546.  Imaging included CT abdomen and pelvis without contrast- revealed small bowel obstruction, abrupt caliber change at the anastomosis at the left lower quadrant.  Right upper quadrant ultrasound-mild appearing fatty infiltration of the liver, negative for stones or cholecystitis.  Cervical CT-12 mm sclerotic lesion within the right transverse process /articular facets C7 vertebral body.  May represent bone island versus sclerotic metastatic lesion.  Head CT negative for acute abnormality, two-view chest x-ray lumbar x-ray pelvic x-ray negative for acute abnormality.  Review of Systems: As per HPI all other systems reviewed and negative.  Past Medical History:  Diagnosis Date  . AKI (acute kidney injury) ([place])  09/15/2018  . Aneurysm of right common iliac artery ([facility])    2.2 cm per CT 02-18-2017  . Arthritis   . GERD (gastroesophageal reflux disease)   . Hiatal hernia   . Hiccups    CHRONIC--- MULTIPLE ED VISITS  . History of alcohol abuse   . History of GI bleed    upper GI bleed from esophageal ulcer 01/ 2017 and 03/ 2017 from erosive esophageal gastiris  . 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 History of small bowel obstruction    04-06-2005  s/p  exp. lap. w/ lysis adhesions for partial sbo  . Hyperlipidemia   . Hyperlipidemia   . Hyperplasia of prostate with lower urinary tract symptoms (LUTS)   . Hypertension   . LAFB (left anterior fascicular block)   . Mild cognitive impairment with memory loss    PROBABLE STROKE RESIDUAL BUT POSSIBLE PRIOR TO STROKE  . Partial seizure ([place])    x1  10-15-2016 at ED visit , post cva 08-27-2016  . Poor historian   . Prostate cancer [facility]) urologist-  dr Jeffie Pollock  . Schatzki's ring   . Stroke ([place])   . Substance abuse [facility])     Past Surgical History:  Procedure Laterality Date  . BOWEL RESECTION N/A 09/16/2018   Procedure: PARTIAL SMALL BOWEL RESECTION;  Surgeon: Aviva Signs, MD;  Location: [facility];  Service: General;  Laterality: N/A;  . COLONOSCOPY N/A 03/08/2016   Procedure: COLONOSCOPY;  Surgeon:  Daneil Dolin, MD;  Location: [facility];  Service: Endoscopy;  Laterality: N/A;  1300-moved to 1400  . ESOPHAGOGASTRODUODENOSCOPY N/A 10/05/2015   Dr. Gala Romney: erosive reflux esophagitis, non-critcal Schatki's ring non-manipulated  . ESOPHAGOGASTRODUODENOSCOPY N/A 12/15/2015   Dr. Rourk:non bleeding esophageal ulcer/small HH  . ESOPHAGOGASTRODUODENOSCOPY N/A 03/08/2016   Procedure: ESOPHAGOGASTRODUODENOSCOPY (EGD);  Surgeon: Daneil Dolin, MD;  Location: [facility];   Service: Endoscopy;  Laterality: N/A;  . EXPLORATORY LAPAROTOMY W/ ADHESIOLYSIS  04/06/2005   partial SBO  . LAPAROTOMY N/A 09/16/2018   Procedure: EXPLORATORY LAPAROTOMY;  Surgeon: Aviva Signs, MD;  Location: [facility];  Service: General;  Laterality: N/A;  . LYSIS OF ADHESION N/A 09/16/2018   Procedure: LYSIS OF ADHESION;  Surgeon: Aviva Signs, MD;  Location: [facility];  Service: General;  Laterality: N/A;  . ORCHIECTOMY Bilateral 03/05/2017   Procedure: ORCHIECTOMY BILATERAL;  Surgeon: Irine Seal, MD;  Location: [facility];  Service: Urology;  Laterality: Bilateral;  . PROSTATE BIOPSY N/A 03/05/2017   Procedure: BIOPSY TRANSRECTAL ULTRASONIC PROSTATE (TUBP);  Surgeon: Irine Seal, MD;  Location[facility];  Service: Urology;  Laterality: N/A;  . TRANSTHORACIC ECHOCARDIOGRAM  08/28/2016   mild to moderate LVH, ef 55-60%/  mild AR and TR/  trivial MR     reports that he has never smoked. He has never used smokeless tobacco. He reports that he does not drink alcohol or use drugs.  No Known Allergies  Family History  Problem Relation Age of Onset  . Colon cancer Neg Hx     Prior to Admission medications   Medication Sig Start Date End Date Taking? Authorizing Provider  aspirin 325 MG tablet Take 1 tablet (325 mg total) by mouth daily. 08/30/18  Yes Patrecia Pour, MD  atorvastatin (LIPITOR) 80 MG tablet Take 1 tablet (80 mg total) by mouth daily. TAKE ONE TABLET BY MOUTH DAILY AT 6 PM. 11/03/18  Yes Dettinger, Fransisca Kaufmann, MD  hydrochlorothiazide (HYDRODIURIL) 25 MG tablet Take 1 tablet (25 mg total) by mouth daily. For blood pressure 06/02/18  Yes Dettinger, Fransisca Kaufmann, MD  levETIRAcetam (KEPPRA) 500 MG tablet TAKE ONE TABLET BY MOUTH 2 TIMES A DAY. 06/02/18  Yes Dettinger, Fransisca Kaufmann, MD  pantoprazole (PROTONIX) 40 MG tablet TAKE (1) TABLET BY MOUTH ONCE DAILY. 06/02/18  Yes Dettinger, Fransisca Kaufmann, MD  tamsulosin (FLOMAX) 0.4 MG CAPS capsule Take 1 capsule (0.4 mg  total) by mouth daily. 06/02/18  Yes Dettinger, Fransisca Kaufmann, MD  bisacodyl (DULCOLAX) 10 MG suppository Place 1 suppository (10 mg total) rectally daily as needed for moderate constipation. Patient not taking: Reported on 11/12/2018 09/21/18   Virl Cagey, MD  magnesium hydroxide (MILK OF MAGNESIA) 400 MG/5ML suspension Take 30 mLs by mouth daily as needed for mild constipation. Patient not taking: Reported on 11/12/2018 09/21/18   Virl Cagey, MD  ondansetron (ZOFRAN-ODT) 4 MG disintegrating tablet Take 1 tablet (4 mg total) by mouth every 6 (six) hours as needed for nausea. Patient not taking: Reported on 11/12/2018 09/21/18   Virl Cagey, MD    Physical Exam: Vitals:   11/12/18 1315 11/12/18 1330 11/12/18 1400 11/12/18 1500  BP:  97/73 105/71 94/67  Pulse: 78 74 75 72  Resp: 15  17 18   Temp:      TempSrc:      SpO2: 97% 98% 98% 99%  Weight:      Height:        Constitutional: NAD, calm,  comfortable Vitals:   11/12/18 1315 11/12/18 1330 11/12/18 1400 11/12/18 1500  BP:  97/73 105/71 94/67  Pulse: 78 74 75 72  Resp: 15  17 18   Temp:      TempSrc:      SpO2: 97% 98% 98% 99%  Weight:      Height:       Eyes: PERRL, lids and conjunctivae normal ENMT: Mucous membranes are moist. Posterior pharynx clear of any exudate or lesions. Neck: normal, supple, no masses, no thyromegaly Respiratory: clear to auscultation bilaterally, no wheezing, no crackles. Normal respiratory effort. No accessory muscle use.  Cardiovascular: Regular rate and rhythm, no murmurs / rubs / gallops. No extremity edema. 2+ pedal pulses. No carotid bruits.  Abdomen: no tenderness, no abdominal distention, well healed surgical incisions, no masses palpated. No hepatosplenomegaly. Musculoskeletal: no clubbing / cyanosis. No joint deformity upper and lower extremities. Good ROM, no contractures. Normal muscle tone.  Skin: no rashes, lesions, ulcers. No induration Neurologic: CN 2-12 grossly intact.   Strength 5/5 in upper extremities and 4/5 in bilateral lower extremities.  Psychiatric: Normal judgment and insight. Alert and oriented x 3. Normal mood.   Labs on Admission: I have personally reviewed following labs and imaging studies  CBC: Recent Labs  Lab 11/12/18 0904  WBC 9.0  NEUTROABS 7.4  HGB 11.9*  HCT 36.9*  MCV 85.2  PLT 989   Basic Metabolic Panel: Recent Labs  Lab 11/12/18 0904  NA 136  K 2.0*  CL 85*  CO2 37*  GLUCOSE 105*  BUN 21  CREATININE 0.75  CALCIUM 7.2*  MG 1.4*   Liver Function Tests: Recent Labs  Lab 11/12/18 0904  AST 425*  ALT 255*  ALKPHOS 105  BILITOT 0.9  PROT 5.8*  ALBUMIN 2.4*   Cardiac Enzymes: Recent Labs  Lab 11/12/18 0904  CKTOTAL 7,546*  TROPONINI 0.03*    Radiological Exams on Admission: Ct Abdomen Pelvis Wo Contrast  Result Date: 11/12/2018 CLINICAL DATA:  Fall, evaluate possible small bowel obstruction EXAM: CT ABDOMEN AND PELVIS WITHOUT CONTRAST TECHNIQUE: Multidetector CT imaging of the abdomen and pelvis was performed following the standard protocol without IV contrast. COMPARISON:  CT abdomen pelvis, 09/15/2018 FINDINGS: Lower chest: Bibasilar scarring and irregular opacity, improved compared to prior examination. Hepatobiliary: Numerous small fluid attenuation liver lesions. No gallstones, gallbladder wall thickening, or biliary dilatation. Pancreas: Unremarkable. No pancreatic ductal dilatation or surrounding inflammatory changes. Spleen: Normal in size without focal abnormality. Adrenals/Urinary Tract: Adrenal glands are unremarkable. Kidneys are normal, without renal calculi, focal lesion, or hydronephrosis. Bladder is unremarkable. Stomach/Bowel: Stomach is within normal limits. There is diffuse dilatation of the proximal to mid small bowel, with interval findings of a distal small bowel resection. There appears to be an abrupt caliber change at the anastomosis, in the left lower quadrant (series 2, image 64).  Largest loops of bowel measure 3.8 cm. There is gas and stool present in the colon to the rectum. Vascular/Lymphatic: Calcific atherosclerosis. No enlarged abdominal or pelvic lymph nodes. Reproductive: No mass or other abnormality. Other: No abdominal wall hernia or abnormality. No abdominopelvic ascites. Musculoskeletal: No acute or significant osseous findings. IMPRESSION: 1. There is diffuse dilatation of the proximal to mid small bowel, with interval findings of a distal small bowel resection. There appears to be an abrupt caliber change at the anastomosis, in the left lower quadrant (series 2, image 64). Largest loops of bowel measure 3.8 cm. There is gas and stool present in the colon to  the rectum. 2.  Other chronic and incidental findings as detailed above. Electronically Signed   By: Eddie Candle M.D.   On: 11/12/2018 15:00   Dg Chest 2 View  Result Date: 11/12/2018 CLINICAL DATA:  Fall at home today.  Weakness. EXAM: CHEST - 2 VIEW COMPARISON:  09/13/2018 FINDINGS: The heart size and mediastinal contours are within normal limits. Both lungs are clear. The visualized skeletal structures are unremarkable. IMPRESSION: No active cardiopulmonary disease. Electronically Signed   By: Earle Gell M.D.   On: 11/12/2018 10:19   Dg Lumbar Spine Complete  Result Date: 11/12/2018 CLINICAL DATA:  Status post fall with leg pain. EXAM: LUMBAR SPINE - COMPLETE 4+ VIEW COMPARISON:  Body CT 09/15/2018 FINDINGS: There is no evidence of lumbar spine fracture. Alignment is normal. Multilevel osteoarthritic changes. Dilated small bowel loops measure up to 6.2 cm. IMPRESSION: Dilated small bowel loops measure up to 6.2 cm, consistent with small bowel obstruction. No evidence of lumbosacral spine fracture. Electronically Signed   By: Fidela Salisbury M.D.   On: 11/12/2018 10:24   Ct Head Wo Contrast  Result Date: 11/12/2018 CLINICAL DATA:  Patient fell from standing position with left leg weakness. EXAM: CT HEAD  WITHOUT CONTRAST CT CERVICAL SPINE WITHOUT CONTRAST TECHNIQUE: Multidetector CT imaging of the head and cervical spine was performed following the standard protocol without intravenous contrast. Multiplanar CT image reconstructions of the cervical spine were also generated. COMPARISON:  Brain MRI 08/28/2018 FINDINGS: CT HEAD FINDINGS Brain: No evidence of acute infarction, hemorrhage, hydrocephalus, extra-axial collection or mass lesion/mass effect. Lacunar infarct in the right pre central gyrus demonstrated by MRI is not well seen by CT. Remote right thalamic ischemic infarct. Stable brain parenchymal volume loss with ventriculomegaly and mild deep white matter microangiopathy. Vascular: Calcific atherosclerotic disease. Skull: Normal. Negative for fracture or focal lesion. Sinuses/Orbits: No acute finding. Other: None. CT CERVICAL SPINE FINDINGS Alignment: Reversal of cervical lordosis. Skull base and vertebrae: No acute fracture. 12 mm sclerotic lesion within the right transverse process/articular facet of C7 vertebral body Soft tissues and spinal canal: No prevertebral fluid or swelling. No visible canal hematoma. Small amount of gas within the spinal canal/left neural foramen at L3-L4, likely degenerative. Disc levels:  Multilevel osteoarthritic changes. Upper chest: Negative. Other: None. IMPRESSION: 1. No acute intracranial abnormality. 2. Atrophy, chronic microvascular disease. 3. Lacunar infarct in the right pre central gyrus demonstrated by MRI is not well seen by CT. 4. No evidence of acute traumatic injury to the cervical spine. 5. 12 mm sclerotic lesion within the right transverse process/articular facet of C7 vertebral body. This may represent a bone island versus sclerotic metastatic lesion. Electronically Signed   By: Fidela Salisbury M.D.   On: 11/12/2018 10:38   Ct Cervical Spine Wo Contrast  Result Date: 11/12/2018 CLINICAL DATA:  Patient fell from standing position with left leg weakness.  EXAM: CT HEAD WITHOUT CONTRAST CT CERVICAL SPINE WITHOUT CONTRAST TECHNIQUE: Multidetector CT imaging of the head and cervical spine was performed following the standard protocol without intravenous contrast. Multiplanar CT image reconstructions of the cervical spine were also generated. COMPARISON:  Brain MRI 08/28/2018 FINDINGS: CT HEAD FINDINGS Brain: No evidence of acute infarction, hemorrhage, hydrocephalus, extra-axial collection or mass lesion/mass effect. Lacunar infarct in the right pre central gyrus demonstrated by MRI is not well seen by CT. Remote right thalamic ischemic infarct. Stable brain parenchymal volume loss with ventriculomegaly and mild deep white matter microangiopathy. Vascular: Calcific atherosclerotic disease. Skull: Normal. Negative for  fracture or focal lesion. Sinuses/Orbits: No acute finding. Other: None. CT CERVICAL SPINE FINDINGS Alignment: Reversal of cervical lordosis. Skull base and vertebrae: No acute fracture. 12 mm sclerotic lesion within the right transverse process/articular facet of C7 vertebral body Soft tissues and spinal canal: No prevertebral fluid or swelling. No visible canal hematoma. Small amount of gas within the spinal canal/left neural foramen at L3-L4, likely degenerative. Disc levels:  Multilevel osteoarthritic changes. Upper chest: Negative. Other: None. IMPRESSION: 1. No acute intracranial abnormality. 2. Atrophy, chronic microvascular disease. 3. Lacunar infarct in the right pre central gyrus demonstrated by MRI is not well seen by CT. 4. No evidence of acute traumatic injury to the cervical spine. 5. 12 mm sclerotic lesion within the right transverse process/articular facet of C7 vertebral body. This may represent a bone island versus sclerotic metastatic lesion. Electronically Signed   By: Fidela Salisbury M.D.   On: 11/12/2018 10:38   US Abdomen Limited  Result Date: 11/12/2018 CLINICAL DATA:  Elevated liver function tests. EXAM: ULTRASOUND ABDOMEN  LIMITED RIGHT UPPER QUADRANT COMPARISON:  CT abdomen and pelvis 09/15/2018. FINDINGS: Gallbladder: The gallbladder is incompletely distended. There is some sludge within the gallbladder. No pericholecystic fluid. Sonographer reports negative Murphy's sign. No wall thickening. Common bile duct: Diameter: 0.4 cm Liver: Multiple up hepatic cysts are identified as seen on prior CT scan. Echogenicity is mildly increased. Portal vein is patent on color Doppler imaging with normal direction of blood flow towards the liver. IMPRESSION: Mild appearing fatty infiltration of the liver. Small volume of gallbladder sludge. Negative for stones or cholecystitis. Multiple hepatic cysts. Electronically Signed   By: Inge Rise M.D.   On: 11/12/2018 11:34   Dg Hips Bilat W Or Wo Pelvis 5 Views  Result Date: 11/12/2018 CLINICAL DATA:  Status post fall with leg pain. EXAM: DG HIP (WITH OR WITHOUT PELVIS) 5+V BILAT COMPARISON:  None. FINDINGS: There is no evidence of hip fracture or dislocation. There is no evidence of arthropathy or other focal bone abnormality. IMPRESSION: Negative. Electronically Signed   By: Fidela Salisbury M.D.   On: 11/12/2018 10:20    EKG: Independently reviewed. Artifacts.   Assessment/Plan Active Problems:   Hypokalemia   Fall-chronic left ankle problems. -PT Evaluation  Hypokalemia- 2, hypomagnesemia- 1.4-likely secondary to HCTZ.  Denies alcohol intake.  Negative alcohol level. -Replete - Monitor K - BMP., mag a.m  Rhabdomyolysis- 7546, he reports downtime was only 10 to 15 minutes. -CK a.m. -Hydrate N/s + 40KCL 100cc/hr x 15hrs  Mild troponin elevation- 0.03.  No chest pain.  EKG with artifacts but no appreciable ST or T wave abnormalities. -Repeat EKG -Trend troponin- flat.   Small bowel obstruction-incidental finding.  Patient is asymptomatic.  Bowel movement today. GI consulted in ED. - F/u general surgery recommendations in a.m. -N.p.o. for now, IV fluids -Failed  NG tube as he is no vomiting and abdomen is not distended  Lactic acidosis- 3.2 > 1.3, with IV fluids.  Likely 2/2 Dehydration, rhabdo. - Hydrate  C7 vertebral lesion- incidental finding, on cervical CT, with history of prostate cancer.  Elevated liver enzymes-AST 425, ALT 255, normal ALP, normal total bili. Pattern consistent with alcoholic liver disease.  Abdominal CT and right upper quadrant ultrasound-negative for stones or cholecystitis, multiple hepatic cysts.,  Ultrasound shows small volume of gallbladder sludge, fatty infiltration of the liver. -CMP a.m. -Hepatitis panel  Prostate cancer- -Hold home tamsulosin while NPO - Follow-up with urology  Alcohol abuse-denies alcohol intake in the past  year.  Transaminitis in alcoholic liver disease pattern.  Seizures- No seizures in several years.  Reports compliance with Keppra -Switch p.o. Keppra to IV while n.p.o.  DVT prophylaxis: Scds pending surg eval Code Status: Full Family Communication: None at bedside Disposition Plan: Per rounding team Consults called: Gen Surg- Dr. Arnoldo Morale Admission status: Inpt, Step down for hypokalemia    Bethena Roys MD [facility]  11/12/2018, 3:39 PM

## 2018-11-12 NOTE — ED Provider Notes (Signed)
[facility] EMERGENCY DEPARTMENT Provider Note   CSN: [id number] Arrival date & time: 11/12/18  2355    History   Chief Complaint Chief Complaint  Patient presents with  . Fall    HPI Joshua Mckee is a 78 y.o. male.     HPI Pt was seen at [place]. Per EMS and pt report: c/o sudden onset and resolution of one episode of fall that occurred this morning PTA. Pt states he got up and walked to the bathroom "a little after 6" this morning. States he finished using the bathroom and was walking back into the living room when he "just fell and couldn't get up." Pt states his "legs gave out." States both of his legs are "weak." States he walks with a walker at baseline. Denies LOC, no AMS, no neck or back pain, no abd pain, no N/V/D, no CP/SOB, no fevers, no tinging/numbness in extremities.    Past Medical History:  Diagnosis Date  . AKI (acute kidney injury) ([place]) 09/15/2018  . Aneurysm of right common iliac artery (HCC)    2.2 cm per CT 02-18-2017  . Arthritis   . GERD (gastroesophageal reflux disease)   . Hiatal hernia   . Hiccups    CHRONIC--- MULTIPLE ED VISITS  . History of alcohol abuse   . History of GI bleed    upper GI bleed from esophageal ulcer 01/ 2017 and 03/ 2017 from erosive esophageal gastiris  . 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 History of small bowel obstruction    04-06-2005  s/p  exp. lap. w/ lysis adhesions for partial sbo  . Hyperlipidemia   . Hyperlipidemia   . Hyperplasia of prostate with lower urinary tract symptoms (LUTS)   . Hypertension   . LAFB (left anterior fascicular block)   . Mild cognitive impairment with memory loss    PROBABLE STROKE RESIDUAL BUT POSSIBLE PRIOR TO STROKE  . Partial seizure ([place])    x1  10-15-2016 at ED visit , post cva 08-27-2016  . Poor  historian   . Prostate cancer [facility]) urologist-  dr Jeffie Pollock  . Schatzki's ring   . Stroke ([place])   . Substance abuse [facility])     Patient Active Problem List   Diagnosis Date Noted  . Small bowel obstruction due to adhesions ([place])   . AKI (acute kidney injury) ([place]) 09/15/2018  . SBO (small bowel obstruction) ([place]) 09/12/2018  . High transaminase levels 09/12/2018  . History of CVA (cerebrovascular accident) 08/28/2018  . Weakness 08/28/2018  . Overweight (BMI 25.0-29.9) 06/02/2018  . Alcoholism in remission ([place]) 06/13/2017  . Prostate cancer ([place]) 03/06/2017  . Hyperlipidemia 11/30/2016  . Seizure ([place]) 10/15/2016  . History of recent stroke 08/27/2016  . Hypertension 08/27/2016  . Esophageal ulcer 02/27/2016  . GI bleed 12/15/2015  . Reflux esophagitis   . Hiatal hernia     Past Surgical History:  Procedure Laterality Date  . BOWEL RESECTION N/A 09/16/2018   Procedure: PARTIAL SMALL BOWEL RESECTION;  Surgeon: Aviva Signs, MD;  Location: [facility] ORS;  Service: General;  Laterality: N/A;  . COLONOSCOPY N/A 03/08/2016   Procedure: COLONOSCOPY;  Surgeon: Daneil Dolin, MD;  Location: [facility] ENDO SUITE;  Service: Endoscopy;  Laterality: N/A;  1300-moved to 1400  . ESOPHAGOGASTRODUODENOSCOPY N/A 10/05/2015   Dr. Gala Romney: erosive reflux esophagitis,  non-critcal Schatki's ring non-manipulated  . ESOPHAGOGASTRODUODENOSCOPY N/A 12/15/2015   Dr. Rourk:non bleeding esophageal ulcer/small HH  . ESOPHAGOGASTRODUODENOSCOPY N/A 03/08/2016   Procedure: ESOPHAGOGASTRODUODENOSCOPY (EGD);  Surgeon: Daneil Dolin, MD;  Location: [facility] ENDO SUITE;  Service: Endoscopy;  Laterality: N/A;  . EXPLORATORY LAPAROTOMY W/ ADHESIOLYSIS  04/06/2005   partial SBO  . LAPAROTOMY N/A 09/16/2018   Procedure: EXPLORATORY LAPAROTOMY;  Surgeon: Aviva Signs, MD;  Location: [facility] ORS;  Service: General;  Laterality: N/A;  . LYSIS OF ADHESION N/A 09/16/2018   Procedure: LYSIS OF ADHESION;  Surgeon: Aviva Signs, MD;  Location: [facility]  ORS;  Service: General;  Laterality: N/A;  . ORCHIECTOMY Bilateral 03/05/2017   Procedure: ORCHIECTOMY BILATERAL;  Surgeon: Irine Seal, MD;  Location: [facility];  Service: Urology;  Laterality: Bilateral;  . PROSTATE BIOPSY N/A 03/05/2017   Procedure: BIOPSY TRANSRECTAL ULTRASONIC PROSTATE (TUBP);  Surgeon: Irine Seal, MD;  [facility];  Service: Urology;  Laterality: N/A;  . TRANSTHORACIC ECHOCARDIOGRAM  08/28/2016   mild to moderate LVH, ef 55-60%/  mild AR and TR/  trivial MR        Home Medications    Prior to Admission medications   Medication Sig Start Date End Date Taking? Authorizing Provider  aspirin 325 MG tablet Take 1 tablet (325 mg total) by mouth daily. 08/30/18   Patrecia Pour, MD  atorvastatin (LIPITOR) 80 MG tablet Take 1 tablet (80 mg total) by mouth daily. TAKE ONE TABLET BY MOUTH DAILY AT 6 PM. 11/03/18   Dettinger, Fransisca Kaufmann, MD  bisacodyl (DULCOLAX) 10 MG suppository Place 1 suppository (10 mg total) rectally daily as needed for moderate constipation. 09/21/18   Virl Cagey, MD  hydrochlorothiazide (HYDRODIURIL) 25 MG tablet Take 1 tablet (25 mg total) by mouth daily. For blood pressure 06/02/18   Dettinger, Fransisca Kaufmann, MD  levETIRAcetam (KEPPRA) 500 MG tablet TAKE ONE TABLET BY MOUTH 2 TIMES A DAY. 06/02/18   Dettinger, Fransisca Kaufmann, MD  magnesium hydroxide (MILK OF MAGNESIA) 400 MG/5ML suspension Take 30 mLs by mouth daily as needed for mild constipation. 09/21/18   Virl Cagey, MD  ondansetron (ZOFRAN-ODT) 4 MG disintegrating tablet Take 1 tablet (4 mg total) by mouth every 6 (six) hours as needed for nausea. 09/21/18   Virl Cagey, MD  pantoprazole (PROTONIX) 40 MG tablet TAKE (1) TABLET BY MOUTH ONCE DAILY. 06/02/18   Dettinger, Fransisca Kaufmann, MD  tamsulosin (FLOMAX) 0.4 MG CAPS capsule Take 1 capsule (0.4 mg total) by mouth daily. 06/02/18   Dettinger, Fransisca Kaufmann, MD  traMADol (ULTRAM) 50 MG tablet Take 1 tablet (50 mg total)  by mouth every 6 (six) hours as needed for moderate pain or severe pain. 09/21/18   Virl Cagey, MD    Family History Family History  Problem Relation Age of Onset  . Colon cancer Neg Hx     Social History Social History   Tobacco Use  . Smoking status: Never Smoker  . Smokeless tobacco: Never Used  Substance Use Topics  . Alcohol use: No    Comment: hx alcohol abuse -- last drink 12/ 2017   . Drug use: No     Allergies   Patient has no known allergies.   Review of Systems Review of Systems ROS: Statement: All systems negative except as marked or noted in the HPI; Constitutional: Negative for fever and chills. ; ; Eyes: Negative for eye pain, redness and discharge. ; ; ENMT: Negative for  ear pain, hoarseness, nasal congestion, sinus pressure and sore throat. ; ; Cardiovascular: Negative for chest pain, palpitations, diaphoresis, dyspnea and peripheral edema. ; ; Respiratory: Negative for cough, wheezing and stridor. ; ; Gastrointestinal: Negative for nausea, vomiting, diarrhea, abdominal pain, blood in stool, hematemesis, jaundice and rectal bleeding. . ; ; Genitourinary: Negative for dysuria, flank pain and hematuria. ; ; Musculoskeletal: Negative for back pain and neck pain. Negative for swelling and trauma.; ; Skin: Negative for pruritus, rash, abrasions, blisters, bruising and skin lesion.; ; Neuro: +legs weakness, fall. Negative for headache, lightheadedness and neck stiffness. Negative for altered level of consciousness, altered mental status, paresthesias, involuntary movement, seizure and syncope.       Physical Exam Updated Vital Signs BP 103/71   Pulse 91   Temp 98.6 F (37 C) (Oral)   Resp 16   Ht 5\' 5"  (1.651 m)   Wt 73.9 kg   SpO2 98%   BMI 27.12 kg/m    Patient Vitals for the past 24 hrs:  BP Temp Temp src Pulse Resp SpO2 Height Weight  11/12/18 1500 94/67 - - 72 18 99 % - -  11/12/18 1400 105/71 - - 75 17 98 % - -  11/12/18 1330 97/73 - - 74 - 98  % - -  11/12/18 1315 - - - 78 15 97 % - -  11/12/18 1300 95/79 - - 75 - 96 % - -  11/12/18 1245 - - - 77 16 95 % - -  11/12/18 1230 105/81 - - 74 15 99 % - -  11/12/18 1200 95/64 - - 80 12 97 % - -  11/12/18 1107 96/67 - - 83 (!) 22 94 % - -  11/12/18 1030 94/63 - - 73 17 98 % - -  11/12/18 1015 - - - 73 - 96 % - -  11/12/18 1000 99/74 - - - - - - -  11/12/18 0930 103/71 - - - 16 - - -  11/12/18 0915 - - - - (!) 25 - - -  11/12/18 0900 (!) 116/97 - - - - - - -  11/12/18 0845 - - - 91 - 98 % - -  11/12/18 0831 - - - - - - 5\' 5"  (1.651 m) 73.9 kg  11/12/18 0830 94/71 98.6 F (37 C) Oral 85 16 99 % - -     09:11 Orthostatic Vital Signs VP  Orthostatic Lying   BP- Lying: 85/64  Abnormal   Pulse- Lying: 84      Orthostatic Sitting  BP- Sitting: 95/64  Pulse- Sitting: 87    Physical Exam 0845: Physical examination:  Nursing notes reviewed; Vital signs and O2 SAT reviewed;  Constitutional:  Thin, frail. In no acute distress; Head:  Normocephalic, atraumatic; Eyes: EOMI, PERRL, No scleral icterus; ENMT: Mouth and pharynx normal, Mucous membranes dry; Neck: Supple, Full range of motion, No lymphadenopathy; Cardiovascular: Regular rate and rhythm, No gallop; Respiratory: Breath sounds clear & equal bilaterally, No wheezes.  Speaking full sentences with ease, Normal respiratory effort/excursion; Chest: Nontender, Movement normal; Abdomen: Soft, Nontender, Nondistended, Normal bowel sounds; Genitourinary: No CVA tenderness; Spine:  No midline CS, TS, LS tenderness.;; Extremities: Peripheral pulses normal, Pelvis stable. NT bilat hips/knees/ankles/feet. No deformity, No edema, No calf edema or asymmetry.; Neuro: AA&Ox3, tangential historian. Major CN grossly intact. No facial droop. Speech clear. Grips equal. Strength 5/5 bilat UE's. Strength 3/5 bilat LE's. No gross focal motor or sensory deficits in extremities.; Skin: Color normal,  Warm, Dry.   ED Treatments / Results  Labs (all labs  ordered are listed, but only abnormal results are displayed)   EKG EKG Interpretation  Date/Time:  Wednesday November 12 2018 09:08:44 EST Ventricular Rate:  85 PR Interval:    QRS Duration: 130 QT Interval:  382 QTC Calculation: 455 R Axis:   -48 Text Interpretation:  Poor data quality Normal sinus rhythm Left bundle branch block Baseline wander Artifact When compared with ECG of 08/28/2018 Artifact is now Present no apparent acute changes Confirmed by Francine Graven [phone]) on 11/12/2018 9:22:59 AM   Radiology      Procedures Procedures (including critical care time)  Medications Ordered in ED Medications  0.9 %  sodium chloride infusion (has no administration in time range)  sodium chloride 0.9 % bolus 500 mL (500 mLs Intravenous New Bag/Given 11/12/18 0924)     Initial Impression / Assessment and Plan / ED Course  I have reviewed the triage vital signs and the nursing notes.  Pertinent labs & imaging results that were available during my care of the patient were reviewed by me and considered in my medical decision making (see chart for details).     MDM Reviewed: previous chart, nursing note and vitals Reviewed previous: labs and ECG Interpretation: labs, ECG, x-ray and CT scan Total time providing critical care: 30-74 minutes. This excludes time spent performing separately reportable procedures and services. Consults: admitting MD and general surgery    CRITICAL CARE Performed by: Francine Graven Total critical care time: 55 minutes Critical care time was exclusive of separately billable procedures and treating other patients. Critical care was necessary to treat or prevent imminent or life-threatening deterioration. Critical care was time spent personally by me on the following activities: development of treatment plan with patient and/or surrogate as well as nursing, discussions with consultants, evaluation of patient's response to treatment, examination  of patient, obtaining history from patient or surrogate, ordering and performing treatments and interventions, ordering and review of laboratory studies, ordering and review of radiographic studies, pulse oximetry and re-evaluation of patient's condition.   Results for orders placed or performed during the hospital encounter of 11/12/18  Urinalysis, Routine w reflex microscopic  Result Value Ref Range   Color, Urine YELLOW YELLOW   APPearance CLEAR CLEAR   Specific Gravity, Urine 1.017 1.005 - 1.030   pH 6.0 5.0 - 8.0   Glucose, UA NEGATIVE NEGATIVE mg/dL   Hgb urine dipstick LARGE (A) NEGATIVE   Bilirubin Urine NEGATIVE NEGATIVE   Ketones, ur NEGATIVE NEGATIVE mg/dL   Protein, ur 30 (A) NEGATIVE mg/dL   Nitrite NEGATIVE NEGATIVE   Leukocytes,Ua NEGATIVE NEGATIVE   RBC / HPF 0-5 0 - 5 RBC/hpf   WBC, UA 0-5 0 - 5 WBC/hpf   Bacteria, UA NONE SEEN NONE SEEN   Squamous Epithelial / LPF 0-5 0 - 5   Mucus PRESENT   Comprehensive metabolic panel  Result Value Ref Range   Sodium 136 135 - 145 mmol/L   Potassium 2.0 (LL) 3.5 - 5.1 mmol/L   Chloride 85 (L) 98 - 111 mmol/L   CO2 37 (H) 22 - 32 mmol/L   Glucose, Bld 105 (H) 70 - 99 mg/dL   BUN 21 8 - 23 mg/dL   Creatinine, Ser 0.75 0.61 - 1.24 mg/dL   Calcium 7.2 (L) 8.9 - 10.3 mg/dL   Total Protein 5.8 (L) 6.5 - 8.1 g/dL   Albumin 2.4 (L) 3.5 - 5.0 g/dL   AST 425 (  H) 15 - 41 U/L   ALT 255 (H) 0 - 44 U/L   Alkaline Phosphatase 105 38 - 126 U/L   Total Bilirubin 0.9 0.3 - 1.2 mg/dL   GFR calc non Af Amer >60 >60 mL/min   GFR calc Af Amer >60 >60 mL/min   Anion gap 14 5 - 15  Troponin I - Once  Result Value Ref Range   Troponin I 0.03 (HH) <0.03 ng/mL  CBC with Differential  Result Value Ref Range   WBC 9.0 4.0 - 10.5 K/uL   RBC 4.33 4.22 - 5.81 MIL/uL   Hemoglobin 11.9 (L) 13.0 - 17.0 g/dL   HCT 36.9 (L) 39.0 - 52.0 %   MCV 85.2 80.0 - 100.0 fL   MCH 27.5 26.0 - 34.0 pg   MCHC 32.2 30.0 - 36.0 g/dL   RDW 14.5 11.5 - 15.5 %    Platelets 202 150 - 400 K/uL   nRBC 0.0 0.0 - 0.2 %   Neutrophils Relative % 82 %   Neutro Abs 7.4 1.7 - 7.7 K/uL   Lymphocytes Relative 10 %   Lymphs Abs 0.9 0.7 - 4.0 K/uL   Monocytes Relative 7 %   Monocytes Absolute 0.6 0.1 - 1.0 K/uL   Eosinophils Relative 0 %   Eosinophils Absolute 0.0 0.0 - 0.5 K/uL   Basophils Relative 0 %   Basophils Absolute 0.0 0.0 - 0.1 K/uL   Immature Granulocytes 1 %   Abs Immature Granulocytes 0.05 0.00 - 0.07 K/uL  Lactic acid, plasma  Result Value Ref Range   Lactic Acid, Venous 3.2 (HH) 0.5 - 1.9 mmol/L  Lactic acid, plasma  Result Value Ref Range   Lactic Acid, Venous 1.3 0.5 - 1.9 mmol/L  CK  Result Value Ref Range   Total CK 7,546 (H) 49 - 397 U/L  Acetaminophen level  Result Value Ref Range   Acetaminophen (Tylenol), Serum <10 (L) 10 - 30 ug/mL  Ethanol  Result Value Ref Range   Alcohol, Ethyl (B) <10 <10 mg/dL  Magnesium  Result Value Ref Range   Magnesium 1.4 (L) 1.7 - 2.4 mg/dL   Ct Abdomen Pelvis Wo Contrast Result Date: 11/12/2018 CLINICAL DATA:  Fall, evaluate possible small bowel obstruction EXAM: CT ABDOMEN AND PELVIS WITHOUT CONTRAST TECHNIQUE: Multidetector CT imaging of the abdomen and pelvis was performed following the standard protocol without IV contrast. COMPARISON:  CT abdomen pelvis, 09/15/2018 FINDINGS: Lower chest: Bibasilar scarring and irregular opacity, improved compared to prior examination. Hepatobiliary: Numerous small fluid attenuation liver lesions. No gallstones, gallbladder wall thickening, or biliary dilatation. Pancreas: Unremarkable. No pancreatic ductal dilatation or surrounding inflammatory changes. Spleen: Normal in size without focal abnormality. Adrenals/Urinary Tract: Adrenal glands are unremarkable. Kidneys are normal, without renal calculi, focal lesion, or hydronephrosis. Bladder is unremarkable. Stomach/Bowel: Stomach is within normal limits. There is diffuse dilatation of the proximal to mid small  bowel, with interval findings of a distal small bowel resection. There appears to be an abrupt caliber change at the anastomosis, in the left lower quadrant (series 2, image 64). Largest loops of bowel measure 3.8 cm. There is gas and stool present in the colon to the rectum. Vascular/Lymphatic: Calcific atherosclerosis. No enlarged abdominal or pelvic lymph nodes. Reproductive: No mass or other abnormality. Other: No abdominal wall hernia or abnormality. No abdominopelvic ascites. Musculoskeletal: No acute or significant osseous findings. IMPRESSION: 1. There is diffuse dilatation of the proximal to mid small bowel, with interval findings of a distal small  bowel resection. There appears to be an abrupt caliber change at the anastomosis, in the left lower quadrant (series 2, image 64). Largest loops of bowel measure 3.8 cm. There is gas and stool present in the colon to the rectum. 2.  Other chronic and incidental findings as detailed above. Electronically Signed   By: Eddie Candle M.D.   On: 11/12/2018 15:00   Dg Chest 2 View Result Date: 11/12/2018 CLINICAL DATA:  Fall at home today.  Weakness. EXAM: CHEST - 2 VIEW COMPARISON:  09/13/2018 FINDINGS: The heart size and mediastinal contours are within normal limits. Both lungs are clear. The visualized skeletal structures are unremarkable. IMPRESSION: No active cardiopulmonary disease. Electronically Signed   By: Earle Gell M.D.   On: 11/12/2018 10:19   Dg Lumbar Spine Complete Result Date: 11/12/2018 CLINICAL DATA:  Status post fall with leg pain. EXAM: LUMBAR SPINE - COMPLETE 4+ VIEW COMPARISON:  Body CT 09/15/2018 FINDINGS: There is no evidence of lumbar spine fracture. Alignment is normal. Multilevel osteoarthritic changes. Dilated small bowel loops measure up to 6.2 cm. IMPRESSION: Dilated small bowel loops measure up to 6.2 cm, consistent with small bowel obstruction. No evidence of lumbosacral spine fracture. Electronically Signed   By: Fidela Salisbury M.D.   On: 11/12/2018 10:24   Ct Head Wo Contrast Result Date: 11/12/2018 CLINICAL DATA:  Patient fell from standing position with left leg weakness. EXAM: CT HEAD WITHOUT CONTRAST CT CERVICAL SPINE WITHOUT CONTRAST TECHNIQUE: Multidetector CT imaging of the head and cervical spine was performed following the standard protocol without intravenous contrast. Multiplanar CT image reconstructions of the cervical spine were also generated. COMPARISON:  Brain MRI 08/28/2018 FINDINGS: CT HEAD FINDINGS Brain: No evidence of acute infarction, hemorrhage, hydrocephalus, extra-axial collection or mass lesion/mass effect. Lacunar infarct in the right pre central gyrus demonstrated by MRI is not well seen by CT. Remote right thalamic ischemic infarct. Stable brain parenchymal volume loss with ventriculomegaly and mild deep white matter microangiopathy. Vascular: Calcific atherosclerotic disease. Skull: Normal. Negative for fracture or focal lesion. Sinuses/Orbits: No acute finding. Other: None. CT CERVICAL SPINE FINDINGS Alignment: Reversal of cervical lordosis. Skull base and vertebrae: No acute fracture. 12 mm sclerotic lesion within the right transverse process/articular facet of C7 vertebral body Soft tissues and spinal canal: No prevertebral fluid or swelling. No visible canal hematoma. Small amount of gas within the spinal canal/left neural foramen at L3-L4, likely degenerative. Disc levels:  Multilevel osteoarthritic changes. Upper chest: Negative. Other: None. IMPRESSION: 1. No acute intracranial abnormality. 2. Atrophy, chronic microvascular disease. 3. Lacunar infarct in the right pre central gyrus demonstrated by MRI is not well seen by CT. 4. No evidence of acute traumatic injury to the cervical spine. 5. 12 mm sclerotic lesion within the right transverse process/articular facet of C7 vertebral body. This may represent a bone island versus sclerotic metastatic lesion. Electronically Signed   By: Fidela Salisbury M.D.   On: 11/12/2018 10:38   Ct Cervical Spine Wo Contrast Result Date: 11/12/2018 CLINICAL DATA:  Patient fell from standing position with left leg weakness. EXAM: CT HEAD WITHOUT CONTRAST CT CERVICAL SPINE WITHOUT CONTRAST TECHNIQUE: Multidetector CT imaging of the head and cervical spine was performed following the standard protocol without intravenous contrast. Multiplanar CT image reconstructions of the cervical spine were also generated. COMPARISON:  Brain MRI 08/28/2018 FINDINGS: CT HEAD FINDINGS Brain: No evidence of acute infarction, hemorrhage, hydrocephalus, extra-axial collection or mass lesion/mass effect. Lacunar infarct in the right pre central gyrus  demonstrated by MRI is not well seen by CT. Remote right thalamic ischemic infarct. Stable brain parenchymal volume loss with ventriculomegaly and mild deep white matter microangiopathy. Vascular: Calcific atherosclerotic disease. Skull: Normal. Negative for fracture or focal lesion. Sinuses/Orbits: No acute finding. Other: None. CT CERVICAL SPINE FINDINGS Alignment: Reversal of cervical lordosis. Skull base and vertebrae: No acute fracture. 12 mm sclerotic lesion within the right transverse process/articular facet of C7 vertebral body Soft tissues and spinal canal: No prevertebral fluid or swelling. No visible canal hematoma. Small amount of gas within the spinal canal/left neural foramen at L3-L4, likely degenerative. Disc levels:  Multilevel osteoarthritic changes. Upper chest: Negative. Other: None. IMPRESSION: 1. No acute intracranial abnormality. 2. Atrophy, chronic microvascular disease. 3. Lacunar infarct in the right pre central gyrus demonstrated by MRI is not well seen by CT. 4. No evidence of acute traumatic injury to the cervical spine. 5. 12 mm sclerotic lesion within the right transverse process/articular facet of C7 vertebral body. This may represent a bone island versus sclerotic metastatic lesion. Electronically Signed    By: Fidela Salisbury M.D.   On: 11/12/2018 10:38   US Abdomen Limited Result Date: 11/12/2018 CLINICAL DATA:  Elevated liver function tests. EXAM: ULTRASOUND ABDOMEN LIMITED RIGHT UPPER QUADRANT COMPARISON:  CT abdomen and pelvis 09/15/2018. FINDINGS: Gallbladder: The gallbladder is incompletely distended. There is some sludge within the gallbladder. No pericholecystic fluid. Sonographer reports negative Murphy's sign. No wall thickening. Common bile duct: Diameter: 0.4 cm Liver: Multiple up hepatic cysts are identified as seen on prior CT scan. Echogenicity is mildly increased. Portal vein is patent on color Doppler imaging with normal direction of blood flow towards the liver. IMPRESSION: Mild appearing fatty infiltration of the liver. Small volume of gallbladder sludge. Negative for stones or cholecystitis. Multiple hepatic cysts. Electronically Signed   By: Inge Rise M.D.   On: 11/12/2018 11:34   Dg Hips Bilat W Or Wo Pelvis 5 Views  Result Date: 11/12/2018 CLINICAL DATA:  Status post fall with leg pain. EXAM: DG HIP (WITH OR WITHOUT PELVIS) 5+V BILAT COMPARISON:  None. FINDINGS: There is no evidence of hip fracture or dislocation. There is no evidence of arthropathy or other focal bone abnormality. IMPRESSION: Negative. Electronically Signed   By: Fidela Salisbury M.D.   On: 11/12/2018 10:20     1510:  Lactic acid elevated as well as CK; IVF boluses given.  2nd lactic acid improved. Potassium and magnesium repleted IV. New LFT's elevation, but no acute findings in RUQ on [place] or CT scan. XR and f/u CT scan with SBO. Pt remains without abd pain, awake/alert, resps easy, NAD.  T/C returned from [facility] Dr. Arnoldo Morale, case discussed, including:  HPI, pertinent PM/SHx, VS/PE, dx testing, ED course and treatment:  Agreeable to admit.   1530:  T/C returned from [facility] Dr. Denton Brick, case discussed, including:  HPI, pertinent PM/SHx, VS/PE, dx testing, ED course and treatment:  Agreeable to  admit.      Final Clinical Impressions(s) / ED Diagnoses   Final diagnoses:  None    ED Discharge Orders    None       Francine Graven, DO 11/14/18 [id number]

## 2018-11-12 NOTE — ED Notes (Signed)
Due to patient unable to stand and his blood pressure readings, orthostatic vital signs standing could not be completed.

## 2018-11-12 NOTE — ED Notes (Signed)
Patient transported to CT and xray 

## 2018-11-12 NOTE — ED Notes (Signed)
Patient transported to Ultrasound 

## 2018-11-12 NOTE — ED Notes (Signed)
Pt returned from radiology.

## 2018-11-12 NOTE — ED Notes (Signed)
Date and time results received: 11/12/18 0954  Test: Lactic Acid Critical Value: 3.2  Name of Provider Notified: Dr. Thurnell Garbe  Orders Received? Or Actions Taken?: See chart

## 2018-11-12 NOTE — ED Notes (Signed)
Date and time results received: 11/12/18 1018  Test: Potassium Critical Value: 2.0  Name of Provider Notified: Dr. Thurnell Garbe  Orders Received? Or Actions Taken?: See chart

## 2018-11-13 DIAGNOSIS — K56609 Unspecified intestinal obstruction, unspecified as to partial versus complete obstruction: Secondary | ICD-10-CM

## 2018-11-13 LAB — COMPREHENSIVE METABOLIC PANEL
ALBUMIN: 2 g/dL — AB (ref 3.5–5.0)
ALK PHOS: 91 U/L (ref 38–126)
ALT: 227 U/L — ABNORMAL HIGH (ref 0–44)
AST: 377 U/L — ABNORMAL HIGH (ref 15–41)
Anion gap: 8 (ref 5–15)
BUN: 13 mg/dL (ref 8–23)
CO2: 33 mmol/L — ABNORMAL HIGH (ref 22–32)
Calcium: 6.9 mg/dL — ABNORMAL LOW (ref 8.9–10.3)
Chloride: 96 mmol/L — ABNORMAL LOW (ref 98–111)
Creatinine, Ser: 0.53 mg/dL — ABNORMAL LOW (ref 0.61–1.24)
GFR calc Af Amer: >60 mL/min (ref >60)
GFR calc non Af Amer: >60 mL/min (ref >60)
Glucose, Bld: 75 mg/dL (ref 70–99)
Potassium: 2.8 mmol/L — ABNORMAL LOW (ref 3.5–5.1)
SODIUM: 137 mmol/L (ref 135–145)
Total Bilirubin: 1 mg/dL (ref 0.3–1.2)
Total Protein: 4.9 g/dL — ABNORMAL LOW (ref 6.5–8.1)

## 2018-11-13 LAB — CK: CK TOTAL: 7667 U/L — AB (ref 49–397)

## 2018-11-13 LAB — MAGNESIUM: Magnesium: 1.8 mg/dL (ref 1.7–2.4)

## 2018-11-13 LAB — MRSA PCR SCREENING: MRSA by PCR: NEGATIVE

## 2018-11-13 LAB — POTASSIUM: Potassium: 2.3 mmol/L — CL (ref 3.5–5.1)

## 2018-11-13 MED ORDER — TAMSULOSIN HCL 0.4 MG PO CAPS
0.4000 mg | ORAL_CAPSULE | Freq: Every day | ORAL | Status: DC
  Administered 2018-11-13 – 2018-11-24 (×12): 0.4 mg via ORAL
  Filled 2018-11-13 (×12): qty 1

## 2018-11-13 MED ORDER — POTASSIUM CHLORIDE CRYS ER 20 MEQ PO TBCR: 40.0000 meq | EXTENDED_RELEASE_TABLET | ORAL | Status: DC

## 2018-11-13 MED ORDER — MAGNESIUM SULFATE 2 GM/50ML IV SOLN
2.0000 g | Freq: Once | INTRAVENOUS | Status: AC
  Administered 2018-11-13: 2 g via INTRAVENOUS
  Filled 2018-11-13: qty 50

## 2018-11-13 MED ORDER — POTASSIUM CHLORIDE CRYS ER 20 MEQ PO TBCR
40.0000 meq | EXTENDED_RELEASE_TABLET | ORAL | Status: AC
  Administered 2018-11-13 (×2): 40 meq via ORAL
  Filled 2018-11-13 (×2): qty 2

## 2018-11-13 MED ORDER — PANTOPRAZOLE SODIUM 40 MG PO TBEC
40.0000 mg | DELAYED_RELEASE_TABLET | Freq: Every day | ORAL | Status: DC
  Administered 2018-11-13 – 2018-11-24 (×12): 40 mg via ORAL
  Filled 2018-11-13 (×12): qty 1

## 2018-11-13 MED ORDER — POTASSIUM CHLORIDE IN NACL 40-0.9 MEQ/L-% IV SOLN
INTRAVENOUS | Status: DC
  Administered 2018-11-13: 125 mL/h via INTRAVENOUS

## 2018-11-13 NOTE — Clinical Social Work Note (Signed)
CSW approached patient about PT recommendation of rehab.  Patient immediately stated that he has no intention of going to rehab.  "I can't leave my girlfriend at home."  When asked about his concerns or worries, patient refused to tell me.  I asked for permission to call girlfriend to find out her opinion, and he agreed, but stated he did not know number.  I left the room to find the number, returned, and asked about calling home number or cell number.  "I ain't talking no more.  You can leave now."  I left his room, and called Ms Cantrell, girlfirend, on home phone number, [phone].  She stated he needs to go to rehab, stated she would come talk to him, and then call me back with results of their conversation.

## 2018-11-13 NOTE — Progress Notes (Signed)
PROGRESS NOTE    Joshua Mckee  SWN:[id number] DOB: 07/06/1941 DOA: 11/12/2018 PCP: Dettinger, Fransisca Kaufmann, MD   Brief Narrative:  Per HPI: Joshua Mckee is a 78 y.o. male with medical history significant for SBO, prostate cancer, GI bleed, alcohol abuse, seizure disorder who was brought to the ED via EMS after he fell and he was unable to get up, significant other could not help getting up either.  He reports his left leg that has been giving him problems just give out.  He denies vomiting or loose stools, reports good p.o. intake.  Is abdominal pain or distention.  Last bowel movement was today.  Reports he has not drank alcohol in over a year.  Recent hospitalization 12/27 to 09/21/2018 for small bowel obstruction, requiring exploratory laparotomy for small bowel resection and lysis of adhesions.  Discharge to home with home health, and has done well from a surgical standpoint.  Patient has been admitted with a fall secondary to chronic left ankle problems, but has incidentally been noted to have severe hypokalemia and hypomagnesemia likely secondary to HCTZ use.  He has also been noted to have rhabdomyolysis from the fall.  Patient appears to have an incidental finding of small bowel obstruction on imaging as well but is otherwise asymptomatic.  Assessment & Plan:   Active Problems:   Hypokalemia   1. Fall-mechanical, secondary to chronic left ankle problems.  PT evaluation pending with fall precautions.  May also be slightly associated with electrolyte abnormalities. 2. Hypokalemia/hypomagnesemia.  Likely secondary to HCTZ use.  Continue to replete potassium to goal of 4 along with magnesium to goal of 2.  Repletion continued via oral and IV routes.  We will recheck labs in a.m. 3. Rhabdomyolysis secondary to fall.  Continue to monitor CK levels and maintain on aggressive IV fluid hydration.  Avoid statins. 4. Transaminitis.  Significant AST elevation consistent with alcoholic liver disease.   Continue to trend with hepatitis panel pending.  Ultrasound negative for stones or cholecystitis, but there are multiple hepatic cysts. 5. Mild troponin elevation.  Flat trend and likely associated with rhabdomyolysis with no associated chest pain. 6. Small bowel obstruction-incidental finding.  Will start clear liquid diet this morning as patient is hungry.  He seems to state that he has had a bowel movement in the past 2 days and denies any nausea or vomiting.  Appreciate general surgery evaluation as patient was recently evaluated for the same in early January. 7. Lactic acidosis-improving.  Likely secondary to dehydration and rhabdomyolysis.  Continue hydration and recheck in a.m. 8. C7 vertebral lesion-incidental finding.  History of prostate cancer in the past.  Will need to be reevaluated in the outpatient setting. 9. History of prostate cancer.  Follow-up with urology in outpatient setting.  Resume home tamsulosin. 10. History of prior alcohol abuse.  No alcohol intake noted in the past year.  Continue to monitor for any DTs or tremors. 11. History of seizures.  Continue on IV Keppra for now and switch to p.o. by a.m. if tolerating diet.   DVT prophylaxis: SCDs Code Status: Full Family Communication: None at bedside Disposition Plan: IV fluid hydration and treatment of rhabdomyolysis, correction of electrolyte abnormalities, general surgery evaluation.  May transfer to telemetry floor when bed available.   Consultants:   General surgery  Procedures:   None  Antimicrobials:   None   Subjective: Patient seen and evaluated today with no new acute complaints or concerns. No acute concerns or events noted overnight.  He denies any chest pain, shortness of breath, or other aches or pains.  He denies abdominal pain, nausea, vomiting and has passed some flatus.  He is hungry and would like to have something to eat.  Objective: Vitals:   11/13/18 0300 11/13/18 0400 11/13/18 0500  11/13/18 0600  BP: 101/73 112/78 95/79 105/79  Pulse: 68 74 70 72  Resp: 16 15 14 17   Temp:  97.6 F (36.4 C)    TempSrc:  Oral    SpO2: 100% 100% 100% 100%  Weight:   73.4 kg   Height:        Intake/Output Summary (Last 24 hours) at 11/13/2018 0729 Last data filed at 11/13/2018 0500 Gross per 24 hour  Intake 3579.12 ml  Output 450 ml  Net 3129.12 ml   Filed Weights   11/12/18 0831 11/12/18 1732 11/13/18 0500  Weight: 73.9 kg 71.4 kg 73.4 kg    Examination:  General exam: Appears calm and comfortable  Respiratory system: Clear to auscultation. Respiratory effort normal.  Currently on 2 L nasal cannula. Cardiovascular system: S1 & S2 heard, RRR. No JVD, murmurs, rubs, gallops or clicks. No pedal edema. Gastrointestinal system: Abdomen is nondistended, soft and nontender. No organomegaly or masses felt. Normal bowel sounds heard. Central nervous system: Alert and oriented. No focal neurological deficits. Extremities: Symmetric 5 x 5 power. Skin: No rashes, lesions or ulcers Psychiatry: Judgement and insight appear normal. Mood & affect appropriate.     Data Reviewed: I have personally reviewed following labs and imaging studies  CBC: Recent Labs  Lab 11/12/18 0904  WBC 9.0  NEUTROABS 7.4  HGB 11.9*  HCT 36.9*  MCV 85.2  PLT 628   Basic Metabolic Panel: Recent Labs  Lab 11/12/18 0904 11/12/18 2230 11/13/18 0507  NA 136  --  137  K 2.0* 2.3* 2.8*  CL 85*  --  96*  CO2 37*  --  33*  GLUCOSE 105*  --  75  BUN 21  --  13  CREATININE 0.75  --  0.53*  CALCIUM 7.2*  --  6.9*  MG 1.4*  --  1.8   GFR: Estimated Creatinine Clearance: 65.1 mL/min (A) (by C-G formula based on SCr of 0.53 mg/dL (L)). Liver Function Tests: Recent Labs  Lab 11/12/18 0904 11/13/18 0507  AST 425* 377*  ALT 255* 227*  ALKPHOS 105 91  BILITOT 0.9 1.0  PROT 5.8* 4.9*  ALBUMIN 2.4* 2.0*   No results for input(s): LIPASE, AMYLASE in the last 168 hours. No results for input(s):  AMMONIA in the last 168 hours. Coagulation Profile: No results for input(s): INR, PROTIME in the last 168 hours. Cardiac Enzymes: Recent Labs  Lab 11/12/18 0904 11/12/18 1617 11/12/18 2230 11/13/18 0507  CKTOTAL 7,546*  --   --  7,667*  TROPONINI 0.03* 0.03* 0.03*  --    BNP (last 3 results) No results for input(s): PROBNP in the last 8760 hours. HbA1C: No results for input(s): HGBA1C in the last 72 hours. CBG: No results for input(s): GLUCAP in the last 168 hours. Lipid Profile: No results for input(s): CHOL, HDL, LDLCALC, TRIG, CHOLHDL, LDLDIRECT in the last 72 hours. Thyroid Function Tests: No results for input(s): TSH, T4TOTAL, FREET4, T3FREE, THYROIDAB in the last 72 hours. Anemia Panel: No results for input(s): VITAMINB12, FOLATE, FERRITIN, TIBC, IRON, RETICCTPCT in the last 72 hours. Sepsis Labs: Recent Labs  Lab 11/12/18 0904 11/12/18 1057  LATICACIDVEN 3.2* 1.3    Recent Results (from the past  240 hour(s))  MRSA PCR Screening     Status: None   Collection Time: 11/12/18  5:21 PM  Result Value Ref Range Status   MRSA by PCR NEGATIVE NEGATIVE Final    Comment:        The GeneXpert MRSA Assay (FDA approved for NASAL specimens only), is one component of a comprehensive MRSA colonization surveillance program. It is not intended to diagnose MRSA infection nor to guide or monitor treatment for MRSA infections. Performed at [facility], [address], [place], [place] [address]          Radiology Studies: Ct Abdomen Pelvis Wo Contrast  Result Date: 11/12/2018 CLINICAL DATA:  Fall, evaluate possible small bowel obstruction EXAM: CT ABDOMEN AND PELVIS WITHOUT CONTRAST TECHNIQUE: Multidetector CT imaging of the abdomen and pelvis was performed following the standard protocol without IV contrast. COMPARISON:  CT abdomen pelvis, 09/15/2018 FINDINGS: Lower chest: Bibasilar scarring and irregular opacity, improved compared to prior examination. Hepatobiliary:  Numerous small fluid attenuation liver lesions. No gallstones, gallbladder wall thickening, or biliary dilatation. Pancreas: Unremarkable. No pancreatic ductal dilatation or surrounding inflammatory changes. Spleen: Normal in size without focal abnormality. Adrenals/Urinary Tract: Adrenal glands are unremarkable. Kidneys are normal, without renal calculi, focal lesion, or hydronephrosis. Bladder is unremarkable. Stomach/Bowel: Stomach is within normal limits. There is diffuse dilatation of the proximal to mid small bowel, with interval findings of a distal small bowel resection. There appears to be an abrupt caliber change at the anastomosis, in the left lower quadrant (series 2, image 64). Largest loops of bowel measure 3.8 cm. There is gas and stool present in the colon to the rectum. Vascular/Lymphatic: Calcific atherosclerosis. No enlarged abdominal or pelvic lymph nodes. Reproductive: No mass or other abnormality. Other: No abdominal wall hernia or abnormality. No abdominopelvic ascites. Musculoskeletal: No acute or significant osseous findings. IMPRESSION: 1. There is diffuse dilatation of the proximal to mid small bowel, with interval findings of a distal small bowel resection. There appears to be an abrupt caliber change at the anastomosis, in the left lower quadrant (series 2, image 64). Largest loops of bowel measure 3.8 cm. There is gas and stool present in the colon to the rectum. 2.  Other chronic and incidental findings as detailed above. Electronically Signed   By: Eddie Candle M.D.   On: 11/12/2018 15:00   Dg Chest 2 View  Result Date: 11/12/2018 CLINICAL DATA:  Fall at home today.  Weakness. EXAM: CHEST - 2 VIEW COMPARISON:  09/13/2018 FINDINGS: The heart size and mediastinal contours are within normal limits. Both lungs are clear. The visualized skeletal structures are unremarkable. IMPRESSION: No active cardiopulmonary disease. Electronically Signed   By: Earle Gell M.D.   On: 11/12/2018  10:19   Dg Lumbar Spine Complete  Result Date: 11/12/2018 CLINICAL DATA:  Status post fall with leg pain. EXAM: LUMBAR SPINE - COMPLETE 4+ VIEW COMPARISON:  Body CT 09/15/2018 FINDINGS: There is no evidence of lumbar spine fracture. Alignment is normal. Multilevel osteoarthritic changes. Dilated small bowel loops measure up to 6.2 cm. IMPRESSION: Dilated small bowel loops measure up to 6.2 cm, consistent with small bowel obstruction. No evidence of lumbosacral spine fracture. Electronically Signed   By: Fidela Salisbury M.D.   On: 11/12/2018 10:24   Ct Head Wo Contrast  Result Date: 11/12/2018 CLINICAL DATA:  Patient fell from standing position with left leg weakness. EXAM: CT HEAD WITHOUT CONTRAST CT CERVICAL SPINE WITHOUT CONTRAST TECHNIQUE: Multidetector CT imaging of the head and cervical  spine was performed following the standard protocol without intravenous contrast. Multiplanar CT image reconstructions of the cervical spine were also generated. COMPARISON:  Brain MRI 08/28/2018 FINDINGS: CT HEAD FINDINGS Brain: No evidence of acute infarction, hemorrhage, hydrocephalus, extra-axial collection or mass lesion/mass effect. Lacunar infarct in the right pre central gyrus demonstrated by MRI is not well seen by CT. Remote right thalamic ischemic infarct. Stable brain parenchymal volume loss with ventriculomegaly and mild deep white matter microangiopathy. Vascular: Calcific atherosclerotic disease. Skull: Normal. Negative for fracture or focal lesion. Sinuses/Orbits: No acute finding. Other: None. CT CERVICAL SPINE FINDINGS Alignment: Reversal of cervical lordosis. Skull base and vertebrae: No acute fracture. 12 mm sclerotic lesion within the right transverse process/articular facet of C7 vertebral body Soft tissues and spinal canal: No prevertebral fluid or swelling. No visible canal hematoma. Small amount of gas within the spinal canal/left neural foramen at L3-L4, likely degenerative. Disc levels:   Multilevel osteoarthritic changes. Upper chest: Negative. Other: None. IMPRESSION: 1. No acute intracranial abnormality. 2. Atrophy, chronic microvascular disease. 3. Lacunar infarct in the right pre central gyrus demonstrated by MRI is not well seen by CT. 4. No evidence of acute traumatic injury to the cervical spine. 5. 12 mm sclerotic lesion within the right transverse process/articular facet of C7 vertebral body. This may represent a bone island versus sclerotic metastatic lesion. Electronically Signed   By: Fidela Salisbury M.D.   On: 11/12/2018 10:38   Ct Cervical Spine Wo Contrast  Result Date: 11/12/2018 CLINICAL DATA:  Patient fell from standing position with left leg weakness. EXAM: CT HEAD WITHOUT CONTRAST CT CERVICAL SPINE WITHOUT CONTRAST TECHNIQUE: Multidetector CT imaging of the head and cervical spine was performed following the standard protocol without intravenous contrast. Multiplanar CT image reconstructions of the cervical spine were also generated. COMPARISON:  Brain MRI 08/28/2018 FINDINGS: CT HEAD FINDINGS Brain: No evidence of acute infarction, hemorrhage, hydrocephalus, extra-axial collection or mass lesion/mass effect. Lacunar infarct in the right pre central gyrus demonstrated by MRI is not well seen by CT. Remote right thalamic ischemic infarct. Stable brain parenchymal volume loss with ventriculomegaly and mild deep white matter microangiopathy. Vascular: Calcific atherosclerotic disease. Skull: Normal. Negative for fracture or focal lesion. Sinuses/Orbits: No acute finding. Other: None. CT CERVICAL SPINE FINDINGS Alignment: Reversal of cervical lordosis. Skull base and vertebrae: No acute fracture. 12 mm sclerotic lesion within the right transverse process/articular facet of C7 vertebral body Soft tissues and spinal canal: No prevertebral fluid or swelling. No visible canal hematoma. Small amount of gas within the spinal canal/left neural foramen at L3-L4, likely degenerative.  Disc levels:  Multilevel osteoarthritic changes. Upper chest: Negative. Other: None. IMPRESSION: 1. No acute intracranial abnormality. 2. Atrophy, chronic microvascular disease. 3. Lacunar infarct in the right pre central gyrus demonstrated by MRI is not well seen by CT. 4. No evidence of acute traumatic injury to the cervical spine. 5. 12 mm sclerotic lesion within the right transverse process/articular facet of C7 vertebral body. This may represent a bone island versus sclerotic metastatic lesion. Electronically Signed   By: Fidela Salisbury M.D.   On: 11/12/2018 10:38   US Abdomen Limited  Result Date: 11/12/2018 CLINICAL DATA:  Elevated liver function tests. EXAM: ULTRASOUND ABDOMEN LIMITED RIGHT UPPER QUADRANT COMPARISON:  CT abdomen and pelvis 09/15/2018. FINDINGS: Gallbladder: The gallbladder is incompletely distended. There is some sludge within the gallbladder. No pericholecystic fluid. Sonographer reports negative Murphy's sign. No wall thickening. Common bile duct: Diameter: 0.4 cm Liver: Multiple up hepatic cysts  are identified as seen on prior CT scan. Echogenicity is mildly increased. Portal vein is patent on color Doppler imaging with normal direction of blood flow towards the liver. IMPRESSION: Mild appearing fatty infiltration of the liver. Small volume of gallbladder sludge. Negative for stones or cholecystitis. Multiple hepatic cysts. Electronically Signed   By: Inge Rise M.D.   On: 11/12/2018 11:34   Dg Hips Bilat W Or Wo Pelvis 5 Views  Result Date: 11/12/2018 CLINICAL DATA:  Status post fall with leg pain. EXAM: DG HIP (WITH OR WITHOUT PELVIS) 5+V BILAT COMPARISON:  None. FINDINGS: There is no evidence of hip fracture or dislocation. There is no evidence of arthropathy or other focal bone abnormality. IMPRESSION: Negative. Electronically Signed   By: Fidela Salisbury M.D.   On: 11/12/2018 10:20        Scheduled Meds: . potassium chloride  40 mEq Oral Q4H    Continuous Infusions: . 0.9 % NaCl with KCl 40 mEq / L    . famotidine (PEPCID) IV Stopped (11/12/18 1908)  . levETIRAcetam Stopped (11/12/18 2226)  . magnesium sulfate 1 - 4 g bolus IVPB       LOS: 1 day    Time spent: 30 minutes    [place] Darleen Crocker, DO [facility] Pager [phone]  If 7PM-7AM, please contact night-coverage [email or web address] Password [facility] 11/13/2018, 7:29 AM

## 2018-11-13 NOTE — Consult Note (Signed)
Reason for Consult: Small bowel obstruction Referring Physician: Dr. Laurelyn Sickle Peral is an 78 y.o. male.  HPI: Patient is a 78 year old black male who was brought in by EMS after he fell and could not get up.  In the emergency room, he was found to have elevated liver enzyme test as well as CPK.  He was hypo-kalemia.  CT scan of the abdomen revealed possible SBO.  Patient was admitted to the intensive care unit for further evaluation treatment.  He denies any nausea, vomiting, abdominal pain, diarrhea, constipation.  He is hungry.  He has 0 out of 10 abdominal pain.  Past Medical History:  Diagnosis Date  . AKI (acute kidney injury) ([place]) 09/15/2018  . Aneurysm of right common iliac artery (HCC)    2.2 cm per CT 02-18-2017  . Arthritis   . GERD (gastroesophageal reflux disease)   . Hiatal hernia   . Hiccups    CHRONIC--- MULTIPLE ED VISITS  . History of alcohol abuse   . History of GI bleed    upper GI bleed from esophageal ulcer 01/ 2017 and 03/ 2017 from erosive esophageal gastiris  . 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 History of small bowel obstruction    04-06-2005  s/p  exp. lap. w/ lysis adhesions for partial sbo  . Hyperlipidemia   . Hyperlipidemia   . Hyperplasia of prostate with lower urinary tract symptoms (LUTS)   . Hypertension   . LAFB (left anterior fascicular block)   . Mild cognitive impairment with memory loss    PROBABLE STROKE RESIDUAL BUT POSSIBLE PRIOR TO STROKE  . Partial seizure ([place])    x1  10-15-2016 at ED visit , post cva 08-27-2016  . Poor historian   . Prostate cancer [facility]) urologist-  dr Jeffie Pollock  . Schatzki's ring   . Stroke ([place])   . Substance abuse [facility])     Past Surgical History:  Procedure Laterality Date  . BOWEL RESECTION N/A 09/16/2018   Procedure:  PARTIAL SMALL BOWEL RESECTION;  Surgeon: Aviva Signs, MD;  Location: [facility];  Service: General;  Laterality: N/A;  . COLONOSCOPY N/A 03/08/2016   Procedure: COLONOSCOPY;  Surgeon: Daneil Dolin, MD;  Location: [facility];  Service: Endoscopy;  Laterality: N/A;  1300-moved to 1400  . ESOPHAGOGASTRODUODENOSCOPY N/A 10/05/2015   Dr. Gala Romney: erosive reflux esophagitis, non-critcal Schatki's ring non-manipulated  . ESOPHAGOGASTRODUODENOSCOPY N/A 12/15/2015   Dr. Rourk:non bleeding esophageal ulcer/small HH  . ESOPHAGOGASTRODUODENOSCOPY N/A 03/08/2016   Procedure: ESOPHAGOGASTRODUODENOSCOPY (EGD);  Surgeon: Daneil Dolin, MD;  Location: [facility];  Service: Endoscopy;  Laterality: N/A;  . EXPLORATORY LAPAROTOMY W/ ADHESIOLYSIS  04/06/2005   partial SBO  . LAPAROTOMY N/A 09/16/2018   Procedure: EXPLORATORY LAPAROTOMY;  Surgeon: Aviva Signs, MD;  Location: [facility];  Service: General;  Laterality: N/A;  . LYSIS OF ADHESION N/A 09/16/2018   Procedure: LYSIS OF ADHESION;  Surgeon: Aviva Signs, MD;  Location: [facility];  Service: General;  Laterality: N/A;  . ORCHIECTOMY Bilateral 03/05/2017   Procedure: ORCHIECTOMY BILATERAL;  Surgeon: Irine Seal, MD;  Location: [facility];  Service: Urology;  Laterality: Bilateral;  . PROSTATE BIOPSY N/A 03/05/2017   Procedure: BIOPSY TRANSRECTAL ULTRASONIC PROSTATE (TUBP);  Surgeon: Irine Seal, MD;  Location: [facility];  Service: Urology;  Laterality:  N/A;  . TRANSTHORACIC ECHOCARDIOGRAM  08/28/2016   mild to moderate LVH, ef 55-60%/  mild AR and TR/  trivial MR    Family History  Problem Relation Age of Onset  . Colon cancer Neg Hx     Social History:  reports that he has never smoked. He has never used smokeless tobacco. He reports that he does not drink alcohol or use drugs.  Allergies: No Known Allergies  Medications: I have reviewed the patient's current medications.  Results for orders placed or performed during  the hospital encounter of 11/12/18 (from the past 48 hour(s))  Comprehensive metabolic panel     Status: Abnormal   Collection Time: 11/12/18  9:04 AM  Result Value Ref Range   Sodium 136 135 - 145 mmol/L   Potassium 2.0 (LL) 3.5 - 5.1 mmol/L    Comment: CRITICAL RESULT CALLED TO, READ BACK BY AND VERIFIED WITH: GENTRY,R AT 10:15AM ON 11/12/18 BY FESTERMAN,C    Chloride 85 (L) 98 - 111 mmol/L   CO2 37 (H) 22 - 32 mmol/L   Glucose, Bld 105 (H) 70 - 99 mg/dL   BUN 21 8 - 23 mg/dL   Creatinine, Ser 0.75 0.61 - 1.24 mg/dL   Calcium 7.2 (L) 8.9 - 10.3 mg/dL   Total Protein 5.8 (L) 6.5 - 8.1 g/dL   Albumin 2.4 (L) 3.5 - 5.0 g/dL   AST 425 (H) 15 - 41 U/L   ALT 255 (H) 0 - 44 U/L   Alkaline Phosphatase 105 38 - 126 U/L   Total Bilirubin 0.9 0.3 - 1.2 mg/dL   GFR calc non Af Amer >60 >60 mL/min   GFR calc Af Amer >60 >60 mL/min   Anion gap 14 5 - 15    Comment: Performed at [facility], [address] [place], [place] [address]  Troponin I - Once     Status: Abnormal   Collection Time: 11/12/18  9:04 AM  Result Value Ref Range   Troponin I 0.03 (HH) <0.03 ng/mL    Comment: CRITICAL RESULT CALLED TO, READ BACK BY AND VERIFIED WITH: GENTRY,R AT 10:15AM ON 11/12/18 BY [facility] Performed at [facility], [address] [place], [place] [address]   CBC with Differential     Status: Abnormal   Collection Time: 11/12/18  9:04 AM  Result Value Ref Range   WBC 9.0 4.0 - 10.5 K/uL   RBC 4.33 4.22 - 5.81 MIL/uL   Hemoglobin 11.9 (L) 13.0 - 17.0 g/dL   HCT 36.9 (L) 39.0 - 52.0 %   MCV 85.2 80.0 - 100.0 fL   MCH 27.5 26.0 - 34.0 pg   MCHC 32.2 30.0 - 36.0 g/dL   RDW 14.5 11.5 - 15.5 %   Platelets 202 150 - 400 K/uL   nRBC 0.0 0.0 - 0.2 %   Neutrophils Relative % 82 %   Neutro Abs 7.4 1.7 - 7.7 K/uL   Lymphocytes Relative 10 %   Lymphs Abs 0.9 0.7 - 4.0 K/uL   Monocytes Relative 7 %   Monocytes Absolute 0.6 0.1 - 1.0 K/uL   Eosinophils Relative 0 %   Eosinophils Absolute 0.0 0.0 -  0.5 K/uL   Basophils Relative 0 %   Basophils Absolute 0.0 0.0 - 0.1 K/uL   Immature Granulocytes 1 %   Abs Immature Granulocytes 0.05 0.00 - 0.07 K/uL    Comment: Performed at [facility], [address] [place], [place] [address]  Lactic acid, plasma  Status: Abnormal   Collection Time: 11/12/18  9:04 AM  Result Value Ref Range   Lactic Acid, Venous 3.2 (HH) 0.5 - 1.9 mmol/L    Comment: CRITICAL RESULT CALLED TO, READ BACK BY AND VERIFIED WITH: KEMP,C AT 9:50AM ON 11/12/18 BY [facility] Performed at [facility], [address] [place], [place] [address]   CK     Status: Abnormal   Collection Time: 11/12/18  9:04 AM  Result Value Ref Range   Total CK 7,546 (H) 49 - 397 U/L    Comment: RESULTS CONFIRMED BY MANUAL DILUTION Performed at [facility], [address], [place], [place] [address]   Acetaminophen level     Status: Abnormal   Collection Time: 11/12/18  9:04 AM  Result Value Ref Range   Acetaminophen (Tylenol), Serum <10 (L) 10 - 30 ug/mL    Comment: (NOTE) Therapeutic concentrations vary significantly. A range of 10-30 ug/mL  may be an effective concentration for many patients. However, some  are best treated at concentrations outside of this range. Acetaminophen concentrations >150 ug/mL at 4 hours after ingestion  and >50 ug/mL at 12 hours after ingestion are often associated with  toxic reactions. Performed at [facility], [address], [place], [place] [address]   Ethanol     Status: None   Collection Time: 11/12/18  9:04 AM  Result Value Ref Range   Alcohol, Ethyl (B) <10 <10 mg/dL    Comment: (NOTE) Lowest detectable limit for serum alcohol is 10 mg/dL. For medical purposes only. Performed at [facility], [address], [place], [place] [address]   Magnesium     Status: Abnormal   Collection Time: 11/12/18  9:04 AM  Result Value Ref Range   Magnesium 1.4 (L) 1.7 - 2.4 mg/dL    Comment: Performed at [facility], [address], [place], [place]  [address]  Lactic acid, plasma     Status: None   Collection Time: 11/12/18 10:57 AM  Result Value Ref Range   Lactic Acid, Venous 1.3 0.5 - 1.9 mmol/L    Comment: Performed at [facility], [address], [place], [place] [address]  Urinalysis, Routine w reflex microscopic     Status: Abnormal   Collection Time: 11/12/18 11:26 AM  Result Value Ref Range   Color, Urine YELLOW YELLOW   APPearance CLEAR CLEAR   Specific Gravity, Urine 1.017 1.005 - 1.030   pH 6.0 5.0 - 8.0   Glucose, UA NEGATIVE NEGATIVE mg/dL   Hgb urine dipstick LARGE (A) NEGATIVE   Bilirubin Urine NEGATIVE NEGATIVE   Ketones, ur NEGATIVE NEGATIVE mg/dL   Protein, ur 30 (A) NEGATIVE mg/dL   Nitrite NEGATIVE NEGATIVE   Leukocytes,Ua NEGATIVE NEGATIVE   RBC / HPF 0-5 0 - 5 RBC/hpf   WBC, UA 0-5 0 - 5 WBC/hpf   Bacteria, UA NONE SEEN NONE SEEN   Squamous Epithelial / LPF 0-5 0 - 5   Mucus PRESENT     Comment: Performed at [facility] [address], [place], [place] [address]  Urine rapid drug screen (hosp performed)     Status: None   Collection Time: 11/12/18  3:18 PM  Result Value Ref Range   Opiates NONE DETECTED NONE DETECTED   Cocaine NONE DETECTED NONE DETECTED   Benzodiazepines NONE DETECTED NONE DETECTED   Amphetamines NONE DETECTED NONE DETECTED   Tetrahydrocannabinol NONE DETECTED NONE DETECTED   Barbiturates NONE DETECTED NONE DETECTED    Comment: (NOTE) DRUG SCREEN FOR MEDICAL PURPOSES  ONLY.  IF CONFIRMATION IS NEEDED FOR ANY PURPOSE, NOTIFY LAB WITHIN 5 DAYS. LOWEST DETECTABLE LIMITS FOR URINE DRUG SCREEN Drug Class                     Cutoff (ng/mL) Amphetamine and metabolites    1000 Barbiturate and metabolites    200 Benzodiazepine                 035 Tricyclics and metabolites     300 Opiates and metabolites        300 Cocaine and metabolites        300 THC                            50 Performed at [facility], [address], [place], [place] [address]   Troponin I - Now Then Q6H      Status: Abnormal   Collection Time: 11/12/18  4:17 PM  Result Value Ref Range   Troponin I 0.03 (HH) <0.03 ng/mL    Comment: CRITICAL VALUE NOTED.  VALUE IS CONSISTENT WITH PREVIOUSLY REPORTED AND CALLED VALUE. Performed at [facility], [address], [place], [place] [address]   MRSA PCR Screening     Status: None   Collection Time: 11/12/18  5:21 PM  Result Value Ref Range   MRSA by PCR NEGATIVE NEGATIVE    Comment:        The GeneXpert MRSA Assay (FDA approved for NASAL specimens only), is one component of a comprehensive MRSA colonization surveillance program. It is not intended to diagnose MRSA infection nor to guide or monitor treatment for MRSA infections. Performed at [facility], [address] [place], [place] [address]   Troponin I - Now Then Q6H     Status: Abnormal   Collection Time: 11/12/18 10:30 PM  Result Value Ref Range   Troponin I 0.03 (HH) <0.03 ng/mL    Comment: CRITICAL VALUE NOTED.  VALUE IS CONSISTENT WITH PREVIOUSLY REPORTED AND CALLED VALUE. Performed at [facility], [address], [place], [place] [address]   Potassium     Status: Abnormal   Collection Time: 11/12/18 10:30 PM  Result Value Ref Range   Potassium 2.3 (LL) 3.5 - 5.1 mmol/L    Comment: CRITICAL RESULT CALLED TO, READ BACK BY AND VERIFIED WITH: Nicholes Rough @0050  11/13/18 [facility] Performed at [facility], [address], [place], [place] [address]   CK     Status: Abnormal   Collection Time: 11/13/18  5:07 AM  Result Value Ref Range   Total CK 7,667 (H) 49 - 397 U/L    Comment: Performed at [facility], [address], [place], [place] [address]  Magnesium     Status: None   Collection Time: 11/13/18  5:07 AM  Result Value Ref Range   Magnesium 1.8 1.7 - 2.4 mg/dL    Comment: Performed at [facility], [address], [place], [place] [address]  Comprehensive metabolic panel     Status: Abnormal   Collection Time: 11/13/18  5:07 AM  Result Value Ref Range   Sodium 137 135 -  145 mmol/L   Potassium 2.8 (L) 3.5 - 5.1 mmol/L    Comment: DELTA CHECK NOTED   Chloride 96 (L) 98 - 111 mmol/L   CO2 33 (H) 22 - 32 mmol/L   Glucose, Bld 75 70 - 99 mg/dL   BUN 13 8 - 23 mg/dL   Creatinine,  Ser 0.53 (L) 0.61 - 1.24 mg/dL   Calcium 6.9 (L) 8.9 - 10.3 mg/dL   Total Protein 4.9 (L) 6.5 - 8.1 g/dL   Albumin 2.0 (L) 3.5 - 5.0 g/dL   AST 377 (H) 15 - 41 U/L   ALT 227 (H) 0 - 44 U/L   Alkaline Phosphatase 91 38 - 126 U/L   Total Bilirubin 1.0 0.3 - 1.2 mg/dL   GFR calc non Af Amer >60 >60 mL/min   GFR calc Af Amer >60 >60 mL/min   Anion gap 8 5 - 15    Comment: Performed at [facility], [address], [place], [place] [address]    Ct Abdomen Pelvis Wo Contrast  Result Date: 11/12/2018 CLINICAL DATA:  Fall, evaluate possible small bowel obstruction EXAM: CT ABDOMEN AND PELVIS WITHOUT CONTRAST TECHNIQUE: Multidetector CT imaging of the abdomen and pelvis was performed following the standard protocol without IV contrast. COMPARISON:  CT abdomen pelvis, 09/15/2018 FINDINGS: Lower chest: Bibasilar scarring and irregular opacity, improved compared to prior examination. Hepatobiliary: Numerous small fluid attenuation liver lesions. No gallstones, gallbladder wall thickening, or biliary dilatation. Pancreas: Unremarkable. No pancreatic ductal dilatation or surrounding inflammatory changes. Spleen: Normal in size without focal abnormality. Adrenals/Urinary Tract: Adrenal glands are unremarkable. Kidneys are normal, without renal calculi, focal lesion, or hydronephrosis. Bladder is unremarkable. Stomach/Bowel: Stomach is within normal limits. There is diffuse dilatation of the proximal to mid small bowel, with interval findings of a distal small bowel resection. There appears to be an abrupt caliber change at the anastomosis, in the left lower quadrant (series 2, image 64). Largest loops of bowel measure 3.8 cm. There is gas and stool present in the colon to the rectum. Vascular/Lymphatic:  Calcific atherosclerosis. No enlarged abdominal or pelvic lymph nodes. Reproductive: No mass or other abnormality. Other: No abdominal wall hernia or abnormality. No abdominopelvic ascites. Musculoskeletal: No acute or significant osseous findings. IMPRESSION: 1. There is diffuse dilatation of the proximal to mid small bowel, with interval findings of a distal small bowel resection. There appears to be an abrupt caliber change at the anastomosis, in the left lower quadrant (series 2, image 64). Largest loops of bowel measure 3.8 cm. There is gas and stool present in the colon to the rectum. 2.  Other chronic and incidental findings as detailed above. Electronically Signed   By: Eddie Candle M.D.   On: 11/12/2018 15:00   Dg Chest 2 View  Result Date: 11/12/2018 CLINICAL DATA:  Fall at home today.  Weakness. EXAM: CHEST - 2 VIEW COMPARISON:  09/13/2018 FINDINGS: The heart size and mediastinal contours are within normal limits. Both lungs are clear. The visualized skeletal structures are unremarkable. IMPRESSION: No active cardiopulmonary disease. Electronically Signed   By: Earle Gell M.D.   On: 11/12/2018 10:19   Dg Lumbar Spine Complete  Result Date: 11/12/2018 CLINICAL DATA:  Status post fall with leg pain. EXAM: LUMBAR SPINE - COMPLETE 4+ VIEW COMPARISON:  Body CT 09/15/2018 FINDINGS: There is no evidence of lumbar spine fracture. Alignment is normal. Multilevel osteoarthritic changes. Dilated small bowel loops measure up to 6.2 cm. IMPRESSION: Dilated small bowel loops measure up to 6.2 cm, consistent with small bowel obstruction. No evidence of lumbosacral spine fracture. Electronically Signed   By: Fidela Salisbury M.D.   On: 11/12/2018 10:24   Ct Head Wo Contrast  Result Date: 11/12/2018 CLINICAL DATA:  Patient fell from standing position with left leg weakness. EXAM: CT HEAD WITHOUT CONTRAST CT CERVICAL SPINE  WITHOUT CONTRAST TECHNIQUE: Multidetector CT imaging of the head and cervical spine  was performed following the standard protocol without intravenous contrast. Multiplanar CT image reconstructions of the cervical spine were also generated. COMPARISON:  Brain MRI 08/28/2018 FINDINGS: CT HEAD FINDINGS Brain: No evidence of acute infarction, hemorrhage, hydrocephalus, extra-axial collection or mass lesion/mass effect. Lacunar infarct in the right pre central gyrus demonstrated by MRI is not well seen by CT. Remote right thalamic ischemic infarct. Stable brain parenchymal volume loss with ventriculomegaly and mild deep white matter microangiopathy. Vascular: Calcific atherosclerotic disease. Skull: Normal. Negative for fracture or focal lesion. Sinuses/Orbits: No acute finding. Other: None. CT CERVICAL SPINE FINDINGS Alignment: Reversal of cervical lordosis. Skull base and vertebrae: No acute fracture. 12 mm sclerotic lesion within the right transverse process/articular facet of C7 vertebral body Soft tissues and spinal canal: No prevertebral fluid or swelling. No visible canal hematoma. Small amount of gas within the spinal canal/left neural foramen at L3-L4, likely degenerative. Disc levels:  Multilevel osteoarthritic changes. Upper chest: Negative. Other: None. IMPRESSION: 1. No acute intracranial abnormality. 2. Atrophy, chronic microvascular disease. 3. Lacunar infarct in the right pre central gyrus demonstrated by MRI is not well seen by CT. 4. No evidence of acute traumatic injury to the cervical spine. 5. 12 mm sclerotic lesion within the right transverse process/articular facet of C7 vertebral body. This may represent a bone island versus sclerotic metastatic lesion. Electronically Signed   By: Fidela Salisbury M.D.   On: 11/12/2018 10:38   Ct Cervical Spine Wo Contrast  Result Date: 11/12/2018 CLINICAL DATA:  Patient fell from standing position with left leg weakness. EXAM: CT HEAD WITHOUT CONTRAST CT CERVICAL SPINE WITHOUT CONTRAST TECHNIQUE: Multidetector CT imaging of the head and  cervical spine was performed following the standard protocol without intravenous contrast. Multiplanar CT image reconstructions of the cervical spine were also generated. COMPARISON:  Brain MRI 08/28/2018 FINDINGS: CT HEAD FINDINGS Brain: No evidence of acute infarction, hemorrhage, hydrocephalus, extra-axial collection or mass lesion/mass effect. Lacunar infarct in the right pre central gyrus demonstrated by MRI is not well seen by CT. Remote right thalamic ischemic infarct. Stable brain parenchymal volume loss with ventriculomegaly and mild deep white matter microangiopathy. Vascular: Calcific atherosclerotic disease. Skull: Normal. Negative for fracture or focal lesion. Sinuses/Orbits: No acute finding. Other: None. CT CERVICAL SPINE FINDINGS Alignment: Reversal of cervical lordosis. Skull base and vertebrae: No acute fracture. 12 mm sclerotic lesion within the right transverse process/articular facet of C7 vertebral body Soft tissues and spinal canal: No prevertebral fluid or swelling. No visible canal hematoma. Small amount of gas within the spinal canal/left neural foramen at L3-L4, likely degenerative. Disc levels:  Multilevel osteoarthritic changes. Upper chest: Negative. Other: None. IMPRESSION: 1. No acute intracranial abnormality. 2. Atrophy, chronic microvascular disease. 3. Lacunar infarct in the right pre central gyrus demonstrated by MRI is not well seen by CT. 4. No evidence of acute traumatic injury to the cervical spine. 5. 12 mm sclerotic lesion within the right transverse process/articular facet of C7 vertebral body. This may represent a bone island versus sclerotic metastatic lesion. Electronically Signed   By: Fidela Salisbury M.D.   On: 11/12/2018 10:38   US Abdomen Limited  Result Date: 11/12/2018 CLINICAL DATA:  Elevated liver function tests. EXAM: ULTRASOUND ABDOMEN LIMITED RIGHT UPPER QUADRANT COMPARISON:  CT abdomen and pelvis 09/15/2018. FINDINGS: Gallbladder: The gallbladder is  incompletely distended. There is some sludge within the gallbladder. No pericholecystic fluid. Sonographer reports negative Murphy's sign. No wall  thickening. Common bile duct: Diameter: 0.4 cm Liver: Multiple up hepatic cysts are identified as seen on prior CT scan. Echogenicity is mildly increased. Portal vein is patent on color Doppler imaging with normal direction of blood flow towards the liver. IMPRESSION: Mild appearing fatty infiltration of the liver. Small volume of gallbladder sludge. Negative for stones or cholecystitis. Multiple hepatic cysts. Electronically Signed   By: Inge Rise M.D.   On: 11/12/2018 11:34   Dg Hips Bilat W Or Wo Pelvis 5 Views  Result Date: 11/12/2018 CLINICAL DATA:  Status post fall with leg pain. EXAM: DG HIP (WITH OR WITHOUT PELVIS) 5+V BILAT COMPARISON:  None. FINDINGS: There is no evidence of hip fracture or dislocation. There is no evidence of arthropathy or other focal bone abnormality. IMPRESSION: Negative. Electronically Signed   By: Fidela Salisbury M.D.   On: 11/12/2018 10:20    ROS:  Pertinent items are noted in HPI.  Blood pressure 105/79, pulse 72, temperature 97.6 F (36.4 C), temperature source Oral, resp. rate 17, height 5\' 5"  (1.651 m), weight 73.4 kg, SpO2 100 %. Physical Exam: Pleasant well-developed well-nourished black male no acute distress Head is normocephalic, atraumatic Lungs clear to auscultation with good breath sounds bilaterally Heart examination reveals a regular rate and rhythm without S3, S4, murmurs Abdomen is soft, flat.  Midline surgical incision well-healed.  Bowel sounds are occasionally heard.  No rigidity is noted.  CT scan images personally reviewed Labs reviewed  Assessment/Plan: Impression: Given his overall examination, he probably had bowel dilatation secondary to hypokalemia.  Doubt any surgical intervention warranted at this time.  May advance his diet as tolerated.  Will follow with you.  Aviva Signs 11/13/2018, 8:49 AM

## 2018-11-13 NOTE — Evaluation (Signed)
Physical Therapy Evaluation Patient Details Name: Joshua Mckee MRN: [id number] DOB: [birth year] Today's Date: 11/13/2018   History of Present Illness  Joshua Mckee is a 78 y.o. male with medical history significant for SBO, prostate cancer, GI bleed, alcohol abuse, seizure disorder who was brought to the ED via EMS after he fell and he was unable to get up, significant other could not help getting up either.  He reports his left leg that has been giving him problems just give out.  He denies vomiting or loose stools, reports good p.o. intake.  Is abdominal pain or distention.  Last bowel movement was today.  Reports he has not drank alcohol in over a year.    Clinical Impression  Patient functioning below baseline, at high risk for falls, limited  functional mobility and gait as stated below secondary to BLE weakness, fatigue and poor standing balance.  Patient limited to a few unsteady labored steps at bedside and tolerated sitting up in chair after therapy - RN aware.  Patient  will benefit from continued physical therapy in hospital and recommended venue below to increase strength, balance, endurance for safe ADLs and gait.    Follow Up Recommendations SNF    Equipment Recommendations  None recommended by PT    Recommendations for Other Services       Precautions / Restrictions Precautions Precautions: Fall Restrictions Weight Bearing Restrictions: No      Mobility  Bed Mobility Overal bed mobility: Needs Assistance Bed Mobility: Supine to Sit     Supine to sit: Mod assist     General bed mobility comments: slow labored movement with difficulty propping up on elbows to sit up due to weakness  Transfers Overall transfer level: Needs assistance Equipment used: Rolling walker (2 wheeled) Transfers: Sit to/from [facility] Sit to Stand: Min assist Stand pivot transfers: Min assist;Mod assist       General transfer comment: slow labored  movement  Ambulation/Gait Ambulation/Gait assistance: Mod assist Gait Distance (Feet): 5 Feet Assistive device: Rolling walker (2 wheeled) Gait Pattern/deviations: Decreased step length - right;Decreased step length - left;Decreased stride length Gait velocity: slow   General Gait Details: limited to 5-6 slow labored steps at bedside due to generalized weakness  Stairs            Wheelchair Mobility    Modified Rankin (Stroke Patients Only)       Balance Overall balance assessment: Needs assistance Sitting-balance support: Feet supported;No upper extremity supported Sitting balance-Leahy Scale: Fair     Standing balance support: Bilateral upper extremity supported;During functional activity Standing balance-Leahy Scale: Fair Standing balance comment: using RW                             Pertinent Vitals/Pain Pain Assessment: No/denies pain    Home Living Family/patient expects to be discharged to:: Private residence Living Arrangements: Spouse/significant other Available Help at Discharge: Available PRN/intermittently;Friend(s) Type of Home: Apartment Home Access: Level entry     Home Layout: One level Home Equipment: Walker - 2 wheels;Cane - single point;Bedside commode;Shower seat      Prior Function Level of Independence: Independent with assistive device(s)         Comments: Household ambulator with RW     Hand Dominance   Dominant Hand: Right    Extremity/Trunk Assessment   Upper Extremity Assessment Upper Extremity Assessment: Generalized weakness    Lower Extremity Assessment Lower Extremity Assessment: Generalized weakness  Cervical / Trunk Assessment Cervical / Trunk Assessment: Kyphotic  Communication   Communication: No difficulties  Cognition Arousal/Alertness: Awake/alert Behavior During Therapy: WFL for tasks assessed/performed;Impulsive Overall Cognitive Status: Within Functional Limits for tasks assessed                                         General Comments      Exercises     Assessment/Plan    PT Assessment Patient needs continued PT services  PT Problem List Decreased strength;Decreased activity tolerance;Decreased balance;Decreased mobility       PT Treatment Interventions Gait training;Functional mobility training;Therapeutic activities;Patient/family education;Therapeutic exercise    PT Goals (Current goals can be found in the Care Plan section)  Acute Rehab PT Goals Patient Stated Goal: return home with family to assist PT Goal Formulation: With patient/family Time For Goal Achievement: 11/27/18 Potential to Achieve Goals: Good    Frequency Min 3X/week   Barriers to discharge        Co-evaluation               AM-PAC PT "6 Clicks" Mobility  Outcome Measure Help needed turning from your back to your side while in a flat bed without using bedrails?: A Lot Help needed moving from lying on your back to sitting on the side of a flat bed without using bedrails?: A Lot Help needed moving to and from a bed to a chair (including a wheelchair)?: A Lot Help needed standing up from a chair using your arms (e.g., wheelchair or bedside chair)?: A Lot Help needed to walk in hospital room?: A Lot Help needed climbing 3-5 steps with a railing? : A Lot 6 Click Score: 12    End of Session Equipment Utilized During Treatment: Oxygen Activity Tolerance: Patient tolerated treatment well;Patient limited by fatigue Patient left: in chair;with call bell/phone within reach Nurse Communication: Mobility status PT Visit Diagnosis: Unsteadiness on feet (R26.81);Other abnormalities of gait and mobility (R26.89);Muscle weakness (generalized) (M62.81)    Time: 8325-4982 PT Time Calculation (min) (ACUTE ONLY): 31 min   Charges:   PT Evaluation $PT Eval Moderate Complexity: 1 Mod PT Treatments $Therapeutic Activity: 23-37 mins        9:03 AM, 11/13/18 [name], MPT Physical Therapist with [facility] [phone] office [phone] mobile phone

## 2018-11-13 NOTE — Progress Notes (Signed)
CRITICAL VALUE ALERT  Critical Value:  Potassium 2.3  Date & Time Notied: 11/13/18, 0050  Provider Notified: Baltazar Najjar  Orders Received/Actions taken:

## 2018-11-13 NOTE — Plan of Care (Signed)
  Problem: Acute Rehab PT Goals(only PT should resolve) Goal: Pt Will Go Supine/Side To Sit Flowsheets (Taken 11/13/2018 0904) Pt will go Supine/Side to Sit: with min guard assist Goal: Patient Will Transfer Sit To/From Stand Flowsheets (Taken 11/13/2018 0904) Patient will transfer sit to/from stand: with min guard assist Goal: Pt Will Transfer Bed To Chair/Chair To Bed Flowsheets (Taken 11/13/2018 0904) Pt will Transfer Bed to Chair/Chair to Bed: min guard assist Goal: Pt Will Ambulate Flowsheets (Taken 11/13/2018 0904) Pt will Ambulate: 25 feet; with minimal assist; with rolling walker   9:05 AM, 11/13/18 [name], MPT Physical Therapist with [facility] [phone] office [phone] mobile phone

## 2018-11-14 LAB — CBC
HCT: 36.1 % — ABNORMAL LOW (ref 39.0–52.0)
Hemoglobin: 11.3 g/dL — ABNORMAL LOW (ref 13.0–17.0)
MCH: 27.1 pg (ref 26.0–34.0)
MCHC: 31.3 g/dL (ref 30.0–36.0)
MCV: 86.6 fL (ref 80.0–100.0)
Platelets: 186 10*3/uL (ref 150–400)
RBC: 4.17 MIL/uL — ABNORMAL LOW (ref 4.22–5.81)
RDW: 14.8 % (ref 11.5–15.5)
WBC: 7.1 10*3/uL (ref 4.0–10.5)
nRBC: 0 % (ref 0.0–0.2)

## 2018-11-14 LAB — COMPREHENSIVE METABOLIC PANEL
ALBUMIN: 2.1 g/dL — AB (ref 3.5–5.0)
ALT: 257 U/L — ABNORMAL HIGH (ref 0–44)
AST: 426 U/L — ABNORMAL HIGH (ref 15–41)
Alkaline Phosphatase: 92 U/L (ref 38–126)
Anion gap: 4 — ABNORMAL LOW (ref 5–15)
BUN: 9 mg/dL (ref 8–23)
CO2: 29 mmol/L (ref 22–32)
Calcium: 7.4 mg/dL — ABNORMAL LOW (ref 8.9–10.3)
Chloride: 102 mmol/L (ref 98–111)
Creatinine, Ser: 0.55 mg/dL — ABNORMAL LOW (ref 0.61–1.24)
GFR calc Af Amer: >60 mL/min (ref >60)
GFR calc non Af Amer: >60 mL/min (ref >60)
Glucose, Bld: 83 mg/dL (ref 70–99)
Potassium: 6.2 mmol/L — ABNORMAL HIGH (ref 3.5–5.1)
Sodium: 135 mmol/L (ref 135–145)
Total Bilirubin: 0.7 mg/dL (ref 0.3–1.2)
Total Protein: 5.2 g/dL — ABNORMAL LOW (ref 6.5–8.1)

## 2018-11-14 LAB — URINE CULTURE

## 2018-11-14 LAB — LACTIC ACID, PLASMA: Lactic Acid, Venous: 0.8 mmol/L (ref 0.5–1.9)

## 2018-11-14 LAB — HEPATITIS PANEL, ACUTE
HCV Ab: 0.7 s/co ratio (ref 0.0–0.9)
Hep A IgM: NEGATIVE
Hep B C IgM: NEGATIVE
Hepatitis B Surface Ag: NEGATIVE

## 2018-11-14 LAB — AMMONIA: Ammonia: <9 umol/L — ABNORMAL LOW (ref 9–35)

## 2018-11-14 LAB — CK: Total CK: 6831 U/L — ABNORMAL HIGH (ref 49–397)

## 2018-11-14 LAB — MAGNESIUM: Magnesium: 1.8 mg/dL (ref 1.7–2.4)

## 2018-11-14 MED ORDER — HYDRALAZINE HCL 20 MG/ML IJ SOLN
10.0000 mg | INTRAMUSCULAR | Status: DC | PRN
  Filled 2018-11-14: qty 0.5

## 2018-11-14 MED ORDER — LEVETIRACETAM 500 MG PO TABS
500.0000 mg | ORAL_TABLET | Freq: Two times a day (BID) | ORAL | Status: DC
  Administered 2018-11-14 – 2018-11-24 (×21): 500 mg via ORAL
  Filled 2018-11-14 (×21): qty 1

## 2018-11-14 MED ORDER — SODIUM CHLORIDE 0.45 % IV SOLN
INTRAVENOUS | Status: DC
  Administered 2018-11-14 – 2018-11-15 (×4): via INTRAVENOUS

## 2018-11-14 MED ORDER — FAMOTIDINE 20 MG PO TABS
20.0000 mg | ORAL_TABLET | Freq: Every day | ORAL | Status: DC
  Administered 2018-11-14 – 2018-11-24 (×11): 20 mg via ORAL
  Filled 2018-11-14 (×11): qty 1

## 2018-11-14 NOTE — Progress Notes (Signed)
PROGRESS NOTE    Joshua Mckee  HQI:[id number] DOB: [birth year] DOA: 11/12/2018 PCP: Dettinger, Fransisca Kaufmann, MD   Brief Narrative:  Per HPI: Joshua Mckee a 78 y.o.malewith medical history significant forSBO,prostate cancer, GI bleed, alcohol abuse,seizure disorder who was brought to the ED via EMS after he fell and he was unable to get up, significant other could not help getting up either. He reports his left leg that has been giving him problems just give out.He denies vomiting or loose stools, reports good p.o. intake. Is abdominal pain or distention. Last bowel movement was today. Reports he has not drank alcohol in over a year.  Recenthospitalization 12/27 to 09/21/2018 forsmall bowel obstruction,requiring exploratory laparotomyforsmall bowel resection and lysis of adhesions. Discharge to home with home health, andhas done well from a surgical standpoint.  Patient has been admitted with a fall secondary to chronic left ankle problems, but has incidentally been noted to have severe hypokalemia and hypomagnesemia likely secondary to HCTZ use.  He has also been noted to have rhabdomyolysis from the fall.  Patient appears to have an incidental finding of small bowel obstruction on imaging as well but is otherwise asymptomatic.  PT has recommended SNF placement.  Assessment & Plan:   Active Problems:   Hypokalemia  1. Fall-mechanical, secondary to chronic left ankle problems.  PT evaluation pending with fall precautions.  May also be slightly associated with electrolyte abnormalities. 2. Hypokalemia/hypomagnesemia.  Likely secondary to HCTZ use.    Patient has not been repleted and is actually hyperkalemic at this time.  We will continue to monitor lab work and avoid HCTZ.  Maintain on hydralazine as needed and anticipate use of lisinopril in the future. 3. Rhabdomyolysis secondary to fall.  Continue to monitor CK levels and maintain on aggressive IV fluid hydration.  Avoid  statins. 4. Transaminitis-increasing.  Significant AST elevation consistent with alcoholic liver disease.  Continue to trend with hepatitis panel negative for any acute findings.  Ultrasound negative for stones or cholecystitis, but there are multiple hepatic cysts.  Will check ammonia level. 5. Mild troponin elevation.  Flat trend and likely associated with rhabdomyolysis with no associated chest pain. 6. Small bowel obstruction-incidental finding.  Will advance diet to full this morning as patient is hungry.  He seems to state that he has had a bowel movement in the past 2 days and denies any nausea or vomiting.  Appreciate general surgery evaluation as patient was recently evaluated for the same in early January. 7. Lactic acidosis-improving.  Continues to resolve.  No further reason to check at this point. 8. C7 vertebral lesion-incidental finding.  History of prostate cancer in the past.  Will need to be reevaluated in the outpatient setting. 9. History of prostate cancer.  Follow-up with urology in outpatient setting.  Resume home tamsulosin. 10. History of prior alcohol abuse.  No alcohol intake noted in the past year.  Continue to monitor for any DTs or tremors. 11. History of seizures.    Switch to oral Keppra today.   DVT prophylaxis: SCDs Code Status: Full Family Communication: None at bedside Disposition Plan: IV fluid hydration and treatment of rhabdomyolysis, correction of electrolyte abnormalities, diet advancement.  May transfer to telemetry floor when bed available.   Consultants:   General surgery  Procedures:   None  Antimicrobials:   None   Subjective: Patient seen and evaluated today with no new acute complaints or concerns. No acute concerns or events noted overnight.  He states he continues to have  multiple bowel movements in a day.  He is also passing flatus.  Denies abdominal pain, nausea, or vomiting and has been tolerating his liquid  diet.  Objective: Vitals:   11/14/18 0300 11/14/18 0400 11/14/18 0500 11/14/18 0600  BP: 116/89 113/86 121/90 (!) 123/100  Pulse: 88 83 90 96  Resp: 17 13 16 15   Temp:  97.7 F (36.5 C)    TempSrc:  Oral    SpO2:      Weight:   71.4 kg   Height:        Intake/Output Summary (Last 24 hours) at 11/14/2018 0654 Last data filed at 11/14/2018 0500 Gross per 24 hour  Intake 1849.26 ml  Output 1225 ml  Net 624.26 ml   Filed Weights   11/12/18 1732 11/13/18 0500 11/14/18 0500  Weight: 71.4 kg 73.4 kg 71.4 kg    Examination:  General exam: Appears calm and comfortable  Respiratory system: Clear to auscultation. Respiratory effort normal.  Currently on 2 L nasal cannula. Cardiovascular system: S1 & S2 heard, RRR. No JVD, murmurs, rubs, gallops or clicks. No pedal edema. Gastrointestinal system: Abdomen is nondistended, soft and nontender. No organomegaly or masses felt. Normal bowel sounds heard. Central nervous system: Alert and oriented. No focal neurological deficits. Extremities: Symmetric 5 x 5 power. Skin: No rashes, lesions or ulcers Psychiatry: Difficult to assess.    Data Reviewed: I have personally reviewed following labs and imaging studies  CBC: Recent Labs  Lab 11/12/18 0904 11/14/18 0415  WBC 9.0 7.1  NEUTROABS 7.4  --   HGB 11.9* 11.3*  HCT 36.9* 36.1*  MCV 85.2 86.6  PLT 202 101   Basic Metabolic Panel: Recent Labs  Lab 11/12/18 0904 11/12/18 2230 11/13/18 0507 11/14/18 0415  NA 136  --  137 135  K 2.0* 2.3* 2.8* 6.2*  CL 85*  --  96* 102  CO2 37*  --  33* 29  GLUCOSE 105*  --  75 83  BUN 21  --  13 9  CREATININE 0.75  --  0.53* 0.55*  CALCIUM 7.2*  --  6.9* 7.4*  MG 1.4*  --  1.8 1.8   GFR: Estimated Creatinine Clearance: 65.1 mL/min (A) (by C-G formula based on SCr of 0.55 mg/dL (L)). Liver Function Tests: Recent Labs  Lab 11/12/18 0904 11/13/18 0507 11/14/18 0415  AST 425* 377* 426*  ALT 255* 227* 257*  ALKPHOS 105 91 92   BILITOT 0.9 1.0 0.7  PROT 5.8* 4.9* 5.2*  ALBUMIN 2.4* 2.0* 2.1*   No results for input(s): LIPASE, AMYLASE in the last 168 hours. No results for input(s): AMMONIA in the last 168 hours. Coagulation Profile: No results for input(s): INR, PROTIME in the last 168 hours. Cardiac Enzymes: Recent Labs  Lab 11/12/18 0904 11/12/18 1617 11/12/18 2230 11/13/18 0507 11/14/18 0415  CKTOTAL 7,546*  --   --  7,667* 6,831*  TROPONINI 0.03* 0.03* 0.03*  --   --    BNP (last 3 results) No results for input(s): PROBNP in the last 8760 hours. HbA1C: No results for input(s): HGBA1C in the last 72 hours. CBG: No results for input(s): GLUCAP in the last 168 hours. Lipid Profile: No results for input(s): CHOL, HDL, LDLCALC, TRIG, CHOLHDL, LDLDIRECT in the last 72 hours. Thyroid Function Tests: No results for input(s): TSH, T4TOTAL, FREET4, T3FREE, THYROIDAB in the last 72 hours. Anemia Panel: No results for input(s): VITAMINB12, FOLATE, FERRITIN, TIBC, IRON, RETICCTPCT in the last 72 hours. Sepsis Labs: Recent Labs  Lab 11/12/18 0904 11/12/18 1057 11/14/18 0415  LATICACIDVEN 3.2* 1.3 0.8    Recent Results (from the past 240 hour(s))  MRSA PCR Screening     Status: None   Collection Time: 11/12/18  5:21 PM  Result Value Ref Range Status   MRSA by PCR NEGATIVE NEGATIVE Final    Comment:        The GeneXpert MRSA Assay (FDA approved for NASAL specimens only), is one component of a comprehensive MRSA colonization surveillance program. It is not intended to diagnose MRSA infection nor to guide or monitor treatment for MRSA infections. Performed at [facility], [address], [place], [place] [address]          Radiology Studies: Ct Abdomen Pelvis Wo Contrast  Result Date: 11/12/2018 CLINICAL DATA:  Fall, evaluate possible small bowel obstruction EXAM: CT ABDOMEN AND PELVIS WITHOUT CONTRAST TECHNIQUE: Multidetector CT imaging of the abdomen and pelvis was performed following  the standard protocol without IV contrast. COMPARISON:  CT abdomen pelvis, 09/15/2018 FINDINGS: Lower chest: Bibasilar scarring and irregular opacity, improved compared to prior examination. Hepatobiliary: Numerous small fluid attenuation liver lesions. No gallstones, gallbladder wall thickening, or biliary dilatation. Pancreas: Unremarkable. No pancreatic ductal dilatation or surrounding inflammatory changes. Spleen: Normal in size without focal abnormality. Adrenals/Urinary Tract: Adrenal glands are unremarkable. Kidneys are normal, without renal calculi, focal lesion, or hydronephrosis. Bladder is unremarkable. Stomach/Bowel: Stomach is within normal limits. There is diffuse dilatation of the proximal to mid small bowel, with interval findings of a distal small bowel resection. There appears to be an abrupt caliber change at the anastomosis, in the left lower quadrant (series 2, image 64). Largest loops of bowel measure 3.8 cm. There is gas and stool present in the colon to the rectum. Vascular/Lymphatic: Calcific atherosclerosis. No enlarged abdominal or pelvic lymph nodes. Reproductive: No mass or other abnormality. Other: No abdominal wall hernia or abnormality. No abdominopelvic ascites. Musculoskeletal: No acute or significant osseous findings. IMPRESSION: 1. There is diffuse dilatation of the proximal to mid small bowel, with interval findings of a distal small bowel resection. There appears to be an abrupt caliber change at the anastomosis, in the left lower quadrant (series 2, image 64). Largest loops of bowel measure 3.8 cm. There is gas and stool present in the colon to the rectum. 2.  Other chronic and incidental findings as detailed above. Electronically Signed   By: Eddie Candle M.D.   On: 11/12/2018 15:00   Dg Chest 2 View  Result Date: 11/12/2018 CLINICAL DATA:  Fall at home today.  Weakness. EXAM: CHEST - 2 VIEW COMPARISON:  09/13/2018 FINDINGS: The heart size and mediastinal contours are  within normal limits. Both lungs are clear. The visualized skeletal structures are unremarkable. IMPRESSION: No active cardiopulmonary disease. Electronically Signed   By: Earle Gell M.D.   On: 11/12/2018 10:19   Dg Lumbar Spine Complete  Result Date: 11/12/2018 CLINICAL DATA:  Status post fall with leg pain. EXAM: LUMBAR SPINE - COMPLETE 4+ VIEW COMPARISON:  Body CT 09/15/2018 FINDINGS: There is no evidence of lumbar spine fracture. Alignment is normal. Multilevel osteoarthritic changes. Dilated small bowel loops measure up to 6.2 cm. IMPRESSION: Dilated small bowel loops measure up to 6.2 cm, consistent with small bowel obstruction. No evidence of lumbosacral spine fracture. Electronically Signed   By: Fidela Salisbury M.D.   On: 11/12/2018 10:24   Ct Head Wo Contrast  Result Date: 11/12/2018 CLINICAL DATA:  Patient fell from standing position with left leg  weakness. EXAM: CT HEAD WITHOUT CONTRAST CT CERVICAL SPINE WITHOUT CONTRAST TECHNIQUE: Multidetector CT imaging of the head and cervical spine was performed following the standard protocol without intravenous contrast. Multiplanar CT image reconstructions of the cervical spine were also generated. COMPARISON:  Brain MRI 08/28/2018 FINDINGS: CT HEAD FINDINGS Brain: No evidence of acute infarction, hemorrhage, hydrocephalus, extra-axial collection or mass lesion/mass effect. Lacunar infarct in the right pre central gyrus demonstrated by MRI is not well seen by CT. Remote right thalamic ischemic infarct. Stable brain parenchymal volume loss with ventriculomegaly and mild deep white matter microangiopathy. Vascular: Calcific atherosclerotic disease. Skull: Normal. Negative for fracture or focal lesion. Sinuses/Orbits: No acute finding. Other: None. CT CERVICAL SPINE FINDINGS Alignment: Reversal of cervical lordosis. Skull base and vertebrae: No acute fracture. 12 mm sclerotic lesion within the right transverse process/articular facet of C7 vertebral  body Soft tissues and spinal canal: No prevertebral fluid or swelling. No visible canal hematoma. Small amount of gas within the spinal canal/left neural foramen at L3-L4, likely degenerative. Disc levels:  Multilevel osteoarthritic changes. Upper chest: Negative. Other: None. IMPRESSION: 1. No acute intracranial abnormality. 2. Atrophy, chronic microvascular disease. 3. Lacunar infarct in the right pre central gyrus demonstrated by MRI is not well seen by CT. 4. No evidence of acute traumatic injury to the cervical spine. 5. 12 mm sclerotic lesion within the right transverse process/articular facet of C7 vertebral body. This may represent a bone island versus sclerotic metastatic lesion. Electronically Signed   By: Fidela Salisbury M.D.   On: 11/12/2018 10:38   Ct Cervical Spine Wo Contrast  Result Date: 11/12/2018 CLINICAL DATA:  Patient fell from standing position with left leg weakness. EXAM: CT HEAD WITHOUT CONTRAST CT CERVICAL SPINE WITHOUT CONTRAST TECHNIQUE: Multidetector CT imaging of the head and cervical spine was performed following the standard protocol without intravenous contrast. Multiplanar CT image reconstructions of the cervical spine were also generated. COMPARISON:  Brain MRI 08/28/2018 FINDINGS: CT HEAD FINDINGS Brain: No evidence of acute infarction, hemorrhage, hydrocephalus, extra-axial collection or mass lesion/mass effect. Lacunar infarct in the right pre central gyrus demonstrated by MRI is not well seen by CT. Remote right thalamic ischemic infarct. Stable brain parenchymal volume loss with ventriculomegaly and mild deep white matter microangiopathy. Vascular: Calcific atherosclerotic disease. Skull: Normal. Negative for fracture or focal lesion. Sinuses/Orbits: No acute finding. Other: None. CT CERVICAL SPINE FINDINGS Alignment: Reversal of cervical lordosis. Skull base and vertebrae: No acute fracture. 12 mm sclerotic lesion within the right transverse process/articular facet of  C7 vertebral body Soft tissues and spinal canal: No prevertebral fluid or swelling. No visible canal hematoma. Small amount of gas within the spinal canal/left neural foramen at L3-L4, likely degenerative. Disc levels:  Multilevel osteoarthritic changes. Upper chest: Negative. Other: None. IMPRESSION: 1. No acute intracranial abnormality. 2. Atrophy, chronic microvascular disease. 3. Lacunar infarct in the right pre central gyrus demonstrated by MRI is not well seen by CT. 4. No evidence of acute traumatic injury to the cervical spine. 5. 12 mm sclerotic lesion within the right transverse process/articular facet of C7 vertebral body. This may represent a bone island versus sclerotic metastatic lesion. Electronically Signed   By: Fidela Salisbury M.D.   On: 11/12/2018 10:38   US Abdomen Limited  Result Date: 11/12/2018 CLINICAL DATA:  Elevated liver function tests. EXAM: ULTRASOUND ABDOMEN LIMITED RIGHT UPPER QUADRANT COMPARISON:  CT abdomen and pelvis 09/15/2018. FINDINGS: Gallbladder: The gallbladder is incompletely distended. There is some sludge within the gallbladder. No pericholecystic  fluid. Sonographer reports negative Murphy's sign. No wall thickening. Common bile duct: Diameter: 0.4 cm Liver: Multiple up hepatic cysts are identified as seen on prior CT scan. Echogenicity is mildly increased. Portal vein is patent on color Doppler imaging with normal direction of blood flow towards the liver. IMPRESSION: Mild appearing fatty infiltration of the liver. Small volume of gallbladder sludge. Negative for stones or cholecystitis. Multiple hepatic cysts. Electronically Signed   By: Inge Rise M.D.   On: 11/12/2018 11:34   Dg Hips Bilat W Or Wo Pelvis 5 Views  Result Date: 11/12/2018 CLINICAL DATA:  Status post fall with leg pain. EXAM: DG HIP (WITH OR WITHOUT PELVIS) 5+V BILAT COMPARISON:  None. FINDINGS: There is no evidence of hip fracture or dislocation. There is no evidence of arthropathy or  other focal bone abnormality. IMPRESSION: Negative. Electronically Signed   By: Fidela Salisbury M.D.   On: 11/12/2018 10:20        Scheduled Meds: . pantoprazole  40 mg Oral Daily  . tamsulosin  0.4 mg Oral Daily   Continuous Infusions: . sodium chloride    . famotidine (PEPCID) IV Stopped (11/13/18 1827)  . levETIRAcetam Stopped (11/13/18 2238)     LOS: 2 days    Time spent: 30 minutes    Mavin Dyke Darleen Crocker, DO [facility] Pager [phone]  If 7PM-7AM, please contact night-coverage [email or web address] Password [facility] 11/14/2018, 6:54 AM

## 2018-11-14 NOTE — Progress Notes (Signed)
Subjective: Had multiple bowel movements overnight.  Does have some crampy abdominal pain, which is relieved with bowel movements.  No nausea or vomiting noted.  Objective: Vital signs in last 24 hours: Temp:  [97.5 F (36.4 C)-97.9 F (36.6 C)] 97.7 F (36.5 C) (02/28 0400) Pulse Rate:  [70-100] 96 (02/28 0600) Resp:  [13-23] 15 (02/28 0600) BP: (93-170)/(68-142) 123/100 (02/28 0600) SpO2:  [100 %] 100 % (02/27 1800) Weight:  [71.4 kg] 71.4 kg (02/28 0500) Last BM Date: 11/13/18  Intake/Output from previous day: 02/27 0701 - 02/28 0700 In: 1849.3 [I.V.:1599.3; IV Piggyback:250] Out: 7782 [Urine:1225] Intake/Output this shift: No intake/output data recorded.  General appearance: alert, cooperative and no distress GI: soft, non-tender; bowel sounds normal; no masses,  no organomegaly  Lab Results:  Recent Labs    11/12/18 0904 11/14/18 0415  WBC 9.0 7.1  HGB 11.9* 11.3*  HCT 36.9* 36.1*  PLT 202 186   BMET Recent Labs    11/13/18 0507 11/14/18 0415  NA 137 135  K 2.8* 6.2*  CL 96* 102  CO2 33* 29  GLUCOSE 75 83  BUN 13 9  CREATININE 0.53* 0.55*  CALCIUM 6.9* 7.4*   PT/INR No results for input(s): LABPROT, INR in the last 72 hours.  Studies/Results: Ct Abdomen Pelvis Wo Contrast  Result Date: 11/12/2018 CLINICAL DATA:  Fall, evaluate possible small bowel obstruction EXAM: CT ABDOMEN AND PELVIS WITHOUT CONTRAST TECHNIQUE: Multidetector CT imaging of the abdomen and pelvis was performed following the standard protocol without IV contrast. COMPARISON:  CT abdomen pelvis, 09/15/2018 FINDINGS: Lower chest: Bibasilar scarring and irregular opacity, improved compared to prior examination. Hepatobiliary: Numerous small fluid attenuation liver lesions. No gallstones, gallbladder wall thickening, or biliary dilatation. Pancreas: Unremarkable. No pancreatic ductal dilatation or surrounding inflammatory changes. Spleen: Normal in size without focal abnormality.  Adrenals/Urinary Tract: Adrenal glands are unremarkable. Kidneys are normal, without renal calculi, focal lesion, or hydronephrosis. Bladder is unremarkable. Stomach/Bowel: Stomach is within normal limits. There is diffuse dilatation of the proximal to mid small bowel, with interval findings of a distal small bowel resection. There appears to be an abrupt caliber change at the anastomosis, in the left lower quadrant (series 2, image 64). Largest loops of bowel measure 3.8 cm. There is gas and stool present in the colon to the rectum. Vascular/Lymphatic: Calcific atherosclerosis. No enlarged abdominal or pelvic lymph nodes. Reproductive: No mass or other abnormality. Other: No abdominal wall hernia or abnormality. No abdominopelvic ascites. Musculoskeletal: No acute or significant osseous findings. IMPRESSION: 1. There is diffuse dilatation of the proximal to mid small bowel, with interval findings of a distal small bowel resection. There appears to be an abrupt caliber change at the anastomosis, in the left lower quadrant (series 2, image 64). Largest loops of bowel measure 3.8 cm. There is gas and stool present in the colon to the rectum. 2.  Other chronic and incidental findings as detailed above. Electronically Signed   By: Eddie Candle M.D.   On: 11/12/2018 15:00   Dg Chest 2 View  Result Date: 11/12/2018 CLINICAL DATA:  Fall at home today.  Weakness. EXAM: CHEST - 2 VIEW COMPARISON:  09/13/2018 FINDINGS: The heart size and mediastinal contours are within normal limits. Both lungs are clear. The visualized skeletal structures are unremarkable. IMPRESSION: No active cardiopulmonary disease. Electronically Signed   By: Earle Gell M.D.   On: 11/12/2018 10:19   Dg Lumbar Spine Complete  Result Date: 11/12/2018 CLINICAL DATA:  Status post fall with  leg pain. EXAM: LUMBAR SPINE - COMPLETE 4+ VIEW COMPARISON:  Body CT 09/15/2018 FINDINGS: There is no evidence of lumbar spine fracture. Alignment is normal.  Multilevel osteoarthritic changes. Dilated small bowel loops measure up to 6.2 cm. IMPRESSION: Dilated small bowel loops measure up to 6.2 cm, consistent with small bowel obstruction. No evidence of lumbosacral spine fracture. Electronically Signed   By: Fidela Salisbury M.D.   On: 11/12/2018 10:24   Ct Head Wo Contrast  Result Date: 11/12/2018 CLINICAL DATA:  Patient fell from standing position with left leg weakness. EXAM: CT HEAD WITHOUT CONTRAST CT CERVICAL SPINE WITHOUT CONTRAST TECHNIQUE: Multidetector CT imaging of the head and cervical spine was performed following the standard protocol without intravenous contrast. Multiplanar CT image reconstructions of the cervical spine were also generated. COMPARISON:  Brain MRI 08/28/2018 FINDINGS: CT HEAD FINDINGS Brain: No evidence of acute infarction, hemorrhage, hydrocephalus, extra-axial collection or mass lesion/mass effect. Lacunar infarct in the right pre central gyrus demonstrated by MRI is not well seen by CT. Remote right thalamic ischemic infarct. Stable brain parenchymal volume loss with ventriculomegaly and mild deep white matter microangiopathy. Vascular: Calcific atherosclerotic disease. Skull: Normal. Negative for fracture or focal lesion. Sinuses/Orbits: No acute finding. Other: None. CT CERVICAL SPINE FINDINGS Alignment: Reversal of cervical lordosis. Skull base and vertebrae: No acute fracture. 12 mm sclerotic lesion within the right transverse process/articular facet of C7 vertebral body Soft tissues and spinal canal: No prevertebral fluid or swelling. No visible canal hematoma. Small amount of gas within the spinal canal/left neural foramen at L3-L4, likely degenerative. Disc levels:  Multilevel osteoarthritic changes. Upper chest: Negative. Other: None. IMPRESSION: 1. No acute intracranial abnormality. 2. Atrophy, chronic microvascular disease. 3. Lacunar infarct in the right pre central gyrus demonstrated by MRI is not well seen by CT. 4.  No evidence of acute traumatic injury to the cervical spine. 5. 12 mm sclerotic lesion within the right transverse process/articular facet of C7 vertebral body. This may represent a bone island versus sclerotic metastatic lesion. Electronically Signed   By: Fidela Salisbury M.D.   On: 11/12/2018 10:38   Ct Cervical Spine Wo Contrast  Result Date: 11/12/2018 CLINICAL DATA:  Patient fell from standing position with left leg weakness. EXAM: CT HEAD WITHOUT CONTRAST CT CERVICAL SPINE WITHOUT CONTRAST TECHNIQUE: Multidetector CT imaging of the head and cervical spine was performed following the standard protocol without intravenous contrast. Multiplanar CT image reconstructions of the cervical spine were also generated. COMPARISON:  Brain MRI 08/28/2018 FINDINGS: CT HEAD FINDINGS Brain: No evidence of acute infarction, hemorrhage, hydrocephalus, extra-axial collection or mass lesion/mass effect. Lacunar infarct in the right pre central gyrus demonstrated by MRI is not well seen by CT. Remote right thalamic ischemic infarct. Stable brain parenchymal volume loss with ventriculomegaly and mild deep white matter microangiopathy. Vascular: Calcific atherosclerotic disease. Skull: Normal. Negative for fracture or focal lesion. Sinuses/Orbits: No acute finding. Other: None. CT CERVICAL SPINE FINDINGS Alignment: Reversal of cervical lordosis. Skull base and vertebrae: No acute fracture. 12 mm sclerotic lesion within the right transverse process/articular facet of C7 vertebral body Soft tissues and spinal canal: No prevertebral fluid or swelling. No visible canal hematoma. Small amount of gas within the spinal canal/left neural foramen at L3-L4, likely degenerative. Disc levels:  Multilevel osteoarthritic changes. Upper chest: Negative. Other: None. IMPRESSION: 1. No acute intracranial abnormality. 2. Atrophy, chronic microvascular disease. 3. Lacunar infarct in the right pre central gyrus demonstrated by MRI is not well  seen by CT. 4.  No evidence of acute traumatic injury to the cervical spine. 5. 12 mm sclerotic lesion within the right transverse process/articular facet of C7 vertebral body. This may represent a bone island versus sclerotic metastatic lesion. Electronically Signed   By: Fidela Salisbury M.D.   On: 11/12/2018 10:38   US Abdomen Limited  Result Date: 11/12/2018 CLINICAL DATA:  Elevated liver function tests. EXAM: ULTRASOUND ABDOMEN LIMITED RIGHT UPPER QUADRANT COMPARISON:  CT abdomen and pelvis 09/15/2018. FINDINGS: Gallbladder: The gallbladder is incompletely distended. There is some sludge within the gallbladder. No pericholecystic fluid. Sonographer reports negative Murphy's sign. No wall thickening. Common bile duct: Diameter: 0.4 cm Liver: Multiple up hepatic cysts are identified as seen on prior CT scan. Echogenicity is mildly increased. Portal vein is patent on color Doppler imaging with normal direction of blood flow towards the liver. IMPRESSION: Mild appearing fatty infiltration of the liver. Small volume of gallbladder sludge. Negative for stones or cholecystitis. Multiple hepatic cysts. Electronically Signed   By: Inge Rise M.D.   On: 11/12/2018 11:34   Dg Hips Bilat W Or Wo Pelvis 5 Views  Result Date: 11/12/2018 CLINICAL DATA:  Status post fall with leg pain. EXAM: DG HIP (WITH OR WITHOUT PELVIS) 5+V BILAT COMPARISON:  None. FINDINGS: There is no evidence of hip fracture or dislocation. There is no evidence of arthropathy or other focal bone abnormality. IMPRESSION: Negative. Electronically Signed   By: Fidela Salisbury M.D.   On: 11/12/2018 10:20    Anti-infectives: Anti-infectives (From admission, onward)   None      Assessment/Plan: Impression: No evidence for bowel obstruction at this time.  Initial findings were secondary to hypokalemia.  Tolerating diet well.  Will sign off.  Please call me if I can be of further assistance.  LOS: 2 days    Aviva Signs 11/14/2018

## 2018-11-14 NOTE — Care Management Important Message (Signed)
Important Message  Patient Details  Name: Joshua Mckee MRN: [id number] Date of Birth: March 29, 1941   Medicare Important Message Given:  Yes    [name] 11/14/2018, 4:03 PM

## 2018-11-14 NOTE — Clinical Social Work Placement (Signed)
   CLINICAL SOCIAL WORK PLACEMENT  NOTE  Date:  11/14/2018  Patient Details  Name: Joshua Mckee MRN: [id number] Date of Birth: 11/23/1940  Clinical Social Work is seeking post-discharge placement for this patient at the [place] level of care (*CSW will initial, date and re-position this form in  chart as items are completed):  Yes   Patient/family provided with [facility] Work Department's list of facilities offering this level of care within the geographic area requested by the patient (or if unable, by the patient's family).  Yes   Patient/family informed of their freedom to choose among providers that offer the needed level of care, that participate in Medicare, Medicaid or managed care program needed by the patient, have an available bed and are willing to accept the patient.  Yes   Patient/family informed of [facility]'s ownership interest in [facility] and [facility], as well as of the fact that they are under no obligation to receive care at these facilities.  PASRR submitted to EDS on 11/14/18     PASRR number received on 11/14/18     Existing PASRR number confirmed on       FL2 transmitted to all facilities in geographic area requested by pt/family on 11/14/18     FL2 transmitted to all facilities within larger geographic area on       Patient informed that his/her managed care company has contracts with or will negotiate with certain facilities, including the following:            Patient/family informed of bed offers received.  Patient chooses bed at       Physician recommends and patient chooses bed at      Patient to be transferred to   on  .  Patient to be transferred to facility by       Patient family notified on   of transfer.  Name of family member notified:        PHYSICIAN       Additional Comment:    _______________________________________________ Ihor Gully, LCSW 11/14/2018, 1:43 PM

## 2018-11-14 NOTE — Clinical Social Work Note (Signed)
After much conversing with patient and significant other, patient is now agreeable to go to short term rehab at [facility].  His daughter provided preferences.     Tachina Spoonemore, Clydene Pugh, LCSW

## 2018-11-14 NOTE — NC FL2 (Signed)
[place] MEDICAID FL2 LEVEL OF CARE SCREENING TOOL     IDENTIFICATION  Patient Name: Joshua Mckee Birthdate: December 10, 1940 Sex: male Admission Date (Current Location): 11/12/2018  [facility] and [place] Number:  [employer] and Address:  [place] [address], [place]      Provider Number: [id number]  Attending Physician Name and Address:  Rodena Goldmann, DO  Relative Name and Phone Number:       Current Level of Care: Hospital Recommended Level of Care: [place] Prior Approval Number:    Date Approved/Denied:   PASRR Number: [id number])  Discharge Plan: SNF    Current Diagnoses: Patient Active Problem List   Diagnosis Date Noted  . Hypokalemia 11/12/2018  . Small bowel obstruction due to adhesions ([place])   . AKI (acute kidney injury) ([place]) 09/15/2018  . SBO (small bowel obstruction) ([place]) 09/12/2018  . High transaminase levels 09/12/2018  . History of CVA (cerebrovascular accident) 08/28/2018  . Weakness 08/28/2018  . Overweight (BMI 25.0-29.9) 06/02/2018  . Alcoholism in remission ([place]) 06/13/2017  . Prostate cancer ([place]) 03/06/2017  . Hyperlipidemia 11/30/2016  . Seizure ([place]) 10/15/2016  . History of recent stroke 08/27/2016  . Hypertension 08/27/2016  . Esophageal ulcer 02/27/2016  . GI bleed 12/15/2015  . Reflux esophagitis   . Hiatal hernia     Orientation RESPIRATION BLADDER Height & Weight     Self, Time, Situation, Place  Normal Incontinent Weight: 157 lb 6.5 oz (71.4 kg) Height:  5\' 5"  (165.1 cm)  BEHAVIORAL SYMPTOMS/MOOD NEUROLOGICAL BOWEL NUTRITION STATUS      Continent Diet(see discharge summary)  AMBULATORY STATUS COMMUNICATION OF NEEDS Skin   Extensive Assist Verbally Normal                       Personal Care Assistance Level of Assistance  Bathing, Feeding, Dressing Bathing Assistance: Limited assistance Feeding assistance: Independent Dressing Assistance:  Limited assistance     Functional Limitations Info  Sight, Hearing, Speech Sight Info: Adequate Hearing Info: Adequate Speech Info: Adequate    SPECIAL CARE FACTORS FREQUENCY  PT (By licensed PT)     PT Frequency: 5x/week              Contractures Contractures Info: Not present    Additional Factors Info  Code Status, Allergies Code Status Info: Full Code Allergies Info: NKA           Current Medications (11/14/2018):  This is the current hospital active medication list Current Facility-Administered Medications  Medication Dose Route Frequency Provider Last Rate Last Dose  . 0.45 % sodium chloride infusion   Intravenous Continuous Heath Lark D, DO 150 mL/hr at 11/14/18 0808    . famotidine (PEPCID) tablet 20 mg  20 mg Oral Daily Manuella Ghazi, Pratik D, DO   20 mg at 11/14/18 1313  . hydrALAZINE (APRESOLINE) injection 10 mg  10 mg Intravenous Q4H PRN Manuella Ghazi, Pratik D, DO      . levETIRAcetam (KEPPRA) tablet 500 mg  500 mg Oral BID Manuella Ghazi, Pratik D, DO   500 mg at 11/14/18 4034  . ondansetron (ZOFRAN) tablet 4 mg  4 mg Oral Q6H PRN Emokpae, Ejiroghene E, MD       Or  . ondansetron (ZOFRAN) injection 4 mg  4 mg Intravenous Q6H PRN Emokpae, Ejiroghene E, MD      . pantoprazole (PROTONIX) EC tablet 40 mg  40 mg Oral Daily Manuella Ghazi, Pratik D, DO  40 mg at 11/14/18 0926  . tamsulosin (FLOMAX) capsule 0.4 mg  0.4 mg Oral Daily Manuella Ghazi, Pratik D, DO   0.4 mg at 11/14/18 7169     Discharge Medications: Please see discharge summary for a list of discharge medications.  Relevant Imaging Results:  Relevant Lab Results:   Additional Information SSN [id number] Clydene Pugh, LCSW

## 2018-11-14 NOTE — Progress Notes (Signed)
PHARMACIST - PHYSICIAN COMMUNICATION  DR:  Lanetta Inch CONCERNING: IV- to- Oral Route Change Policy  RECOMMENDATION: This patient is receiving famotidine by the intravenous route.  Based on criteria approved by the Pharmacy and Therapeutics Committee, the intravenous medication(s) is/are being converted to the equivalent oral dose form(s).   DESCRIPTION: These criteria include:  The patient is eating (either orally or via tube) and/or has been taking other orally administered medications for a least 24 hours  The patient has no evidence of active gastrointestinal bleeding or impaired GI absorption (gastrectomy, short bowel, patient on TNA or NPO).  If you have questions about this conversion, please contact the Pharmacy Department  [x]   [phone] )  [facility] []   [phone] )  [facility] []   [phone] )  [facility] []   [phone] )  [facility] []   [phone] )  [place], [facility] 11/14/2018 12:13 PM

## 2018-11-14 NOTE — Clinical Social Work Note (Signed)
Attempted to reach girlfriend by phone to follow up on yesterday's conversation.  No answer, no VM.

## 2018-11-14 NOTE — Progress Notes (Signed)
Physical Therapy Treatment Patient Details Name: Joshua Mckee MRN: [id number] DOB: August 14, 1941 Today's Date: 11/14/2018    History of Present Illness Joshua Mckee is a 78 y.o. male with medical history significant for SBO, prostate cancer, GI bleed, alcohol abuse, seizure disorder who was brought to the ED via EMS after he fell and he was unable to get up, significant other could not help getting up either.  He reports his left leg that has been giving him problems just give out.  He denies vomiting or loose stools, reports good p.o. intake.  Is abdominal pain or distention.  Last bowel movement was today.  Reports he has not drank alcohol in over a year.    PT Comments    Patient required assist to move BLE during bed mobility, unable to come to complete standing or take steps using RW due to BLE weakness,  Required stand pivot to transfer to chair and tolerated sitting up with family member in room after therapy - RN aware.  Patient will benefit from continued physical therapy in hospital and recommended venue below to increase strength, balance, endurance for safe ADLs and gait.   Follow Up Recommendations  SNF     Equipment Recommendations  None recommended by PT    Recommendations for Other Services       Precautions / Restrictions Restrictions Weight Bearing Restrictions: No    Mobility  Bed Mobility Overal bed mobility: Needs Assistance Bed Mobility: Supine to Sit     Supine to sit: Mod assist     General bed mobility comments: increased time, repeated verbal/tactile cueing for proper hand placement  Transfers Overall transfer level: Needs assistance Equipment used: Rolling walker (2 wheeled) Transfers: Sit to/from [employer] Transfers Sit to Stand: Mod assist;Max assist Stand pivot transfers: Max assist       General transfer comment: patient unable to transfer using RW due to BLE weakness, required stand pivot to transfer to chair  Ambulation/Gait                  Stairs             Wheelchair Mobility    Modified Rankin (Stroke Patients Only)       Balance Overall balance assessment: Needs assistance Sitting-balance support: Feet supported;No upper extremity supported Sitting balance-Leahy Scale: Fair     Standing balance support: Bilateral upper extremity supported;During functional activity Standing balance-Leahy Scale: Poor Standing balance comment: using RW                            Cognition Arousal/Alertness: Awake/alert Behavior During Therapy: WFL for tasks assessed/performed Overall Cognitive Status: Within Functional Limits for tasks assessed                                        Exercises General Exercises - Lower Extremity Long Arc Quad: Seated;Strengthening;AROM;Both;10 reps Hip Flexion/Marching: Seated;Strengthening;AROM;Both;10 reps Toe Raises: Seated;Strengthening;AROM;Both;10 reps    General Comments        Pertinent Vitals/Pain Pain Assessment: No/denies pain    Home Living                      Prior Function            PT Goals (current goals can now be found in the care plan section) Acute Rehab PT Goals Patient  Stated Goal: return home with family to assist PT Goal Formulation: With patient/family Time For Goal Achievement: 11/27/18 Potential to Achieve Goals: Good Progress towards PT goals: Progressing toward goals    Frequency    Min 3X/week      PT Plan Current plan remains appropriate    Co-evaluation              AM-PAC PT "6 Clicks" Mobility   Outcome Measure  Help needed turning from your back to your side while in a flat bed without using bedrails?: A Lot Help needed moving from lying on your back to sitting on the side of a flat bed without using bedrails?: A Lot Help needed moving to and from a bed to a chair (including a wheelchair)?: A Lot Help needed standing up from a chair using your arms (e.g.,  wheelchair or bedside chair)?: A Lot Help needed to walk in hospital room?: Total Help needed climbing 3-5 steps with a railing? : Total 6 Click Score: 10    End of Session   Activity Tolerance: Patient tolerated treatment well;Patient limited by fatigue Patient left: in chair;with call bell/phone within reach Nurse Communication: Mobility status PT Visit Diagnosis: Unsteadiness on feet (R26.81);Other abnormalities of gait and mobility (R26.89);Muscle weakness (generalized) (M62.81)     Time: 3846-6599 PT Time Calculation (min) (ACUTE ONLY): 27 min  Charges:  $Therapeutic Exercise: 8-22 mins $Therapeutic Activity: 8-22 mins                     2:29 PM, 11/14/18 [name], MPT Physical Therapist with [facility] [phone] office [phone] mobile phone

## 2018-11-15 LAB — COMPREHENSIVE METABOLIC PANEL
ALBUMIN: 2.1 g/dL — AB (ref 3.5–5.0)
ALK PHOS: 94 U/L (ref 38–126)
ALT: 258 U/L — ABNORMAL HIGH (ref 0–44)
AST: 409 U/L — ABNORMAL HIGH (ref 15–41)
Anion gap: 5 (ref 5–15)
BILIRUBIN TOTAL: 0.8 mg/dL (ref 0.3–1.2)
BUN: 8 mg/dL (ref 8–23)
CALCIUM: 7.7 mg/dL — AB (ref 8.9–10.3)
CO2: 26 mmol/L (ref 22–32)
Chloride: 99 mmol/L (ref 98–111)
Creatinine, Ser: 0.6 mg/dL — ABNORMAL LOW (ref 0.61–1.24)
GFR calc Af Amer: >60 mL/min (ref >60)
GFR calc non Af Amer: >60 mL/min (ref >60)
Glucose, Bld: 86 mg/dL (ref 70–99)
Potassium: 5.9 mmol/L — ABNORMAL HIGH (ref 3.5–5.1)
Sodium: 130 mmol/L — ABNORMAL LOW (ref 135–145)
Total Protein: 5 g/dL — ABNORMAL LOW (ref 6.5–8.1)

## 2018-11-15 LAB — CK: Total CK: 5845 U/L — ABNORMAL HIGH (ref 49–397)

## 2018-11-15 MED ORDER — SODIUM CHLORIDE 0.9 % IV SOLN
INTRAVENOUS | Status: DC
  Administered 2018-11-15 – 2018-11-19 (×15): via INTRAVENOUS

## 2018-11-15 NOTE — Progress Notes (Signed)
PROGRESS NOTE    Joshua Mckee  QTM:[id number] DOB: [birth year] DOA: 11/12/2018 PCP: Dettinger, Fransisca Kaufmann, MD   Brief Narrative:  Per HPI:  Joshua Mckee a 78 y.o.malewith medical history significant forSBO,prostate cancer, GI bleed, alcohol abuse,seizure disorder who was brought to the ED via EMS after he fell and he was unable to get up, significant other could not help getting up either. He reports his left leg that has been giving him problems just give out.He denies vomiting or loose stools, reports good p.o. intake. Is abdominal pain or distention. Last bowel movement was today. Reports he has not drank alcohol in over a year.  Recenthospitalization 12/27 to 09/21/2018 forsmall bowel obstruction,requiring exploratory laparotomyforsmall bowel resection and lysis of adhesions. Discharge to home with home health, andhas done well from a surgical standpoint.  Patient has been admitted with a fall secondary to chronic left ankle problems, but has incidentally been noted to have severe hypokalemia and hypomagnesemia likely secondary to HCTZ use. He has also been noted to have rhabdomyolysis from the fall. Patient appears to have an incidental finding of small bowel obstruction on imaging as well but is otherwise asymptomatic.  PT has recommended SNF placement.   Assessment & Plan:   Active Problems:   Hypokalemia   1. Fall-mechanical, secondary to chronic left ankle problems. PT evaluation pending with fall precautions. May also be slightly associated with electrolyte abnormalities. 2. Hypokalemia/hypomagnesemia. Likely secondary to HCTZ use.   Patient has not been repleted and is actually hyperkalemic at this time.  We will continue to monitor lab work and avoid HCTZ.  Maintain on hydralazine as needed and anticipate use of lisinopril in the future. 3. Rhabdomyolysis secondary to fall. Continue to monitor CK levels and maintain on aggressive IV fluid hydration.  Avoid statins. 4. Transaminitis-increasing.Significant AST elevation consistent with alcoholic liver disease. Continue to trend with hepatitis panel negative for any acute findings. Ultrasound negative for stones or cholecystitis, but there are multiple hepatic cysts.  Will check ammonia level. 5. Mild troponin elevation. Flat trend and likely associated with rhabdomyolysis with no associated chest pain. 6. Small bowel obstruction-incidental finding. Will advance diet to soft this morning as patient is hungry. He seems to state that he has had a bowel movement in the past 2 days and denies any nausea or vomiting. Appreciate general surgery evaluation as patient was recently evaluated for the same in early January. 7. Lactic acidosis-improving.  Continues to resolve.  No further reason to check at this point. 8. C7 vertebral lesion-incidental finding. History of prostate cancer in the past. Will need to be reevaluated in the outpatient setting. 9. History of prostate cancer. Follow-up with urology in outpatient setting. Resume home tamsulosin. 10. History of prior alcohol abuse. No alcohol intake noted in the past year. Continue to monitor for any DTs or tremors. 11. History of seizures.   Switch to oral Keppra today.   DVT prophylaxis:SCDs Code Status:Full Family Communication:None at bedside Disposition Plan:IV fluid hydration and treatmentof rhabdomyolysis, correction of electrolyte abnormalities, diet advancement. May transfer to telemetry floor when bed available.   Consultants:  General surgery  Procedures:  None  Antimicrobials:   None  Subjective: Patient seen and evaluated today with no new acute complaints or concerns. No acute concerns or events noted overnight.  He has continued to have bowel movements and is tolerating his full liquid diet.  Objective: Vitals:   11/15/18 0400 11/15/18 0500 11/15/18 0600 11/15/18 0822  BP: (!) 120/91 117/82  109/85  Pulse:      Resp: 15 16 17  (!) 28  Temp:    (!) 97.5 F (36.4 C)  TempSrc:    Axillary  SpO2:      Weight:  73.6 kg    Height:        Intake/Output Summary (Last 24 hours) at 11/15/2018 0859 Last data filed at 11/15/2018 0700 Gross per 24 hour  Intake 4563.17 ml  Output 2250 ml  Net 2313.17 ml   Filed Weights   11/13/18 0500 11/14/18 0500 11/15/18 0500  Weight: 73.4 kg 71.4 kg 73.6 kg    Examination:  General exam: Appears calm and comfortable  Respiratory system: Clear to auscultation. Respiratory effort normal. Cardiovascular system: S1 & S2 heard, RRR. No JVD, murmurs, rubs, gallops or clicks. No pedal edema. Gastrointestinal system: Abdomen is nondistended, soft and nontender. No organomegaly or masses felt. Normal bowel sounds heard. Central nervous system: Alert and oriented. No focal neurological deficits. Extremities: Symmetric 5 x 5 power. Skin: No rashes, lesions or ulcers Psychiatry: Judgement and insight appear normal. Mood & affect appropriate.     Data Reviewed: I have personally reviewed following labs and imaging studies  CBC: Recent Labs  Lab 11/12/18 0904 11/14/18 0415  WBC 9.0 7.1  NEUTROABS 7.4  --   HGB 11.9* 11.3*  HCT 36.9* 36.1*  MCV 85.2 86.6  PLT 202 509   Basic Metabolic Panel: Recent Labs  Lab 11/12/18 0904 11/12/18 2230 11/13/18 0507 11/14/18 0415 11/15/18 0436  NA 136  --  137 135 130*  K 2.0* 2.3* 2.8* 6.2* 5.9*  CL 85*  --  96* 102 99  CO2 37*  --  33* 29 26  GLUCOSE 105*  --  75 83 86  BUN 21  --  13 9 8   CREATININE 0.75  --  0.53* 0.55* 0.60*  CALCIUM 7.2*  --  6.9* 7.4* 7.7*  MG 1.4*  --  1.8 1.8  --    GFR: Estimated Creatinine Clearance: 65.1 mL/min (A) (by C-G formula based on SCr of 0.6 mg/dL (L)). Liver Function Tests: Recent Labs  Lab 11/12/18 0904 11/13/18 0507 11/14/18 0415 11/15/18 0436  AST 425* 377* 426* 409*  ALT 255* 227* 257* 258*  ALKPHOS 105 91 92 94  BILITOT 0.9 1.0 0.7 0.8    PROT 5.8* 4.9* 5.2* 5.0*  ALBUMIN 2.4* 2.0* 2.1* 2.1*   No results for input(s): LIPASE, AMYLASE in the last 168 hours. Recent Labs  Lab 11/14/18 0819  AMMONIA <9*   Coagulation Profile: No results for input(s): INR, PROTIME in the last 168 hours. Cardiac Enzymes: Recent Labs  Lab 11/12/18 0904 11/12/18 1617 11/12/18 2230 11/13/18 0507 11/14/18 0415 11/15/18 0436  CKTOTAL 7,546*  --   --  7,667* 6,831* 5,845*  TROPONINI 0.03* 0.03* 0.03*  --   --   --    BNP (last 3 results) No results for input(s): PROBNP in the last 8760 hours. HbA1C: No results for input(s): HGBA1C in the last 72 hours. CBG: No results for input(s): GLUCAP in the last 168 hours. Lipid Profile: No results for input(s): CHOL, HDL, LDLCALC, TRIG, CHOLHDL, LDLDIRECT in the last 72 hours. Thyroid Function Tests: No results for input(s): TSH, T4TOTAL, FREET4, T3FREE, THYROIDAB in the last 72 hours. Anemia Panel: No results for input(s): VITAMINB12, FOLATE, FERRITIN, TIBC, IRON, RETICCTPCT in the last 72 hours. Sepsis Labs: Recent Labs  Lab 11/12/18 0904 11/12/18 1057 11/14/18 0415  LATICACIDVEN 3.2* 1.3 0.8    Recent  Results (from the past 240 hour(s))  Urine culture     Status: Abnormal   Collection Time: 11/12/18 11:27 AM  Result Value Ref Range Status   Specimen Description   Final    URINE, CLEAN CATCH Performed at [facility], [address], [place], [place] [address]    Special Requests   Final    NONE Performed at [facility], [address] [place], [place] [address]    Culture MULTIPLE SPECIES PRESENT, SUGGEST RECOLLECTION (A)  Final   Report Status 11/14/2018 FINAL  Final  MRSA PCR Screening     Status: None   Collection Time: 11/12/18  5:21 PM  Result Value Ref Range Status   MRSA by PCR NEGATIVE NEGATIVE Final    Comment:        The GeneXpert MRSA Assay (FDA approved for NASAL specimens only), is one component of a comprehensive MRSA colonization surveillance program. It  is not intended to diagnose MRSA infection nor to guide or monitor treatment for MRSA infections. Performed at [facility], [address] [place], [place] [address]          Radiology Studies: No results found.      Scheduled Meds: . famotidine  20 mg Oral Daily  . levETIRAcetam  500 mg Oral BID  . pantoprazole  40 mg Oral Daily  . tamsulosin  0.4 mg Oral Daily   Continuous Infusions: . sodium chloride       LOS: 3 days    Time spent: 30 minutes    Emeree Mahler Darleen Crocker, DO [facility] Pager [phone]  If 7PM-7AM, please contact night-coverage [email or web address] Password [email or web address] 11/15/2018, 8:59 AM

## 2018-11-16 LAB — COMPREHENSIVE METABOLIC PANEL
ALT: 239 U/L — AB (ref 0–44)
AST: 330 U/L — ABNORMAL HIGH (ref 15–41)
Albumin: 2 g/dL — ABNORMAL LOW (ref 3.5–5.0)
Alkaline Phosphatase: 90 U/L (ref 38–126)
Anion gap: 5 (ref 5–15)
BUN: 8 mg/dL (ref 8–23)
CO2: 26 mmol/L (ref 22–32)
Calcium: 7.6 mg/dL — ABNORMAL LOW (ref 8.9–10.3)
Chloride: 102 mmol/L (ref 98–111)
Creatinine, Ser: 0.53 mg/dL — ABNORMAL LOW (ref 0.61–1.24)
GFR calc Af Amer: >60 mL/min (ref >60)
GFR calc non Af Amer: >60 mL/min (ref >60)
Glucose, Bld: 91 mg/dL (ref 70–99)
Potassium: 4.9 mmol/L (ref 3.5–5.1)
Sodium: 133 mmol/L — ABNORMAL LOW (ref 135–145)
Total Bilirubin: 0.7 mg/dL (ref 0.3–1.2)
Total Protein: 5 g/dL — ABNORMAL LOW (ref 6.5–8.1)

## 2018-11-16 LAB — CK: CK TOTAL: 4491 U/L — AB (ref 49–397)

## 2018-11-16 NOTE — Progress Notes (Signed)
PROGRESS NOTE    Joshua Mckee  XBD:[id number] DOB: 20-Apr-1941 DOA: 11/12/2018 PCP: Dettinger, Fransisca Kaufmann, MD   Brief Narrative:  Per HPI:  Joshua Mckee a 78 y.o.malewith medical history significant forSBO,prostate cancer, GI bleed, alcohol abuse,seizure disorder who was brought to the ED via EMS after he fell and he was unable to get up, significant other could not help getting up either. He reports his left leg that has been giving him problems just give out.He denies vomiting or loose stools, reports good p.o. intake. Is abdominal pain or distention. Last bowel movement was today. Reports he has not drank alcohol in over a year.  Recenthospitalization 12/27 to 09/21/2018 forsmall bowel obstruction,requiring exploratory laparotomyforsmall bowel resection and lysis of adhesions. Discharge to home with home health, andhas done well from a surgical standpoint.  Patient has been admitted with a fall secondary to chronic left ankle problems, but has incidentally been noted to have severe hypokalemia and hypomagnesemia likely secondary to HCTZ use. He has also been noted to have rhabdomyolysis from the fall. Patient appears to have an incidental finding of small bowel obstruction on imaging as well but is otherwise asymptomatic.PT has recommended SNF placement.   Assessment & Plan:   Active Problems:   Hypokalemia  1. Fall-mechanical, secondary to chronic left ankle problems. PT evaluation pending with fall precautions. May also be slightly associated with electrolyte abnormalities. 2. Hypokalemia/hypomagnesemia. Likely secondary to HCTZ use.Patient has not been repleted and is actually hyperkalemic at this time. We will continue to monitor lab work and avoid HCTZ. Maintain on hydralazine as needed and anticipate use of lisinopril in the future. 3. Rhabdomyolysis secondary to fall. Continue to monitor CK levels and maintain on aggressive IV fluid hydration with  rate that will be increased today. Avoid statins. 4. Transaminitis- decreasing.Significant AST elevation consistent with alcoholic liver disease. Continue to trend with hepatitis panelnegative for any acute findings. Ultrasound negative for stones or cholecystitis, but there are multiple hepatic cysts. Ammonia level within normal limits. 5. Mild troponin elevation. Flat trend and likely associated with rhabdomyolysis with no associated chest pain. 6. Small bowel obstruction-incidental finding. Willadvance diet to  heart healthythis morning as patient is hungry. He seems to state that he has had a bowel movement in the past 2 days and denies any nausea or vomiting. Appreciate general surgery evaluation as patient was recently evaluated for the same in early January. 7. Lactic acidosis-improving.Continues to resolve. No further reason to check at this point. 8. C7 vertebral lesion-incidental finding. History of prostate cancer in the past. Will need to be reevaluated in the outpatient setting. 9. History of prostate cancer. Follow-up with urology in outpatient setting. Resume home tamsulosin. 10. History of prior alcohol abuse. No alcohol intake noted in the past year. Continue to monitor for any DTs or tremors. 11. History of seizures.Continue oral Keppra.   DVT prophylaxis:SCDs Code Status:Full Family Communication:None at bedside Disposition Plan:IV fluid hydration and treatmentof rhabdomyolysis, correction of electrolyte abnormalities,diet advancement. May transfer to telemetry floor when bed available.  Plan for transfer to SNF hopefully in the next 24 hours.   Consultants:  General surgery  Procedures:  None  Antimicrobials:   None   Subjective: Patient seen and evaluated today with no new acute complaints or concerns. No acute concerns or events noted overnight.  He has accepted going to hospice tomorrow.  He is tolerating his diet well  with no issues or concerns.  Objective: Vitals:   11/16/18 0400 11/16/18 0500 11/16/18 0600 11/16/18  0700  BP: 106/84 98/83 109/84 117/86  Pulse: 83 86 78 97  Resp: 17 17 16 19   Temp:      TempSrc:      SpO2: 97% 99% 99% 100%  Weight:      Height:        Intake/Output Summary (Last 24 hours) at 11/16/2018 0720 Last data filed at 11/16/2018 0500 Gross per 24 hour  Intake 3601.13 ml  Output 1525 ml  Net 2076.13 ml   Filed Weights   11/13/18 0500 11/14/18 0500 11/15/18 0500  Weight: 73.4 kg 71.4 kg 73.6 kg    Examination:  General exam: Appears calm and comfortable  Respiratory system: Clear to auscultation. Respiratory effort normal. Cardiovascular system: S1 & S2 heard, RRR. No JVD, murmurs, rubs, gallops or clicks. No pedal edema. Gastrointestinal system: Abdomen is nondistended, soft and nontender. No organomegaly or masses felt. Normal bowel sounds heard. Central nervous system: Alert and oriented. No focal neurological deficits. Extremities: Symmetric 5 x 5 power. Skin: No rashes, lesions or ulcers Psychiatry: Judgement and insight appear normal. Mood & affect appropriate.     Data Reviewed: I have personally reviewed following labs and imaging studies  CBC: Recent Labs  Lab 11/12/18 0904 11/14/18 0415  WBC 9.0 7.1  NEUTROABS 7.4  --   HGB 11.9* 11.3*  HCT 36.9* 36.1*  MCV 85.2 86.6  PLT 202 585   Basic Metabolic Panel: Recent Labs  Lab 11/12/18 0904 11/12/18 2230 11/13/18 0507 11/14/18 0415 11/15/18 0436 11/16/18 0405  NA 136  --  137 135 130* 133*  K 2.0* 2.3* 2.8* 6.2* 5.9* 4.9  CL 85*  --  96* 102 99 102  CO2 37*  --  33* 29 26 26   GLUCOSE 105*  --  75 83 86 91  BUN 21  --  13 9 8 8   CREATININE 0.75  --  0.53* 0.55* 0.60* 0.53*  CALCIUM 7.2*  --  6.9* 7.4* 7.7* 7.6*  MG 1.4*  --  1.8 1.8  --   --    GFR: Estimated Creatinine Clearance: 65.1 mL/min (A) (by C-G formula based on SCr of 0.53 mg/dL (L)). Liver Function Tests: Recent Labs  Lab  11/12/18 0904 11/13/18 0507 11/14/18 0415 11/15/18 0436 11/16/18 0405  AST 425* 377* 426* 409* 330*  ALT 255* 227* 257* 258* 239*  ALKPHOS 105 91 92 94 90  BILITOT 0.9 1.0 0.7 0.8 0.7  PROT 5.8* 4.9* 5.2* 5.0* 5.0*  ALBUMIN 2.4* 2.0* 2.1* 2.1* 2.0*   No results for input(s): LIPASE, AMYLASE in the last 168 hours. Recent Labs  Lab 11/14/18 0819  AMMONIA <9*   Coagulation Profile: No results for input(s): INR, PROTIME in the last 168 hours. Cardiac Enzymes: Recent Labs  Lab 11/12/18 0904 11/12/18 1617 11/12/18 2230 11/13/18 0507 11/14/18 0415 11/15/18 0436 11/16/18 0405  CKTOTAL 7,546*  --   --  7,667* 6,831* 5,845* 4,491*  TROPONINI 0.03* 0.03* 0.03*  --   --   --   --    BNP (last 3 results) No results for input(s): PROBNP in the last 8760 hours. HbA1C: No results for input(s): HGBA1C in the last 72 hours. CBG: No results for input(s): GLUCAP in the last 168 hours. Lipid Profile: No results for input(s): CHOL, HDL, LDLCALC, TRIG, CHOLHDL, LDLDIRECT in the last 72 hours. Thyroid Function Tests: No results for input(s): TSH, T4TOTAL, FREET4, T3FREE, THYROIDAB in the last 72 hours. Anemia Panel: No results for input(s): VITAMINB12, FOLATE, FERRITIN, TIBC,  IRON, RETICCTPCT in the last 72 hours. Sepsis Labs: Recent Labs  Lab 11/12/18 6295 11/12/18 1057 11/14/18 0415  LATICACIDVEN 3.2* 1.3 0.8    Recent Results (from the past 240 hour(s))  Urine culture     Status: Abnormal   Collection Time: 11/12/18 11:27 AM  Result Value Ref Range Status   Specimen Description   Final    URINE, CLEAN CATCH Performed at [facility], [address] [place], [place] [address]    Special Requests   Final    NONE Performed at [facility], [address], [place], [place] [address]    Culture MULTIPLE SPECIES PRESENT, SUGGEST RECOLLECTION (A)  Final   Report Status 11/14/2018 FINAL  Final  MRSA PCR Screening     Status: None   Collection Time: 11/12/18  5:21 PM  Result  Value Ref Range Status   MRSA by PCR NEGATIVE NEGATIVE Final    Comment:        The GeneXpert MRSA Assay (FDA approved for NASAL specimens only), is one component of a comprehensive MRSA colonization surveillance program. It is not intended to diagnose MRSA infection nor to guide or monitor treatment for MRSA infections. Performed at [facility], [address] [place], [place] [address]          Radiology Studies: No results found.      Scheduled Meds: . famotidine  20 mg Oral Daily  . levETIRAcetam  500 mg Oral BID  . pantoprazole  40 mg Oral Daily  . tamsulosin  0.4 mg Oral Daily   Continuous Infusions: . sodium chloride 150 mL/hr at 11/16/18 0703     LOS: 4 days    Time spent: 30 minutes    Deneane Stifter Darleen Crocker, DO [facility] Pager [phone]  If 7PM-7AM, please contact night-coverage [email or web address] Password [email or web address] 11/16/2018, 7:20 AM

## 2018-11-17 LAB — COMPREHENSIVE METABOLIC PANEL
ALT: 203 U/L — AB (ref 0–44)
AST: 268 U/L — ABNORMAL HIGH (ref 15–41)
Albumin: 1.8 g/dL — ABNORMAL LOW (ref 3.5–5.0)
Alkaline Phosphatase: 77 U/L (ref 38–126)
Anion gap: 3 — ABNORMAL LOW (ref 5–15)
BUN: 10 mg/dL (ref 8–23)
CO2: 26 mmol/L (ref 22–32)
Calcium: 7 mg/dL — ABNORMAL LOW (ref 8.9–10.3)
Chloride: 106 mmol/L (ref 98–111)
Creatinine, Ser: 0.56 mg/dL — ABNORMAL LOW (ref 0.61–1.24)
GFR calc Af Amer: >60 mL/min (ref >60)
GFR calc non Af Amer: >60 mL/min (ref >60)
Glucose, Bld: 91 mg/dL (ref 70–99)
Potassium: 4.9 mmol/L (ref 3.5–5.1)
Sodium: 135 mmol/L (ref 135–145)
Total Bilirubin: 0.5 mg/dL (ref 0.3–1.2)
Total Protein: 4.6 g/dL — ABNORMAL LOW (ref 6.5–8.1)

## 2018-11-17 LAB — CBC
HCT: 31.5 % — ABNORMAL LOW (ref 39.0–52.0)
Hemoglobin: 9.9 g/dL — ABNORMAL LOW (ref 13.0–17.0)
MCH: 27.4 pg (ref 26.0–34.0)
MCHC: 31.4 g/dL (ref 30.0–36.0)
MCV: 87.3 fL (ref 80.0–100.0)
Platelets: 168 10*3/uL (ref 150–400)
RBC: 3.61 MIL/uL — ABNORMAL LOW (ref 4.22–5.81)
RDW: 14.9 % (ref 11.5–15.5)
WBC: 6.5 10*3/uL (ref 4.0–10.5)
nRBC: 0 % (ref 0.0–0.2)

## 2018-11-17 LAB — CK: Total CK: 4499 U/L — ABNORMAL HIGH (ref 49–397)

## 2018-11-17 NOTE — Progress Notes (Signed)
Physical Therapy Treatment Patient Details Name: Joshua Mckee MRN: [id number] DOB: [birth year] Today's Date: 11/17/2018    History of Present Illness Joshua Mckee is a 78 y.o. male with medical history significant for SBO, prostate cancer, GI bleed, alcohol abuse, seizure disorder who was brought to the ED via EMS after he fell and he was unable to get up, significant other could not help getting up either.  He reports his left leg that has been giving him problems just give out.  He denies vomiting or loose stools, reports good p.o. intake.  Is abdominal pain or distention.  Last bowel movement was today.  Reports he has not drank alcohol in over a year.    PT Comments    Patient presents seated in chair (assisted by nursing staff) and agreeable for limited physical therapy secondary to c/o fatigue after sitting up.  Patient demonstrates poor trunk control with frequent leaning backwards when prompted to sit without back support in chair, required active assistance to complete marching in place due to hip flexor weakness, limited to a few side steps and unable to ambulate away from bedside due to c/o fatigue and weakness and requested to go back to bed after therapy.  Patient will benefit from continued physical therapy in hospital and recommended venue below to increase strength, balance, endurance for safe ADLs and gait.    Follow Up Recommendations  SNF     Equipment Recommendations  None recommended by PT    Recommendations for Other Services       Precautions / Restrictions Precautions Precautions: Fall Restrictions Weight Bearing Restrictions: No    Mobility  Bed Mobility Overal bed mobility: Needs Assistance Bed Mobility: Sit to Supine     Supine to sit: Min assist;Mod assist     General bed mobility comments: required assistance to move BLE onto bed  Transfers Overall transfer level: Needs assistance   Transfers: Sit to/from Stand;Stand Pivot Transfers Sit to  Stand: Mod assist Stand pivot transfers: Mod assist;Max assist       General transfer comment: slow labored movement with frequent repeated verbal/tactile cueing for proper hand placement  Ambulation/Gait Ambulation/Gait assistance: Mod assist;Max assist Gait Distance (Feet): 3 Feet Assistive device: Rolling walker (2 wheeled) Gait Pattern/deviations: Decreased step length - right;Decreased step length - left;Decreased stride length Gait velocity: slow   General Gait Details: limited to 3-4 unsteady slow labored side steps at bedside due to weakness   Stairs             Wheelchair Mobility    Modified Rankin (Stroke Patients Only)       Balance Overall balance assessment: Needs assistance Sitting-balance support: Feet supported;No upper extremity supported Sitting balance-Leahy Scale: Poor Sitting balance - Comments: frequent falling/leaning backwards Postural control: Posterior lean Standing balance support: During functional activity;Bilateral upper extremity supported Standing balance-Leahy Scale: Poor Standing balance comment: fair/poor using RW                            Cognition Arousal/Alertness: Awake/alert Behavior During Therapy: WFL for tasks assessed/performed;Anxious Overall Cognitive Status: Within Functional Limits for tasks assessed                                        Exercises General Exercises - Lower Extremity Long Arc Quad: Seated;Strengthening;AROM;Both;10 reps Hip Flexion/Marching: Seated;Strengthening;Both;10 reps;AAROM Toe Raises: Seated;Strengthening;AROM;Both;10 reps Heel  Raises: Seated;AROM;Strengthening;Both;10 reps    General Comments        Pertinent Vitals/Pain Pain Assessment: No/denies pain    Home Living                      Prior Function            PT Goals (current goals can now be found in the care plan section) Acute Rehab PT Goals Patient Stated Goal: return home  with family to assist PT Goal Formulation: With patient/family Time For Goal Achievement: 11/27/18 Potential to Achieve Goals: Good Progress towards PT goals: Progressing toward goals    Frequency    Min 3X/week      PT Plan Current plan remains appropriate    Co-evaluation              AM-PAC PT "6 Clicks" Mobility   Outcome Measure  Help needed turning from your back to your side while in a flat bed without using bedrails?: A Lot Help needed moving from lying on your back to sitting on the side of a flat bed without using bedrails?: A Lot Help needed moving to and from a bed to a chair (including a wheelchair)?: A Lot Help needed standing up from a chair using your arms (e.g., wheelchair or bedside chair)?: A Lot Help needed to walk in hospital room?: A Lot Help needed climbing 3-5 steps with a railing? : Total 6 Click Score: 11    End of Session   Activity Tolerance: Patient tolerated treatment well;Patient limited by fatigue Patient left: in bed;with call bell/phone within reach Nurse Communication: Mobility status PT Visit Diagnosis: Unsteadiness on feet (R26.81);Other abnormalities of gait and mobility (R26.89);Muscle weakness (generalized) (M62.81)     Time: 9480-1655 PT Time Calculation (min) (ACUTE ONLY): 19 min  Charges:  $Therapeutic Activity: 8-22 mins                     2:49 PM, 11/17/18 [name], MPT Physical Therapist with [facility] [phone] office [phone] mobile phone

## 2018-11-17 NOTE — Care Management Important Message (Signed)
Important Message  Patient Details  Name: Joshua Mckee MRN: [id number] Date of Birth: Oct 23, 1940   Medicare Important Message Given:  Yes    [name] 11/17/2018, 12:10 PM

## 2018-11-17 NOTE — Clinical Social Work Note (Signed)
LCSW provided status update to [employer] at [facility].     Zelphia Glover, Clydene Pugh, LCSW

## 2018-11-17 NOTE — Progress Notes (Signed)
PROGRESS NOTE    Joshua Mckee  ZOX:[id number] DOB: [birth year] DOA: 11/12/2018 PCP: Mckee, Joshua Kaufmann, MD   Brief Narrative:  Per HPI:  Joshua Mckee a 78 y.o.malewith medical history significant forSBO,prostate cancer, GI bleed, alcohol abuse,seizure disorder who was brought to the ED via EMS after he fell and he was unable to get up, significant other could not help getting up either. He reports his left leg that has been giving him problems just give out.He denies vomiting or loose stools, reports good p.o. intake. Is abdominal pain or distention. Last bowel movement was today. Reports he has not drank alcohol in over a year.  Recenthospitalization 12/27 to 09/21/2018 forsmall bowel obstruction,requiring exploratory laparotomyforsmall bowel resection and lysis of adhesions. Discharge to home with home health, andhas done well from a surgical standpoint.  Patient has been admitted with a fall secondary to chronic left ankle problems, but has incidentally been noted to have severe hypokalemia and hypomagnesemia likely secondary to HCTZ use. He has also been noted to have rhabdomyolysis from the fall. Patient appears to have an incidental finding of small bowel obstruction on imaging as well but is otherwise asymptomatic.PT has recommended SNF placement.  Assessment & Plan:   Active Problems:   Hypokalemia   1. Fall-mechanical, secondary to chronic left ankle problems. PT evaluation pending with fall precautions. May also be slightly associated with electrolyte abnormalities. 2. Hypokalemia/hypomagnesemia-resolved. Likely secondary to HCTZ use.Patient has not been repleted and is actually hyperkalemic at this time. We will continue to monitor lab work and avoid HCTZ. Maintain on hydralazine as needed and anticipate use of lisinopril in the future. 3. Rhabdomyolysis secondary to fall-persistent. Continue to monitor CK levels and maintain on aggressive IV  fluid hydration with rate that will be increased today to 200 cc/h. Avoid statins. 4. Transaminitis- decreasing.Significant AST elevation consistent with alcoholic liver disease. Continue to trend with hepatitis panelnegative for any acute findings. Ultrasound negative for stones or cholecystitis, but there are multiple hepatic cysts. Ammonia level within normal limits. 5. Mild troponin elevation. Flat trend and likely associated with rhabdomyolysis with no associated chest pain. 6. Small bowel obstruction-incidental finding. [place]vance diet to heart healthythis morning as patient is hungry. He seems to state that he has had a bowel movement in the past 2 days and denies any nausea or vomiting. Appreciate general surgery evaluation as patient was recently evaluated for the same in early January. 7. Lactic acidosis-improving.Continues to resolve. No further reason to check at this point. 8. C7 vertebral lesion-incidental finding. History of prostate cancer in the past. Will need to be reevaluated in the outpatient setting. 9. History of prostate cancer. Follow-up with urology in outpatient setting. Resume home tamsulosin. 10. History of prior alcohol abuse. No alcohol intake noted in the past year. Continue to monitor for any DTs or tremors. 11. History of seizures.Continue oral Keppra.   DVT prophylaxis:SCDs Code Status:Full Family Communication:None at bedside Disposition Plan:IV fluid hydration and treatmentof rhabdomyolysis, correction of electrolyte abnormalities,diet advancement. May transfer to telemetry floor when bed available.  Plan for transfer to SNF hopefully in the next 24-48 hours.   Consultants:  General surgery  Procedures:  None  Antimicrobials:   None  Subjective: Patient seen and evaluated today with no new acute complaints or concerns. No acute concerns or events noted overnight.  He is eating well and is eager to go to  rehab when possible and would like to get up and try and move.  Objective: Vitals:   11/17/18 0200  11/17/18 0300 11/17/18 0400 11/17/18 0500  BP: 98/78 (!) 129/92 124/87 119/84  Pulse: 89 78 75 82  Resp: 15 16 14 17   Temp:   98.5 F (36.9 C)   TempSrc:   Oral   SpO2: 100% 100% 100% 100%  Weight:      Height:        Intake/Output Summary (Last 24 hours) at 11/17/2018 0734 Last data filed at 11/17/2018 0500 Gross per 24 hour  Intake 3148.67 ml  Output 1500 ml  Net 1648.67 ml   Filed Weights   11/13/18 0500 11/14/18 0500 11/15/18 0500  Weight: 73.4 kg 71.4 kg 73.6 kg    Examination:  General exam: Appears calm and comfortable  Respiratory system: Clear to auscultation. Respiratory effort normal. Cardiovascular system: S1 & S2 heard, RRR. No JVD, murmurs, rubs, gallops or clicks. No pedal edema. Gastrointestinal system: Abdomen is nondistended, soft and nontender. No organomegaly or masses felt. Normal bowel sounds heard. Central nervous system: Alert and oriented. No focal neurological deficits. Extremities: Symmetric 5 x 5 power. Skin: No rashes, lesions or ulcers Psychiatry: Judgement and insight appear normal. Mood & affect appropriate.     Data Reviewed: I have personally reviewed following labs and imaging studies  CBC: Recent Labs  Lab 11/12/18 0904 11/14/18 0415 11/17/18 0406  WBC 9.0 7.1 6.5  NEUTROABS 7.4  --   --   HGB 11.9* 11.3* 9.9*  HCT 36.9* 36.1* 31.5*  MCV 85.2 86.6 87.3  PLT 202 186 884   Basic Metabolic Panel: Recent Labs  Lab 11/12/18 0904  11/13/18 0507 11/14/18 0415 11/15/18 0436 11/16/18 0405 11/17/18 0406  NA 136  --  137 135 130* 133* 135  K 2.0*   < > 2.8* 6.2* 5.9* 4.9 4.9  CL 85*  --  96* 102 99 102 106  CO2 37*  --  33* 29 26 26 26   GLUCOSE 105*  --  75 83 86 91 91  BUN 21  --  13 9 8 8 10   CREATININE 0.75  --  0.53* 0.55* 0.60* 0.53* 0.56*  CALCIUM 7.2*  --  6.9* 7.4* 7.7* 7.6* 7.0*  MG 1.4*  --  1.8 1.8  --   --   --     < > = values in this interval not displayed.   GFR: Estimated Creatinine Clearance: 65.1 mL/min (A) (by C-G formula based on SCr of 0.56 mg/dL (L)). Liver Function Tests: Recent Labs  Lab 11/13/18 0507 11/14/18 0415 11/15/18 0436 11/16/18 0405 11/17/18 0406  AST 377* 426* 409* 330* 268*  ALT 227* 257* 258* 239* 203*  ALKPHOS 91 92 94 90 77  BILITOT 1.0 0.7 0.8 0.7 0.5  PROT 4.9* 5.2* 5.0* 5.0* 4.6*  ALBUMIN 2.0* 2.1* 2.1* 2.0* 1.8*   No results for input(s): LIPASE, AMYLASE in the last 168 hours. Recent Labs  Lab 11/14/18 0819  AMMONIA <9*   Coagulation Profile: No results for input(s): INR, PROTIME in the last 168 hours. Cardiac Enzymes: Recent Labs  Lab 11/12/18 0904 11/12/18 1617 11/12/18 2230 11/13/18 0507 11/14/18 0415 11/15/18 0436 11/16/18 0405 11/17/18 0406  CKTOTAL 7,546*  --   --  7,667* 6,831* 5,845* 4,491* 4,499*  TROPONINI 0.03* 0.03* 0.03*  --   --   --   --   --    BNP (last 3 results) No results for input(s): PROBNP in the last 8760 hours. HbA1C: No results for input(s): HGBA1C in the last 72 hours. CBG: No results for  input(s): GLUCAP in the last 168 hours. Lipid Profile: No results for input(s): CHOL, HDL, LDLCALC, TRIG, CHOLHDL, LDLDIRECT in the last 72 hours. Thyroid Function Tests: No results for input(s): TSH, T4TOTAL, FREET4, T3FREE, THYROIDAB in the last 72 hours. Anemia Panel: No results for input(s): VITAMINB12, FOLATE, FERRITIN, TIBC, IRON, RETICCTPCT in the last 72 hours. Sepsis Labs: Recent Labs  Lab 11/12/18 4709 11/12/18 1057 11/14/18 0415  LATICACIDVEN 3.2* 1.3 0.8    Recent Results (from the past 240 hour(s))  Urine culture     Status: Abnormal   Collection Time: 11/12/18 11:27 AM  Result Value Ref Range Status   Specimen Description   Final    URINE, CLEAN CATCH Performed at [facility], [address] [place], [place] [address]    Special Requests   Final    NONE Performed at [facility], [address]  [place], [place] [address]    Culture MULTIPLE SPECIES PRESENT, SUGGEST RECOLLECTION (A)  Final   Report Status 11/14/2018 FINAL  Final  MRSA PCR Screening     Status: None   Collection Time: 11/12/18  5:21 PM  Result Value Ref Range Status   MRSA by PCR NEGATIVE NEGATIVE Final    Comment:        The GeneXpert MRSA Assay (FDA approved for NASAL specimens only), is one component of a comprehensive MRSA colonization surveillance program. It is not intended to diagnose MRSA infection nor to guide or monitor treatment for MRSA infections. Performed at [facility], [address] [place], [place] [address]          Radiology Studies: No results found.      Scheduled Meds: . famotidine  20 mg Oral Daily  . levETIRAcetam  500 mg Oral BID  . pantoprazole  40 mg Oral Daily  . tamsulosin  0.4 mg Oral Daily   Continuous Infusions: . sodium chloride 175 mL/hr at 11/17/18 0358     LOS: 5 days    Time spent: 30 minutes    Laiden Milles Darleen Crocker, DO [facility] Pager [phone]  If 7PM-7AM, please contact night-coverage [email or web address] Password [facility] 11/17/2018, 7:34 AM

## 2018-11-17 NOTE — Plan of Care (Signed)
Discussed with patient plan of care for the evening, pain management and eating food from the outside with some teach back displayed

## 2018-11-18 LAB — RESPIRATORY PANEL BY PCR
Adenovirus: NOT DETECTED
BORDETELLA PERTUSSIS-RVPCR: NOT DETECTED
Chlamydophila pneumoniae: NOT DETECTED
Coronavirus 229E: NOT DETECTED
Coronavirus HKU1: NOT DETECTED
Coronavirus NL63: NOT DETECTED
Coronavirus OC43: NOT DETECTED
INFLUENZA A-RVPPCR: NOT DETECTED
Influenza B: NOT DETECTED
Metapneumovirus: NOT DETECTED
Mycoplasma pneumoniae: NOT DETECTED
Parainfluenza Virus 1: NOT DETECTED
Parainfluenza Virus 2: NOT DETECTED
Parainfluenza Virus 3: NOT DETECTED
Parainfluenza Virus 4: NOT DETECTED
Respiratory Syncytial Virus: NOT DETECTED
Rhinovirus / Enterovirus: NOT DETECTED

## 2018-11-18 LAB — COMPREHENSIVE METABOLIC PANEL
ALT: 200 U/L — ABNORMAL HIGH (ref 0–44)
AST: 264 U/L — ABNORMAL HIGH (ref 15–41)
Albumin: 1.8 g/dL — ABNORMAL LOW (ref 3.5–5.0)
Alkaline Phosphatase: 72 U/L (ref 38–126)
Anion gap: 5 (ref 5–15)
BUN: 11 mg/dL (ref 8–23)
CO2: 22 mmol/L (ref 22–32)
Calcium: 7 mg/dL — ABNORMAL LOW (ref 8.9–10.3)
Chloride: 107 mmol/L (ref 98–111)
Creatinine, Ser: 0.47 mg/dL — ABNORMAL LOW (ref 0.61–1.24)
GFR calc Af Amer: >60 mL/min (ref >60)
GFR calc non Af Amer: >60 mL/min (ref >60)
Glucose, Bld: 73 mg/dL (ref 70–99)
Potassium: 4.2 mmol/L (ref 3.5–5.1)
Sodium: 134 mmol/L — ABNORMAL LOW (ref 135–145)
Total Bilirubin: 0.7 mg/dL (ref 0.3–1.2)
Total Protein: 4.6 g/dL — ABNORMAL LOW (ref 6.5–8.1)

## 2018-11-18 LAB — CK: Total CK: 4785 U/L — ABNORMAL HIGH (ref 49–397)

## 2018-11-18 NOTE — Progress Notes (Signed)
Physical Therapy Treatment Patient Details Name: Joshua Mckee MRN: [id number] DOB: October 17, 1940 Today's Date: 11/18/2018    History of Present Illness Joshua Mckee is a 78 y.o. male with medical history significant for SBO, prostate cancer, GI bleed, alcohol abuse, seizure disorder who was brought to the ED via EMS after he fell and he was unable to get up, significant other could not help getting up either.  He reports his left leg that has been giving him problems just give out.  He denies vomiting or loose stools, reports good p.o. intake.  Is abdominal pain or distention.  Last bowel movement was today.  Reports he has not drank alcohol in over a year.    PT Comments    Patient in lying in bed upon arrival. Patient agreeable to participating in therapy today. Fearful of falling. Complained of Rt ankle not working "too good." Patient required moderate assistance to come from supine to sitting at EOB. Patient had difficult holding upright sitting position requiring minimal assistance due to posterior lean. Patient unable to stand with max assist so bed was elevated and patient able to stand with minimal assistance using RW with verbal and tactile cues for hand placement and sequencing of steps. Patient able to shuffle step for pivot from bed to chair with difficulty unloading one leg to step with the other. Patient limited by fatigue. Performed therapeutic exercises in seated position. Patient unable to maintain upright sitting position to perform exercises.  Patient would continue to benefit from skilled physical therapy in current environment and next venue to continue return to prior function and increase strength, endurance, balance, coordination, and functional mobility and gait skills.     Follow Up Recommendations  SNF     Equipment Recommendations  None recommended by PT    Recommendations for Other Services       Precautions / Restrictions Precautions Precautions:  Fall Restrictions Weight Bearing Restrictions: No    Mobility  Bed Mobility Overal bed mobility: Needs Assistance Bed Mobility: Sit to Supine     Supine to sit: Min assist;Mod assist     General bed mobility comments: required assistance to move BLE  Transfers Overall transfer level: Needs assistance Equipment used: Rolling walker (2 wheeled) Transfers: Sit to/from [facility] Sit to Stand: From elevated surface;Min assist Stand pivot transfers: Mod assist;Max assist       General transfer comment: slow labored movement with frequent repeated verbal/tactile cueing for proper hand placement  Ambulation/Gait Ambulation/Gait assistance: Mod assist;Max assist Gait Distance (Feet): 3 Feet Assistive device: Rolling walker (2 wheeled) Gait Pattern/deviations: Decreased step length - right;Decreased step length - left;Decreased stride length;Step-to pattern;Shuffle Gait velocity: decreased   General Gait Details: limited to 3-4 unsteady slow labored side steps at bedside due to weakness; difficulty unloading to take small, shuffling side steps to pivot   Stairs             Wheelchair Mobility    Modified Rankin (Stroke Patients Only)       Balance Overall balance assessment: Needs assistance Sitting-balance support: Feet supported;No upper extremity supported Sitting balance-Leahy Scale: Poor Sitting balance - Comments: frequent falling/leaning backwards Postural control: Posterior lean Standing balance support: During functional activity;Bilateral upper extremity supported Standing balance-Leahy Scale: Poor Standing balance comment: fair/poor using RW                            Cognition Arousal/Alertness: Awake/alert Behavior During Therapy: WFL for tasks  assessed/performed Overall Cognitive Status: Within Functional Limits for tasks assessed                                 General Comments: somewhat anxious about  standing, transfer and walking - fearful of falling      Exercises General Exercises - Upper Extremity Shoulder Flexion: AROM;Strengthening;Right;10 reps;Seated;AAROM;Left General Exercises - Lower Extremity Long Arc Quad: Seated;Strengthening;AROM;Both;10 reps Hip Flexion/Marching: Seated;Strengthening;Both;10 reps;AAROM Toe Raises: Seated;Strengthening;AROM;Both;10 reps Heel Raises: Seated;AROM;Strengthening;Both;10 reps    General Comments        Pertinent Vitals/Pain Pain Assessment: No/denies pain    Home Living                      Prior Function            PT Goals (current goals can now be found in the care plan section) Acute Rehab PT Goals Patient Stated Goal: return home with family to assist PT Goal Formulation: With patient/family Time For Goal Achievement: 11/27/18 Potential to Achieve Goals: Good Progress towards PT goals: Progressing toward goals    Frequency    Min 3X/week      PT Plan Current plan remains appropriate    Co-evaluation              AM-PAC PT "6 Clicks" Mobility   Outcome Measure  Help needed turning from your back to your side while in a flat bed without using bedrails?: A Lot Help needed moving from lying on your back to sitting on the side of a flat bed without using bedrails?: A Lot Help needed moving to and from a bed to a chair (including a wheelchair)?: A Lot Help needed standing up from a chair using your arms (e.g., wheelchair or bedside chair)?: A Lot Help needed to walk in hospital room?: A Lot Help needed climbing 3-5 steps with a railing? : Total 6 Click Score: 11    End of Session Equipment Utilized During Treatment: Gait belt Activity Tolerance: Patient tolerated treatment well;Patient limited by fatigue Patient left: with call bell/phone within reach;in chair Nurse Communication: Mobility status PT Visit Diagnosis: Unsteadiness on feet (R26.81);Other abnormalities of gait and mobility  (R26.89);Muscle weakness (generalized) (M62.81)     Time: 3154-0086 PT Time Calculation (min) (ACUTE ONLY): 30 min  Charges:  $Therapeutic Exercise: 8-22 mins $Therapeutic Activity: 8-22 mins                     Floria Raveling. Hartnett-Rands, MS, PT Per [place] [phone] 11/18/2018, 1:54 PM

## 2018-11-18 NOTE — Progress Notes (Signed)
PROGRESS NOTE    Joshua Mckee  NOM:[id number] DOB: 12/14/1940 DOA: 11/12/2018 PCP: Dettinger, Fransisca Kaufmann, MD   Brief Narrative:  Per HPI:  Joshua Mckee a 78 y.o.malewith medical history significant forSBO,prostate cancer, GI bleed, alcohol abuse,seizure disorder who was brought to the ED via EMS after he fell and he was unable to get up, significant other could not help getting up either. He reports his left leg that has been giving him problems just give out.He denies vomiting or loose stools, reports good p.o. intake. Is abdominal pain or distention. Last bowel movement was today. Reports he has not drank alcohol in over a year.  Recenthospitalization 12/27 to 09/21/2018 forsmall bowel obstruction,requiring exploratory laparotomyforsmall bowel resection and lysis of adhesions. Discharge to home with home health, andhas done well from a surgical standpoint.  Patient has been admitted with a fall secondary to chronic left ankle problems, but has incidentally been noted to have severe hypokalemia and hypomagnesemia likely secondary to HCTZ use. He has also been noted to have rhabdomyolysis from the fall but this is now worsening despite aggressive IV fluid administration.  His statin has been withheld.  He was also noted to have incidental finding of small bowel obstruction on imaging during admission, but this does not appear to be clinically relevant.  He was seen by general surgery early on, and his diet has been now advanced to full which he is tolerating with no GI symptomatology.  PT has evaluated the patient several days prior with recommendations for SNF/rehab on discharge.  He is eagerly awaiting discharge and would like to go to rehab as soon as he can, but his CK levels continue to remain quite elevated despite aggressive IV fluid administration.   Assessment & Plan:   Active Problems:   Hypokalemia  1. Fall-mechanical, secondary to chronic left ankle problems.  PT evaluation pending with fall precautions. May also be slightly associated with electrolyte abnormalities. 2. Hypokalemia/hypomagnesemia-resolved. Likely secondary to HCTZ use.Patient has not been repleted and is actually hyperkalemic at this time. We will continue to monitor lab work and avoid HCTZ. Maintain on hydralazine as needed and anticipate use of lisinopril in the future. 3. Rhabdomyolysis secondary to fall-persistent. Continue to monitor CK levels and maintain on aggressive IV fluid hydrationwith rate that will be increased today to 250 cc/h. Avoid statins, which he was on during admission.  Ordered respiratory panel as well as HIV, HSV, EBV, and varicella testing to ensure that there are no other viral etiologies that may be contributing in the background to his persistent rhabdomyolysis. 4. Anemia.  Continue follow-up CBC in a.m.  No overt bleeding identified.  Most of this may be delusional from aggressive IV fluid hydration.  Consider anemia panel as needed. 5. Transaminitis-decreasing.Significant AST elevation consistent with alcoholic liver disease. Continue to trend with hepatitis panelnegative for any acute findings. Ultrasound negative for stones or cholecystitis, but there are multiple hepatic cysts.Ammonia level within normal limits.  Continue to follow up on a.m. CMP. 6. Mild troponin elevation. Flat trend and likely associated with rhabdomyolysis with no associated chest pain. 7. Small bowel obstruction-incidental finding. No clinical symptoms noted.  General surgery has signed off.  Continue on current diet. 8. Lactic acidosis-improving.Continues to resolve. No further reason to check at this point. 9. C7 vertebral lesion-incidental finding. History of prostate cancer in the past. Will need to be reevaluated in the outpatient setting. 10. History of prostate cancer. Follow-up with urology in outpatient setting. Resume home tamsulosin. 11. History of  prior alcohol abuse. No alcohol intake noted in the past year. Continue to monitor for any DTs or tremors. 12. History of seizures.Continue oral Keppra.   DVT prophylaxis:SCDs Code Status:Full Family Communication:None at bedside Disposition Plan:IV fluid hydration and treatmentof rhabdomyolysis, with rate increased even further today and follow-up a.m. labs ordered.  Will order respiratory panel as well as varicella testing, HSV, HIV, and EBV to ensure that there are no other viral issues that may be causing his persistent rhabdomyolysis.  Will need transfer to SNF/rehab when ready for discharge.   Consultants:  General surgery  Procedures:  None  Antimicrobials:   None   Subjective: Patient seen and evaluated today with no new acute complaints or concerns. No acute concerns or events noted overnight.  He is eager to go to rehab as soon as possible and is tired of laying in his bed.  He continues to eat quite well with no issues.  Objective: Vitals:   11/18/18 0300 11/18/18 0400 11/18/18 0500 11/18/18 0523  BP: (!) 123/91 (!) 127/94  122/90  Pulse:      Resp: 14 (!) 25 (!) 31 20  Temp:  97.9 F (36.6 C)    TempSrc:  Oral    SpO2:      Weight:   80.4 kg   Height:        Intake/Output Summary (Last 24 hours) at 11/18/2018 0805 Last data filed at 11/18/2018 0500 Gross per 24 hour  Intake 4727.98 ml  Output 1325 ml  Net 3402.98 ml   Filed Weights   11/14/18 0500 11/15/18 0500 11/18/18 0500  Weight: 71.4 kg 73.6 kg 80.4 kg    Examination:  General exam: Appears calm and comfortable  Respiratory system: Clear to auscultation. Respiratory effort normal. Cardiovascular system: S1 & S2 heard, RRR. No JVD, murmurs, rubs, gallops or clicks. No pedal edema. Gastrointestinal system: Abdomen is nondistended, soft and nontender. No organomegaly or masses felt. Normal bowel sounds heard. Central nervous system: Alert and oriented. No focal neurological  deficits. Extremities: Symmetric 5 x 5 power. Skin: No rashes, lesions or ulcers Psychiatry: Judgement and insight appear normal. Mood & affect appropriate.     Data Reviewed: I have personally reviewed following labs and imaging studies  CBC: Recent Labs  Lab 11/12/18 0904 11/14/18 0415 11/17/18 0406  WBC 9.0 7.1 6.5  NEUTROABS 7.4  --   --   HGB 11.9* 11.3* 9.9*  HCT 36.9* 36.1* 31.5*  MCV 85.2 86.6 87.3  PLT 202 186 253   Basic Metabolic Panel: Recent Labs  Lab 11/12/18 0904  11/13/18 0507 11/14/18 0415 11/15/18 0436 11/16/18 0405 11/17/18 0406 11/18/18 0411  NA 136  --  137 135 130* 133* 135 134*  K 2.0*   < > 2.8* 6.2* 5.9* 4.9 4.9 4.2  CL 85*  --  96* 102 99 102 106 107  CO2 37*  --  33* 29 26 26 26 22   GLUCOSE 105*  --  75 83 86 91 91 73  BUN 21  --  13 9 8 8 10 11   CREATININE 0.75  --  0.53* 0.55* 0.60* 0.53* 0.56* 0.47*  CALCIUM 7.2*  --  6.9* 7.4* 7.7* 7.6* 7.0* 7.0*  MG 1.4*  --  1.8 1.8  --   --   --   --    < > = values in this interval not displayed.   GFR: Estimated Creatinine Clearance: 73.2 mL/min (A) (by C-G formula based on SCr of 0.47 mg/dL (  L)). Liver Function Tests: Recent Labs  Lab 11/14/18 0415 11/15/18 0436 11/16/18 0405 11/17/18 0406 11/18/18 0411  AST 426* 409* 330* 268* 264*  ALT 257* 258* 239* 203* 200*  ALKPHOS 92 94 90 77 72  BILITOT 0.7 0.8 0.7 0.5 0.7  PROT 5.2* 5.0* 5.0* 4.6* 4.6*  ALBUMIN 2.1* 2.1* 2.0* 1.8* 1.8*   No results for input(s): LIPASE, AMYLASE in the last 168 hours. Recent Labs  Lab 11/14/18 0819  AMMONIA <9*   Coagulation Profile: No results for input(s): INR, PROTIME in the last 168 hours. Cardiac Enzymes: Recent Labs  Lab 11/12/18 0904 11/12/18 1617 11/12/18 2230  11/14/18 0415 11/15/18 0436 11/16/18 0405 11/17/18 0406 11/18/18 0411  CKTOTAL 7,546*  --   --    < > 6,831* 5,845* 4,491* 4,499* 4,785*  TROPONINI 0.03* 0.03* 0.03*  --   --   --   --   --   --    < > = values in this interval  not displayed.   BNP (last 3 results) No results for input(s): PROBNP in the last 8760 hours. HbA1C: No results for input(s): HGBA1C in the last 72 hours. CBG: No results for input(s): GLUCAP in the last 168 hours. Lipid Profile: No results for input(s): CHOL, HDL, LDLCALC, TRIG, CHOLHDL, LDLDIRECT in the last 72 hours. Thyroid Function Tests: No results for input(s): TSH, T4TOTAL, FREET4, T3FREE, THYROIDAB in the last 72 hours. Anemia Panel: No results for input(s): VITAMINB12, FOLATE, FERRITIN, TIBC, IRON, RETICCTPCT in the last 72 hours. Sepsis Labs: Recent Labs  Lab 11/12/18 2542 11/12/18 1057 11/14/18 0415  LATICACIDVEN 3.2* 1.3 0.8    Recent Results (from the past 240 hour(s))  Urine culture     Status: Abnormal   Collection Time: 11/12/18 11:27 AM  Result Value Ref Range Status   Specimen Description   Final    URINE, CLEAN CATCH Performed at [facility], [address] [place], [place] [address]    Special Requests   Final    NONE Performed at [facility], [address] [place], [place] [address]    Culture MULTIPLE SPECIES PRESENT, SUGGEST RECOLLECTION (A)  Final   Report Status 11/14/2018 FINAL  Final  MRSA PCR Screening     Status: None   Collection Time: 11/12/18  5:21 PM  Result Value Ref Range Status   MRSA by PCR NEGATIVE NEGATIVE Final    Comment:        The GeneXpert MRSA Assay (FDA approved for NASAL specimens only), is one component of a comprehensive MRSA colonization surveillance program. It is not intended to diagnose MRSA infection nor to guide or monitor treatment for MRSA infections. Performed at [facility], [address], [place], [place] [address]          Radiology Studies: No results found.      Scheduled Meds: . famotidine  20 mg Oral Daily  . levETIRAcetam  500 mg Oral BID  . pantoprazole  40 mg Oral Daily  . tamsulosin  0.4 mg Oral Daily   Continuous Infusions: . sodium chloride 200 mL/hr at 11/18/18 0613      LOS: 6 days    Time spent: 30 minutes    Pratik Darleen Crocker, DO [facility] Pager [phone]  If 7PM-7AM, please contact night-coverage [email or web address] Password [email or web address] 11/18/2018, 8:05 AM

## 2018-11-18 NOTE — Telephone Encounter (Signed)
Per chart, he has had hip surgery and may need to follow up with surgeon for therapy orders.  Last office visit with provider here was 09-03-2018.

## 2018-11-19 DIAGNOSIS — T796XXA Traumatic ischemia of muscle, initial encounter: Secondary | ICD-10-CM

## 2018-11-19 LAB — CBC
HCT: 30 % — ABNORMAL LOW (ref 39.0–52.0)
Hemoglobin: 9.3 g/dL — ABNORMAL LOW (ref 13.0–17.0)
MCH: 26.6 pg (ref 26.0–34.0)
MCHC: 31 g/dL (ref 30.0–36.0)
MCV: 85.7 fL (ref 80.0–100.0)
Platelets: 185 10*3/uL (ref 150–400)
RBC: 3.5 MIL/uL — ABNORMAL LOW (ref 4.22–5.81)
RDW: 15.3 % (ref 11.5–15.5)
WBC: 4.6 10*3/uL (ref 4.0–10.5)
nRBC: 0 % (ref 0.0–0.2)

## 2018-11-19 LAB — CK: CK TOTAL: 5709 U/L — AB (ref 49–397)

## 2018-11-19 LAB — COMPREHENSIVE METABOLIC PANEL
ALBUMIN: 1.6 g/dL — AB (ref 3.5–5.0)
ALT: 187 U/L — ABNORMAL HIGH (ref 0–44)
AST: 259 U/L — ABNORMAL HIGH (ref 15–41)
Alkaline Phosphatase: 63 U/L (ref 38–126)
Anion gap: 5 (ref 5–15)
BUN: 9 mg/dL (ref 8–23)
CO2: 22 mmol/L (ref 22–32)
Calcium: 6.7 mg/dL — ABNORMAL LOW (ref 8.9–10.3)
Chloride: 110 mmol/L (ref 98–111)
Creatinine, Ser: 0.4 mg/dL — ABNORMAL LOW (ref 0.61–1.24)
GFR calc Af Amer: >60 mL/min (ref >60)
GFR calc non Af Amer: >60 mL/min (ref >60)
Glucose, Bld: 66 mg/dL — ABNORMAL LOW (ref 70–99)
Potassium: 3.7 mmol/L (ref 3.5–5.1)
Sodium: 137 mmol/L (ref 135–145)
Total Bilirubin: 0.6 mg/dL (ref 0.3–1.2)
Total Protein: 4.3 g/dL — ABNORMAL LOW (ref 6.5–8.1)

## 2018-11-19 LAB — HIV ANTIBODY (ROUTINE TESTING W REFLEX): HIV Screen 4th Generation wRfx: NONREACTIVE

## 2018-11-19 LAB — EPSTEIN BARR VRS(EBV DNA BY PCR)
EBV DNA QN by PCR: NEGATIVE copies/mL
log10 EBV DNA Qn PCR: UNDETERMINED log10 copy/mL

## 2018-11-19 MED ORDER — SODIUM BICARBONATE 8.4 % IV SOLN
INTRAVENOUS | Status: DC
  Administered 2018-11-19 – 2018-11-20 (×3): via INTRAVENOUS
  Filled 2018-11-19 (×6): qty 100

## 2018-11-19 NOTE — Care Management Important Message (Signed)
Important Message  Patient Details  Name: Joshua Mckee MRN: [id number] Date of Birth: Aug 19, 1941   Medicare Important Message Given:  Yes    [name] 11/19/2018, 2:41 PM

## 2018-11-19 NOTE — Progress Notes (Signed)
Physical Therapy Treatment Patient Details Name: Joshua Mckee MRN: [id number] DOB: Jul 22, 1941 Today's Date: 11/19/2018    History of Present Illness Joshua Mckee is a 78 y.o. male with medical history significant for SBO, prostate cancer, GI bleed, alcohol abuse, seizure disorder who was brought to the ED via EMS after he fell and he was unable to get up, significant other could not help getting up either.  He reports his left leg that has been giving him problems just give out.  He denies vomiting or loose stools, reports good p.o. intake.  Is abdominal pain or distention.  Last bowel movement was today.  Reports he has not drank alcohol in over a year.    PT Comments    Pt reluctant to work with therapy originally due to his weakness and fearful therapist could not help him get out of bed.  Pt required assistance to move BLE to edge of bed and tends to reach and pull on person/object rather than use UE to push up.  Max instruction to complete this correctly.  Pt then required assistance from therapist to obtain seated balance and scoot self forward to edge of bed.  Pt able to stand with mod assist from therapist and bed elevation to standing position using walker.  Only able to make 2 short steps laterally toward chair before sitting back onto bed. Therapist then had to pivot transfer to chair.  Attempted seated LAQ while sitting edge of bed, however pt unable to maintain seated balance and lift LE's.  Able to complete these once transferred to chair and had support.  Pt left with call bell and instructed to notify nursing when ready to return to bed.    Follow Up Recommendations  SNF     Equipment Recommendations  None recommended by PT    Recommendations for Other Services       Precautions / Restrictions Precautions Precautions: Fall    Mobility  Bed Mobility Overal bed mobility: Needs Assistance Bed Mobility: Supine to Sit     Supine to sit: Mod assist;Max assist      General bed mobility comments: required assistance to move BLE; pt tends to reach and pull on person/object rather than use UE to push up  Transfers Overall transfer level: Needs assistance Equipment used: Rolling walker (2 wheeled) Transfers: Sit to/from [facility] Sit to Stand: Mod assist;Max assist Stand pivot transfers: Mod assist;Max assist       General transfer comment: pt unable to use UE correctly, unable to move feet once stancding and diff mainatining balance having to sit back down and stand pivot to chair  Ambulation/Gait Ambulation/Gait assistance: Max assist Gait Distance (Feet): 2 Feet Assistive device: Rolling walker (2 wheeled) Gait Pattern/deviations: Decreased step length - right;Decreased step length - left;Decreased stride length;Step-to pattern;Shuffle     General Gait Details: limited to 2 unsteady slow labored side steps at bedside due to weakness; difficulty unloading to take small, shuffling side steps to pivot   Stairs             Wheelchair Mobility    Modified Rankin (Stroke Patients Only)       Balance                                            Cognition  Exercises General Exercises - Lower Extremity Long Arc Quad: Seated;Strengthening;AROM;Both;10 reps    General Comments        Pertinent Vitals/Pain Pain Assessment: No/denies pain    Home Living                      Prior Function            PT Goals (current goals can now be found in the care plan section)      Frequency    Min 3X/week      PT Plan Current plan remains appropriate    Co-evaluation              AM-PAC PT "6 Clicks" Mobility   Outcome Measure  Help needed turning from your back to your side while in a flat bed without using bedrails?: A Lot Help needed moving from lying on your back to sitting on the side of a flat bed  without using bedrails?: A Lot Help needed moving to and from a bed to a chair (including a wheelchair)?: A Lot Help needed standing up from a chair using your arms (e.g., wheelchair or bedside chair)?: A Lot Help needed to walk in hospital room?: Total Help needed climbing 3-5 steps with a railing? : Total 6 Click Score: 10    End of Session Equipment Utilized During Treatment: Gait belt Activity Tolerance: Patient limited by fatigue Patient left: with call bell/phone within reach;in chair Nurse Communication: Mobility status       Time: 1033-1100 PT Time Calculation (min) (ACUTE ONLY): 27 min  Charges:  $Therapeutic Activity: 23-37 mins                     Teena Irani, PTA/CLT [place], Joshua Mckee 11/19/2018, 5:40 PM

## 2018-11-19 NOTE — Progress Notes (Signed)
PROGRESS NOTE    Joshua Mckee  IFO:[id number] DOB: 09/10/1941 DOA: 11/12/2018 PCP: Dettinger, Fransisca Kaufmann, MD   Brief Narrative:  Per HPI:  Joshua Mckee a 78 y.o.malewith medical history significant forSBO,prostate cancer, GI bleed, alcohol abuse,seizure disorder who was brought to the ED via EMS after he fell and he was unable to get up, significant other could not help getting up either. He reports his left leg that has been giving him problems just give out.He denies vomiting or loose stools, reports good p.o. intake. Is abdominal pain or distention. Last bowel movement was today. Reports he has not drank alcohol in over a year.  Recenthospitalization 12/27 to 09/21/2018 forsmall bowel obstruction,requiring exploratory laparotomyforsmall bowel resection and lysis of adhesions. Discharge to home with home health, andhas done well from a surgical standpoint.  Patient has been admitted with a fall secondary to chronic left ankle problems, but has incidentally been noted to have severe hypokalemia and hypomagnesemia likely secondary to HCTZ use. He has also been noted to have rhabdomyolysis from the fall but this is now worsening despite aggressive IV fluid administration.  His statin has been withheld.  He was also noted to have incidental finding of small bowel obstruction on imaging during admission, but this does not appear to be clinically relevant.  He was seen by general surgery early on, and his diet has been now advanced to full which he is tolerating with no GI symptomatology.  PT has evaluated the patient several days prior with recommendations for SNF/rehab on discharge.  He is eagerly awaiting discharge and would like to go to rehab as soon as he can, but his CK levels continue to remain quite elevated despite aggressive IV fluid administration.   Assessment & Plan:   Active Problems:   Hypokalemia  1. Fall-mechanical, secondary to chronic left ankle problems.  PT evaluation pending with fall precautions. May also be slightly associated with electrolyte abnormalities. 2. Hypokalemia/hypomagnesemia-resolved. Likely secondary to HCTZ use.Patient has not been repleted and is actually hyperkalemic at this time. We will continue to monitor lab work and avoid HCTZ. Maintain on hydralazine as needed and anticipate use of lisinopril in the future. 3. Rhabdomyolysis secondary to fall-persistent. - CK levels  remain elevated despite normal saline at 250 mL an hour,, change IV fluid to bicarb drip at 100 mL /hr to alkalinize urine and and hands examination of CKs  Avoid statins, which he was on during admission.  Ordered respiratory panel as well as HIV, HSV, EBV, and varicella testing to ensure that there are no other viral etiologies that may be contributing in the background to his persistent rhabdomyolysis. 4. Anemia.  Continue follow-up CBC in a.m.  No overt bleeding identified.  Most of this may be delusional from aggressive IV fluid hydration.  Consider anemia panel as needed. 5. Transaminitis-decreasing.Significant AST elevation consistent with alcoholic liver disease. Continue to trend with hepatitis panelnegative for any acute findings. Ultrasound negative for stones or cholecystitis, but there are multiple hepatic cysts.Ammonia level within normal limits.  Continue to follow up on a.m. CMP. 6. Mild troponin elevation. Flat trend and likely associated with rhabdomyolysis with no associated chest pain. 7. Small bowel obstruction-incidental finding. No clinical symptoms noted.  General surgery has signed off.  Continue on current diet. 8. Lactic acidosis-improving.Continues to resolve. No further reason to check at this point. 9. C7 vertebral lesion-incidental finding. History of prostate cancer in the past. Will need to be reevaluated in the outpatient setting. 10. History of prostate  cancer. Follow-up with urology in outpatient setting.  Resume home tamsulosin. 11. History of prior alcohol abuse. No alcohol intake noted in the past year. Continue to monitor for any DTs or tremors. 12. History of seizures.Continue oral Keppra.   DVT prophylaxis:SCDs Code Status:Full Family Communication:None at bedside Disposition Plan:IV fluid hydration and treatmentof rhabdomyolysis, with rate increased even further today and follow-up a.m. labs ordered.  Will order respiratory panel as well as varicella testing, HSV, HIV, and EBV to ensure that there are no other viral issues that may be causing his persistent rhabdomyolysis.  Will need transfer to SNF/rehab when ready for discharge.   Consultants:  General surgery  Procedures:  None  Antimicrobials:   None   Subjective:   Patient reluctant to participate in interview, exam today  States he feels fine states he is voiding okay  Objective: Vitals:   11/18/18 1448 11/18/18 2219 11/19/18 0420 11/19/18 1422  BP: 114/75 129/90 (!) 131/95 109/86  Pulse: (!) 103 96 (!) 101 99  Resp: 19 20 20 16   Temp: (!) 97.4 F (36.3 C) (!) 97.5 F (36.4 C) 97.6 F (36.4 C)   TempSrc: Oral Oral Oral   SpO2: 100% 100% 100% 97%  Weight:      Height:        Intake/Output Summary (Last 24 hours) at 11/19/2018 1843 Last data filed at 11/19/2018 1300 Gross per 24 hour  Intake 3204.02 ml  Output 4100 ml  Net -895.98 ml   Filed Weights   11/15/18 0500 11/18/18 0500 11/18/18 1106  Weight: 73.6 kg 80.4 kg 88.3 kg    Examination:  General exam: Appears calm and comfortable  Respiratory system: Clear to auscultation. Respiratory effort normal. Cardiovascular system: S1 & S2 heard, RRR. No JVD, murmurs, rubs, gallops or clicks. No pedal edema. Gastrointestinal system: Abdomen is nondistended, soft and nontender. No organomegaly or masses felt. Normal bowel sounds heard. Central nervous system: Alert and oriented. No focal neurological deficits. Extremities: Symmetric  5 x 5 power. Skin: No rashes, lesions or ulcers Psychiatry: Judgement and insight appear normal. Mood & affect appropriate.     Data Reviewed: I have personally reviewed following labs and imaging studies  CBC: Recent Labs  Lab 11/14/18 0415 11/17/18 0406 11/19/18 0451  WBC 7.1 6.5 4.6  HGB 11.3* 9.9* 9.3*  HCT 36.1* 31.5* 30.0*  MCV 86.6 87.3 85.7  PLT 186 168 660   Basic Metabolic Panel: Recent Labs  Lab 11/13/18 0507 11/14/18 0415 11/15/18 0436 11/16/18 0405 11/17/18 0406 11/18/18 0411 11/19/18 0451  NA 137 135 130* 133* 135 134* 137  K 2.8* 6.2* 5.9* 4.9 4.9 4.2 3.7  CL 96* 102 99 102 106 107 110  CO2 33* 29 26 26 26 22 22   GLUCOSE 75 83 86 91 91 73 66*  BUN 13 9 8 8 10 11 9   CREATININE 0.53* 0.55* 0.60* 0.53* 0.56* 0.47* 0.40*  CALCIUM 6.9* 7.4* 7.7* 7.6* 7.0* 7.0* 6.7*  MG 1.8 1.8  --   --   --   --   --    GFR: Estimated Creatinine Clearance: 76.5 mL/min (A) (by C-G formula based on SCr of 0.4 mg/dL (L)). Liver Function Tests: Recent Labs  Lab 11/15/18 0436 11/16/18 0405 11/17/18 0406 11/18/18 0411 11/19/18 0451  AST 409* 330* 268* 264* 259*  ALT 258* 239* 203* 200* 187*  ALKPHOS 94 90 77 72 63  BILITOT 0.8 0.7 0.5 0.7 0.6  PROT 5.0* 5.0* 4.6* 4.6* 4.3*  ALBUMIN 2.1*  2.0* 1.8* 1.8* 1.6*   No results for input(s): LIPASE, AMYLASE in the last 168 hours. Recent Labs  Lab 11/14/18 0819  AMMONIA <9*   Coagulation Profile: No results for input(s): INR, PROTIME in the last 168 hours. Cardiac Enzymes: Recent Labs  Lab 11/12/18 2230  11/15/18 0436 11/16/18 0405 11/17/18 0406 11/18/18 0411 11/19/18 0451  CKTOTAL  --    < > 5,845* 4,491* 4,499* 4,785* 5,709*  TROPONINI 0.03*  --   --   --   --   --   --    < > = values in this interval not displayed.   BNP (last 3 results) No results for input(s): PROBNP in the last 8760 hours. HbA1C: No results for input(s): HGBA1C in the last 72 hours. CBG: No results for input(s): GLUCAP in the last  168 hours. Lipid Profile: No results for input(s): CHOL, HDL, LDLCALC, TRIG, CHOLHDL, LDLDIRECT in the last 72 hours. Thyroid Function Tests: No results for input(s): TSH, T4TOTAL, FREET4, T3FREE, THYROIDAB in the last 72 hours. Anemia Panel: No results for input(s): VITAMINB12, FOLATE, FERRITIN, TIBC, IRON, RETICCTPCT in the last 72 hours. Sepsis Labs: Recent Labs  Lab 11/14/18 0415  LATICACIDVEN 0.8    Recent Results (from the past 240 hour(s))  Urine culture     Status: Abnormal   Collection Time: 11/12/18 11:27 AM  Result Value Ref Range Status   Specimen Description   Final    URINE, CLEAN CATCH Performed at [facility], [address] [place], [place] [address]    Special Requests   Final    NONE Performed at [facility], [address] [place], [place] [address]    Culture MULTIPLE SPECIES PRESENT, SUGGEST RECOLLECTION (A)  Final   Report Status 11/14/2018 FINAL  Final  MRSA PCR Screening     Status: None   Collection Time: 11/12/18  5:21 PM  Result Value Ref Range Status   MRSA by PCR NEGATIVE NEGATIVE Final    Comment:        The GeneXpert MRSA Assay (FDA approved for NASAL specimens only), is one component of a comprehensive MRSA colonization surveillance program. It is not intended to diagnose MRSA infection nor to guide or monitor treatment for MRSA infections. Performed at [facility], [address] [place], [place] [address]   Respiratory Panel by PCR     Status: None   Collection Time: 11/18/18  8:03 AM  Result Value Ref Range Status   Adenovirus NOT DETECTED NOT DETECTED Final   Coronavirus 229E NOT DETECTED NOT DETECTED Final    Comment: (NOTE) The Coronavirus on the Respiratory Panel, DOES NOT test for the novel  Coronavirus (2019 nCoV)    Coronavirus HKU1 NOT DETECTED NOT DETECTED Final   Coronavirus NL63 NOT DETECTED NOT DETECTED Final   Coronavirus OC43 NOT DETECTED NOT DETECTED Final   Metapneumovirus NOT DETECTED NOT DETECTED Final    Rhinovirus / Enterovirus NOT DETECTED NOT DETECTED Final   Influenza A NOT DETECTED NOT DETECTED Final   Influenza B NOT DETECTED NOT DETECTED Final   Parainfluenza Virus 1 NOT DETECTED NOT DETECTED Final   Parainfluenza Virus 2 NOT DETECTED NOT DETECTED Final   Parainfluenza Virus 3 NOT DETECTED NOT DETECTED Final   Parainfluenza Virus 4 NOT DETECTED NOT DETECTED Final   Respiratory Syncytial Virus NOT DETECTED NOT DETECTED Final   Bordetella pertussis NOT DETECTED NOT DETECTED Final   Chlamydophila pneumoniae NOT DETECTED NOT DETECTED Final   Mycoplasma pneumoniae NOT DETECTED  NOT DETECTED Final    Comment: Performed at [facility], [place] [address], [place], [place] [address]         Radiology Studies: No results found.      Scheduled Meds: . famotidine  20 mg Oral Daily  . levETIRAcetam  500 mg Oral BID  . pantoprazole  40 mg Oral Daily  . tamsulosin  0.4 mg Oral Daily   Continuous Infusions: .  sodium bicarbonate  infusion 1000 mL 100 mL/hr at 11/19/18 1119     LOS: 7 days     Roxan Hockey, MD [facility]   If 7PM-7AM, please contact night-coverage [email or web address] Password [facility] 11/19/2018, 6:43 PM

## 2018-11-20 ENCOUNTER — Other Ambulatory Visit: Payer: Self-pay | Admitting: Family Medicine

## 2018-11-20 LAB — COMPREHENSIVE METABOLIC PANEL
ALT: 197 U/L — AB (ref 0–44)
AST: 258 U/L — ABNORMAL HIGH (ref 15–41)
Albumin: 1.6 g/dL — ABNORMAL LOW (ref 3.5–5.0)
Alkaline Phosphatase: 74 U/L (ref 38–126)
Anion gap: 5 (ref 5–15)
BUN: 9 mg/dL (ref 8–23)
CO2: 26 mmol/L (ref 22–32)
CREATININE: 0.45 mg/dL — AB (ref 0.61–1.24)
Calcium: 6.6 mg/dL — ABNORMAL LOW (ref 8.9–10.3)
Chloride: 104 mmol/L (ref 98–111)
GFR calc Af Amer: >60 mL/min (ref >60)
GFR calc non Af Amer: >60 mL/min (ref >60)
Glucose, Bld: 87 mg/dL (ref 70–99)
Potassium: 3.2 mmol/L — ABNORMAL LOW (ref 3.5–5.1)
Sodium: 135 mmol/L (ref 135–145)
Total Bilirubin: 0.6 mg/dL (ref 0.3–1.2)
Total Protein: 4.3 g/dL — ABNORMAL LOW (ref 6.5–8.1)

## 2018-11-20 LAB — CK: Total CK: 4873 U/L — ABNORMAL HIGH (ref 49–397)

## 2018-11-20 MED ORDER — POLYETHYLENE GLYCOL 3350 17 G PO PACK
17.0000 g | PACK | Freq: Once | ORAL | Status: AC
  Administered 2018-11-20: 17 g via ORAL
  Filled 2018-11-20: qty 1

## 2018-11-20 MED ORDER — SODIUM BICARBONATE 8.4 % IV SOLN
INTRAVENOUS | Status: AC
  Filled 2018-11-20: qty 100

## 2018-11-20 MED ORDER — POTASSIUM CHLORIDE CRYS ER 20 MEQ PO TBCR
40.0000 meq | EXTENDED_RELEASE_TABLET | Freq: Once | ORAL | Status: AC
  Administered 2018-11-20: 40 meq via ORAL
  Filled 2018-11-20: qty 2

## 2018-11-20 MED ORDER — SODIUM BICARBONATE 8.4 % IV SOLN
INTRAVENOUS | Status: DC
  Administered 2018-11-20: 21:00:00 via INTRAVENOUS
  Filled 2018-11-20 (×3): qty 125

## 2018-11-20 MED ORDER — SENNOSIDES-DOCUSATE SODIUM 8.6-50 MG PO TABS
2.0000 | ORAL_TABLET | Freq: Two times a day (BID) | ORAL | Status: DC
  Administered 2018-11-20 – 2018-11-23 (×5): 2 via ORAL
  Filled 2018-11-20 (×5): qty 2

## 2018-11-20 MED ORDER — BISACODYL 10 MG RE SUPP
10.0000 mg | Freq: Once | RECTAL | Status: AC
  Administered 2018-11-20: 10 mg via RECTAL
  Filled 2018-11-20: qty 1

## 2018-11-20 MED ORDER — SODIUM BICARBONATE 8.4 % IV SOLN
INTRAVENOUS | Status: AC
  Filled 2018-11-20: qty 150

## 2018-11-20 NOTE — Progress Notes (Signed)
PROGRESS NOTE    [name]  QAS:[id number] DOB: 10/28/1940 DOA: 11/12/2018 PCP: Dettinger, Fransisca Kaufmann, MD   Brief Narrative:  Per HPI:  Joshua Mckee a 78 y.o.malewith medical history significant forSBO,prostate cancer, GI bleed, alcohol abuse,seizure disorder who was brought to the ED via EMS after he fell and he was unable to get up, significant other could not help getting up either. He reports his left leg that has been giving him problems just give out.He denies vomiting or loose stools, reports good p.o. intake. Is abdominal pain or distention. Last bowel movement was today. Reports he has not drank alcohol in over a year.  Recenthospitalization 12/27 to 09/21/2018 forsmall bowel obstruction,requiring exploratory laparotomyforsmall bowel resection and lysis of adhesions. Discharge to home with home health, andhas done well from a surgical standpoint.  Patient has been admitted with a fall secondary to chronic left ankle problems, but has incidentally been noted to have severe hypokalemia and hypomagnesemia likely secondary to HCTZ use. He has also been noted to have rhabdomyolysis from the fall but this is now worsening despite aggressive IV fluid administration.  His statin has been withheld.  He was also noted to have incidental finding of small bowel obstruction on imaging during admission, but this does not appear to be clinically relevant.  He was seen by general surgery early on, and his diet has been now advanced to full which he is tolerating with no GI symptomatology.  PT has evaluated the patient several days prior with recommendations for SNF/rehab on discharge.  He is eagerly awaiting discharge and would like to go to rehab as soon as he can, but his CK levels continue to remain quite elevated despite aggressive IV fluid administration.   Assessment & Plan:   Active Problems:   Hypokalemia  1. Fall-mechanical, secondary to chronic left ankle problems.  PT evaluation appreciated, recommends SNF rehab.  2. Hypokalemia/hypomagnesemia-resolved. Likely secondary to HCTZ use   3. Rhabdomyolysis secondary to fall-persistent. - CK levels  remained elevated despite normal saline at 250 mL an hour,, changed IV fluid to bicarb drip at 100 mL /hr  On 11/19/18 to alkalinize urine and enhance elimination of CKs, down to 4800 from 5700, increase bicarb fluid to 125 mL an hour  Avoid statins, which he was on during admission.  Ordered respiratory panel as well as HIV, HSV, EBV, and varicella testing to ensure that there are no other viral etiologies that may be contributing in the background to his persistent rhabdomyolysis. 4. Anemia.  Continue follow-up CBC in a.m.  No overt bleeding identified.  Most of this may be delusional from aggressive IV fluid hydration.  Consider anemia panel as needed. 5. Transaminitis-decreasing.Significant AST elevation consistent with alcoholic liver disease. Continue to trend with hepatitis panelnegative for any acute findings. Ultrasound showed fatty liver, but negative for stones or cholecystitis, but there are multiple hepatic cysts.Ammonia level within normal limits.  CT abdomen and pelvis without acute findings  6. mild troponin elevation. Flat trend and likely associated with rhabdomyolysis with no associated chest pain. 7. Small bowel obstruction-incidental finding. No clinical symptoms noted.  General surgery has signed off.  Continue on current diet. 8. Lactic acidosis-improving.Continues to resolve. No further reason to check at this point. 9. C7 vertebral lesion-incidental finding. History of prostate cancer in the past. Will need to be reevaluated in the outpatient setting. 10. History of prostate cancer. Follow-up with urology in outpatient setting. Resume home tamsulosin. 11. History of prior alcohol abuse. No alcohol intake  noted in the past year. Continue to monitor for any DTs or  tremors. 12. History of seizures.Continue oral Keppra.   DVT prophylaxis:SCDs Code Status:Full Family Communication:None at bedside Disposition Plan:IV fluid hydration and treatmentof rhabdomyolysis, with rate increased even further today and follow-up a.m. labs ordered.  Will order respiratory panel as well as varicella testing, HSV, HIV, and EBV to ensure that there are no other viral issues that may be causing his persistent rhabdomyolysis.  Will need transfer to SNF/rehab when ready for discharge.   Consultants:  General surgery  Procedures:  None  Antimicrobials:   None   Subjective:  Constipation concerns discussed with patient is reluctant to try laxatives... no  pain no vomiting  Objective: Vitals:   11/19/18 1933 11/19/18 2217 11/20/18 0552 11/20/18 1328  BP:  (!) 124/93 122/71 117/67  Pulse:  (!) 101 88 (!) 103  Resp:  20 16 16   Temp:  (!) 97.2 F (36.2 C) 97.8 F (36.6 C) 97.7 F (36.5 C)  TempSrc:  Oral Oral Oral  SpO2: 97% 100% 100% 99%  Weight:      Height:        Intake/Output Summary (Last 24 hours) at 11/20/2018 1805 Last data filed at 11/20/2018 1300 Gross per 24 hour  Intake 2008.44 ml  Output 1450 ml  Net 558.44 ml   Filed Weights   11/15/18 0500 11/18/18 0500 11/18/18 1106  Weight: 73.6 kg 80.4 kg 88.3 kg    Examination:  General exam: Appears calm and comfortable  Respiratory system: Clear to auscultation. Respiratory effort normal. Cardiovascular system: S1 & S2 heard, RRR. No JVD, murmurs, rubs, gallops or clicks. No pedal edema. Gastrointestinal system: Abdomen is nondistended, soft and nontender. No organomegaly or masses felt. Normal bowel sounds heard. Central nervous system: Alert and oriented. No focal neurological deficits. Extremities: Symmetric 5 x 5 power. Skin: No rashes, lesions or ulcers Psychiatry: Judgement and insight appear normal. Mood & affect appropriate.    CBC: Recent Labs  Lab  11/14/18 0415 11/17/18 0406 11/19/18 0451  WBC 7.1 6.5 4.6  HGB 11.3* 9.9* 9.3*  HCT 36.1* 31.5* 30.0*  MCV 86.6 87.3 85.7  PLT 186 168 673   Basic Metabolic Panel: Recent Labs  Lab 11/14/18 0415  11/16/18 0405 11/17/18 0406 11/18/18 0411 11/19/18 0451 11/20/18 0425  NA 135   < > 133* 135 134* 137 135  K 6.2*   < > 4.9 4.9 4.2 3.7 3.2*  CL 102   < > 102 106 107 110 104  CO2 29   < > 26 26 22 22 26   GLUCOSE 83   < > 91 91 73 66* 87  BUN 9   < > 8 10 11 9 9   CREATININE 0.55*   < > 0.53* 0.56* 0.47* 0.40* 0.45*  CALCIUM 7.4*   < > 7.6* 7.0* 7.0* 6.7* 6.6*  MG 1.8  --   --   --   --   --   --    < > = values in this interval not displayed.   GFR: Estimated Creatinine Clearance: 76.5 mL/min (A) (by C-G formula based on SCr of 0.45 mg/dL (L)). Liver Function Tests: Recent Labs  Lab 11/16/18 0405 11/17/18 0406 11/18/18 0411 11/19/18 0451 11/20/18 0425  AST 330* 268* 264* 259* 258*  ALT 239* 203* 200* 187* 197*  ALKPHOS 90 77 72 63 74  BILITOT 0.7 0.5 0.7 0.6 0.6  PROT 5.0* 4.6* 4.6* 4.3* 4.3*  ALBUMIN 2.0* 1.8*  1.8* 1.6* 1.6*   No results for input(s): LIPASE, AMYLASE in the last 168 hours. Recent Labs  Lab 11/14/18 0819  AMMONIA <9*   Coagulation Profile: No results for input(s): INR, PROTIME in the last 168 hours. Cardiac Enzymes: Recent Labs  Lab 11/16/18 0405 11/17/18 0406 11/18/18 0411 11/19/18 0451 11/20/18 0425  CKTOTAL 4,491* 4,499* 4,785* 5,709* 4,873*   BNP (last 3 results) No results for input(s): PROBNP in the last 8760 hours. HbA1C: No results for input(s): HGBA1C in the last 72 hours. CBG: No results for input(s): GLUCAP in the last 168 hours. Lipid Profile: No results for input(s): CHOL, HDL, LDLCALC, TRIG, CHOLHDL, LDLDIRECT in the last 72 hours. Thyroid Function Tests: No results for input(s): TSH, T4TOTAL, FREET4, T3FREE, THYROIDAB in the last 72 hours. Anemia Panel: No results for input(s): VITAMINB12, FOLATE, FERRITIN, TIBC,  IRON, RETICCTPCT in the last 72 hours. Sepsis Labs: Recent Labs  Lab 11/14/18 0415  LATICACIDVEN 0.8    Recent Results (from the past 240 hour(s))  Urine culture     Status: Abnormal   Collection Time: 11/12/18 11:27 AM  Result Value Ref Range Status   Specimen Description   Final    URINE, CLEAN CATCH Performed at [facility], [address], [place], [place] [address]    Special Requests   Final    NONE Performed at [facility], [address], [place], [place] [address]    Culture MULTIPLE SPECIES PRESENT, SUGGEST RECOLLECTION (A)  Final   Report Status 11/14/2018 FINAL  Final  MRSA PCR Screening     Status: None   Collection Time: 11/12/18  5:21 PM  Result Value Ref Range Status   MRSA by PCR NEGATIVE NEGATIVE Final    Comment:        The GeneXpert MRSA Assay (FDA approved for NASAL specimens only), is one component of a comprehensive MRSA colonization surveillance program. It is not intended to diagnose MRSA infection nor to guide or monitor treatment for MRSA infections. Performed at [facility], [address] [place], [place] [address]   Respiratory Panel by PCR     Status: None   Collection Time: 11/18/18  8:03 AM  Result Value Ref Range Status   Adenovirus NOT DETECTED NOT DETECTED Final   Coronavirus 229E NOT DETECTED NOT DETECTED Final    Comment: (NOTE) The Coronavirus on the Respiratory Panel, DOES NOT test for the novel  Coronavirus (2019 nCoV)    Coronavirus HKU1 NOT DETECTED NOT DETECTED Final   Coronavirus NL63 NOT DETECTED NOT DETECTED Final   Coronavirus OC43 NOT DETECTED NOT DETECTED Final   Metapneumovirus NOT DETECTED NOT DETECTED Final   Rhinovirus / Enterovirus NOT DETECTED NOT DETECTED Final   Influenza A NOT DETECTED NOT DETECTED Final   Influenza B NOT DETECTED NOT DETECTED Final   Parainfluenza Virus 1 NOT DETECTED NOT DETECTED Final   Parainfluenza Virus 2 NOT DETECTED NOT DETECTED Final   Parainfluenza Virus 3 NOT DETECTED NOT  DETECTED Final   Parainfluenza Virus 4 NOT DETECTED NOT DETECTED Final   Respiratory Syncytial Virus NOT DETECTED NOT DETECTED Final   Bordetella pertussis NOT DETECTED NOT DETECTED Final   Chlamydophila pneumoniae NOT DETECTED NOT DETECTED Final   Mycoplasma pneumoniae NOT DETECTED NOT DETECTED Final    Comment: Performed at [facility], [place] [address], [place],  [address]         Radiology Studies: No results found.   Scheduled Meds: . famotidine  20 mg Oral  Daily  . levETIRAcetam  500 mg Oral BID  . pantoprazole  40 mg Oral Daily  . potassium chloride  40 mEq Oral Once  . senna-docusate  2 tablet Oral BID  . tamsulosin  0.4 mg Oral Daily   Continuous Infusions: .  sodium bicarbonate  infusion 1000 mL       LOS: 8 days     Roxan Hockey, MD [facility]   If 7PM-7AM, please contact night-coverage [email or web address] Password [facility] 11/20/2018, 6:05 PM

## 2018-11-21 DIAGNOSIS — I10 Essential (primary) hypertension: Secondary | ICD-10-CM

## 2018-11-21 DIAGNOSIS — M6282 Rhabdomyolysis: Secondary | ICD-10-CM | POA: Diagnosis present

## 2018-11-21 LAB — COMPREHENSIVE METABOLIC PANEL
ALT: 218 U/L — ABNORMAL HIGH (ref 0–44)
ANION GAP: 7 (ref 5–15)
AST: 272 U/L — ABNORMAL HIGH (ref 15–41)
Albumin: 1.8 g/dL — ABNORMAL LOW (ref 3.5–5.0)
Alkaline Phosphatase: 86 U/L (ref 38–126)
BUN: 10 mg/dL (ref 8–23)
CO2: 30 mmol/L (ref 22–32)
Calcium: 6.5 mg/dL — ABNORMAL LOW (ref 8.9–10.3)
Chloride: 96 mmol/L — ABNORMAL LOW (ref 98–111)
Creatinine, Ser: 0.46 mg/dL — ABNORMAL LOW (ref 0.61–1.24)
GFR calc Af Amer: >60 mL/min (ref >60)
GFR calc non Af Amer: >60 mL/min (ref >60)
Glucose, Bld: 85 mg/dL (ref 70–99)
Potassium: 3.1 mmol/L — ABNORMAL LOW (ref 3.5–5.1)
Sodium: 133 mmol/L — ABNORMAL LOW (ref 135–145)
Total Bilirubin: 0.7 mg/dL (ref 0.3–1.2)
Total Protein: 4.8 g/dL — ABNORMAL LOW (ref 6.5–8.1)

## 2018-11-21 LAB — VARICELLA-ZOSTER BY PCR: Varicella-Zoster, PCR: NEGATIVE

## 2018-11-21 LAB — HSV DNA BY PCR (REFERENCE LAB)

## 2018-11-21 LAB — CK: Total CK: 5181 U/L — ABNORMAL HIGH (ref 49–397)

## 2018-11-21 MED ORDER — POTASSIUM CHLORIDE CRYS ER 20 MEQ PO TBCR
40.0000 meq | EXTENDED_RELEASE_TABLET | Freq: Once | ORAL | Status: AC
  Administered 2018-11-21: 40 meq via ORAL
  Filled 2018-11-21: qty 2

## 2018-11-21 MED ORDER — FUROSEMIDE 10 MG/ML IJ SOLN
40.0000 mg | Freq: Once | INTRAMUSCULAR | Status: AC
  Administered 2018-11-21: 40 mg via INTRAVENOUS
  Filled 2018-11-21: qty 4

## 2018-11-21 MED ORDER — SODIUM BICARBONATE 8.4 % IV SOLN
INTRAVENOUS | Status: DC
  Administered 2018-11-21 – 2018-11-22 (×4): via INTRAVENOUS
  Filled 2018-11-21 (×10): qty 100

## 2018-11-21 NOTE — Progress Notes (Signed)
PROGRESS NOTE    Joshua Mckee  OHY:[id number] DOB: 07/09/1941 DOA: 11/12/2018 PCP: Dettinger, Fransisca Kaufmann, MD   Brief Narrative:  Per HPI:  Joshua Mckee a 78 y.o.malewith medical history significant forSBO,prostate cancer, GI bleed, alcohol abuse,seizure disorder who was brought to the ED via EMS after he fell and he was unable to get up, significant other could not help getting up either. He reports his left leg that has been giving him problems just give out.He denies vomiting or loose stools, reports good p.o. intake. Is abdominal pain or distention. Last bowel movement was today. Reports he has not drank alcohol in over a year.  Recenthospitalization 12/27 to 09/21/2018 forsmall bowel obstruction,requiring exploratory laparotomyforsmall bowel resection and lysis of adhesions. Discharge to home with home health, andhas done well from a surgical standpoint.  Patient has been admitted with a fall secondary to chronic left ankle problems, but has incidentally been noted to have severe hypokalemia and hypomagnesemia likely secondary to HCTZ use. He has also been noted to have rhabdomyolysis from the fall but this is now worsening despite aggressive IV fluid administration.  His statin has been withheld.  He was also noted to have incidental finding of small bowel obstruction on imaging during admission, but this does not appear to be clinically relevant.  He was seen by general surgery early on, and his diet has been now advanced to full which he is tolerating with no GI symptomatology.  PT has evaluated the patient several days prior with recommendations for SNF/rehab on discharge.  He is eagerly awaiting discharge and would like to go to rehab as soon as he can, but his CK levels continue to remain quite elevated despite aggressive IV fluid administration.   Assessment & Plan:   Principal Problem:   Rhabdomyolysis Active Problems:   Hypertension   Seizure (HCC)  Hypokalemia  1. Fall-mechanical, secondary to chronic left ankle problems. PT evaluation appreciated, recommends SNF rehab.  2. Hypokalemia/hypomagnesemia-resolved. Likely secondary to HCTZ use   3. Rhabdomyolysis secondary to fall-persistent. - CK levels  remained elevated despite normal saline at 250 mL an hour,, changed IV fluid to bicarb drip at 100 mL /hr  On 11/19/18 to alkalinize urine and enhance elimination of CKs, down to 4800 from 5700, CK is back above 5100, increase bicarb fluid to 125 mL an hour  Avoid statins, which he was on during admission.  respiratory panel as well as HIV, HSV, EBV, and varicella testing to ensure that there are no other viral etiologies that may be contributing in the background to his persistent rhabdomyolysis. 4. Anemia.  Continue follow-up CBC in a.m.  No overt bleeding identified.  Most of this may be delusional from aggressive IV fluid hydration.  Consider anemia panel as needed. 5. Transaminitis-improved, AST higher than ALT suggesting some degree of alcoholic hepatitis,viral hepatitis panelnegative for any acute findings. Ultrasound showed fatty liver, but negative for stones or cholecystitis, but there are multiple hepatic cysts.Ammonia level within normal limits.  CT abdomen and pelvis without acute findings  6. mild troponin elevation. Flat trend and likely associated with rhabdomyolysis with no associated chest pain. 7. Small bowel obstruction-incidental finding. No clinical symptoms noted.  General surgery has signed off.  Continue on current diet. 8. Lactic acidosis-improving.Continues to resolve. No further reason to check at this point. 9. C7 vertebral lesion-incidental finding. History of prostate cancer in the past. Will need to be reevaluated in the outpatient setting. 10. History of prostate cancer. Follow-up with urology in outpatient  setting.  Continue tamsulosin.   11. History of prior alcohol abuse. No alcohol intake  noted in the past year. No evidence of DTs    12. history of seizures.Continue oral Keppra.   DVT prophylaxis:SCDs Code Status:Full Family Communication:None at bedside Disposition Plan:IV fluid hydration and treatmentof rhabdomyolysis, with rate increased even further today and follow-up a.m. labs ordered.  Will order respiratory panel as well as varicella testing, HSV, HIV, and EBV to ensure that there are no other viral issues that may be causing his persistent rhabdomyolysis.  Will need transfer to SNF/rehab when ready for discharge.   Consultants:  General surgery  Procedures:  None  Antimicrobials:   None   Subjective:  Multiple BMs after laxatives, no new concerns,  Objective: Vitals:   11/20/18 2027 11/21/18 0157 11/21/18 0601 11/21/18 1324  BP:  107/87 115/84 111/80  Pulse:  91 99 (!) 103  Resp:  18 18 18   Temp:  98.2 F (36.8 C) 98.2 F (36.8 C) 97.6 F (36.4 C)  TempSrc:  Oral Oral Oral  SpO2: 95% 97% 99% 99%  Weight:      Height:        Intake/Output Summary (Last 24 hours) at 11/21/2018 1701 Last data filed at 11/21/2018 1300 Gross per 24 hour  Intake 2040 ml  Output 250 ml  Net 1790 ml   Filed Weights   11/15/18 0500 11/18/18 0500 11/18/18 1106  Weight: 73.6 kg 80.4 kg 88.3 kg    Examination:  General exam: Appears calm and comfortable  Respiratory system: Clear to auscultation. Respiratory effort normal. Cardiovascular system: S1 & S2 heard, RRR. No JVD, murmurs, rubs, gallops or clicks. No pedal edema. Gastrointestinal system: Abdomen is nondistended, soft and nontender. No organomegaly or masses felt. Normal bowel sounds heard. Central nervous system: Alert and oriented. No focal neurological deficits. Extremities: Symmetric 5 x 5 power. Skin: No rashes, lesions or ulcers Psychiatry: Judgement and insight appear normal. Mood & affect appropriate.    CBC: Recent Labs  Lab 11/17/18 0406 11/19/18 0451  WBC 6.5 4.6    HGB 9.9* 9.3*  HCT 31.5* 30.0*  MCV 87.3 85.7  PLT 168 017   Basic Metabolic Panel: Recent Labs  Lab 11/17/18 0406 11/18/18 0411 11/19/18 0451 11/20/18 0425 11/21/18 0835  NA 135 134* 137 135 133*  K 4.9 4.2 3.7 3.2* 3.1*  CL 106 107 110 104 96*  CO2 26 22 22 26 30   GLUCOSE 91 73 66* 87 85  BUN 10 11 9 9 10   CREATININE 0.56* 0.47* 0.40* 0.45* 0.46*  CALCIUM 7.0* 7.0* 6.7* 6.6* 6.5*   GFR: Estimated Creatinine Clearance: 76.5 mL/min (A) (by C-G formula based on SCr of 0.46 mg/dL (L)). Liver Function Tests: Recent Labs  Lab 11/17/18 0406 11/18/18 0411 11/19/18 0451 11/20/18 0425 11/21/18 0835  AST 268* 264* 259* 258* 272*  ALT 203* 200* 187* 197* 218*  ALKPHOS 77 72 63 74 86  BILITOT 0.5 0.7 0.6 0.6 0.7  PROT 4.6* 4.6* 4.3* 4.3* 4.8*  ALBUMIN 1.8* 1.8* 1.6* 1.6* 1.8*   No results for input(s): LIPASE, AMYLASE in the last 168 hours. No results for input(s): AMMONIA in the last 168 hours. Coagulation Profile: No results for input(s): INR, PROTIME in the last 168 hours. Cardiac Enzymes: Recent Labs  Lab 11/17/18 0406 11/18/18 0411 11/19/18 0451 11/20/18 0425 11/21/18 0835  CKTOTAL 4,499* 4,785* 5,709* 4,873* 5,181*   BNP (last 3 results) No results for input(s): PROBNP in the last 8760 hours.  HbA1C: No results for input(s): HGBA1C in the last 72 hours. CBG: No results for input(s): GLUCAP in the last 168 hours. Lipid Profile: No results for input(s): CHOL, HDL, LDLCALC, TRIG, CHOLHDL, LDLDIRECT in the last 72 hours. Thyroid Function Tests: No results for input(s): TSH, T4TOTAL, FREET4, T3FREE, THYROIDAB in the last 72 hours. Anemia Panel: No results for input(s): VITAMINB12, FOLATE, FERRITIN, TIBC, IRON, RETICCTPCT in the last 72 hours. Sepsis Labs: No results for input(s): PROCALCITON, LATICACIDVEN in the last 168 hours.  Recent Results (from the past 240 hour(s))  Urine culture     Status: Abnormal   Collection Time: 11/12/18 11:27 AM  Result  Value Ref Range Status   Specimen Description   Final    URINE, CLEAN CATCH Performed at [facility], [address] [place], [place] [address]    Special Requests   Final    NONE Performed at [facility], [address], [place], [place] [address]    Culture MULTIPLE SPECIES PRESENT, SUGGEST RECOLLECTION (A)  Final   Report Status 11/14/2018 FINAL  Final  MRSA PCR Screening     Status: None   Collection Time: 11/12/18  5:21 PM  Result Value Ref Range Status   MRSA by PCR NEGATIVE NEGATIVE Final    Comment:        The GeneXpert MRSA Assay (FDA approved for NASAL specimens only), is one component of a comprehensive MRSA colonization surveillance program. It is not intended to diagnose MRSA infection nor to guide or monitor treatment for MRSA infections. Performed at [facility], [address] [place], [place] [address]   Respiratory Panel by PCR     Status: None   Collection Time: 11/18/18  8:03 AM  Result Value Ref Range Status   Adenovirus NOT DETECTED NOT DETECTED Final   Coronavirus 229E NOT DETECTED NOT DETECTED Final    Comment: (NOTE) The Coronavirus on the Respiratory Panel, DOES NOT test for the novel  Coronavirus (2019 nCoV)    Coronavirus HKU1 NOT DETECTED NOT DETECTED Final   Coronavirus NL63 NOT DETECTED NOT DETECTED Final   Coronavirus OC43 NOT DETECTED NOT DETECTED Final   Metapneumovirus NOT DETECTED NOT DETECTED Final   Rhinovirus / Enterovirus NOT DETECTED NOT DETECTED Final   Influenza A NOT DETECTED NOT DETECTED Final   Influenza B NOT DETECTED NOT DETECTED Final   Parainfluenza Virus 1 NOT DETECTED NOT DETECTED Final   Parainfluenza Virus 2 NOT DETECTED NOT DETECTED Final   Parainfluenza Virus 3 NOT DETECTED NOT DETECTED Final   Parainfluenza Virus 4 NOT DETECTED NOT DETECTED Final   Respiratory Syncytial Virus NOT DETECTED NOT DETECTED Final   Bordetella pertussis NOT DETECTED NOT DETECTED Final   Chlamydophila pneumoniae NOT DETECTED NOT  DETECTED Final   Mycoplasma pneumoniae NOT DETECTED NOT DETECTED Final    Comment: Performed at [facility], [place] [address], [place], [place] [address]         Radiology Studies: No results found.   Scheduled Meds: . famotidine  20 mg Oral Daily  . levETIRAcetam  500 mg Oral BID  . pantoprazole  40 mg Oral Daily  . senna-docusate  2 tablet Oral BID  . tamsulosin  0.4 mg Oral Daily   Continuous Infusions: .  sodium bicarbonate  infusion 1000 mL 125 mL/hr at 11/21/18 0912     LOS: 9 days     Roxan Hockey, MD [facility]   If 7PM-7AM, please contact night-coverage [email or web address] Password [email or web address] 11/21/2018, 5:01 PM

## 2018-11-21 NOTE — Progress Notes (Signed)
Physical Therapy Treatment Patient Details Name: Joshua Mckee MRN: [id number] DOB: 09-22-1940 Today's Date: 11/21/2018    History of Present Illness Joshua Mckee is a 78 y.o. male with medical history significant for SBO, prostate cancer, GI bleed, alcohol abuse, seizure disorder who was brought to the ED via EMS after he fell and he was unable to get up, significant other could not help getting up either.  He reports his left leg that has been giving him problems just give out.  He denies vomiting or loose stools, reports good p.o. intake.  Is abdominal pain or distention.  Last bowel movement was today.  Reports he has not drank alcohol in over a year.    PT Comments    Patient demonstrates slow labored movement for sitting up at bedside with fair return for using BUE to help pull self up, once sitting patient frequently falls  Backwards and requires instruction to hold onto knees with hands and to actively lean to the right to prevent falling backwards and/or to the left.  Patient weaker on the left when attempting exercises while seated at bedside, unable to fully extend right knee against gravity, worse with LLE requiring active assistance to move LLE against gravity.  Patient stood for up to 30-40 seconds before having to sit due to inability to lock knees due to weakness and put back to bed to be cleaned after having a large bowel movement during sit to stands - nursing staff notified.  Patient will benefit from continued physical therapy in hospital and recommended venue below to increase strength, balance, endurance for safe ADLs and gait.   Follow Up Recommendations  SNF     Equipment Recommendations  None recommended by PT    Recommendations for Other Services       Precautions / Restrictions Precautions Precautions: Fall Restrictions Weight Bearing Restrictions: No    Mobility  Bed Mobility Overal bed mobility: Needs Assistance Bed Mobility: Supine to Sit;Sit to Supine      Supine to sit: Mod assist;Max assist Sit to supine: Mod assist;Max assist   General bed mobility comments: requires assistance to move BLE  Transfers Overall transfer level: Needs assistance Equipment used: Rolling walker (2 wheeled) Transfers: Sit to/from Stand Sit to Stand: Max assist         General transfer comment: unable to fully lock knees during sit to stands using RW due to weakness  Ambulation/Gait                 Stairs             Wheelchair Mobility    Modified Rankin (Stroke Patients Only)       Balance Overall balance assessment: Needs assistance Sitting-balance support: Feet supported;Bilateral upper extremity supported Sitting balance-Leahy Scale: Fair Sitting balance - Comments: fair/poor with frequent leaning backwards and to the left Postural control: Posterior lean;Left lateral lean Standing balance support: Bilateral upper extremity supported;During functional activity Standing balance-Leahy Scale: Poor Standing balance comment: using RW                            Cognition Arousal/Alertness: Awake/alert Behavior During Therapy: WFL for tasks assessed/performed;Anxious Overall Cognitive Status: Within Functional Limits for tasks assessed                                        Exercises  General Exercises - Lower Extremity Long Arc Quad: Seated;Strengthening;AROM;Both;10 reps;AAROM Hip Flexion/Marching: Seated;Strengthening;Both;10 reps;AAROM;AROM Heel Raises: Seated;AROM;Strengthening;Both;10 reps;AAROM    General Comments        Pertinent Vitals/Pain Pain Assessment: No/denies pain    Home Living                      Prior Function            PT Goals (current goals can now be found in the care plan section) Acute Rehab PT Goals Patient Stated Goal: return home with family to assist PT Goal Formulation: With patient/family Time For Goal Achievement: 11/27/18 Potential to  Achieve Goals: Good Progress towards PT goals: Progressing toward goals    Frequency    Min 3X/week      PT Plan Current plan remains appropriate    Co-evaluation              AM-PAC PT "6 Clicks" Mobility   Outcome Measure  Help needed turning from your back to your side while in a flat bed without using bedrails?: A Lot Help needed moving from lying on your back to sitting on the side of a flat bed without using bedrails?: A Lot Help needed moving to and from a bed to a chair (including a wheelchair)?: Total Help needed standing up from a chair using your arms (e.g., wheelchair or bedside chair)?: A Lot Help needed to walk in hospital room?: Total Help needed climbing 3-5 steps with a railing? : Total 6 Click Score: 9    End of Session   Activity Tolerance: Patient tolerated treatment well;Patient limited by fatigue Patient left: in bed;with call bell/phone within reach Nurse Communication: Mobility status PT Visit Diagnosis: Unsteadiness on feet (R26.81);Other abnormalities of gait and mobility (R26.89);Muscle weakness (generalized) (M62.81)     Time: 4268-3419 PT Time Calculation (min) (ACUTE ONLY): 32 min  Charges:  $Therapeutic Exercise: 8-22 mins $Therapeutic Activity: 8-22 mins                     3:18 PM, 11/21/18 [name], MPT Physical Therapist with [facility] 336 [phone] office [phone] mobile phone

## 2018-11-21 NOTE — Care Management Important Message (Signed)
Important Message  Patient Details  Name: Joshua Mckee MRN: [id number] Date of Birth: Sep 05, 1941   Medicare Important Message Given:  Yes    [name] 11/21/2018, 2:58 PM

## 2018-11-22 DIAGNOSIS — R569 Unspecified convulsions: Secondary | ICD-10-CM

## 2018-11-22 LAB — COMPREHENSIVE METABOLIC PANEL
ALT: 217 U/L — ABNORMAL HIGH (ref 0–44)
AST: 261 U/L — ABNORMAL HIGH (ref 15–41)
Albumin: 1.7 g/dL — ABNORMAL LOW (ref 3.5–5.0)
Alkaline Phosphatase: 91 U/L (ref 38–126)
Anion gap: 8 (ref 5–15)
BUN: 11 mg/dL (ref 8–23)
CO2: 33 mmol/L — ABNORMAL HIGH (ref 22–32)
Calcium: 6.5 mg/dL — ABNORMAL LOW (ref 8.9–10.3)
Chloride: 91 mmol/L — ABNORMAL LOW (ref 98–111)
Creatinine, Ser: 0.55 mg/dL — ABNORMAL LOW (ref 0.61–1.24)
GFR calc Af Amer: >60 mL/min (ref >60)
GFR calc non Af Amer: >60 mL/min (ref >60)
Glucose, Bld: 84 mg/dL (ref 70–99)
Potassium: 3.8 mmol/L (ref 3.5–5.1)
Sodium: 132 mmol/L — ABNORMAL LOW (ref 135–145)
Total Bilirubin: 0.6 mg/dL (ref 0.3–1.2)
Total Protein: 4.9 g/dL — ABNORMAL LOW (ref 6.5–8.1)

## 2018-11-22 LAB — CK: Total CK: 4826 U/L — ABNORMAL HIGH (ref 49–397)

## 2018-11-22 MED ORDER — FUROSEMIDE 10 MG/ML IJ SOLN
40.0000 mg | Freq: Once | INTRAMUSCULAR | Status: AC
  Administered 2018-11-22: 40 mg via INTRAVENOUS
  Filled 2018-11-22: qty 4

## 2018-11-22 MED ORDER — SODIUM CHLORIDE 0.9 % IV SOLN
INTRAVENOUS | Status: DC
  Administered 2018-11-22: 16:00:00 via INTRAVENOUS
  Administered 2018-11-24: 1 mL via INTRAVENOUS

## 2018-11-22 NOTE — Progress Notes (Signed)
PROGRESS NOTE    [name]  ATF:[id number] DOB: 05-23-1941 DOA: 11/12/2018 PCP: Dettinger, Fransisca Kaufmann, MD   Brief Narrative:  Per HPI:  Joshua Mckee a 78 y.o.malewith medical history significant forSBO,prostate cancer, GI bleed, alcohol abuse,seizure disorder who was brought to the ED via EMS after he fell and he was unable to get up, significant other could not help getting up either. He reports his left leg that has been giving him problems just give out.He denies vomiting or loose stools, reports good p.o. intake. Is abdominal pain or distention. Last bowel movement was today. Reports he has not drank alcohol in over a year.  Recenthospitalization 12/27 to 09/21/2018 forsmall bowel obstruction,requiring exploratory laparotomyforsmall bowel resection and lysis of adhesions. Discharge to home with home health, andhas done well from a surgical standpoint.  Patient has been admitted with a fall secondary to chronic left ankle problems, but has incidentally been noted to have severe hypokalemia and hypomagnesemia likely secondary to HCTZ use. He has also been noted to have rhabdomyolysis from the fall but this is now worsening despite aggressive IV fluid administration.  His statin has been withheld.  He was also noted to have incidental finding of small bowel obstruction on imaging during admission, but this does not appear to be clinically relevant.  He was seen by general surgery early on, and his diet has been now advanced to full which he is tolerating with no GI symptomatology.  PT has evaluated the patient several days prior with recommendations for SNF/rehab on discharge.  He is eagerly awaiting discharge and would like to go to rehab as soon as he can, but his CK levels continue to remain quite elevated despite aggressive IV fluid administration.   Assessment & Plan:   Principal Problem:   Rhabdomyolysis Active Problems:   Hypertension   Seizure (HCC)  Hypokalemia  1. Fall-mechanical, secondary to chronic left ankle problems. PT evaluation appreciated, recommends SNF rehab.   2. Hypokalemia/hypomagnesemia-resolved. Likely secondary to HCTZ use  3. Rhabdomyolysis secondary to fall- compounded by statin use --- CKs remained persistently elevated but IV fluids and even bicarb drip , curbside conversation with nephrologist, given normal renal function, given relatively stable range of CKs, given negative work-up for inflammatory and infectious etiologies so far--- if over the next couple days CKs remain within the same range may be okay to discharge to SNF rehab and repeat CKs as outpatient in about a week post discharge.  CK is down to 4826 from a high of 7546.  Avoid statins, which he was on during admission.  respiratory panel as well as HIV, HSV, EBV, and varicella are all negative, TSH, ANA and ESR pending.  Okay to stop IV bicarb drip, give gentle hydration with normal saline iv  4. Anemia.  Continue follow-up CBC in a.m.  No overt bleeding identified.  Most of this may be delusional from aggressive IV fluid hydration.    5)Transaminitis-improved, AST higher than ALT , ??? If some degree of alcoholic hepatitis,viral hepatitis panelnegative for any acute findings. Ultrasound showed fatty liver, but negative for stones or cholecystitis, but there are multiple hepatic cysts.Ammonia level within normal limits.  CT abdomen and pelvis without acute findings   6)Mild Troponin elevation. Flat trend and likely associated with rhabdomyolysis with no associated chest pain.  Not Consistent with ACS pattern, patient remains chest pain-free    7)Small bowel obstruction-incidental finding. No clinical symptoms noted.  General surgery has signed off.  Tolerating his diet well, having bowel  movements  8)C7 vertebral lesion-incidental finding. History of prostate cancer in the past. Will need to be reevaluated in the outpatient setting by  urology,  9)History of seizures- stable, no seizurescontinue oral Keppra.   DVT prophylaxis:SCDs Code Status:Full Family Communication:None at bedside Disposition Plan: IV fluid hydration and treatmentof rhabdomyolysis, transfer to SNF/rehab when ready for discharge.   Consultants:  General surgery  Procedures:  None  Antimicrobials:   None   Subjective:  Eating okay, had BMs  Objective: Vitals:   11/21/18 1324 11/21/18 2127 11/22/18 0521 11/22/18 0630  BP: 111/80 100/78 109/81   Pulse: (!) 103 (!) 107 (!) 110   Resp: '18 20 16   ' Temp: 97.6 F (36.4 C) 98.3 F (36.8 C) 97.9 F (36.6 C)   TempSrc: Oral Oral Oral   SpO2: 99% 98% 93%   Weight:    88.6 kg  Height:        Intake/Output Summary (Last 24 hours) at 11/22/2018 1456 Last data filed at 11/22/2018 1000 Gross per 24 hour  Intake 2613.52 ml  Output 3250 ml  Net -636.48 ml   Filed Weights   11/18/18 0500 11/18/18 1106 11/22/18 0630  Weight: 80.4 kg 88.3 kg 88.6 kg    Examination:   Physical Exam  Patient is examined daily including today on 11/22/2018  , exams remain the same as of yesterday except that has changed   Gen:- Awake Alert,  In no apparent distress  HEENT:- [place].AT, No sclera icterus Neck-Supple Neck,No JVD,.  Lungs-  CTAB , no wheezing CV- S1, S2 normal Abd-  +ve B.Sounds, Abd Soft, No tenderness,    Extremity/Skin:- No  edema, intact pulses, warm and dry Psych-affect is appropriate, oriented x3 Neuro-generalized weakness, but no new focal deficits, no tremors    CBC: Recent Labs  Lab 11/17/18 0406 11/19/18 0451  WBC 6.5 4.6  HGB 9.9* 9.3*  HCT 31.5* 30.0*  MCV 87.3 85.7  PLT 168 833   Basic Metabolic Panel: Recent Labs  Lab 11/18/18 0411 11/19/18 0451 11/20/18 0425 11/21/18 0835 11/22/18 0830  NA 134* 137 135 133* 132*  K 4.2 3.7 3.2* 3.1* 3.8  CL 107 110 104 96* 91*  CO2 '22 22 26 30 ' 33*  GLUCOSE 73 66* 87 85 84  BUN '11 9 9 10 11  ' CREATININE  0.47* 0.40* 0.45* 0.46* 0.55*  CALCIUM 7.0* 6.7* 6.6* 6.5* 6.5*   GFR: Estimated Creatinine Clearance: 76.6 mL/min (A) (by C-G formula based on SCr of 0.55 mg/dL (L)). Liver Function Tests: Recent Labs  Lab 11/18/18 0411 11/19/18 0451 11/20/18 0425 11/21/18 0835 11/22/18 0830  AST 264* 259* 258* 272* 261*  ALT 200* 187* 197* 218* 217*  ALKPHOS 72 63 74 86 91  BILITOT 0.7 0.6 0.6 0.7 0.6  PROT 4.6* 4.3* 4.3* 4.8* 4.9*  ALBUMIN 1.8* 1.6* 1.6* 1.8* 1.7*   No results for input(s): LIPASE, AMYLASE in the last 168 hours. No results for input(s): AMMONIA in the last 168 hours. Coagulation Profile: No results for input(s): INR, PROTIME in the last 168 hours. Cardiac Enzymes: Recent Labs  Lab 11/18/18 0411 11/19/18 0451 11/20/18 0425 11/21/18 0835 11/22/18 0830  CKTOTAL 4,785* 5,709* 4,873* 5,181* 4,826*   BNP (last 3 results) No results for input(s): PROBNP in the last 8760 hours. HbA1C: No results for input(s): HGBA1C in the last 72 hours. CBG: No results for input(s): GLUCAP in the last 168 hours. Lipid Profile: No results for input(s): CHOL, HDL, LDLCALC, TRIG, CHOLHDL, LDLDIRECT in the  last 72 hours. Thyroid Function Tests: No results for input(s): TSH, T4TOTAL, FREET4, T3FREE, THYROIDAB in the last 72 hours. Anemia Panel: No results for input(s): VITAMINB12, FOLATE, FERRITIN, TIBC, IRON, RETICCTPCT in the last 72 hours. Sepsis Labs: No results for input(s): PROCALCITON, LATICACIDVEN in the last 168 hours.  Recent Results (from the past 240 hour(s))  MRSA PCR Screening     Status: None   Collection Time: 11/12/18  5:21 PM  Result Value Ref Range Status   MRSA by PCR NEGATIVE NEGATIVE Final    Comment:        The GeneXpert MRSA Assay (FDA approved for NASAL specimens only), is one component of a comprehensive MRSA colonization surveillance program. It is not intended to diagnose MRSA infection nor to guide or monitor treatment for MRSA infections. Performed  at [facility], [address], [place], [place] [address]   Respiratory Panel by PCR     Status: None   Collection Time: 11/18/18  8:03 AM  Result Value Ref Range Status   Adenovirus NOT DETECTED NOT DETECTED Final   Coronavirus 229E NOT DETECTED NOT DETECTED Final    Comment: (NOTE) The Coronavirus on the Respiratory Panel, DOES NOT test for the novel  Coronavirus (2019 nCoV)    Coronavirus HKU1 NOT DETECTED NOT DETECTED Final   Coronavirus NL63 NOT DETECTED NOT DETECTED Final   Coronavirus OC43 NOT DETECTED NOT DETECTED Final   Metapneumovirus NOT DETECTED NOT DETECTED Final   Rhinovirus / Enterovirus NOT DETECTED NOT DETECTED Final   Influenza A NOT DETECTED NOT DETECTED Final   Influenza B NOT DETECTED NOT DETECTED Final   Parainfluenza Virus 1 NOT DETECTED NOT DETECTED Final   Parainfluenza Virus 2 NOT DETECTED NOT DETECTED Final   Parainfluenza Virus 3 NOT DETECTED NOT DETECTED Final   Parainfluenza Virus 4 NOT DETECTED NOT DETECTED Final   Respiratory Syncytial Virus NOT DETECTED NOT DETECTED Final   Bordetella pertussis NOT DETECTED NOT DETECTED Final   Chlamydophila pneumoniae NOT DETECTED NOT DETECTED Final   Mycoplasma pneumoniae NOT DETECTED NOT DETECTED Final    Comment: Performed at [facility], [place] [address] [place],  [address]     Radiology Studies: No results found.   Scheduled Meds: . famotidine  20 mg Oral Daily  . levETIRAcetam  500 mg Oral BID  . pantoprazole  40 mg Oral Daily  . senna-docusate  2 tablet Oral BID  . tamsulosin  0.4 mg Oral Daily   Continuous Infusions: . sodium chloride       LOS: 10 days     Roxan Hockey, MD [facility]   If 7PM-7AM, please contact night-coverage [email or web address] Password [facility] 11/22/2018, 2:56 PM

## 2018-11-23 LAB — SEDIMENTATION RATE: Sed Rate: 5 mm/hr (ref 0–16)

## 2018-11-23 LAB — HSV DNA BY PCR (REFERENCE LAB)
HSV 1 DNA: NEGATIVE
HSV 2 DNA: NEGATIVE

## 2018-11-23 LAB — CK: Total CK: 3932 U/L — ABNORMAL HIGH (ref 49–397)

## 2018-11-23 LAB — TSH: TSH: 1.175 u[IU]/mL (ref 0.350–4.500)

## 2018-11-23 NOTE — Progress Notes (Signed)
PROGRESS NOTE    Joshua Mckee  XNA:[id number] DOB: 04/04/1941 DOA: 11/12/2018 PCP: Dettinger, Fransisca Kaufmann, MD   Brief Narrative:  Per HPI:  Joshua Mckee a 78 y.o.malewith medical history significant forSBO,prostate cancer, GI bleed, alcohol abuse,seizure disorder who was brought to the ED via EMS after he fell and he was unable to get up, significant other could not help getting up either. He reports his left leg that has been giving him problems just give out.He denies vomiting or loose stools, reports good p.o. intake. deniess abdominal pain or distention. Had BM on the day of admission. Reports he has not drank alcohol in over a year.   Recenthospitalization 12/27 to 09/21/2018 forsmall bowel obstruction,requiring exploratory laparotomyforsmall bowel resection and lysis of adhesions. Discharge to home with home health, andhas done well from a surgical standpoint.  Patient has been admitted with a fall secondary to chronic left ankle problems, but has incidentally been noted to have severe hypokalemia and hypomagnesemia likely secondary to HCTZ use. He has also been noted to have rhabdomyolysis from the fall but this is now worsening despite aggressive IV fluid administration.  His statin has been withheld.  He was also noted to have incidental finding of small bowel obstruction on imaging during admission, but this does not appear to be clinically relevant.  He was seen by general surgery early on, and his diet has been now advanced to full which he is tolerating with no GI symptomatology.  PT has evaluated the patient several days prior with recommendations for SNF/rehab on discharge.  He is eagerly awaiting discharge and would like to go to rehab as soon as he can, but his CK levels continue to remain quite elevated despite aggressive IV fluid administration.   Assessment & Plan:   Principal Problem:   Rhabdomyolysis Active Problems:   Hypertension   Seizure (HCC)  Hypokalemia  1. Fall-mechanical, secondary to chronic left ankle problems. PT evaluation appreciated, recommends SNF rehab.   2. Hypokalemia/hypomagnesemia-resolved. Likely secondary to HCTZ use  Rhabdomyolysis secondary to fall- compounded by statin use --- CKs is down to 3932, from 7546,  c/n IV fluids and off bicarb drip , curbside conversation with nephrologist, given normal renal function, given relatively stable range of CKs, given negative work-up for inflammatory and infectious etiologies so far--- if over the next couple days CKs remain within the same range may be okay to discharge to SNF rehab and repeat CKs as outpatient in about a week post discharge.  Avoid statins, which he was on during admission.  respiratory panel as well as HIV, HSV, EBV, and varicella are all negative, TSH is 1.1, ANA pending and ESR is only 5.    3. Anemia.  Continue follow-up CBC in a.m.  No overt bleeding identified.  Most of this may be delusional from aggressive IV fluid hydration.    5)Transaminitis-improved, AST higher than ALT , ??? If some degree of alcoholic hepatitis,viral hepatitis panelnegative for any acute findings. Ultrasound showed fatty liver, but negative for stones or cholecystitis, but there are multiple hepatic cysts.Ammonia level within normal limits.  CT abdomen and pelvis without acute findings   6)Mild Troponin elevation. Flat trend and likely associated with rhabdomyolysis with no associated chest pain.  Not Consistent with ACS pattern, patient remains chest pain-free    7)Small bowel obstruction-incidental finding. No clinical symptoms noted.  General surgery has signed off.  Tolerating his diet well, having bowel movements, okay to discontinue Senokot-S  8)C7 vertebral lesion-incidental finding. History of  prostate cancer in the past. Will need to be reevaluated in the outpatient setting by urology,  9)History of seizures- stable, no seizurescontinue oral  Keppra.   DVT prophylaxis:SCDs Code Status:Full Family Communication:None at bedside Disposition Plan: IV fluid hydration and treatmentof rhabdomyolysis, transfer to SNF/rehab when ready for discharge.   Consultants:  General surgery  Procedures:  None  Antimicrobials:   None   Subjective:  Eating okay, complains about loose stools, will discontinue Senokot-S  Objective: Vitals:   11/22/18 2257 11/23/18 0606 11/23/18 0638 11/23/18 1307  BP: 108/72  101/73 108/78  Pulse: 97  87 84  Resp: _0 Temp: 97.9 F (36.6 C)  97.8 F (36.6 C) (!) 97.5 F (36.4 C)  TempSrc: Oral  Oral Oral  SpO2: 100%  100% 100%  Weight:  87.1 kg    Height:        Intake/Output Summary (Last 24 hours) at 11/23/2018 1611 Last data filed at 11/23/2018 1300 Gross per 24 hour  Intake 1295.1 ml  Output 1550 ml  Net -254.9 ml   Filed Weights   11/18/18 1106 11/22/18 0630 11/23/18 0606  Weight: 88.3 kg 88.6 kg 87.1 kg    Examination:   Physical Exam  Patient is examined daily including today on 11/23/2018  , exams remain the same as of yesterday except that has changed   Gen:- Awake Alert,  In no apparent distress  HEENT:- [place].AT, No sclera icterus Neck-Supple Neck,No JVD,.  Lungs-  CTAB , no wheezing CV- S1, S2 normal Abd-  +ve B.Sounds, Abd Soft, No tenderness,    Extremity/Skin:- No  edema, intact pulses, warm and dry Psych-affect is appropriate, oriented x3 Neuro-generalized weakness, but no new focal deficits, no tremors    CBC: Recent Labs  Lab 11/17/18 0406 11/19/18 0451  WBC 6.5 4.6  HGB 9.9* 9.3*  HCT 31.5* 30.0*  MCV 87.3 85.7  PLT 168 327   Basic Metabolic Panel: Recent Labs  Lab 11/18/18 0411 11/19/18 0451 11/20/18 0425 11/21/18 0835 11/22/18 0830  NA 134* 137 135 133* 132*  K 4.2 3.7 3.2* 3.1* 3.8  CL 107 110 104 96* 91*  CO2 _1 33*  GLUCOSE 73 66* 87 85 84  BUN _2 CREATININE 0.47* 0.40* 0.45* 0.46* 0.55*   CALCIUM 7.0* 6.7* 6.6* 6.5* 6.5*   GFR: Estimated Creatinine Clearance: 75.9 mL/min (A) (by C-G formula based on SCr of 0.55 mg/dL (L)). Liver Function Tests: Recent Labs  Lab 11/18/18 0411 11/19/18 0451 11/20/18 0425 11/21/18 0835 11/22/18 0830  AST 264* 259* 258* 272* 261*  ALT 200* 187* 197* 218* 217*  ALKPHOS 72 63 74 86 91  BILITOT 0.7 0.6 0.6 0.7 0.6  PROT 4.6* 4.3* 4.3* 4.8* 4.9*  ALBUMIN 1.8* 1.6* 1.6* 1.8* 1.7*   No results for input(s): LIPASE, AMYLASE in the last 168 hours. No results for input(s): AMMONIA in the last 168 hours. Coagulation Profile: No results for input(s): INR, PROTIME in the last 168 hours. Cardiac Enzymes: Recent Labs  Lab 11/19/18 0451 11/20/18 0425 11/21/18 0835 11/22/18 0830 11/23/18 0617  CKTOTAL 5,709* 4,873* 5,181* 4,826* 3,932*   BNP (last 3 results) No results for input(s): PROBNP in the last 8760 hours. HbA1C: No results for input(s): HGBA1C in the last 72 hours. CBG: No results for input(s): GLUCAP in the last 168 hours. Lipid Profile: No results for input(s): CHOL, HDL, LDLCALC, TRIG, CHOLHDL, LDLDIRECT in the last 72 hours. Thyroid Function  Tests: Recent Labs    11/23/18 0617  TSH 1.175   Anemia Panel: No results for input(s): VITAMINB12, FOLATE, FERRITIN, TIBC, IRON, RETICCTPCT in the last 72 hours. Sepsis Labs: No results for input(s): PROCALCITON, LATICACIDVEN in the last 168 hours.  Recent Results (from the past 240 hour(s))  Respiratory Panel by PCR     Status: None   Collection Time: 11/18/18  8:03 AM  Result Value Ref Range Status   Adenovirus NOT DETECTED NOT DETECTED Final   Coronavirus 229E NOT DETECTED NOT DETECTED Final    Comment: (NOTE) The Coronavirus on the Respiratory Panel, DOES NOT test for the novel  Coronavirus (2019 nCoV)    Coronavirus HKU1 NOT DETECTED NOT DETECTED Final   Coronavirus NL63 NOT DETECTED NOT DETECTED Final   Coronavirus OC43 NOT DETECTED NOT DETECTED Final    Metapneumovirus NOT DETECTED NOT DETECTED Final   Rhinovirus / Enterovirus NOT DETECTED NOT DETECTED Final   Influenza A NOT DETECTED NOT DETECTED Final   Influenza B NOT DETECTED NOT DETECTED Final   Parainfluenza Virus 1 NOT DETECTED NOT DETECTED Final   Parainfluenza Virus 2 NOT DETECTED NOT DETECTED Final   Parainfluenza Virus 3 NOT DETECTED NOT DETECTED Final   Parainfluenza Virus 4 NOT DETECTED NOT DETECTED Final   Respiratory Syncytial Virus NOT DETECTED NOT DETECTED Final   Bordetella pertussis NOT DETECTED NOT DETECTED Final   Chlamydophila pneumoniae NOT DETECTED NOT DETECTED Final   Mycoplasma pneumoniae NOT DETECTED NOT DETECTED Final    Comment: Performed at [facility], [place]. [address], [place], [place] [address]     Radiology Studies: No results found.   Scheduled Meds: . famotidine  20 mg Oral Daily  . levETIRAcetam  500 mg Oral BID  . pantoprazole  40 mg Oral Daily  . tamsulosin  0.4 mg Oral Daily   Continuous Infusions: . sodium chloride 75 mL/hr at 11/22/18 1607     LOS: 11 days   Roxan Hockey, MD [facility]   If 7PM-7AM, please contact night-coverage [email or web address] Password [email or web address] 11/23/2018, 4:11 PM

## 2018-11-24 LAB — ANA W/REFLEX IF POSITIVE: Anti Nuclear Antibody(ANA): NEGATIVE

## 2018-11-24 LAB — CK: CK TOTAL: 3654 U/L — AB (ref 49–397)

## 2018-11-24 LAB — COMPREHENSIVE METABOLIC PANEL
ALT: 192 U/L — ABNORMAL HIGH (ref 0–44)
ANION GAP: 8 (ref 5–15)
AST: 214 U/L — ABNORMAL HIGH (ref 15–41)
Albumin: 1.6 g/dL — ABNORMAL LOW (ref 3.5–5.0)
Alkaline Phosphatase: 84 U/L (ref 38–126)
BUN: 11 mg/dL (ref 8–23)
CALCIUM: 6.6 mg/dL — AB (ref 8.9–10.3)
CO2: 29 mmol/L (ref 22–32)
Chloride: 98 mmol/L (ref 98–111)
Creatinine, Ser: 0.44 mg/dL — ABNORMAL LOW (ref 0.61–1.24)
GFR calc Af Amer: >60 mL/min (ref >60)
Glucose, Bld: 72 mg/dL (ref 70–99)
Potassium: 4 mmol/L (ref 3.5–5.1)
Sodium: 135 mmol/L (ref 135–145)
Total Bilirubin: 1 mg/dL (ref 0.3–1.2)
Total Protein: 4.3 g/dL — ABNORMAL LOW (ref 6.5–8.1)

## 2018-11-24 MED ORDER — ASPIRIN 325 MG PO TABS: 325.0000 mg | ORAL_TABLET | Freq: Every day | ORAL | 0 refills | Status: AC

## 2018-11-24 MED ORDER — ONDANSETRON 4 MG PO TBDP: 4.0000 mg | ORAL_TABLET | Freq: Four times a day (QID) | ORAL | 0 refills | Status: AC | PRN

## 2018-11-24 NOTE — Clinical Social Work Placement (Signed)
   CLINICAL SOCIAL WORK PLACEMENT  NOTE  Date:  11/24/2018  Patient Details  Name: Joshua Mckee MRN: [id number] Date of Birth: 1941/03/04  Clinical Social Work is seeking post-discharge placement for this patient at the [place] level of care (*CSW will initial, date and re-position this form in  chart as items are completed):  Yes   Patient/family provided with [facility] Work Department's list of facilities offering this level of care within the geographic area requested by the patient (or if unable, by the patient's family).  Yes   Patient/family informed of their freedom to choose among providers that offer the needed level of care, that participate in Medicare, Medicaid or managed care program needed by the patient, have an available bed and are willing to accept the patient.  Yes   Patient/family informed of [facility]'s ownership interest in [facility] and [facility], as well as of the fact that they are under no obligation to receive care at these facilities.  PASRR submitted to EDS on 11/14/18     PASRR number received on 11/14/18     Existing PASRR number confirmed on       FL2 transmitted to all facilities in geographic area requested by pt/family on 11/14/18     FL2 transmitted to all facilities within larger geographic area on       Patient informed that his/her managed care company has contracts with or will negotiate with certain facilities, including the following:        Yes   Patient/family informed of bed offers received.  Patient chooses bed at [facility]     Physician recommends and patient chooses bed at      Patient to be transferred to [facility] on 11/24/18.  Patient to be transferred to facility by [facility]     Patient family notified on 11/24/18 of transfer.  Name of family member notified:  [name], significant other.     PHYSICIAN       Additional Comment:  Discharge clinicals  sent to facility. LCSW signing off.   _______________________________________________ Ihor Gully, LCSW 11/24/2018, 2:57 PM

## 2018-11-24 NOTE — Discharge Instructions (Signed)
1)Avoid ibuprofen/Advil/Aleve/Motrin/Goody Powders/Naproxen/BC powders/Meloxicam/Diclofenac/Indomethacin and other Nonsteroidal anti-inflammatory medications as these will make you more likely to bleed and can cause stomach ulcers, can also cause Kidney problems.   2)Please Repeat Total CK (creatinine kinase) on Friday 11/28/2018  3)Please Repeat CMP Test (complete metabolic profile and liver test) on Friday 11/28/18  4) stop Lipitor/avoid statins  5) maintain adequate hydration

## 2018-11-24 NOTE — Progress Notes (Signed)
Report called and given to Ronnie Derby, [facility], All questions were answered and no further questions at this time. Pt in stable condition and in no acute distress at time of discharge. Pt will be transported by [facility].

## 2018-11-24 NOTE — Discharge Summary (Signed)
Joshua Mckee, is a 78 y.o. male  DOB 1941-05-29  MRN [id number].  Admission date:  11/12/2018  Admitting Physician  Roxan Hockey, MD  Discharge Date:  11/24/2018   Primary MD  Dettinger, Fransisca Kaufmann, MD  Recommendations for primary care physician for things to follow:   1)Avoid ibuprofen/Advil/Aleve/Motrin/Goody Powders/Naproxen/BC powders/Meloxicam/Diclofenac/Indomethacin and other Nonsteroidal anti-inflammatory medications as these will make you more likely to bleed and can cause stomach ulcers, can also cause Kidney problems.   2)Please Repeat Total CK (creatinine kinase) on Friday 11/28/2018  3)Please Repeat CMP Test (complete metabolic profile and liver test) on Friday 11/28/18  4) stop Lipitor/avoid statins  5) maintain adequate hydration   Admission Diagnosis  Hypokalemia [E87.6] Hypomagnesemia [E83.42] SBO (small bowel obstruction) ([place]) [K56.609] Transaminitis [R74.0] Fall in home, initial encounter [W19.XXXA, Y92.009] Traumatic rhabdomyolysis, initial encounter ([place]) [T79.6XXA]   Discharge Diagnosis  Hypokalemia [E87.6] Hypomagnesemia [E83.42] SBO (small bowel obstruction) ([place]) [K56.609] Transaminitis [R74.0] Fall in home, initial encounter [W19.XXXA, Y92.009] Traumatic rhabdomyolysis, initial encounter ([place]) [T79.6XXA]    Principal Problem:   Rhabdomyolysis Active Problems:   Hypertension   Seizure ([place])   Hypokalemia      Past Medical History:  Diagnosis Date  . AKI (acute kidney injury) ([place]) 09/15/2018  . Aneurysm of right common iliac artery (HCC)    2.2 cm per CT 02-18-2017  . Arthritis   . GERD (gastroesophageal reflux disease)   . Hiatal hernia   . Hiccups    CHRONIC--- MULTIPLE ED VISITS  . History of alcohol abuse   . History of GI bleed    upper GI bleed from esophageal ulcer 01/ 2017 and 03/ 2017 from erosive esophageal gastiris  . 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 History of small bowel obstruction    04-06-2005  s/p  exp. lap. w/ lysis adhesions for partial sbo  . Hyperlipidemia   . Hyperlipidemia   . Hyperplasia of prostate with lower urinary tract symptoms (LUTS)   . Hypertension   . LAFB (left anterior fascicular block)   . Mild cognitive impairment with memory loss    PROBABLE STROKE RESIDUAL BUT POSSIBLE PRIOR TO STROKE  . Partial seizure ([place])    x1  10-15-2016 at ED visit , post cva 08-27-2016  . Poor historian   . Prostate cancer [facility]) urologist-  dr Jeffie Pollock  . Schatzki's ring   . Stroke ([place])   . Substance abuse [facility])     Past Surgical History:  Procedure Laterality Date  . BOWEL RESECTION N/A 09/16/2018   Procedure: PARTIAL SMALL BOWEL RESECTION;  Surgeon: Aviva Signs, MD;  Location: [facility] ORS;  Service: General;  Laterality: N/A;  . COLONOSCOPY N/A 03/08/2016   Procedure: COLONOSCOPY;  Surgeon: Daneil Dolin, MD;  Location: [facility] ENDO SU[facility];  Service: Endoscopy;  Laterality: N/A;  1300-moved to 1400  . ESOPHAGOGASTRODUODENOSCOPY N/A 10/05/2015  Dr. Gala Romney: erosive reflux esophagitis, non-critcal Schatki's ring non-manipulated  . ESOPHAGOGASTRODUODENOSCOPY N/A 12/15/2015   Dr. Rourk:non bleeding esophageal ulcer/small HH  . ESOPHAGOGASTRODUODENOSCOPY N/A 03/08/2016   Procedure: ESOPHAGOGASTRODUODENOSCOPY (EGD);  Surgeon: Daneil Dolin, MD;  Location: [facility];  Service: Endoscopy;  Laterality: N/A;  . EXPLORATORY LAPAROTOMY W/ ADHESIOLYSIS  04/06/2005   partial SBO  . LAPAROTOMY N/A 09/16/2018   Procedure: EXPLORATORY LAPAROTOMY;  Surgeon: Aviva Signs, MD;  Location: [facility];  Service: General;  Laterality: N/A;  . LYSIS OF ADHESION N/A 09/16/2018   Procedure: LYSIS OF ADHESION;  Surgeon: Aviva Signs, MD;  Location: [facility];   Service: General;  Laterality: N/A;  . ORCHIECTOMY Bilateral 03/05/2017   Procedure: ORCHIECTOMY BILATERAL;  Surgeon: Irine Seal, MD;  Location[facility];  Service: Urology;  Laterality: Bilateral;  . PROSTATE BIOPSY N/A 03/05/2017   Procedure: BIOPSY TRANSRECTAL ULTRASONIC PROSTATE (TUBP);  Surgeon: Irine Seal, MD;  Location: [facility];  Service: Urology;  Laterality: N/A;  . TRANSTHORACIC ECHOCARDIOGRAM  08/28/2016   mild to moderate LVH, ef 55-60%/  mild AR and TR/  trivial MR       HPI  from the history and physical done on the day of admission:     Chief Complaint: Fall  HPI: Joshua Mckee is a 78 y.o. male with medical history significant for SBO, prostate cancer, GI bleed, alcohol abuse, seizure disorder who was brought to the ED via EMS after he fell and he was unable to get up, significant other could not help getting up either.  He reports his left leg that has been giving him problems just give out.  He denies vomiting or loose stools, reports good p.o. intake.  Is abdominal pain or distention.  Last bowel movement was today.  Reports he has not drank alcohol in over a year.  Recent hospitalization 12/27 to 09/21/2018 for small bowel obstruction, requiring exploratory laparotomy for small bowel resection and lysis of adhesions.  Discharge to home with home health, and has done well from a surgical standpoint.  ED Course: Hypotensive systolic down to 20/35, marked hypokalemia-2, hypomagnesemia- 1.4, lactic acidosis 3.2 >>1.3 after 2 L bolus, negative serum alcohol level.  Troponin 0.03.  Elevated CK 7546.  Imaging included CT abdomen and pelvis without contrast- revealed small bowel obstruction, abrupt caliber change at the anastomosis at the left lower quadrant.  Right upper quadrant ultrasound-mild appearing fatty infiltration of the liver, negative for stones or cholecystitis.  Cervical CT-12 mm sclerotic lesion within the right transverse process  /articular facets C7 vertebral body.  May represent bone island versus sclerotic metastatic lesion.  Head CT negative for acute abnormality, two-view chest x-ray lumbar x-ray pelvic x-ray negative for acute abnormality     Hospital Course:   Brief Narrative:  Per HPI:  Joshua Priceis a 78 y.o.malewith medical history significant forSBO,prostate cancer, GI bleed, alcohol abuse,seizure disorder who was brought to the ED via EMS after he fell and he was unable to get up, significant other could not help getting up either. He reports his left leg that has been giving him problems just give out.He denies vomiting or loose stools, reports good p.o. intake. deniess abdominal pain or distention. Had BM on the day of admission. Reports he has not drank alcohol in over a year.   Recenthospitalization 12/27 to 09/21/2018 forsmall bowel obstruction,requiring exploratory laparotomyforsmall bowel resection and lysis of adhesions. Discharge to home with home health, andhas done well from a surgical standpoint.  Patient has been admitted with a fall secondary to chronic left ankle problems, but has incidentally been noted to have severe hypokalemia and hypomagnesemia likely secondary to HCTZ use. He has also been noted to have rhabdomyolysis from the fall but this is now worsening despite aggressive IV fluid administration.  His statin has been withheld.  He was also noted to have incidental finding of small bowel obstruction on imaging during admission, but this does not appear to be clinically relevant.  He was seen by general surgery early on, and his diet has been now advanced to full which he is tolerating with no GI symptomatology.  PT has evaluated the patient several days prior with recommendations for SNF/rehab on discharge.    Assessment & Plan:   Principal Problem:   Rhabdomyolysis Active Problems:   Hypertension   Seizure (HCC)   Hypokalemia  1. Fall-mechanical, secondary  to chronic left ankle problems. PT evaluation appreciated, recommends SNF rehab.   2. Hypokalemia/hypomagnesemia-resolved. Likely secondary to HCTZ use  3)Rhabdomyolysis secondary to fall- compounded by statin/Lipitor use --- CKs is down to 3932, from 7546,  c/n IV fluids and off bicarb drip , curbside conversation with nephrologist, given normal renal function, given relatively stable range of CKs, given negative work-up for inflammatory and infectious etiologies so far---  okay to discharge to SNF rehab and repeat CKs as outpatient in about a week post discharge.  Avoid statins, which he was on during admission.  respiratory panel as well as HIV, HSV, EBV, and varicella are all negative, TSH is 1.1, ANA pending and ESR is only 5.    Total CK is down to 3654 from a peak of 7667  4)Anemia.  Continue follow-up CBC in a.m.  No overt bleeding identified.  Most of this may be delusional from aggressive IV fluid hydration.    5)Transaminitis-improved, AST higher than ALT , ??? If some degree of alcoholic hepatitis,viral hepatitis panelnegative for any acute findings. EBV Ultrasound showed fatty liver, but negative for stones or cholecystitis, but there are multiple hepatic cysts.Ammonia level within normal limits.  CT abdomen and pelvis without acute findings , AST is down to 214 from a peak of 426, ALT is down to 192 from a peak of 257, alk phos and Bilirubin are WNL  6)Mild Troponin elevation. Flat trend and likely associated with rhabdomyolysis with no associated chest pain.  Not Consistent with ACS pattern, patient remains chest pain-free   7)Small bowel obstruction-incidental finding. No clinical symptoms noted.  General surgery has signed off.  Tolerating his diet well, having bowel movements, change Senokot-S to qhs  8)C7 vertebral lesion-incidental finding. History of prostate cancer in the past. Will need to be reevaluated in the outpatient setting by urology,  9)History  of seizures- stable, no seizurescontinue oral Keppra.   DVT prophylaxis:SCDs Code Status:Full Family Communication: S/o at bedside    Consultants:  General surgery  Discharge Condition: stable  Follow UP  Contact information for after-discharge care    Destination    [facility] .   Service:  Skilled Nursing Contact information: [address] [place] [phone]              Diet and Activity recommendation:  As advised  Discharge Instructions    Discharge Instructions    Call MD for:  difficulty breathing, headache or visual disturbances   Complete by:  As directed    Call MD for:  persistant dizziness  or light-headedness   Complete by:  As directed    Call MD for:  persistant nausea and vomiting   Complete by:  As directed    Call MD for:  severe uncontrolled pain   Complete by:  As directed    Call MD for:  temperature >100.4   Complete by:  As directed    Diet - low sodium heart healthy   Complete by:  As directed    Discharge instructions   Complete by:  As directed    1)Avoid ibuprofen/Advil/Aleve/Motrin/Goody Powders/Naproxen/BC powders/Meloxicam/Diclofenac/Indomethacin and other Nonsteroidal anti-inflammatory medications as these will make you more likely to bleed and can cause stomach ulcers, can also cause Kidney problems.   2)Please Repeat Total CK (creatinine kinase) on Friday 11/28/2018  3)Please Repeat CMP Test (complete metabolic profile and liver test) on Friday 11/28/18  4) stop Lipitor/avoid statins  5) maintain adequate hydration   Increase activity slowly   Complete by:  As directed        Discharge Medications     Allergies as of 11/24/2018   No Known Allergies     Medication List    STOP taking these medications   atorvastatin 80 MG tablet Commonly known as:  LIPITOR   bisacodyl 10 MG suppository Commonly known as:   DULCOLAX   hydrochlorothiazide 25 MG tablet Commonly known as:  HYDRODIURIL   magnesium hydroxide 400 MG/5ML suspension Commonly known as:  MILK OF MAGNESIA     TAKE these medications   aspirin 325 MG tablet Take 1 tablet (325 mg total) by mouth daily with breakfast. What changed:  when to take this   levETIRAcetam 500 MG tablet Commonly known as:  KEPPRA TAKE ONE TABLET BY MOUTH 2 TIMES A DAY.   ondansetron 4 MG disintegrating tablet Commonly known as:  ZOFRAN-ODT Take 1 tablet (4 mg total) by mouth every 6 (six) hours as needed for nausea.   pantoprazole 40 MG tablet Commonly known as:  PROTONIX TAKE (1) TABLET BY MOUTH ONCE DAILY.   tamsulosin 0.4 MG Caps capsule Commonly known as:  FLOMAX Take 1 capsule (0.4 mg total) by mouth daily.       Major procedures and Radiology Reports - PLEASE review detailed and final reports for all details, in brief -   Ct Abdomen Pelvis Wo Contrast  Result Date: 11/12/2018 CLINICAL DATA:  Fall, evaluate possible small bowel obstruction EXAM: CT ABDOMEN AND PELVIS WITHOUT CONTRAST TECHNIQUE: Multidetector CT imaging of the abdomen and pelvis was performed following the standard protocol without IV contrast. COMPARISON:  CT abdomen pelvis, 09/15/2018 FINDINGS: Lower chest: Bibasilar scarring and irregular opacity, improved compared to prior examination. Hepatobiliary: Numerous small fluid attenuation liver lesions. No gallstones, gallbladder wall thickening, or biliary dilatation. Pancreas: Unremarkable. No pancreatic ductal dilatation or surrounding inflammatory changes. Spleen: Normal in size without focal abnormality. Adrenals/Urinary Tract: Adrenal glands are unremarkable. Kidneys are normal, without renal calculi, focal lesion, or hydronephrosis. Bladder is unremarkable. Stomach/Bowel: Stomach is within normal limits. There is diffuse dilatation of the proximal to mid small bowel, with interval findings of a distal small bowel resection.  There appears to be an abrupt caliber change at the anastomosis, in the left lower quadrant (series 2, image 64). Largest loops of bowel measure 3.8 cm. There is gas and stool present in the colon to the rectum. Vascular/Lymphatic: Calcific atherosclerosis. No enlarged abdominal or pelvic lymph nodes. Reproductive: No mass or other abnormality. Other: No abdominal wall hernia or abnormality. No abdominopelvic ascites. Musculoskeletal: No  acute or significant osseous findings. IMPRESSION: 1. There is diffuse dilatation of the proximal to mid small bowel, with interval findings of a distal small bowel resection. There appears to be an abrupt caliber change at the anastomosis, in the left lower quadrant (series 2, image 64). Largest loops of bowel measure 3.8 cm. There is gas and stool present in the colon to the rectum. 2.  Other chronic and incidental findings as detailed above. Electronically Signed   By: Eddie Candle M.D.   On: 11/12/2018 15:00   Dg Chest 2 View  Result Date: 11/12/2018 CLINICAL DATA:  Fall at home today.  Weakness. EXAM: CHEST - 2 VIEW COMPARISON:  09/13/2018 FINDINGS: The heart size and mediastinal contours are within normal limits. Both lungs are clear. The visualized skeletal structures are unremarkable. IMPRESSION: No active cardiopulmonary disease. Electronically Signed   By: Earle Gell M.D.   On: 11/12/2018 10:19   Dg Lumbar Spine Complete  Result Date: 11/12/2018 CLINICAL DATA:  Status post fall with leg pain. EXAM: LUMBAR SPINE - COMPLETE 4+ VIEW COMPARISON:  Body CT 09/15/2018 FINDINGS: There is no evidence of lumbar spine fracture. Alignment is normal. Multilevel osteoarthritic changes. Dilated small bowel loops measure up to 6.2 cm. IMPRESSION: Dilated small bowel loops measure up to 6.2 cm, consistent with small bowel obstruction. No evidence of lumbosacral spine fracture. Electronically Signed   By: Fidela Salisbury M.D.   On: 11/12/2018 10:24   Ct Head Wo  Contrast  Result Date: 11/12/2018 CLINICAL DATA:  Patient fell from standing position with left leg weakness. EXAM: CT HEAD WITHOUT CONTRAST CT CERVICAL SPINE WITHOUT CONTRAST TECHNIQUE: Multidetector CT imaging of the head and cervical spine was performed following the standard protocol without intravenous contrast. Multiplanar CT image reconstructions of the cervical spine were also generated. COMPARISON:  Brain MRI 08/28/2018 FINDINGS: CT HEAD FINDINGS Brain: No evidence of acute infarction, hemorrhage, hydrocephalus, extra-axial collection or mass lesion/mass effect. Lacunar infarct in the right pre central gyrus demonstrated by MRI is not well seen by CT. Remote right thalamic ischemic infarct. Stable brain parenchymal volume loss with ventriculomegaly and mild deep white matter microangiopathy. Vascular: Calcific atherosclerotic disease. Skull: Normal. Negative for fracture or focal lesion. Sinuses/Orbits: No acute finding. Other: None. CT CERVICAL SPINE FINDINGS Alignment: Reversal of cervical lordosis. Skull base and vertebrae: No acute fracture. 12 mm sclerotic lesion within the right transverse process/articular facet of C7 vertebral body Soft tissues and spinal canal: No prevertebral fluid or swelling. No visible canal hematoma. Small amount of gas within the spinal canal/left neural foramen at L3-L4, likely degenerative. Disc levels:  Multilevel osteoarthritic changes. Upper chest: Negative. Other: None. IMPRESSION: 1. No acute intracranial abnormality. 2. Atrophy, chronic microvascular disease. 3. Lacunar infarct in the right pre central gyrus demonstrated by MRI is not well seen by CT. 4. No evidence of acute traumatic injury to the cervical spine. 5. 12 mm sclerotic lesion within the right transverse process/articular facet of C7 vertebral body. This may represent a bone island versus sclerotic metastatic lesion. Electronically Signed   By: Fidela Salisbury M.D.   On: 11/12/2018 10:38   Ct  Cervical Spine Wo Contrast  Result Date: 11/12/2018 CLINICAL DATA:  Patient fell from standing position with left leg weakness. EXAM: CT HEAD WITHOUT CONTRAST CT CERVICAL SPINE WITHOUT CONTRAST TECHNIQUE: Multidetector CT imaging of the head and cervical spine was performed following the standard protocol without intravenous contrast. Multiplanar CT image reconstructions of the cervical spine were also generated. COMPARISON:  Brain MRI 08/28/2018 FINDINGS: CT HEAD FINDINGS Brain: No evidence of acute infarction, hemorrhage, hydrocephalus, extra-axial collection or mass lesion/mass effect. Lacunar infarct in the right pre central gyrus demonstrated by MRI is not well seen by CT. Remote right thalamic ischemic infarct. Stable brain parenchymal volume loss with ventriculomegaly and mild deep white matter microangiopathy. Vascular: Calcific atherosclerotic disease. Skull: Normal. Negative for fracture or focal lesion. Sinuses/Orbits: No acute finding. Other: None. CT CERVICAL SPINE FINDINGS Alignment: Reversal of cervical lordosis. Skull base and vertebrae: No acute fracture. 12 mm sclerotic lesion within the right transverse process/articular facet of C7 vertebral body Soft tissues and spinal canal: No prevertebral fluid or swelling. No visible canal hematoma. Small amount of gas within the spinal canal/left neural foramen at L3-L4, likely degenerative. Disc levels:  Multilevel osteoarthritic changes. Upper chest: Negative. Other: None. IMPRESSION: 1. No acute intracranial abnormality. 2. Atrophy, chronic microvascular disease. 3. Lacunar infarct in the right pre central gyrus demonstrated by MRI is not well seen by CT. 4. No evidence of acute traumatic injury to the cervical spine. 5. 12 mm sclerotic lesion within the right transverse process/articular facet of C7 vertebral body. This may represent a bone island versus sclerotic metastatic lesion. Electronically Signed   By: Fidela Salisbury M.D.   On: 11/12/2018  10:38   US Abdomen Limited  Result Date: 11/12/2018 CLINICAL DATA:  Elevated liver function tests. EXAM: ULTRASOUND ABDOMEN LIMITED RIGHT UPPER QUADRANT COMPARISON:  CT abdomen and pelvis 09/15/2018. FINDINGS: Gallbladder: The gallbladder is incompletely distended. There is some sludge within the gallbladder. No pericholecystic fluid. Sonographer reports negative Murphy's sign. No wall thickening. Common bile duct: Diameter: 0.4 cm Liver: Multiple up hepatic cysts are identified as seen on prior CT scan. Echogenicity is mildly increased. Portal vein is patent on color Doppler imaging with normal direction of blood flow towards the liver. IMPRESSION: Mild appearing fatty infiltration of the liver. Small volume of gallbladder sludge. Negative for stones or cholecystitis. Multiple hepatic cysts. Electronically Signed   By: Inge Rise M.D.   On: 11/12/2018 11:34   Dg Hips Bilat W Or Wo Pelvis 5 Views  Result Date: 11/12/2018 CLINICAL DATA:  Status post fall with leg pain. EXAM: DG HIP (WITH OR WITHOUT PELVIS) 5+V BILAT COMPARISON:  None. FINDINGS: There is no evidence of hip fracture or dislocation. There is no evidence of arthropathy or other focal bone abnormality. IMPRESSION: Negative. Electronically Signed   By: Fidela Salisbury M.D.   On: 11/12/2018 10:20    Micro Results    Recent Results (from the past 240 hour(s))  Respiratory Panel by PCR     Status: None   Collection Time: 11/18/18  8:03 AM  Result Value Ref Range Status   Adenovirus NOT DETECTED NOT DETECTED Final   Coronavirus 229E NOT DETECTED NOT DETECTED Final    Comment: (NOTE) The Coronavirus on the Respiratory Panel, DOES NOT test for the novel  Coronavirus (2019 nCoV)    Coronavirus HKU1 NOT DETECTED NOT DETECTED Final   Coronavirus NL63 NOT DETECTED NOT DETECTED Final   Coronavirus OC43 NOT DETECTED NOT DETECTED Final   Metapneumovirus NOT DETECTED NOT DETECTED Final   Rhinovirus / Enterovirus NOT DETECTED NOT  DETECTED Final   Influenza A NOT DETECTED NOT DETECTED Final   Influenza B NOT DETECTED NOT DETECTED Final   Parainfluenza Virus 1 NOT DETECTED NOT DETECTED Final   Parainfluenza Virus 2 NOT DETECTED NOT DETECTED Final   Parainfluenza Virus 3 NOT DETECTED NOT DETECTED Final  Parainfluenza Virus 4 NOT DETECTED NOT DETECTED Final   Respiratory Syncytial Virus NOT DETECTED NOT DETECTED Final   Bordetella pertussis NOT DETECTED NOT DETECTED Final   Chlamydophila pneumoniae NOT DETECTED NOT DETECTED Final   Mycoplasma pneumoniae NOT DETECTED NOT DETECTED Final    Comment: Performed at [facility], [place] [address], [place], [place] [address]    Today   Subjective    Esias Mory today has no new concerns, s/o at bedside, questions answered          Patient has been seen and examined prior to discharge   Objective   Blood pressure 122/84, pulse 84, temperature 97.6 F (36.4 C), temperature source Oral, resp. rate 16, height 5' (1.524 m), weight 85.2 kg, SpO2 97 %.   Intake/Output Summary (Last 24 hours) at 11/24/2018 1401 Last data filed at 11/24/2018 0900 Gross per 24 hour  Intake 480 ml  Output 1550 ml  Net -1070 ml    Exam Gen:- Awake Alert,  In no apparent distress  HEENT:- [place].AT, No sclera icterus Neck-Supple Neck,No JVD,.  Lungs-  CTAB , no wheezing CV- S1, S2 normal Abd-  +ve B.Sounds, Abd Soft, No tenderness,    Extremity/Skin:- No  edema, intact pulses, warm and dry Psych-affect is appropriate, oriented x3 Neuro-generalized weakness, but no new focal deficits, no tremors   Data Review   CBC w Diff:  Lab Results  Component Value Date   WBC 4.6 11/19/2018   HGB 9.3 (L) 11/19/2018   HGB 12.5 (L) 09/03/2018   HCT 30.0 (L) 11/19/2018   HCT 36.0 (L) 09/03/2018   PLT 185 11/19/2018   PLT 214 09/03/2018   LYMPHOPCT 10 11/12/2018   MONOPCT 7 11/12/2018   EOSPCT 0 11/12/2018   BASOPCT 0 11/12/2018    CMP:  Lab Results  Component Value Date   NA 135  11/24/2018   NA 139 09/03/2018   K 4.0 11/24/2018   CL 98 11/24/2018   CO2 29 11/24/2018   BUN 11 11/24/2018   BUN 18 09/03/2018   CREATININE 0.44 (L) 11/24/2018   CREATININE 0.98 10/02/2017   PROT 4.3 (L) 11/24/2018   PROT 6.9 09/03/2018   ALBUMIN 1.6 (L) 11/24/2018   ALBUMIN 3.9 09/03/2018   BILITOT 1.0 11/24/2018   BILITOT 0.3 09/03/2018   ALKPHOS 84 11/24/2018   AST 214 (H) 11/24/2018   ALT 192 (H) 11/24/2018  .   Total Discharge time is about 33 minutes  Roxan Hockey M.D on 11/24/2018 at 2:01 PM  Go to [email or web address] -  for contact info  [facility] - Office  [phone]

## 2018-12-01 ENCOUNTER — Other Ambulatory Visit: Payer: Self-pay | Admitting: Family Medicine

## 2018-12-04 ENCOUNTER — Other Ambulatory Visit: Payer: Self-pay | Admitting: *Deleted

## 2018-12-04 NOTE — Patient Outreach (Signed)
[facility]) Care Management  12/04/2018  Jaymian Bogart 1941-06-17 [id number]   Collaboration with Christinia Gully, UM nurse after IDT telephonic meeting with [facility].  Patient seems very confused and weak.  His discharge plan is to return home upon discharge.  Will monitor for discharge and progress and make [facility] CM referral as indicated. Royetta Crochet. Laymond Purser, MSN, RN, [employer] , [place] [phone]) Business Cell  [phone]) Toll Free Office

## 2018-12-16 ENCOUNTER — Other Ambulatory Visit: Payer: Self-pay | Admitting: *Deleted

## 2018-12-16 ENCOUNTER — Ambulatory Visit: Payer: Medicare Other | Admitting: Gastroenterology

## 2018-12-16 NOTE — Patient Outreach (Signed)
[facility]) Care Management  12/16/2018  Joshua Mckee [birth year] [id number]   Noted that patient is no longer part of [facility],so will not be eligible for [facility] care management program upon discharge.  Plan to collaborate with [facility] UM and will sign off. Royetta Crochet. Laymond Purser, MSN, RN, [employer] , [place] [phone]) Business Cell  [phone]) Toll Free Office

## 2018-12-23 ENCOUNTER — Emergency Department (HOSPITAL_COMMUNITY): Payer: Medicare Other

## 2018-12-23 ENCOUNTER — Other Ambulatory Visit: Payer: Self-pay

## 2018-12-23 ENCOUNTER — Encounter (HOSPITAL_COMMUNITY): Payer: Self-pay

## 2018-12-23 ENCOUNTER — Inpatient Hospital Stay (HOSPITAL_COMMUNITY)
Admission: EM | Admit: 2018-12-23 | Discharge: 2019-01-16 | DRG: 871 | Disposition: E | Payer: Medicare Other | Attending: Internal Medicine | Admitting: Internal Medicine

## 2018-12-23 DIAGNOSIS — E162 Hypoglycemia, unspecified: Secondary | ICD-10-CM | POA: Diagnosis present

## 2018-12-23 DIAGNOSIS — D509 Iron deficiency anemia, unspecified: Secondary | ICD-10-CM | POA: Diagnosis present

## 2018-12-23 DIAGNOSIS — Z8546 Personal history of malignant neoplasm of prostate: Secondary | ICD-10-CM | POA: Diagnosis not present

## 2018-12-23 DIAGNOSIS — E8809 Other disorders of plasma-protein metabolism, not elsewhere classified: Secondary | ICD-10-CM | POA: Diagnosis present

## 2018-12-23 DIAGNOSIS — R531 Weakness: Secondary | ICD-10-CM

## 2018-12-23 DIAGNOSIS — R930 Abnormal findings on diagnostic imaging of skull and head, not elsewhere classified: Secondary | ICD-10-CM

## 2018-12-23 DIAGNOSIS — E8770 Fluid overload, unspecified: Secondary | ICD-10-CM | POA: Diagnosis present

## 2018-12-23 DIAGNOSIS — J918 Pleural effusion in other conditions classified elsewhere: Secondary | ICD-10-CM | POA: Diagnosis present

## 2018-12-23 DIAGNOSIS — Z7982 Long term (current) use of aspirin: Secondary | ICD-10-CM

## 2018-12-23 DIAGNOSIS — J189 Pneumonia, unspecified organism: Secondary | ICD-10-CM

## 2018-12-23 DIAGNOSIS — Z7189 Other specified counseling: Secondary | ICD-10-CM

## 2018-12-23 DIAGNOSIS — M6282 Rhabdomyolysis: Secondary | ICD-10-CM | POA: Diagnosis present

## 2018-12-23 DIAGNOSIS — R601 Generalized edema: Secondary | ICD-10-CM | POA: Diagnosis present

## 2018-12-23 DIAGNOSIS — K2100 Gastro-esophageal reflux disease with esophagitis, without bleeding: Secondary | ICD-10-CM | POA: Diagnosis present

## 2018-12-23 DIAGNOSIS — Z66 Do not resuscitate: Secondary | ICD-10-CM | POA: Diagnosis present

## 2018-12-23 DIAGNOSIS — R0902 Hypoxemia: Secondary | ICD-10-CM | POA: Diagnosis not present

## 2018-12-23 DIAGNOSIS — E876 Hypokalemia: Secondary | ICD-10-CM | POA: Diagnosis present

## 2018-12-23 DIAGNOSIS — R1310 Dysphagia, unspecified: Secondary | ICD-10-CM | POA: Diagnosis present

## 2018-12-23 DIAGNOSIS — Z8673 Personal history of transient ischemic attack (TIA), and cerebral infarction without residual deficits: Secondary | ICD-10-CM

## 2018-12-23 DIAGNOSIS — Z79899 Other long term (current) drug therapy: Secondary | ICD-10-CM | POA: Diagnosis not present

## 2018-12-23 DIAGNOSIS — E785 Hyperlipidemia, unspecified: Secondary | ICD-10-CM | POA: Diagnosis present

## 2018-12-23 DIAGNOSIS — A419 Sepsis, unspecified organism: Secondary | ICD-10-CM | POA: Diagnosis present

## 2018-12-23 DIAGNOSIS — I959 Hypotension, unspecified: Secondary | ICD-10-CM | POA: Diagnosis present

## 2018-12-23 DIAGNOSIS — N400 Enlarged prostate without lower urinary tract symptoms: Secondary | ICD-10-CM | POA: Diagnosis present

## 2018-12-23 DIAGNOSIS — Z515 Encounter for palliative care: Secondary | ICD-10-CM | POA: Diagnosis present

## 2018-12-23 DIAGNOSIS — K117 Disturbances of salivary secretion: Secondary | ICD-10-CM

## 2018-12-23 DIAGNOSIS — Y95 Nosocomial condition: Secondary | ICD-10-CM | POA: Diagnosis present

## 2018-12-23 DIAGNOSIS — G40909 Epilepsy, unspecified, not intractable, without status epilepticus: Secondary | ICD-10-CM

## 2018-12-23 DIAGNOSIS — I10 Essential (primary) hypertension: Secondary | ICD-10-CM | POA: Diagnosis present

## 2018-12-23 DIAGNOSIS — T796XXA Traumatic ischemia of muscle, initial encounter: Secondary | ICD-10-CM | POA: Diagnosis not present

## 2018-12-23 DIAGNOSIS — J181 Lobar pneumonia, unspecified organism: Secondary | ICD-10-CM | POA: Diagnosis present

## 2018-12-23 DIAGNOSIS — J90 Pleural effusion, not elsewhere classified: Secondary | ICD-10-CM | POA: Diagnosis not present

## 2018-12-23 DIAGNOSIS — T796XXD Traumatic ischemia of muscle, subsequent encounter: Secondary | ICD-10-CM | POA: Diagnosis not present

## 2018-12-23 DIAGNOSIS — G912 (Idiopathic) normal pressure hydrocephalus: Secondary | ICD-10-CM | POA: Diagnosis present

## 2018-12-23 DIAGNOSIS — R0781 Pleurodynia: Secondary | ICD-10-CM

## 2018-12-23 LAB — URINALYSIS, ROUTINE W REFLEX MICROSCOPIC
Bilirubin Urine: NEGATIVE
Glucose, UA: NEGATIVE mg/dL
Ketones, ur: NEGATIVE mg/dL
Leukocytes,Ua: NEGATIVE
Nitrite: NEGATIVE
Protein, ur: NEGATIVE mg/dL
Specific Gravity, Urine: 1.02 (ref 1.005–1.030)
pH: 5 (ref 5.0–8.0)

## 2018-12-23 LAB — CBC
HCT: 31 % — ABNORMAL LOW (ref 39.0–52.0)
Hemoglobin: 9.9 g/dL — ABNORMAL LOW (ref 13.0–17.0)
MCH: 28.2 pg (ref 26.0–34.0)
MCHC: 31.9 g/dL (ref 30.0–36.0)
MCV: 88.3 fL (ref 80.0–100.0)
Platelets: 192 10*3/uL (ref 150–400)
RBC: 3.51 MIL/uL — ABNORMAL LOW (ref 4.22–5.81)
RDW: 17.3 % — ABNORMAL HIGH (ref 11.5–15.5)
WBC: 24.8 10*3/uL — ABNORMAL HIGH (ref 4.0–10.5)
nRBC: 0 % (ref 0.0–0.2)

## 2018-12-23 LAB — PROCALCITONIN: Procalcitonin: 4.11 ng/mL

## 2018-12-23 LAB — COMPREHENSIVE METABOLIC PANEL
ALT: 149 U/L — ABNORMAL HIGH (ref 0–44)
AST: 115 U/L — ABNORMAL HIGH (ref 15–41)
Albumin: 1.6 g/dL — ABNORMAL LOW (ref 3.5–5.0)
Alkaline Phosphatase: 122 U/L (ref 38–126)
Anion gap: 8 (ref 5–15)
BUN: 17 mg/dL (ref 8–23)
CO2: 33 mmol/L — ABNORMAL HIGH (ref 22–32)
Calcium: 7 mg/dL — ABNORMAL LOW (ref 8.9–10.3)
Chloride: 94 mmol/L — ABNORMAL LOW (ref 98–111)
Creatinine, Ser: 0.54 mg/dL — ABNORMAL LOW (ref 0.61–1.24)
GFR calc Af Amer: >60 mL/min (ref >60)
GFR calc non Af Amer: >60 mL/min (ref >60)
Glucose, Bld: 62 mg/dL — ABNORMAL LOW (ref 70–99)
Potassium: 3.2 mmol/L — ABNORMAL LOW (ref 3.5–5.1)
Sodium: 135 mmol/L (ref 135–145)
Total Bilirubin: 1.1 mg/dL (ref 0.3–1.2)
Total Protein: 4.7 g/dL — ABNORMAL LOW (ref 6.5–8.1)

## 2018-12-23 LAB — LIPASE, BLOOD: Lipase: 15 U/L (ref 11–51)

## 2018-12-23 LAB — PROTIME-INR
INR: 1.4 — ABNORMAL HIGH (ref 0.8–1.2)
Prothrombin Time: 17 seconds — ABNORMAL HIGH (ref 11.4–15.2)

## 2018-12-23 LAB — BRAIN NATRIURETIC PEPTIDE: B Natriuretic Peptide: 320 pg/mL — ABNORMAL HIGH (ref 0.0–100.0)

## 2018-12-23 LAB — CK: Total CK: 1003 U/L — ABNORMAL HIGH (ref 49–397)

## 2018-12-23 LAB — APTT: aPTT: 37 seconds — ABNORMAL HIGH (ref 24–36)

## 2018-12-23 LAB — LACTIC ACID, PLASMA: Lactic Acid, Venous: 1.4 mmol/L (ref 0.5–1.9)

## 2018-12-23 LAB — TROPONIN I: Troponin I: <0.03 ng/mL (ref <0.03)

## 2018-12-23 MED ORDER — SODIUM CHLORIDE 0.9 % IV BOLUS
1000.0000 mL | Freq: Once | INTRAVENOUS | Status: AC
  Administered 2018-12-23: 1000 mL via INTRAVENOUS

## 2018-12-23 MED ORDER — HEPARIN SODIUM (PORCINE) 5000 UNIT/ML IJ SOLN
5000.0000 [IU] | Freq: Three times a day (TID) | INTRAMUSCULAR | Status: AC
  Administered 2018-12-23 – 2018-12-28 (×16): 5000 [IU] via SUBCUTANEOUS
  Filled 2018-12-23 (×17): qty 1

## 2018-12-23 MED ORDER — ONDANSETRON HCL 4 MG/2ML IJ SOLN: 4.0000 mg | Freq: Four times a day (QID) | INTRAMUSCULAR | Status: DC | PRN

## 2018-12-23 MED ORDER — VANCOMYCIN HCL 10 G IV SOLR
1500.0000 mg | Freq: Once | INTRAVENOUS | Status: AC
  Administered 2018-12-23: 1500 mg via INTRAVENOUS
  Filled 2018-12-23: qty 1500

## 2018-12-23 MED ORDER — VANCOMYCIN HCL IN DEXTROSE 1-5 GM/200ML-% IV SOLN: 1000.0000 mg | Freq: Once | INTRAVENOUS | Status: DC

## 2018-12-23 MED ORDER — ONDANSETRON HCL 4 MG PO TABS: 4.0000 mg | ORAL_TABLET | Freq: Four times a day (QID) | ORAL | Status: DC | PRN

## 2018-12-23 MED ORDER — PANTOPRAZOLE SODIUM 40 MG PO TBEC
40.0000 mg | DELAYED_RELEASE_TABLET | Freq: Every day | ORAL | Status: DC
  Administered 2018-12-23 – 2018-12-28 (×6): 40 mg via ORAL
  Filled 2018-12-23 (×6): qty 1

## 2018-12-23 MED ORDER — VANCOMYCIN HCL IN DEXTROSE 1-5 GM/200ML-% IV SOLN
1000.0000 mg | Freq: Two times a day (BID) | INTRAVENOUS | Status: DC
  Administered 2018-12-24: 1000 mg via INTRAVENOUS
  Filled 2018-12-23: qty 200

## 2018-12-23 MED ORDER — LEVETIRACETAM 500 MG PO TABS
500.0000 mg | ORAL_TABLET | Freq: Two times a day (BID) | ORAL | Status: DC
  Administered 2018-12-23 – 2018-12-28 (×11): 500 mg via ORAL
  Filled 2018-12-23 (×13): qty 1

## 2018-12-23 MED ORDER — MIDODRINE HCL 5 MG PO TABS
5.0000 mg | ORAL_TABLET | Freq: Three times a day (TID) | ORAL | Status: DC
  Administered 2018-12-23 – 2018-12-28 (×16): 5 mg via ORAL
  Filled 2018-12-23 (×16): qty 1

## 2018-12-23 MED ORDER — SODIUM CHLORIDE 0.9 % IV SOLN
2.0000 g | Freq: Once | INTRAVENOUS | Status: AC
  Administered 2018-12-23: 09:00:00 2 g via INTRAVENOUS

## 2018-12-23 MED ORDER — ALBUMIN HUMAN 25 % IV SOLN
25.0000 g | Freq: Two times a day (BID) | INTRAVENOUS | Status: AC
  Administered 2018-12-23 – 2018-12-24 (×4): 25 g via INTRAVENOUS
  Filled 2018-12-23: qty 100
  Filled 2018-12-23 (×2): qty 50
  Filled 2018-12-23 (×2): qty 100

## 2018-12-23 MED ORDER — GUAIFENESIN ER 600 MG PO TB12
1200.0000 mg | ORAL_TABLET | Freq: Two times a day (BID) | ORAL | Status: DC
  Administered 2018-12-23 – 2018-12-27 (×10): 1200 mg via ORAL
  Filled 2018-12-23 (×11): qty 2

## 2018-12-23 MED ORDER — ACETAMINOPHEN 650 MG RE SUPP: 650.0000 mg | Freq: Four times a day (QID) | RECTAL | Status: DC | PRN

## 2018-12-23 MED ORDER — SODIUM CHLORIDE 0.9 % IV SOLN
2.0000 g | Freq: Two times a day (BID) | INTRAVENOUS | Status: DC
  Administered 2018-12-24 – 2018-12-30 (×13): 2 g via INTRAVENOUS
  Filled 2018-12-23 (×15): qty 2

## 2018-12-23 MED ORDER — ASPIRIN 325 MG PO TABS
325.0000 mg | ORAL_TABLET | Freq: Every day | ORAL | Status: DC
  Administered 2018-12-24 – 2018-12-28 (×5): 325 mg via ORAL
  Filled 2018-12-23 (×6): qty 1

## 2018-12-23 MED ORDER — SODIUM CHLORIDE 0.9 % IV SOLN
2.0000 g | Freq: Once | INTRAVENOUS | Status: AC
  Administered 2018-12-23: 2 g via INTRAVENOUS
  Filled 2018-12-23: qty 2

## 2018-12-23 MED ORDER — SODIUM CHLORIDE 0.9 % IV BOLUS
500.0000 mL | Freq: Once | INTRAVENOUS | Status: AC
  Administered 2018-12-23: 500 mL via INTRAVENOUS

## 2018-12-23 MED ORDER — SODIUM CHLORIDE 0.9 % IV SOLN
INTRAVENOUS | Status: AC
  Administered 2018-12-23: 15:00:00 via INTRAVENOUS

## 2018-12-23 MED ORDER — SODIUM CHLORIDE 0.9 % IV SOLN
500.0000 mg | INTRAVENOUS | Status: DC
  Administered 2018-12-23: 500 mg via INTRAVENOUS
  Filled 2018-12-23: qty 500

## 2018-12-23 MED ORDER — ACETAMINOPHEN 325 MG PO TABS
650.0000 mg | ORAL_TABLET | Freq: Four times a day (QID) | ORAL | Status: DC | PRN
  Administered 2018-12-23: 650 mg via ORAL
  Filled 2018-12-23: qty 2

## 2018-12-23 MED ORDER — TAMSULOSIN HCL 0.4 MG PO CAPS
0.4000 mg | ORAL_CAPSULE | Freq: Every day | ORAL | Status: DC
  Administered 2018-12-23 – 2018-12-28 (×6): 0.4 mg via ORAL
  Filled 2018-12-23 (×6): qty 1

## 2018-12-23 NOTE — H&P (Signed)
History and Physical    Joshua Mckee JME:[id number] DOB: 1941/08/18 DOA: 01/13/2019  PCP: Dettinger, Fransisca Kaufmann, MD  Patient coming from: Home  I have personally briefly reviewed patient's old medical records in [place]  Chief Complaint: Left upper quadrant/left chest pain.  HPI: Joshua Mckee is a 78 y.o. male with medical history significant of previous CVA, seizure disorder, recent admission for fall and rhabdomyolysis, patient on 3/9 to skilled nursing facility.  His wife reports that he is returning home over the last 10 days.  The patient has been in a wheelchair since his discharge from the hospital.  She reports that since returning home, he is been complaining of left rib pain.  He is also had generalized weakness for the past several days.  His p.o. intake has been poor for quite some time now.  He has not had any fevers.  He has not had any cough.  No nausea or vomiting.  He is moving his bowels and passing gas.  ED Course: On arrival to the emergency room, blood pressure was noted to be marginally low.  This was responsive to IV fluids.  WBC count noted to be elevated at 24.8.  Total CK level elevated at 1003, which has trended down since prior admission.  Transaminases also mildly elevated at 115 and 149, trending down from prior levels.  Albumin low at 1.6.  Chest x-ray indicates left lower lobe infiltrate with effusion.  CT of the abdomen pelvis did not show any acute findings in the abdomen.  CT head was also unremarkable.  Urinalysis did not show any signs of infection.  Review of Systems: As per HPI otherwise 10 point review of systems negative.    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 lap. w/ lysis adhesions for partial sbo   Hyperlipidemia    Hyperlipidemia    Hyperplasia of prostate with lower urinary tract symptoms (LUTS)    Hypertension    LAFB (left anterior fascicular block)    Mild cognitive impairment with memory loss    PROBABLE STROKE RESIDUAL BUT POSSIBLE PRIOR TO STROKE   Partial seizure ([place])    x1  10-15-2016 at ED visit , post cva 08-27-2016   Poor historian    Prostate cancer [facility]) urologist-  dr Jeffie Pollock   Schatzki's ring    Stroke [facility])    Substance abuse [facility])     Past Surgical History:  Procedure Laterality Date   BOWEL RESECTION N/A 09/16/2018   Procedure: PARTIAL SMALL BOWEL RESECTION;  Surgeon: Aviva Signs, MD;  Location: [facility];  Service: General;  Laterality: N/A;   COLONOSCOPY N/A 03/08/2016   Procedure: COLONOSCOPY;  Surgeon: Daneil Dolin, MD;  Location: [facility];  Service: Endoscopy;  Laterality: N/A;  1300-moved to 1400   ESOPHAGOGASTRODUODENOSCOPY N/A 10/05/2015   Dr. Gala Romney: erosive reflux esophagitis, non-critcal Schatki's ring non-manipulated   ESOPHAGOGASTRODUODENOSCOPY N/A 12/15/2015   Dr. Rourk:non bleeding esophageal ulcer/small HH   ESOPHAGOGASTRODUODENOSCOPY N/A 03/08/2016   Procedure: ESOPHAGOGASTRODUODENOSCOPY (EGD);  Surgeon: Daneil Dolin, MD;  Location: [facility];  Service: Endoscopy;  Laterality: N/A;   EXPLORATORY LAPAROTOMY W/ ADHESIOLYSIS  04/06/2005   partial SBO   LAPAROTOMY N/A 09/16/2018   Procedure: EXPLORATORY LAPAROTOMY;  Surgeon: Aviva Signs, MD;  Location: [facility];  Service: General;  Laterality:  N/A;   LYSIS OF ADHESION N/A 09/16/2018   Procedure: LYSIS OF ADHESION;  Surgeon: Aviva Signs, MD;  Location: [facility];  Service: General;  Laterality: N/A;   ORCHIECTOMY Bilateral 03/05/2017   Procedure: ORCHIECTOMY BILATERAL;  Surgeon: Irine Seal, MD;  Location: [facility];  Service: Urology;  Laterality: Bilateral;   PROSTATE BIOPSY N/A 03/05/2017   Procedure: BIOPSY TRANSRECTAL ULTRASONIC PROSTATE (TUBP);  Surgeon: Irine Seal, MD;  Location: [facility];  Service: Urology;  Laterality: N/A;   TRANSTHORACIC ECHOCARDIOGRAM  08/28/2016   mild to moderate LVH, ef 55-60%/  mild AR and TR/  trivial MR    Social History:  reports that he has never smoked. He has never used smokeless tobacco. He reports that he does not drink alcohol or use drugs.  No Known Allergies  Family History  Problem Relation Age of Onset   Colon cancer Neg Hx      Prior to Admission medications   Medication Sig Start Date End Date Taking? Authorizing Provider  aspirin 325 MG tablet Take 1 tablet (325 mg total) by mouth daily with breakfast. 11/24/18  Yes Emokpae, Courage, MD  atorvastatin (LIPITOR) 80 MG tablet Take 80 mg by mouth daily at 6 PM.  12/22/18  Yes [provider]  hydrochlorothiazide (HYDRODIURIL) 25 MG tablet Take 25 mg by mouth daily.  12/22/18  Yes [provider]  levETIRAcetam (KEPPRA) 500 MG tablet TAKE ONE TABLET BY MOUTH 2 TIMES A DAY. 06/02/18  Yes Dettinger, Fransisca Kaufmann, MD  midodrine (PROAMATINE) 2.5 MG tablet Take 2.5 mg by mouth 2 (two) times daily with a meal.  12/19/18  Yes [provider]  ondansetron (ZOFRAN-ODT) 4 MG disintegrating tablet Take 1 tablet (4 mg total) by mouth every 6 (six) hours as needed for nausea. 11/24/18  Yes Emokpae, Courage, MD  pantoprazole (PROTONIX) 40 MG tablet TAKE (1) TABLET BY MOUTH ONCE DAILY. 06/02/18  Yes Dettinger, Fransisca Kaufmann, MD  tamsulosin (FLOMAX) 0.4 MG CAPS capsule Take 1 capsule (0.4 mg total) by  mouth daily. 06/02/18  Yes Dettinger, Fransisca Kaufmann, MD    Physical Exam: Vitals:   12/17/2018 1445 01/06/2019 1615 01/15/2019 1623 12/27/2018 1700  BP: 96/70 (!) 86/66  93/79  Pulse: 96 99 96   Resp: 20 18 15 20   Temp:   (!) 97.4 F (36.3 C)   TempSrc:   Oral   SpO2: 100% 99% 99% 99%  Weight:      Height:        Constitutional: NAD, calm, comfortable Eyes: PERRL, lids and conjunctivae normal ENMT: Mucous membranes are moist. Posterior pharynx clear of any exudate or lesions.Normal dentition.  Neck: normal, supple, no masses, no thyromegaly Respiratory: Crackles at left base. Normal respiratory effort. No accessory muscle use.  Cardiovascular: Regular rate and rhythm, no murmurs / rubs / gallops.  1-2+ extremity edema. 2+ pedal pulses. No carotid bruits.  Abdomen: no tenderness, no masses palpated. No hepatosplenomegaly. Bowel sounds positive.  Musculoskeletal: no clubbing / cyanosis. No joint deformity upper and lower extremities. Good ROM, no contractures. Normal muscle tone.  Skin: no rashes, lesions, ulcers. No induration Neurologic: CN 2-12 grossly intact. Sensation intact, DTR normal. Strength 5/5 in all 4.  Psychiatric: Normal judgment and insight. Alert and oriented x 3. Normal mood.    Labs on Admission: I have personally reviewed following labs and imaging studies  CBC: Recent Labs  Lab 01/15/2019 0833  WBC 24.8*  HGB 9.9*  HCT 31.0*  MCV 88.3  PLT 782   Basic Metabolic Panel: Recent Labs  Lab 12/24/2018 0833  NA 135  K 3.2*  CL 94*  CO2 33*  GLUCOSE 62*  BUN 17  CREATININE 0.54*  CALCIUM 7.0*   GFR: Estimated Creatinine Clearance: 74.9 mL/min (A) (by C-G formula based on SCr of 0.54 mg/dL (L)). Liver Function Tests: Recent Labs  Lab 12/17/2018 0833  AST 115*  ALT 149*  ALKPHOS 122  BILITOT 1.1  PROT 4.7*  ALBUMIN 1.6*   Recent Labs  Lab 12/24/2018 0833  LIPASE 15   No results for input(s): AMMONIA in the last 168 hours. Coagulation Profile: Recent Labs    Lab 12/21/2018 1514  INR 1.4*   Cardiac Enzymes: Recent Labs  Lab 01/13/2019 0833  CKTOTAL 1,003*  TROPONINI <0.03   BNP (last 3 results) No results for input(s): PROBNP in the last 8760 hours. HbA1C: No results for input(s): HGBA1C in the last 72 hours. CBG: No results for input(s): GLUCAP in the last 168 hours. Lipid Profile: No results for input(s): CHOL, HDL, LDLCALC, TRIG, CHOLHDL, LDLDIRECT in the last 72 hours. Thyroid Function Tests: No results for input(s): TSH, T4TOTAL, FREET4, T3FREE, THYROIDAB in the last 72 hours. Anemia Panel: No results for input(s): VITAMINB12, FOLATE, FERRITIN, TIBC, IRON, RETICCTPCT in the last 72 hours. Urine analysis:    Component Value Date/Time   COLORURINE YELLOW 12/20/2018 0755   APPEARANCEUR CLOUDY (A) 12/30/2018 0755   APPEARANCEUR Clear 01/29/2017 1318   LABSPEC 1.020 01/09/2019 0755   PHURINE 5.0 12/26/2018 0755   GLUCOSEU NEGATIVE 01/11/2019 0755   HGBUR SMALL (A) 01/14/2019 0755   BILIRUBINUR NEGATIVE 12/18/2018 0755   BILIRUBINUR Negative 01/29/2017 1318   KETONESUR NEGATIVE 01/11/2019 0755   PROTEINUR NEGATIVE 12/25/2018 0755   UROBILINOGEN 0.2 08/23/2012 1615   NITRITE NEGATIVE 12/25/2018 0755   LEUKOCYTESUR NEGATIVE 12/30/2018 0755    Radiological Exams on Admission: Ct Abdomen Pelvis Wo Contrast  Result Date: 01/08/2019 CLINICAL DATA:  Left upper quadrant abdominal pain. Multiple recent falls. Generalized weakness. EXAM: CT ABDOMEN AND PELVIS WITHOUT CONTRAST TECHNIQUE: Multidetector CT imaging of the abdomen and pelvis was performed following the standard protocol without IV contrast. COMPARISON:  11/12/2018 FINDINGS: Lower chest: New small to moderate volume left pleural effusion with associated left basilar airspace density suspicious for pneumonia. There also may be mild infiltrate at the right lung base with a trace amount of right pleural fluid. Hepatobiliary: Stable cysts scattered throughout the liver parenchyma.  The gallbladder appears unremarkable. No biliary dilatation. Pancreas: Unremarkable. No pancreatic ductal dilatation or surrounding inflammatory changes. Spleen: Normal in size without focal abnormality. No evidence of perisplenic hemorrhage. Adrenals/Urinary Tract: Adrenal glands are unremarkable. Kidneys are normal, without renal calculi, focal lesion, or hydronephrosis. Stable cyst of the lower pole of the left kidney. Bladder is unremarkable. Stomach/Bowel: There is less prominent dilatation of the small bowel compared to the prior study. There are  some remaining dilated small bowel loops as well as at least one loop of thickened small bowel in the left mid to lower abdomen. No evidence of free air. 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 No enlarged abdominal or pelvic lymph nodes. Reproductive: Prostate is unremarkable. Other: No abdominal wall hernia or abnormality. No focal abscess or ascites. Musculoskeletal: No fractures identified. Mild degenerative disc disease is seen throughout the spine. IMPRESSION: 1. New small to moderate left pleural effusion with associated left lower lobe airspace disease suspicious for pneumonia. There may also be a mild infiltrate at the right lung base. 2. Most prominent small bowel dilatation compared to the prior study. There remains some dilated small bowel loops with at least a single thickened loop of small bowel in the left mid to lower abdomen. No evidence of free air or focal abscess. 3. 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 Electronically Signed   By: Aletta Edouard M.D.   On: 12/26/2018 11:53   Dg Chest 2 View  Result Date: 01/07/2019 CLINICAL DATA:  Fall with difficulty walking.  Left rib pain. EXAM: CHEST - 2 VIEW COMPARISON:  11/12/2018 FINDINGS: Lungs are hypoinflated with opacification over the left base suggesting layering moderate effusion with  associated basilar atelectasis. Infection in the left base is possible. Right lung is clear. Mild stable cardiomegaly. No acute left rib fracture. Remainder of the exam is unchanged. IMPRESSION: Opacification over the left mid to lower lung likely moderate size layering effusion with associated basilar atelectasis. Infection in the left mid to lower lung is possible. No acute fracture. Electronically Signed   By: Marin Olp M.D.   On: 12/28/2018 08:57   Ct Head Wo Contrast  Result Date: 01/04/2019 CLINICAL DATA:  Unable to walk, falls, weakness EXAM: CT HEAD WITHOUT CONTRAST TECHNIQUE: Contiguous axial images were obtained from the base of the skull through the vertex without intravenous contrast. COMPARISON:  11/12/2018 FINDINGS: Brain: No evidence of acute infarction, hemorrhage, hydrocephalus, extra-axial collection or mass lesion/mass effect. Redemonstrated enlarged lateral ventricles and periventricular white matter hypodensity, unchanged in caliber and configuration compared to prior examination Vascular: No hyperdense vessel or unexpected calcification. Skull: Normal. Negative for fracture or focal lesion. Sinuses/Orbits: No acute finding. Other: None. IMPRESSION: No acute intracranial pathology. Redemonstrated enlarged lateral ventricles and periventricular white matter hypodensity, unchanged in caliber and configuration compared to prior examination. Findings likely reflect small-vessel white matter disease and global volume loss with ventricular enlargement ex vacuo, however normal pressure hydrocephalus remains a differential consideration with this appearance, particularly given stated clinical presentation of inability to walk and falls. Electronically Signed   By: Eddie Candle M.D.   On: 01/05/2019 09:18    EKG: Independently reviewed.  Sinus rhythm without acute changes  Assessment/Plan Active Problems:   Reflux esophagitis   History of CVA (cerebrovascular accident)   Weakness    Rhabdomyolysis   PNA (pneumonia)   Seizure disorder (HCC)   Hypoalbuminemia   Anasarca   Pleural effusion on left   Sepsis ([place])     1. Sepsis.  Patient found to have elevated WBC count, low blood pressure and evidence of pneumonia on imaging.  He received a total of 2 L of fluids in the emergency room.  He been started on maintenance hydration.  He is on broad-spectrum antibiotics.  Blood pressure remains low, but he is chronically on Midodrine.  We will continue this. 2. Hypoalbuminemia with anasarca.  Will get nutrition consult.  Patient is third spacing most of his fluids. 3. Seizure disorder.  Continue on Keppra 4. Healthcare associated pneumonia.  Patient had recent stay at skilled nursing facility.  Started on vancomycin and cefepime. 5. Rhabdomyolysis.  Overall transaminases are lower than prior admission.  Continue on IV fluids. 6. Pleural effusion, left.  Likely related to pneumonia.  He does not appear to be particularly short of breath and does not require oxygen.  We will continue to monitor. 7. Generalized weakness.  We will get physical therapy evaluation.  DVT prophylaxis: Lovenox Code Status: DNR, confirmed with patient Family Communication: At the request of patient, his care was discussed with patient's girlfriend.  Patient has requested that his girlfriend be named healthcare power of attorney.  Will assess chaplain to assist with paperwork Disposition Plan: Possible skilled nursing facility placement Consults called:  Admission status: Inpatient, stepdown  Kathie Dike MD [facility]   If 7PM-7AM, please contact night-coverage [email or web address]   12/21/2018, 6:04 PM

## 2018-12-23 NOTE — ED Notes (Signed)
Have applied a condom cath to patient

## 2018-12-23 NOTE — ED Notes (Signed)
Pt to CT

## 2018-12-23 NOTE — ED Provider Notes (Signed)
[facility] Emergency Department Provider Note MRN:  [id number]  Arrival date & time: 12/20/2018     Chief Complaint   Weakness   History of Present Illness   Joshua Mckee is a 78 y.o. year-old male with a history of AKI, stroke presenting to the ED with chief complaint of weakness.  Patient endorsing 2 weeks of left upper abdominal pain/left rib pain.  Had a recent admission back in February for small bowel obstruction and rhabdomyolysis.  For 2 days has been feeling generally weak, issues with walking.  Denies fever, no cough, no headache or vision change, no chest pain or shortness of breath, no lower abdominal pain, no dysuria.  Symptoms are constant, no exacerbating relieving factors.  Review of Systems  A complete 10 system review of systems was obtained and all systems are negative except as noted in the HPI and PMH.   Patient's Health History    Past Medical History:  Diagnosis Date  . AKI (acute kidney injury) ([place]) 09/15/2018  . Aneurysm of right common iliac artery (HCC)    2.2 cm per CT 02-18-2017  . Arthritis   . GERD (gastroesophageal reflux disease)   . Hiatal hernia   . Hiccups    CHRONIC--- MULTIPLE ED VISITS  . History of alcohol abuse   . History of GI bleed    upper GI bleed from esophageal ulcer 01/ 2017 and 03/ 2017 from erosive esophageal gastiris  . 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 History of small bowel obstruction    04-06-2005  s/p  exp. lap. w/ lysis adhesions for partial sbo  . Hyperlipidemia   . Hyperlipidemia   . Hyperplasia of prostate with lower urinary tract symptoms (LUTS)   . Hypertension   . LAFB (left anterior fascicular block)   . Mild cognitive impairment with memory loss    PROBABLE STROKE RESIDUAL BUT POSSIBLE PRIOR TO STROKE  .  Partial seizure ([place])    x1  10-15-2016 at ED visit , post cva 08-27-2016  . Poor historian   . Prostate cancer [facility]) urologist-  dr Jeffie Pollock  . Schatzki's ring   . Stroke ([place])   . Substance abuse [facility])     Past Surgical History:  Procedure Laterality Date  . BOWEL RESECTION N/A 09/16/2018   Procedure: PARTIAL SMALL BOWEL RESECTION;  Surgeon: Aviva Signs, MD;  Location: [facility] ORS;  Service: General;  Laterality: N/A;  . COLONOSCOPY N/A 03/08/2016   Procedure: COLONOSCOPY;  Surgeon: Daneil Dolin, MD;  Location: [facility] ENDO SUITE;  Service: Endoscopy;  Laterality: N/A;  1300-moved to 1400  . ESOPHAGOGASTRODUODENOSCOPY N/A 10/05/2015   Dr. Gala Romney: erosive reflux esophagitis, non-critcal Schatki's ring non-manipulated  . ESOPHAGOGASTRODUODENOSCOPY N/A 12/15/2015   Dr. Rourk:non bleeding esophageal ulcer/small HH  . ESOPHAGOGASTRODUODENOSCOPY N/A 03/08/2016   Procedure: ESOPHAGOGASTRODUODENOSCOPY (EGD);  Surgeon: Daneil Dolin, MD;  Location: [facility] ENDO SUITE;  Service: Endoscopy;  Laterality: N/A;  . EXPLORATORY LAPAROTOMY W/ ADHESIOLYSIS  04/06/2005   partial SBO  . LAPAROTOMY N/A 09/16/2018   Procedure: EXPLORATORY LAPAROTOMY;  Surgeon: Aviva Signs, MD;  Location: [facility] ORS;  Service: General;  Laterality: N/A;  . LYSIS OF ADHESION N/A 09/16/2018   Procedure: LYSIS OF ADHESION;  Surgeon: Aviva Signs, MD;  Location: [facility] ORS;  Service: General;  Laterality: N/A;  . ORCHIECTOMY Bilateral  03/05/2017   Procedure: ORCHIECTOMY BILATERAL;  Surgeon: Irine Seal, MD;  Location: [facility];  Service: Urology;  Laterality: Bilateral;  . PROSTATE BIOPSY N/A 03/05/2017   Procedure: BIOPSY TRANSRECTAL ULTRASONIC PROSTATE (TUBP);  Surgeon: Irine Seal, MD;  Location: [facility];  Service: Urology;  Laterality: N/A;  . TRANSTHORACIC ECHOCARDIOGRAM  08/28/2016   mild to moderate LVH, ef 55-60%/  mild AR and TR/  trivial MR    Family History  Problem Relation Age of Onset  . Colon  cancer Neg Hx     Social History   Socioeconomic History  . Marital status: Widowed    Spouse name: Not on file  . Number of children: 5  . Years of education: 9th grade  . Highest education level: Not on file  Occupational History  . Occupation: retired  Scientific laboratory technician  . Financial resource strain: Not hard at all  . Food insecurity:    Worry: Sometimes true    Inability: Sometimes true  . Transportation needs:    Medical: No    Non-medical: No  Tobacco Use  . Smoking status: Never Smoker  . Smokeless tobacco: Never Used  Substance and Sexual Activity  . Alcohol use: No    Comment: hx alcohol abuse -- last drink 12/ 2017   . Drug use: No  . Sexual activity: Not on file  Lifestyle  . Physical activity:    Days per week: 0 days    Minutes per session: 0 min  . Stress: Not at all  Relationships  . Social connections:    Talks on phone: Once a week    Gets together: Twice a week    Attends religious service: 1 to 4 times per year    Active member of club or organization: No    Attends meetings of clubs or organizations: Never    Relationship status: Widowed  . Intimate partner violence:    Fear of current or ex partner: No    Emotionally abused: No    Physically abused: No    Forced sexual activity: No  Other Topics Concern  . Not on file  Social History Narrative   Lives with lady friend [name]   Irene's daughter helps care for them     Physical Exam  Vital Signs and Nursing Notes reviewed Vitals:   01/08/2019 0900 12/18/2018 0957  BP: (!) 88/70 98/73  Pulse: (!) 104 91  Resp: (!) 21 19  Temp:    SpO2: 99% 98%    CONSTITUTIONAL: Chronically ill-appearing, NAD NEURO:  Alert and oriented x 3, symmetric moderate to severe weakness to the lower extremities, unable to lift off the bed; asymmetric arm weakness with mild left pronator drift EYES:  eyes equal and reactive ENT/NECK:  no LAD, no JVD CARDIO: Regular rate, well-perfused, normal S1 and S2 PULM:  CTAB no  wheezing or rhonchi GI/GU:  normal bowel sounds, non-distended, non-tender MSK/SPINE:  No gross deformities, no edema SKIN:  no rash, atraumatic PSYCH:  Appropriate speech and behavior  Diagnostic and Interventional Summary    EKG Interpretation  Date/Time:  Tuesday December 23 2018 08:21:46 EDT Ventricular Rate:  93 PR Interval:    QRS Duration: 102 QT Interval:  371 QTC Calculation: 462 R Axis:   -33 Text Interpretation:  SR Left axis deviation Low voltage, extremity and precordial leads artifact Confirmed by Gerlene Fee [phone]) on 12/30/2018 8:24:04 AM      Labs Reviewed  CBC - Abnormal; Notable for  the following components:      Result Value   WBC 24.8 (*)    RBC 3.51 (*)    Hemoglobin 9.9 (*)    HCT 31.0 (*)    RDW 17.3 (*)    All other components within normal limits  COMPREHENSIVE METABOLIC PANEL - Abnormal; Notable for the following components:   Potassium 3.2 (*)    Chloride 94 (*)    CO2 33 (*)    Glucose, Bld 62 (*)    Creatinine, Ser 0.54 (*)    Calcium 7.0 (*)    Total Protein 4.7 (*)    Albumin 1.6 (*)    AST 115 (*)    ALT 149 (*)    All other components within normal limits  URINALYSIS, ROUTINE W REFLEX MICROSCOPIC - Abnormal; Notable for the following components:   APPearance CLOUDY (*)    Hgb urine dipstick SMALL (*)    Bacteria, UA RARE (*)    All other components within normal limits  BRAIN NATRIURETIC PEPTIDE - Abnormal; Notable for the following components:   B Natriuretic Peptide 320.0 (*)    All other components within normal limits  CK - Abnormal; Notable for the following components:   Total CK 1,003 (*)    All other components within normal limits  CULTURE, BLOOD (SINGLE)  CULTURE, BLOOD (SINGLE)  LIPASE, BLOOD  TROPONIN I  LACTIC ACID, PLASMA    CT HEAD WO CONTRAST  Final Result    DG Chest 2 View  Final Result    CT ABDOMEN PELVIS WO CONTRAST    (Results Pending)    Medications  azithromycin (ZITHROMAX) 500 mg in sodium  chloride 0.9 % 250 mL IVPB (500 mg Intravenous New Bag/Given 01/08/2019 1003)  sodium chloride 0.9 % bolus 500 mL (0 mLs Intravenous Stopped 12/24/2018 0852)  cefTRIAXone (ROCEPHIN) 2 g in sodium chloride 0.9 % 100 mL IVPB (0 g Intravenous Stopped 12/18/2018 1002)  sodium chloride 0.9 % bolus 1,000 mL (1,000 mLs Intravenous New Bag/Given 12/24/2018 0909)  sodium chloride 0.9 % bolus 500 mL (500 mLs Intravenous New Bag/Given 01/01/2019 1003)     Procedures Critical Care Critical Care Documentation Critical care time provided by me (excluding procedures): 36 minutes  Condition necessitating critical care: concern for sepsis  Components of critical care management: reviewing of prior records, laboratory and imaging interpretation, frequent re-examination and reassessment of vital signs, initiation of code sepsis protocol, administration of IV fluids, IV antibiotics, discussion with consulting services.    ED Course and Medical Decision Making  I have reviewed the triage vital signs and the nursing notes.  Pertinent labs & imaging results that were available during my care of the patient were reviewed by me and considered in my medical decision making (see below for details).  Considering metabolic disarray, stroke, small bowel obstruction, ACS in this 78 year old male with weakness, chest/rib/abdominal pain, and evidence of neurological deficit on exam.  Patient's blood pressure is soft in the 63J systolic, will provide small fluid bolus and evaluate closely.  Clinical Course as of Dec 22 1005  Tue Dec 23, 2018  4970 Patient continues to have low blood pressure, technically hypotensive on last read 85/67.  With this and his marked leukocytosis, will initiate code sepsis protocol, provide additional IV fluids, IV antibiotics.   [MB]    Clinical Course User Index [MB] Maudie Flakes, MD    Patient is fluid responsive, currently 97 systolic.  Admitted to hospitalist stepdown unit for further care of  pneumonia with possible sepsis.  Hospitalist informed of patient's abnormal CT head, will defer any follow-up for such to the hospital service.  Barth Kirks. [place] Small, [place] [email or web address]  Final Clinical Impressions(s) / ED Diagnoses     ICD-10-CM   1. Sepsis, due to unspecified organism, unspecified whether acute organ dysfunction present ([place]) A41.9   2. Rib pain on left side R07.81 DG Chest 2 View    DG Chest 2 View  3. Community acquired pneumonia of left lung, unspecified part of lung J18.9   4. Abnormal head CT R93.0     ED Discharge Orders    None         Maudie Flakes, MD 12/29/2018 1007

## 2018-12-23 NOTE — Progress Notes (Signed)
Dr Roderic Palau made aware of patient's Blood pressures and of complaint of left sided mid-lower rib area pain. PRN tylenol given and patient repositioned with some positive effect.

## 2018-12-23 NOTE — ED Notes (Signed)
Pyxis is out of Rocephin. Pharmacy bringing up some STAT

## 2018-12-23 NOTE — ED Notes (Signed)
Pt back from CT

## 2018-12-23 NOTE — Progress Notes (Signed)
Pharmacy Antibiotic Note  Brewster Wolters is a 78 y.o. male admitted on 01/11/2019 with pneumonia.  Pharmacy has been consulted for vancomycin and cefepime dosing.  Plan: Vancomycin 1000mg  IV every 12 hours.  Goal trough 15-20 mcg/mL. cefepime 2gm iv q12h  Height: 5\' 9"  (175.3 cm) Weight: 166 lb 14.2 oz (75.7 kg) IBW/kg (Calculated) : 70.7  Temp (24hrs), Avg:97.5 F (36.4 C), Min:97 F (36.1 C), Max:98 F (36.7 C)  Recent Labs  Lab 12/29/2018 0833  WBC 24.8*  CREATININE 0.54*  LATICACIDVEN 1.4    Estimated Creatinine Clearance: 74.9 mL/min (A) (by C-G formula based on SCr of 0.54 mg/dL (L)).    No Known Allergies  Antimicrobials this admission: 4/7 vancomycin >> 4/7 cefepime >>    Microbiology results: 4/7 BCx: sent  4/7 MRSA PCR: sent   Thank you for allowing pharmacy to be a part of this patient's care.  Donna Christen Lowery [place] 01/03/2019 3:48 PM

## 2018-12-23 NOTE — ED Triage Notes (Signed)
EMS reports pt recently was at [facility].  EMS says pt was there for a fall and having difficulty walking.  Today ems says pt wanted to come to er to get help with walking.  Pt also c/o left rib pain.  Denies cough or fever.  EMS says family told them they couldn't care for him at home anymore.

## 2018-12-24 ENCOUNTER — Inpatient Hospital Stay: Payer: Self-pay

## 2018-12-24 DIAGNOSIS — I959 Hypotension, unspecified: Secondary | ICD-10-CM | POA: Diagnosis present

## 2018-12-24 LAB — BASIC METABOLIC PANEL
Anion gap: 7 (ref 5–15)
BUN: 11 mg/dL (ref 8–23)
CO2: 27 mmol/L (ref 22–32)
Calcium: 6.6 mg/dL — ABNORMAL LOW (ref 8.9–10.3)
Chloride: 96 mmol/L — ABNORMAL LOW (ref 98–111)
Creatinine, Ser: 0.36 mg/dL — ABNORMAL LOW (ref 0.61–1.24)
GFR calc Af Amer: >60 mL/min (ref >60)
GFR calc non Af Amer: >60 mL/min (ref >60)
Glucose, Bld: 77 mg/dL (ref 70–99)
Potassium: 3.2 mmol/L — ABNORMAL LOW (ref 3.5–5.1)
Sodium: 130 mmol/L — ABNORMAL LOW (ref 135–145)

## 2018-12-24 LAB — CBC
HCT: 23.8 % — ABNORMAL LOW (ref 39.0–52.0)
HCT: 23.2 % — ABNORMAL LOW (ref 39.0–52.0)
Hemoglobin: 7.5 g/dL — ABNORMAL LOW (ref 13.0–17.0)
Hemoglobin: 7.6 g/dL — ABNORMAL LOW (ref 13.0–17.0)
MCH: 27.9 pg (ref 26.0–34.0)
MCH: 28.3 pg (ref 26.0–34.0)
MCHC: 31.5 g/dL (ref 30.0–36.0)
MCHC: 32.8 g/dL (ref 30.0–36.0)
MCV: 88.5 fL (ref 80.0–100.0)
MCV: 86.2 fL (ref 80.0–100.0)
Platelets: 158 10*3/uL (ref 150–400)
Platelets: 154 10*3/uL (ref 150–400)
RBC: 2.69 MIL/uL — ABNORMAL LOW (ref 4.22–5.81)
RBC: 2.69 MIL/uL — ABNORMAL LOW (ref 4.22–5.81)
RDW: 16.8 % — ABNORMAL HIGH (ref 11.5–15.5)
RDW: 16.6 % — ABNORMAL HIGH (ref 11.5–15.5)
WBC: 19.4 10*3/uL — ABNORMAL HIGH (ref 4.0–10.5)
WBC: 19.2 10*3/uL — ABNORMAL HIGH (ref 4.0–10.5)
nRBC: 0 % (ref 0.0–0.2)
nRBC: 0 % (ref 0.0–0.2)

## 2018-12-24 LAB — COMPREHENSIVE METABOLIC PANEL
ALT: 95 U/L — ABNORMAL HIGH (ref 0–44)
AST: 66 U/L — ABNORMAL HIGH (ref 15–41)
Albumin: 2 g/dL — ABNORMAL LOW (ref 3.5–5.0)
Alkaline Phosphatase: 85 U/L (ref 38–126)
Anion gap: 7 (ref 5–15)
BUN: 15 mg/dL (ref 8–23)
CO2: 29 mmol/L (ref 22–32)
Calcium: 6.7 mg/dL — ABNORMAL LOW (ref 8.9–10.3)
Chloride: 96 mmol/L — ABNORMAL LOW (ref 98–111)
Creatinine, Ser: 0.36 mg/dL — ABNORMAL LOW (ref 0.61–1.24)
GFR calc Af Amer: >60 mL/min (ref >60)
GFR calc non Af Amer: >60 mL/min (ref >60)
Glucose, Bld: 73 mg/dL (ref 70–99)
Potassium: 2.1 mmol/L — CL (ref 3.5–5.1)
Sodium: 132 mmol/L — ABNORMAL LOW (ref 135–145)
Total Bilirubin: 1 mg/dL (ref 0.3–1.2)
Total Protein: 4.2 g/dL — ABNORMAL LOW (ref 6.5–8.1)

## 2018-12-24 LAB — IRON AND TIBC: Iron: 5 ug/dL — ABNORMAL LOW (ref 45–182)

## 2018-12-24 LAB — VITAMIN B12: Vitamin B-12: 655 pg/mL (ref 180–914)

## 2018-12-24 LAB — MRSA PCR SCREENING: MRSA by PCR: NEGATIVE

## 2018-12-24 LAB — RETICULOCYTES
Immature Retic Fract: 13 % (ref 2.3–15.9)
RBC.: 2.66 MIL/uL — ABNORMAL LOW (ref 4.22–5.81)
Retic Count, Absolute: 55.3 10*3/uL (ref 19.0–186.0)
Retic Ct Pct: 2.1 % (ref 0.4–3.1)

## 2018-12-24 LAB — FERRITIN: Ferritin: 610 ng/mL — ABNORMAL HIGH (ref 24–336)

## 2018-12-24 LAB — MAGNESIUM: Magnesium: 1.2 mg/dL — ABNORMAL LOW (ref 1.7–2.4)

## 2018-12-24 LAB — FOLATE: Folate: 4.7 ng/mL — ABNORMAL LOW (ref >5.9)

## 2018-12-24 MED ORDER — SODIUM CHLORIDE 0.9% FLUSH
10.0000 mL | Freq: Two times a day (BID) | INTRAVENOUS | Status: DC
  Administered 2018-12-24 – 2018-12-30 (×10): 10 mL

## 2018-12-24 MED ORDER — POTASSIUM CHLORIDE 10 MEQ/100ML IV SOLN
10.0000 meq | INTRAVENOUS | Status: AC
  Administered 2018-12-24 (×4): 10 meq via INTRAVENOUS
  Filled 2018-12-24 (×3): qty 100

## 2018-12-24 MED ORDER — SODIUM CHLORIDE 0.9 % IV BOLUS
250.0000 mL | Freq: Once | INTRAVENOUS | Status: AC
  Administered 2018-12-24: 250 mL via INTRAVENOUS

## 2018-12-24 MED ORDER — POTASSIUM CHLORIDE CRYS ER 20 MEQ PO TBCR
40.0000 meq | EXTENDED_RELEASE_TABLET | Freq: Two times a day (BID) | ORAL | Status: AC
  Administered 2018-12-24 (×2): 40 meq via ORAL
  Filled 2018-12-24 (×2): qty 2

## 2018-12-24 MED ORDER — SODIUM CHLORIDE 0.9 % IV BOLUS
500.0000 mL | Freq: Once | INTRAVENOUS | Status: AC
  Administered 2018-12-24: 500 mL via INTRAVENOUS

## 2018-12-24 MED ORDER — SODIUM CHLORIDE 0.9% FLUSH: 10.0000 mL | INTRAVENOUS | Status: DC | PRN

## 2018-12-24 MED ORDER — POTASSIUM CHLORIDE 10 MEQ/100ML IV SOLN
10.0000 meq | INTRAVENOUS | Status: AC
  Administered 2018-12-24 (×6): 10 meq via INTRAVENOUS
  Filled 2018-12-24 (×6): qty 100

## 2018-12-24 MED ORDER — CHLORHEXIDINE GLUCONATE CLOTH 2 % EX PADS
6.0000 | MEDICATED_PAD | Freq: Every day | CUTANEOUS | Status: DC
  Administered 2018-12-24 – 2018-12-30 (×6): 6 via TOPICAL

## 2018-12-24 MED ORDER — PHENYLEPHRINE HCL-NACL 10-0.9 MG/250ML-% IV SOLN
0.0000 ug/min | INTRAVENOUS | Status: DC
  Administered 2018-12-24: 20 ug/min via INTRAVENOUS
  Filled 2018-12-24 (×2): qty 250

## 2018-12-24 MED ORDER — MAGNESIUM SULFATE 4 GM/100ML IV SOLN
4.0000 g | Freq: Once | INTRAVENOUS | Status: AC
  Administered 2018-12-24: 4 g via INTRAVENOUS
  Filled 2018-12-24: qty 100

## 2018-12-24 MED ORDER — CHLORHEXIDINE GLUCONATE CLOTH 2 % EX PADS
6.0000 | MEDICATED_PAD | Freq: Every day | CUTANEOUS | Status: DC
  Administered 2018-12-24 – 2018-12-29 (×5): 6 via TOPICAL

## 2018-12-24 NOTE — Evaluation (Signed)
Physical Therapy Evaluation Patient Details Name: Joshua Mckee MRN: [id number] DOB: January 05, 1941 Today's Date: 12/24/2018   History of Present Illness  Joshua Mckee is a 78 y.o. male with medical history significant of previous CVA, seizure disorder, recent admission for fall and rhabdomyolysis, patient on 3/9 to skilled nursing facility.  His wife reports that he is returning home over the last 10 days.  The patient has been in a wheelchair since his discharge from the hospital.  She reports that since returning home, he is been complaining of left rib pain.  He is also had generalized weakness for the past several days.  His p.o. intake has been poor for quite some time now.  He has not had any fevers.  He has not had any cough.  No nausea or vomiting.  He is moving his bowels and passing gas.    Clinical Impression  Patient demonstrates slow labored movement for sitting up at bedside with much assistance, unable to fully stand using RW due to BLE weakness with buckling of knees.  Patient able to slightly lift bottom off bed when attempting sit to stands and require Max assist to reposition when put back to bed.  Patient will benefit from continued physical therapy in hospital and recommended venue below to increase strength, balance, endurance for safe ADLs and gait.    Follow Up Recommendations SNF;Supervision/Assistance - 24 hour;Supervision for mobility/OOB    Equipment Recommendations  None recommended by PT    Recommendations for Other Services       Precautions / Restrictions Precautions Precautions: Fall Restrictions Weight Bearing Restrictions: No      Mobility  Bed Mobility Overal bed mobility: Needs Assistance Bed Mobility: Supine to Sit;Sit to Supine     Supine to sit: Max assist Sit to supine: Max assist   General bed mobility comments: labored movement, increased time  Transfers Overall transfer level: Needs assistance Equipment used: Rolling walker (2  wheeled) Transfers: Sit to/from Stand Sit to Stand: Max assist         General transfer comment: unable to lock knees due to weakness, able to slightly lift bottom off bed  Ambulation/Gait                Stairs            Wheelchair Mobility    Modified Rankin (Stroke Patients Only)       Balance Overall balance assessment: Needs assistance Sitting-balance support: Feet supported;Bilateral upper extremity supported Sitting balance-Leahy Scale: Poor Sitting balance - Comments: fair/poor, occasional leaning backwards Postural control: Posterior lean                                   Pertinent Vitals/Pain Pain Assessment: Faces Faces Pain Scale: Hurts little more Pain Location: pressure to BLE Pain Descriptors / Indicators: Sore;Grimacing;Guarding Pain Intervention(s): Limited activity within patient's tolerance;Monitored during session    Home Living Family/patient expects to be discharged to:: Private residence Living Arrangements: Spouse/significant other Available Help at Discharge: Available PRN/intermittently;Friend(s) Type of Home: Apartment Home Access: Level entry     Home Layout: One level Home Equipment: Environmental consultant - 2 wheels;Cane - single point;Bedside commode;Shower seat      Prior Function Level of Independence: Needs assistance   Gait / Transfers Assistance Needed: was household ambulator with RW, for last few months assisted transfers, non-ambulatory  ADL's / Homemaking Assistance Needed: assisted by family  Hand Dominance   Dominant Hand: Right    Extremity/Trunk Assessment   Upper Extremity Assessment Upper Extremity Assessment: Generalized weakness    Lower Extremity Assessment Lower Extremity Assessment: Generalized weakness    Cervical / Trunk Assessment Cervical / Trunk Assessment: Normal  Communication   Communication: No difficulties  Cognition Arousal/Alertness: Awake/alert Behavior During  Therapy: WFL for tasks assessed/performed Overall Cognitive Status: Within Functional Limits for tasks assessed                                        General Comments      Exercises     Assessment/Plan    PT Assessment Patient needs continued PT services  PT Problem List Decreased strength;Decreased activity tolerance;Decreased balance;Decreased mobility       PT Treatment Interventions Therapeutic exercise;Gait training;Stair training;Functional mobility training;Therapeutic activities;Patient/family education    PT Goals (Current goals can be found in the Care Plan section)  Acute Rehab PT Goals Patient Stated Goal: return home PT Goal Formulation: With patient Time For Goal Achievement: 01/07/19 Potential to Achieve Goals: Fair    Frequency Min 3X/week   Barriers to discharge        Co-evaluation               AM-PAC PT "6 Clicks" Mobility  Outcome Measure Help needed turning from your back to your side while in a flat bed without using bedrails?: A Lot Help needed moving from lying on your back to sitting on the side of a flat bed without using bedrails?: A Lot Help needed moving to and from a bed to a chair (including a wheelchair)?: Total Help needed standing up from a chair using your arms (e.g., wheelchair or bedside chair)?: Total Help needed to walk in hospital room?: Total Help needed climbing 3-5 steps with a railing? : Total 6 Click Score: 8    End of Session   Activity Tolerance: Patient limited by fatigue;Patient tolerated treatment well Patient left: in bed;with call bell/phone within reach Nurse Communication: Mobility status PT Visit Diagnosis: Unsteadiness on feet (R26.81);Other abnormalities of gait and mobility (R26.89);Muscle weakness (generalized) (M62.81)    Time: 9604-5409 PT Time Calculation (min) (ACUTE ONLY): 29 min   Charges:   PT Evaluation $PT Eval Moderate Complexity: 1 Mod PT Treatments $Therapeutic  Activity: 23-37 mins        1:40 PM, 12/24/18 [name], MPT Physical Therapist with [facility] 336 [phone] office [phone] mobile phone

## 2018-12-24 NOTE — NC FL2 (Signed)
[place] MEDICAID FL2 LEVEL OF CARE SCREENING TOOL     IDENTIFICATION  Patient Name: Joshua Mckee Birthdate: 1941-07-29 Sex: male Admission Date (Current Location): 01/03/2019  [facility] and [place] Number:  [employer] and Address:  [facility] [facility] [address] [place]      Provider Number: [id number]  Attending Physician Name and Address:  Kathie Dike, MD  Relative Name and Phone Number:       Current Level of Care: Hospital Recommended Level of Care: [place] Prior Approval Number:    Date Approved/Denied:   PASRR Number: [id number]  Discharge Plan: SNF    Current Diagnoses: Patient Active Problem List   Diagnosis Date Noted  . Hypotension 12/24/2018  . PNA (pneumonia) 01/14/2019  . Seizure disorder ([place]) 12/20/2018  . Hypoalbuminemia 12/21/2018  . Anasarca 12/26/2018  . Pleural effusion on left 12/21/2018  . Sepsis ([place]) 01/15/2019  . Rhabdomyolysis 11/21/2018  . Hypokalemia 11/12/2018  . Small bowel obstruction due to adhesions ([place])   . AKI (acute kidney injury) ([place]) 09/15/2018  . SBO (small bowel obstruction) ([place]) 09/12/2018  . High transaminase levels 09/12/2018  . History of CVA (cerebrovascular accident) 08/28/2018  . Weakness 08/28/2018  . Overweight (BMI 25.0-29.9) 06/02/2018  . Alcoholism in remission ([place]) 06/13/2017  . Prostate cancer ([place]) 03/06/2017  . Hyperlipidemia 11/30/2016  . Seizure ([place]) 10/15/2016  . History of recent stroke 08/27/2016  . Hypertension 08/27/2016  . Esophageal ulcer 02/27/2016  . GI bleed 12/15/2015  . Reflux esophagitis   . Hiatal hernia     Orientation RESPIRATION BLADDER Height & Weight     Self, Situation, Place  Normal Incontinent Weight: 170 lb 6.7 oz (77.3 kg) Height:  5\' 9"  (175.3 cm)  BEHAVIORAL SYMPTOMS/MOOD NEUROLOGICAL BOWEL NUTRITION STATUS      Continent Diet(see dc summary)  AMBULATORY STATUS COMMUNICATION OF NEEDS Skin   Extensive  Assist Verbally Normal                       Personal Care Assistance Level of Assistance  Bathing, Feeding, Dressing Bathing Assistance: Limited assistance Feeding assistance: Independent Dressing Assistance: Limited assistance     Functional Limitations Info  Sight, Hearing, Speech Sight Info: Adequate Hearing Info: Adequate Speech Info: Adequate    SPECIAL CARE FACTORS FREQUENCY  PT (By licensed PT)     PT Frequency: 5 times week              Contractures Contractures Info: Not present    Additional Factors Info  Code Status, Allergies Code Status Info: DNR Allergies Info: NKA           Current Medications (12/24/2018):  This is the current hospital active medication list Current Facility-Administered Medications  Medication Dose Route Frequency Provider Last Rate Last Dose  . acetaminophen (TYLENOL) tablet 650 mg  650 mg Oral Q6H PRN Kathie Dike, MD   650 mg at 01/09/2019 1750   Or  . acetaminophen (TYLENOL) suppository 650 mg  650 mg Rectal Q6H PRN Kathie Dike, MD      . albumin human 25 % solution 25 g  25 g Intravenous BID Kathie Dike, MD 60 mL/hr at 12/24/18 1012 25 g at 12/24/18 1012  . aspirin tablet 325 mg  325 mg Oral Q breakfast Kathie Dike, MD   325 mg at 12/24/18 0830  . ceFEPIme (MAXIPIME) 2 g in sodium chloride 0.9 % 100 mL IVPB  2 g Intravenous Q12H  Coffee, Donna Christen, RPH 200 mL/hr at 12/24/18 0303 2 g at 12/24/18 0303  . Chlorhexidine Gluconate Cloth 2 % PADS 6 each  6 each Topical Q0600 Kathie Dike, MD   6 each at 12/24/18 0615  . guaiFENesin (MUCINEX) 12 hr tablet 1,200 mg  1,200 mg Oral BID Kathie Dike, MD   1,200 mg at 12/24/18 0938  . heparin injection 5,000 Units  5,000 Units Subcutaneous Q8H Kathie Dike, MD   5,000 Units at 12/24/18 0530  . levETIRAcetam (KEPPRA) tablet 500 mg  500 mg Oral BID Kathie Dike, MD   500 mg at 12/24/18 8088  . midodrine (PROAMATINE) tablet 5 mg  5 mg Oral TID WC Kathie Dike, MD    5 mg at 12/24/18 1126  . ondansetron (ZOFRAN) tablet 4 mg  4 mg Oral Q6H PRN Kathie Dike, MD       Or  . ondansetron (ZOFRAN) injection 4 mg  4 mg Intravenous Q6H PRN Kathie Dike, MD      . pantoprazole (PROTONIX) EC tablet 40 mg  40 mg Oral Daily Kathie Dike, MD   40 mg at 12/24/18 0938  . potassium chloride 10 mEq in 100 mL IVPB  10 mEq Intravenous Q1 Hr x 6 Memon, Jehanzeb, MD 100 mL/hr at 12/24/18 1222 10 mEq at 12/24/18 1222  . potassium chloride SA (K-DUR,KLOR-CON) CR tablet 40 mEq  40 mEq Oral BID Jani Gravel, MD   40 mEq at 12/24/18 0940  . tamsulosin (FLOMAX) capsule 0.4 mg  0.4 mg Oral Daily Kathie Dike, MD   0.4 mg at 12/24/18 1103     Discharge Medications: Please see discharge summary for a list of discharge medications.  Relevant Imaging Results:  Relevant Lab Results:   Additional Information SSN: [id number], LCSW

## 2018-12-24 NOTE — Progress Notes (Signed)
Peripherally Inserted Central Catheter/Midline Placement  The IV Nurse has discussed with the patient and/or persons authorized to consent for the patient, the purpose of this procedure and the potential benefits and risks involved with this procedure.  The benefits include less needle sticks, lab draws from the catheter, and the patient may be discharged home with the catheter. Risks include, but not limited to, infection, bleeding, blood clot (thrombus formation), and puncture of an artery; nerve damage and irregular heartbeat and possibility to perform a PICC exchange if needed/ordered by physician.  Alternatives to this procedure were also discussed.  Bard Power PICC patient education guide, fact sheet on infection prevention and patient information card has been provided to patient /or left at bedside.    PICC/Midline Placement Documentation  PICC Double Lumen 12/24/18 PICC Right Brachial 35 cm 0 cm (Active)  Indication for Insertion or Continuance of Line Vasoactive infusions 12/24/2018  4:19 PM  Exposed Catheter (cm) 0 cm 12/24/2018  4:19 PM  Site Assessment Clean;Dry;Intact 12/24/2018  4:19 PM  Lumen #1 Status Flushed;Blood return noted;Saline locked 12/24/2018  4:19 PM  Lumen #2 Status Flushed;Heparin locked (Peds);Saline locked 12/24/2018  4:19 PM  Dressing Type Transparent 12/24/2018  4:19 PM  Dressing Status Clean;Dry;Intact;Antimicrobial disc in place 12/24/2018  4:19 PM  Dressing Change Due 2019/01/22 12/24/2018  4:19 PM       [address] 12/24/2018, 4:21 PM

## 2018-12-24 NOTE — TOC Initial Note (Signed)
Transition of Care [facility]) - Initial/Assessment Note    Patient Details  Name: Joshua Mckee MRN: [id number] Date of Birth: Nov 30, 1940  [facility] CM/SW Contact:    Shade Flood, LCSW Phone Number: 12/24/2018, 1:10 PM  Clinical Narrative:                 Pt evaluated by PT today. Pt was recently discharged from SNF after a rehab stay and PT recommending return to rehab at this time. Will refer to requested facilities.  Expected Discharge Plan: Skilled Nursing Facility Barriers to Discharge: Continued Medical Work up   Patient Goals and CMS Choice        Expected Discharge Plan and Services Expected Discharge Plan: [place] arrangements for the past 2 months: Apartment, [place]                          Prior Living Arrangements/Services Living arrangements for the past 2 months: Apartment, [place] Lives with:: Significant Other Patient language and need for interpreter reviewed:: Yes Do you feel safe going back to the place where you live?: Yes      Need for Family Participation in Patient Care: Yes (Comment) Care giver support system in place?: Yes (comment)   Criminal Activity/Legal Involvement Pertinent to Current Situation/Hospitalization: No - Comment as needed  Activities of Daily Living Home Assistive Devices/Equipment: Wheelchair ADL Screening (condition at time of admission) Patient's cognitive ability adequate to safely complete daily activities?: Yes Is the patient deaf or have difficulty hearing?: No Does the patient have difficulty seeing, even when wearing glasses/contacts?: No Does the patient have difficulty concentrating, remembering, or making decisions?: No Patient able to express need for assistance with ADLs?: Yes Does the patient have difficulty dressing or bathing?: No Independently performs ADLs?: Yes (appropriate for developmental age) Does the patient have  difficulty walking or climbing stairs?: Yes Weakness of Legs: Both Weakness of Arms/Hands: None  Permission Sought/Granted Permission sought to share information with : Chartered certified accountant granted to share information with : Yes, Verbal Permission Granted     Permission granted to share info w AGENCY: [facility], [facility]        Emotional Assessment Appearance:: Appears stated age     Orientation: : Oriented to Self, Oriented to Place Alcohol / Substance Use: Not Applicable Psych Involvement: No (comment)  Admission diagnosis:  Abnormal head CT [R93.0] Rib pain on left side [R07.81] Community acquired pneumonia of left lung, unspecified part of lung [J18.9] Sepsis, due to unspecified organism, unspecified whether acute organ dysfunction present [facility]) [A41.9] Patient Active Problem List   Diagnosis Date Noted  . Hypotension 12/24/2018  . PNA (pneumonia) 12/20/2018  . Seizure disorder ([place]) 01/14/2019  . Hypoalbuminemia 12/22/2018  . Anasarca 12/26/2018  . Pleural effusion on left 01/10/2019  . Sepsis ([place]) 01/06/2019  . Rhabdomyolysis 11/21/2018  . Hypokalemia 11/12/2018  . Small bowel obstruction due to adhesions ([place])   . AKI (acute kidney injury) ([place]) 09/15/2018  . SBO (small bowel obstruction) ([place]) 09/12/2018  . High transaminase levels 09/12/2018  . History of CVA (cerebrovascular accident) 08/28/2018  . Weakness 08/28/2018  . Overweight (BMI 25.0-29.9) 06/02/2018  . Alcoholism in remission ([place]) 06/13/2017  . Prostate cancer ([place]) 03/06/2017  . Hyperlipidemia 11/30/2016  . Seizure ([place]) 10/15/2016  . History of recent stroke 08/27/2016  . Hypertension 08/27/2016  . Esophageal ulcer 02/27/2016  . GI  bleed 12/15/2015  . Reflux esophagitis   . Hiatal hernia    PCP:  Dettinger, Fransisca Kaufmann, MD Pharmacy:   [facility], [address] [address] Phone: [phone] Fax: [phone]     Social  Determinants of Health (SDOH) Interventions    Readmission Risk Interventions No flowsheet data found.

## 2018-12-24 NOTE — Progress Notes (Signed)
Patient ID: Joshua Mckee, male   DOB: 11/26/1940, 78 y.o.   MRN: [id number]  Xcover Per RN sbp 79 Map 62 Concerned about hypotension.  No complaints of cp, palps, n/v.   Exam:  VS afebrile, T 97.5 P 88, R 20,  Bp 79/59 pox 97% Heent: anicteric Neck: no jvd Heart: rrr s1, s2 Lung: decrease in bs left base, crackles bilateral base, no wheeze Abd: soft, nt, +bs Ext: 1-2+ edema  Labs 4/7 procalcitonin 4.11 Wbc 24.8, Hgb 9.9 Blood culture x 2 pending cpk 1,003  A/P Hypotension likely secondary to sepsis/ pneumonia and baseline low bp  Ns 274ml iv x1 bolus, pt has already received about 2L fluid Cont Ns after bolus at 149ml per hour.  Continue vanco, cefepime iv  Critical care time 30 minutes

## 2018-12-24 NOTE — Progress Notes (Signed)
PROGRESS NOTE    Joshua Mckee  NOB:[id number] DOB: [birth year] DOA: 01/01/2019 PCP: Dettinger, Fransisca Kaufmann, MD    Brief Narrative:  78 year old male with a history of previous CVA, seizure disorder, recent admission for fall and rhabdomyolysis, who was sent to skilled nursing facility and has been home for the past 10 days prior to admission.  He presented with left upper quadrant/left chest pain.  Found to have left lower lobe pneumonia with pleural effusion.  He also had evidence of sepsis with WBC count of 24.8, hypotension.  He was admitted for intravenous antibiotics and IV fluid resuscitation.   Assessment & Plan:   Principal Problem:   Sepsis ([place]) Active Problems:   Reflux esophagitis   History of CVA (cerebrovascular accident)   Weakness   Rhabdomyolysis   PNA (pneumonia)   Seizure disorder ([facility])   Hypoalbuminemia   Anasarca   Pleural effusion on left   Hypotension   1. Sepsis.  Secondary to pneumonia.  Continues to have elevated WBC count.  Blood cultures have been sent.  Continue on IV antibiotics.  He is receiving IV fluids, but blood pressure remains low.  Can consider starting on vasopressors.  Check cortisol.  Check echocardiogram to assess LV function.  Review of prior records indicate that blood pressure normally runs in the 100-120 range.  2. Hypoalbuminemia with anasarca.  Nutrition consult. 3. Seizure disorder.  Continue on Keppra. 4. Healthcare associated pneumonia.  MRSA PCR is negative.  Discontinue vancomycin.  Continue cefepime. 5. Rhabdomyolysis.  Continue on IV fluids.  Recheck CK levels in a.m. 6. Left pleural effusion.  Likely parapneumonic.  He does not appear to be particularly short of breath. 7. Generalized weakness.  Seen by physical therapy with recommendation for skilled facility placement. 8. Hypokalemia.  Patient has severe hypokalemia and magnesium is also low.  This will be replaced. 9. Anemia.  I suspect this is related to chronic disease.  He  does not have any obvious blood loss.  Will check anemia panel.   DVT prophylaxis: Heparin Code Status: DNR Family Communication: No family present Disposition Plan: Discharge home once improved   Consultants:     Procedures:     Antimicrobials:   Vancomycin 4/7 > 4/8  Cefepime 4/7 >   Subjective: Denies any shortness of breath.  Had some left-sided rib pain yesterday, none today.  Blood pressure remained low overnight.  Objective: Vitals:   12/24/18 1400 12/24/18 1430 12/24/18 1600 12/24/18 1630  BP: 90/78 (!) 87/67 91/67 (!) 85/61  Pulse: 94 91 96 95  Resp: 18 17 (!) 22 19  Temp:      TempSrc:      SpO2: 97% 97% 96% 97%  Weight:      Height:        Intake/Output Summary (Last 24 hours) at 12/24/2018 1828 Last data filed at 12/24/2018 1525 Gross per 24 hour  Intake 1009.07 ml  Output 350 ml  Net 659.07 ml   Filed Weights   12/27/2018 0741 12/30/2018 1144 12/24/18 0400  Weight: 73.5 kg 75.7 kg 77.3 kg    Examination:  General exam: Appears calm and comfortable  Respiratory system: Crackles at left base. Respiratory effort normal. Cardiovascular system: S1 & S2 heard, RRR. No JVD, murmurs, rubs, gallops or clicks. 1-2+ anasarca. Gastrointestinal system: Abdomen is nondistended, soft and nontender. No organomegaly or masses felt. Normal bowel sounds heard. Central nervous system: Alert and oriented. No focal neurological deficits. Extremities: Symmetric 5 x 5 power. Skin: No rashes,  lesions or ulcers Psychiatry: Judgement and insight appear normal. Mood & affect appropriate.     Data Reviewed: I have personally reviewed following labs and imaging studies  CBC: Recent Labs  Lab 01/09/2019 0833 12/24/18 0411  WBC 24.8* 19.4*  HGB 9.9* 7.5*  HCT 31.0* 23.8*  MCV 88.3 88.5  PLT 192 756   Basic Metabolic Panel: Recent Labs  Lab 01/05/2019 0833 12/24/18 0411  NA 135 132*  K 3.2* 2.1*  CL 94* 96*  CO2 33* 29  GLUCOSE 62* 73  BUN 17 15  CREATININE  0.54* 0.36*  CALCIUM 7.0* 6.7*  MG  --  1.2*   GFR: Estimated Creatinine Clearance: 74.9 mL/min (A) (by C-G formula based on SCr of 0.36 mg/dL (L)). Liver Function Tests: Recent Labs  Lab 12/21/2018 0833 12/24/18 0411  AST 115* 66*  ALT 149* 95*  ALKPHOS 122 85  BILITOT 1.1 1.0  PROT 4.7* 4.2*  ALBUMIN 1.6* 2.0*   Recent Labs  Lab 12/27/2018 0833  LIPASE 15   No results for input(s): AMMONIA in the last 168 hours. Coagulation Profile: Recent Labs  Lab 01/12/2019 1514  INR 1.4*   Cardiac Enzymes: Recent Labs  Lab 12/21/2018 0833  CKTOTAL 1,003*  TROPONINI <0.03   BNP (last 3 results) No results for input(s): PROBNP in the last 8760 hours. HbA1C: No results for input(s): HGBA1C in the last 72 hours. CBG: No results for input(s): GLUCAP in the last 168 hours. Lipid Profile: No results for input(s): CHOL, HDL, LDLCALC, TRIG, CHOLHDL, LDLDIRECT in the last 72 hours. Thyroid Function Tests: No results for input(s): TSH, T4TOTAL, FREET4, T3FREE, THYROIDAB in the last 72 hours. Anemia Panel: No results for input(s): VITAMINB12, FOLATE, FERRITIN, TIBC, IRON, RETICCTPCT in the last 72 hours. Sepsis Labs: Recent Labs  Lab 12/25/2018 4332 01/09/2019 1514  PROCALCITON  --  4.11  LATICACIDVEN 1.4  --     Recent Results (from the past 240 hour(s))  Culture, blood (single)     Status: None (Preliminary result)   Collection Time: 12/21/2018  8:33 AM  Result Value Ref Range Status   Specimen Description BLOOD LEFT ARM  Final   Special Requests   Final    BOTTLES DRAWN AEROBIC AND ANAEROBIC Blood Culture adequate volume   Culture   Final    NO GROWTH < 24 HOURS Performed at [facility], [address] [place], [place] [address]    Report Status PENDING  Incomplete  Culture, blood (single)     Status: None (Preliminary result)   Collection Time: 01/09/2019  9:07 AM  Result Value Ref Range Status   Specimen Description RIGHT ANTECUBITAL  Final   Special Requests   Final     BOTTLES DRAWN AEROBIC AND ANAEROBIC Blood Culture adequate volume   Culture   Final    NO GROWTH < 24 HOURS Performed at [facility], [address] [place], [place] [address]    Report Status PENDING  Incomplete  MRSA PCR Screening     Status: None   Collection Time: 12/24/2018 11:51 AM  Result Value Ref Range Status   MRSA by PCR NEGATIVE NEGATIVE Final    Comment:        The GeneXpert MRSA Assay (FDA approved for NASAL specimens only), is one component of a comprehensive MRSA colonization surveillance program. It is not intended to diagnose MRSA infection nor to guide or monitor treatment for MRSA infections. Performed at [facility], [address], [place], [place] [address]  Radiology Studies: Ct Abdomen Pelvis Wo Contrast  Result Date: 12/21/2018 CLINICAL DATA:  Left upper quadrant abdominal pain. Multiple recent falls. Generalized weakness. EXAM: CT ABDOMEN AND PELVIS WITHOUT CONTRAST TECHNIQUE: Multidetector CT imaging of the abdomen and pelvis was performed following the standard protocol without IV contrast. COMPARISON:  11/12/2018 FINDINGS: Lower chest: New small to moderate volume left pleural effusion with associated left basilar airspace density suspicious for pneumonia. There also may be mild infiltrate at the right lung base with a trace amount of right pleural fluid. Hepatobiliary: Stable cysts scattered throughout the liver parenchyma. The gallbladder appears unremarkable. No biliary dilatation. Pancreas: Unremarkable. No pancreatic ductal dilatation or surrounding inflammatory changes. Spleen: Normal in size without focal abnormality. No evidence of perisplenic hemorrhage. Adrenals/Urinary Tract: Adrenal glands are unremarkable. Kidneys are normal, without renal calculi, focal lesion, or hydronephrosis. Stable cyst of the lower pole of the left kidney. Bladder is unremarkable. Stomach/Bowel: There is less prominent dilatation of the small bowel compared to the  prior study. There are some remaining dilated small bowel loops as well as at least one loop of thickened small bowel in the left mid to lower abdomen. No evidence of free air. 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 No enlarged abdominal or pelvic lymph nodes. Reproductive: Prostate is unremarkable. Other: No abdominal wall hernia or abnormality. No focal abscess or ascites. Musculoskeletal: No fractures identified. Mild degenerative disc disease is seen throughout the spine. IMPRESSION: 1. New small to moderate left pleural effusion with associated left lower lobe airspace disease suspicious for pneumonia. There may also be a mild infiltrate at the right lung base. 2. Most prominent small bowel dilatation compared to the prior study. There remains some dilated small bowel loops with at least a single thickened loop of small bowel in the left mid to lower abdomen. No evidence of free air or focal abscess. 3. 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 Electronically Signed   By: Aletta Edouard M.D.   On: 01/01/2019 11:53   Dg Chest 2 View  Result Date: 01/03/2019 CLINICAL DATA:  Fall with difficulty walking.  Left rib pain. EXAM: CHEST - 2 VIEW COMPARISON:  11/12/2018 FINDINGS: Lungs are hypoinflated with opacification over the left base suggesting layering moderate effusion with associated basilar atelectasis. Infection in the left base is possible. Right lung is clear. Mild stable cardiomegaly. No acute left rib fracture. Remainder of the exam is unchanged. IMPRESSION: Opacification over the left mid to lower lung likely moderate size layering effusion with associated basilar atelectasis. Infection in the left mid to lower lung is possible. No acute fracture. Electronically Signed   By: Marin Olp M.D.   On: 01/11/2019 08:57   Ct Head Wo Contrast  Result Date: 01/09/2019 CLINICAL DATA:  Unable to walk,  falls, weakness EXAM: CT HEAD WITHOUT CONTRAST TECHNIQUE: Contiguous axial images were obtained from the base of the skull through the vertex without intravenous contrast. COMPARISON:  11/12/2018 FINDINGS: Brain: No evidence of acute infarction, hemorrhage, hydrocephalus, extra-axial collection or mass lesion/mass effect. Redemonstrated enlarged lateral ventricles and periventricular white matter hypodensity, unchanged in caliber and configuration compared to prior examination Vascular: No hyperdense vessel or unexpected calcification. Skull: Normal. Negative for fracture or focal lesion. Sinuses/Orbits: No acute finding. Other: None. IMPRESSION: No acute intracranial pathology. Redemonstrated enlarged lateral ventricles and periventricular white matter hypodensity, unchanged in caliber and configuration compared to prior examination. Findings likely reflect small-vessel white matter disease  and global volume loss with ventricular enlargement ex vacuo, however normal pressure hydrocephalus remains a differential consideration with this appearance, particularly given stated clinical presentation of inability to walk and falls. Electronically Signed   By: Eddie Candle M.D.   On: 12/28/2018 09:18   [place] Ekg Site Rite  Result Date: 12/24/2018 If Site Rite image not attached, placement could not be confirmed due to current cardiac rhythm.       Scheduled Meds:  aspirin  325 mg Oral Q breakfast   Chlorhexidine Gluconate Cloth  6 each Topical Q0600   Chlorhexidine Gluconate Cloth  6 each Topical Daily   guaiFENesin  1,200 mg Oral BID   heparin  5,000 Units Subcutaneous Q8H   levETIRAcetam  500 mg Oral BID   midodrine  5 mg Oral TID WC   pantoprazole  40 mg Oral Daily   potassium chloride  40 mEq Oral BID   sodium chloride flush  10-40 mL Intracatheter Q12H   tamsulosin  0.4 mg Oral Daily   Continuous Infusions:  albumin human Stopped (12/24/18 1202)   ceFEPime (MAXIPIME) IV 2 g (12/24/18  1540)   phenylephrine (NEO-SYNEPHRINE) Adult infusion     potassium chloride 10 mEq (12/24/18 1746)   sodium chloride       LOS: 1 day    Time spent: 40mins    Kathie Dike, MD [facility]   If 7PM-7AM, please contact night-coverage [email or web address]  12/24/2018, 6:28 PM

## 2018-12-24 NOTE — Progress Notes (Signed)
Dr. Roderic Palau made aware of patient systolic blood pressures being in the 80's and occasionally in the 90's. Patient asymptomatic with no complaints and alert and oriented x4. Denies shortness of breath, chest pain or discomfort. Dr. Roderic Palau also made aware of total urine output since 7am was 387ml via condom catheter. New orders to be placed per Dr. Roderic Palau.

## 2018-12-24 NOTE — Plan of Care (Signed)
  Problem: Acute Rehab PT Goals(only PT should resolve) Goal: Pt Will Go Supine/Side To Sit Outcome: Progressing Flowsheets (Taken 12/24/2018 1341) Pt will go Supine/Side to Sit: with moderate assist Goal: Patient Will Transfer Sit To/From Stand Outcome: Progressing Flowsheets (Taken 12/24/2018 1341) Patient will transfer sit to/from stand: with moderate assist; with maximum assist Goal: Pt Will Transfer Bed To Chair/Chair To Bed Outcome: Progressing Flowsheets (Taken 12/24/2018 1341) Pt will Transfer Bed to Chair/Chair to Bed: with max assist Goal: Pt Will Ambulate Outcome: Progressing Flowsheets (Taken 12/24/2018 1341) Pt will Ambulate: 10 feet; with maximum assist; with rolling walker   1:41 PM, 12/24/18 [name], MPT Physical Therapist with [facility] [phone] office [phone] mobile phone

## 2018-12-25 ENCOUNTER — Ambulatory Visit (INDEPENDENT_AMBULATORY_CARE_PROVIDER_SITE_OTHER): Payer: Medicare Other | Admitting: Family Medicine

## 2018-12-25 LAB — CBC
HCT: 23.9 % — ABNORMAL LOW (ref 39.0–52.0)
Hemoglobin: 7.7 g/dL — ABNORMAL LOW (ref 13.0–17.0)
MCH: 27.4 pg (ref 26.0–34.0)
MCHC: 32.2 g/dL (ref 30.0–36.0)
MCV: 85.1 fL (ref 80.0–100.0)
Platelets: 160 10*3/uL (ref 150–400)
RBC: 2.81 MIL/uL — ABNORMAL LOW (ref 4.22–5.81)
RDW: 16 % — ABNORMAL HIGH (ref 11.5–15.5)
WBC: 18.5 10*3/uL — ABNORMAL HIGH (ref 4.0–10.5)
nRBC: 0 % (ref 0.0–0.2)

## 2018-12-25 LAB — COMPREHENSIVE METABOLIC PANEL
ALT: 82 U/L — ABNORMAL HIGH (ref 0–44)
AST: 53 U/L — ABNORMAL HIGH (ref 15–41)
Albumin: 2.3 g/dL — ABNORMAL LOW (ref 3.5–5.0)
Alkaline Phosphatase: 94 U/L (ref 38–126)
Anion gap: 7 (ref 5–15)
BUN: 10 mg/dL (ref 8–23)
CO2: 29 mmol/L (ref 22–32)
Calcium: 7.2 mg/dL — ABNORMAL LOW (ref 8.9–10.3)
Chloride: 98 mmol/L (ref 98–111)
Creatinine, Ser: 0.33 mg/dL — ABNORMAL LOW (ref 0.61–1.24)
GFR calc Af Amer: >60 mL/min (ref >60)
GFR calc non Af Amer: >60 mL/min (ref >60)
Glucose, Bld: 68 mg/dL — ABNORMAL LOW (ref 70–99)
Potassium: 3.5 mmol/L (ref 3.5–5.1)
Sodium: 134 mmol/L — ABNORMAL LOW (ref 135–145)
Total Bilirubin: 1.2 mg/dL (ref 0.3–1.2)
Total Protein: 4.4 g/dL — ABNORMAL LOW (ref 6.5–8.1)

## 2018-12-25 LAB — CK: Total CK: 389 U/L (ref 49–397)

## 2018-12-25 LAB — MAGNESIUM: Magnesium: 1.9 mg/dL (ref 1.7–2.4)

## 2018-12-25 MED ORDER — LORAZEPAM 2 MG/ML IJ SOLN
0.5000 mg | INTRAMUSCULAR | Status: DC | PRN
  Administered 2018-12-25 – 2018-12-30 (×12): 0.5 mg via INTRAVENOUS
  Filled 2018-12-25 (×12): qty 1

## 2018-12-25 MED ORDER — SODIUM CHLORIDE 0.9 % IV SOLN
INTRAVENOUS | Status: DC | PRN
  Administered 2018-12-25: 200 mL via INTRAVENOUS
  Administered 2018-12-26 – 2018-12-28 (×3): 250 mL via INTRAVENOUS

## 2018-12-25 MED ORDER — SODIUM CHLORIDE 0.9 % IV SOLN
INTRAVENOUS | Status: DC
  Administered 2018-12-25: 17:00:00 via INTRAVENOUS

## 2018-12-25 NOTE — Progress Notes (Signed)
Pt requesting his money that was placed in a safe with security, back. He wants a portion of it to be handed to [place] and wants to keep the rest with him in his room. Security made aware.

## 2018-12-25 NOTE — Progress Notes (Signed)
PROGRESS NOTE    Joshua Mckee  OIN:[id number] DOB: 06-01-1941 DOA: 01/05/2019 PCP: Dettinger, Fransisca Kaufmann, MD    Brief Narrative:  78 year old male with a history of previous CVA, seizure disorder, recent admission for fall and rhabdomyolysis, who was sent to skilled nursing facility and has been home for the past 10 days prior to admission.  He presented with left upper quadrant/left chest pain.  Found to have left lower lobe pneumonia with pleural effusion.  He also had evidence of sepsis with WBC count of 24.8, hypotension.  He was admitted for intravenous antibiotics and IV fluid resuscitation.   Assessment & Plan:   Principal Problem:   Sepsis ([place]) Active Problems:   Reflux esophagitis   History of CVA (cerebrovascular accident)   Weakness   Rhabdomyolysis   PNA (pneumonia)   Seizure disorder (HCC)   Hypoalbuminemia   Anasarca   Pleural effusion on left   Hypotension   1. Sepsis.  Secondary to pneumonia.  Continues to have elevated WBC count.  Blood cultures have been sent.  Continue on IV antibiotics.  He is receiving IV fluids, but blood pressure remains low.  Briefly required vasopressors overnight.  Check cortisol.  Check limited echocardiogram to assess LV function.  Review of prior records indicate that blood pressure normally runs in the 100-120 range.  2. Hypoalbuminemia with anasarca.  Nutrition consult. 3. Seizure disorder.  Continue on Keppra. 4. Healthcare associated pneumonia.  MRSA PCR is negative.  Discontinue vancomycin.  Continue cefepime. 5. Rhabdomyolysis.  Continue on IV fluids.  Repeat CK levels improved 6. Left pleural effusion.  Likely parapneumonic.  He does not appear to be particularly short of breath. 7. Generalized weakness.  Seen by physical therapy with recommendation for skilled facility placement. 8. Hypokalemia.  Replaced.  Magnesium is also been replaced. 9. Anemia.  Anemia panel indicates iron deficiency.  Check stool for occult blood.  No  gross signs of bleeding.   DVT prophylaxis: Heparin Code Status: DNR Family Communication: No family present Disposition Plan: Discharge home once improved   Consultants:     Procedures:     Antimicrobials:   Vancomycin 4/7 > 4/8  Cefepime 4/7 >   Subjective: Denies any further left rib pain.  Does not have a cough.  Briefly required vasopressors overnight  Objective: Vitals:   12/25/18 1300 12/25/18 1400 12/25/18 1601 12/25/18 1700  BP: 96/71 93/71  106/86  Pulse: (!) 114 (!) 108  (!) 111  Resp: (!) 24 20  20   Temp:   98.8 F (37.1 C)   TempSrc:   Oral   SpO2: 93% 96%  100%  Weight:      Height:        Intake/Output Summary (Last 24 hours) at 12/25/2018 1810 Last data filed at 12/25/2018 1735 Gross per 24 hour  Intake 985.64 ml  Output 350 ml  Net 635.64 ml   Filed Weights   12/26/2018 1144 12/24/18 0400 12/25/18 0500  Weight: 75.7 kg 77.3 kg 77.3 kg    Examination:  General exam: Alert, awake, oriented x 3 Respiratory system: Clear to auscultation. Respiratory effort normal. Cardiovascular system:RRR. No murmurs, rubs, gallops. Gastrointestinal system: Abdomen is nondistended, soft and nontender. No organomegaly or masses felt. Normal bowel sounds heard. Central nervous system: Alert and oriented. No focal neurological deficits. Extremities: 1+ edema bilaterally Skin: No rashes, lesions or ulcers Psychiatry: Judgement and insight appear normal. Mood & affect appropriate.      Data Reviewed: I have personally reviewed following  labs and imaging studies  CBC: Recent Labs  Lab 12/22/2018 0833 12/24/18 0411 12/24/18 1914 12/25/18 0548  WBC 24.8* 19.4* 19.2* 18.5*  HGB 9.9* 7.5* 7.6* 7.7*  HCT 31.0* 23.8* 23.2* 23.9*  MCV 88.3 88.5 86.2 85.1  PLT 192 158 154 458   Basic Metabolic Panel: Recent Labs  Lab 01/01/2019 0833 12/24/18 0411 12/24/18 1914 12/25/18 0548  NA 135 132* 130* 134*  K 3.2* 2.1* 3.2* 3.5  CL 94* 96* 96* 98  CO2 33* 29 27  29   GLUCOSE 62* 73 77 68*  BUN 17 15 11 10   CREATININE 0.54* 0.36* 0.36* 0.33*  CALCIUM 7.0* 6.7* 6.6* 7.2*  MG  --  1.2*  --  1.9   GFR: Estimated Creatinine Clearance: 74.9 mL/min (A) (by C-G formula based on SCr of 0.33 mg/dL (L)). Liver Function Tests: Recent Labs  Lab 01/05/2019 0833 12/24/18 0411 12/25/18 0548  AST 115* 66* 53*  ALT 149* 95* 82*  ALKPHOS 122 85 94  BILITOT 1.1 1.0 1.2  PROT 4.7* 4.2* 4.4*  ALBUMIN 1.6* 2.0* 2.3*   Recent Labs  Lab 12/22/2018 0833  LIPASE 15   No results for input(s): AMMONIA in the last 168 hours. Coagulation Profile: Recent Labs  Lab 01/13/2019 1514  INR 1.4*   Cardiac Enzymes: Recent Labs  Lab 01/14/2019 0833 12/25/18 0548  CKTOTAL 1,003* 389  TROPONINI <0.03  --    BNP (last 3 results) No results for input(s): PROBNP in the last 8760 hours. HbA1C: No results for input(s): HGBA1C in the last 72 hours. CBG: No results for input(s): GLUCAP in the last 168 hours. Lipid Profile: No results for input(s): CHOL, HDL, LDLCALC, TRIG, CHOLHDL, LDLDIRECT in the last 72 hours. Thyroid Function Tests: No results for input(s): TSH, T4TOTAL, FREET4, T3FREE, THYROIDAB in the last 72 hours. Anemia Panel: Recent Labs    12/24/18 1914  VITAMINB12 655  FOLATE 4.7*  FERRITIN 610*  TIBC NOT CALCULATED  IRON 5*  RETICCTPCT 2.1   Sepsis Labs: Recent Labs  Lab 01/09/2019 0833 12/30/2018 1514  PROCALCITON  --  4.11  LATICACIDVEN 1.4  --     Recent Results (from the past 240 hour(s))  Culture, blood (single)     Status: None (Preliminary result)   Collection Time: 01/05/2019  8:33 AM  Result Value Ref Range Status   Specimen Description BLOOD LEFT ARM  Final   Special Requests   Final    BOTTLES DRAWN AEROBIC AND ANAEROBIC Blood Culture adequate volume   Culture   Final    NO GROWTH 2 DAYS Performed at [facility], [address] [place], [place] [address]    Report Status PENDING  Incomplete  Culture, blood (single)     Status:  None (Preliminary result)   Collection Time: 12/19/2018  9:07 AM  Result Value Ref Range Status   Specimen Description RIGHT ANTECUBITAL  Final   Special Requests   Final    BOTTLES DRAWN AEROBIC AND ANAEROBIC Blood Culture adequate volume   Culture   Final    NO GROWTH 2 DAYS Performed at [facility], [address], [place], [place] [address]    Report Status PENDING  Incomplete  MRSA PCR Screening     Status: None   Collection Time: 01/07/2019 11:51 AM  Result Value Ref Range Status   MRSA by PCR NEGATIVE NEGATIVE Final    Comment:        The GeneXpert MRSA Assay (FDA approved for NASAL specimens only),  is one component of a comprehensive MRSA colonization surveillance program. It is not intended to diagnose MRSA infection nor to guide or monitor treatment for MRSA infections. Performed at [facility], [address], [place], [place] [address]          Radiology Studies: [place] [facility]  Result Date: 12/24/2018 If [facility] image not attached, placement could not be confirmed due to current cardiac rhythm.       Scheduled Meds:  aspirin  325 mg Oral Q breakfast   Chlorhexidine Gluconate Cloth  6 each Topical Q0600   Chlorhexidine Gluconate Cloth  6 each Topical Daily   guaiFENesin  1,200 mg Oral BID   heparin  5,000 Units Subcutaneous Q8H   levETIRAcetam  500 mg Oral BID   midodrine  5 mg Oral TID WC   pantoprazole  40 mg Oral Daily   sodium chloride flush  10-40 mL Intracatheter Q12H   tamsulosin  0.4 mg Oral Daily   Continuous Infusions:  sodium chloride Stopped (12/25/18 0326)   sodium chloride 100 mL/hr at 12/25/18 1720   ceFEPime (MAXIPIME) IV 2 g (12/25/18 1446)   phenylephrine (NEO-SYNEPHRINE) Adult infusion Stopped (12/25/18 0251)     LOS: 2 days    Time spent: 74mins    Kathie Dike, MD [facility]   If 7PM-7AM, please contact night-coverage [email or web address]  12/25/2018, 6:10 PM

## 2018-12-25 NOTE — Progress Notes (Signed)
Late entry  Patient's neo drip stopped 0250 as heart rate increased to 120-130 on drip. Systolic and MAP have remained stable following discontinuation of drip.

## 2018-12-25 NOTE — Progress Notes (Signed)
Physical Therapy Treatment Patient Details Name: Joshua Mckee MRN: [id number] DOB: Jan 10, 1941 Today's Date: 12/25/2018    History of Present Illness Joshua Mckee is a 78 y.o. male with medical history significant of previous CVA, seizure disorder, recent admission for fall and rhabdomyolysis, patient on 3/9 to skilled nursing facility.  His wife reports that he is returning home over the last 10 days.  The patient has been in a wheelchair since his discharge from the hospital.  She reports that since returning home, he is been complaining of left rib pain.  He is also had generalized weakness for the past several days.  His p.o. intake has been poor for quite some time now.  He has not had any fevers.  He has not had any cough.  No nausea or vomiting.  He is moving his bowels and passing gas.    PT Comments    Pt supine in bed and willing to participate with therapy today.  Pt required max A with bed mobility and mod to max A to sit on edge of bed due to posterior lean.  Seated LE strengthening exercises complete with cueing for full range with exercises and control. EOS pt left in bed with call bell within reach.    Follow Up Recommendations  SNF;Supervision/Assistance - 24 hour;Supervision for mobility/OOB     Equipment Recommendations  None recommended by PT    Recommendations for Other Services       Precautions / Restrictions Precautions Precautions: Fall Restrictions Weight Bearing Restrictions: No    Mobility  Bed Mobility Overal bed mobility: Needs Assistance Bed Mobility: Supine to Sit;Sit to Supine     Supine to sit: Max assist Sit to supine: Max assist   General bed mobility comments: labored movement, increased time,   Transfers                    Ambulation/Gait                 Stairs             Wheelchair Mobility    Modified Rankin (Stroke Patients Only)       Balance                                             Cognition Arousal/Alertness: Awake/alert Behavior During Therapy: WFL for tasks assessed/performed Overall Cognitive Status: Within Functional Limits for tasks assessed                                        Exercises General Exercises - Lower Extremity Ankle Circles/Pumps: Both;10 reps;Seated Long Arc Quad: Both;10 reps;Seated    General Comments        Pertinent Vitals/Pain Pain Assessment: No/denies pain    Home Living                      Prior Function            PT Goals (current goals can now be found in the care plan section)      Frequency    Min 3X/week      PT Plan      Co-evaluation              AM-PAC PT "6  Clicks" Mobility   Outcome Measure  Help needed turning from your back to your side while in a flat bed without using bedrails?: A Lot Help needed moving from lying on your back to sitting on the side of a flat bed without using bedrails?: A Lot Help needed moving to and from a bed to a chair (including a wheelchair)?: Total Help needed standing up from a chair using your arms (e.g., wheelchair or bedside chair)?: Total Help needed to walk in hospital room?: Total Help needed climbing 3-5 steps with a railing? : Total 6 Click Score: 8    End of Session   Activity Tolerance: Patient limited by fatigue;Patient tolerated treatment well Patient left: in bed;with call bell/phone within reach Nurse Communication: Mobility status PT Visit Diagnosis: Unsteadiness on feet (R26.81);Other abnormalities of gait and mobility (R26.89);Muscle weakness (generalized) (M62.81)     Time: 1030-1055 PT Time Calculation (min) (ACUTE ONLY): 25 min  Charges:  $Therapeutic Exercise: 8-22 mins $Therapeutic Activity: 8-22 mins                     [address], LPTA; CBIS [phone]  Aldona Lento 12/25/2018, 10:57 AM

## 2018-12-25 NOTE — TOC Progression Note (Signed)
Transition of Care [facility]) - Progression Note    Patient Details  Name: Joshua Mckee MRN: [id number] Date of Birth: 04/25/1941  Transition of Care [facility]) CM/SW Contact  Roda Shutters Margretta Sidle, RN Phone Number: 12/25/2018, 11:44 AM  Clinical Narrative:   Per MD, pt has asked we communicate with [name] only. CM contacted [name] and to notify her no bed offers were given. She plans to take pt home with resumption of HH services through [facility]. Pt has WC and RW at home. Irene's daughter will be at home to help care for pt after DC.     Expected Discharge Plan: [place] Barriers to Discharge: Continued Medical Work up  Expected Discharge Plan and Services Expected Discharge Plan: [place] arrangements for the past 2 months: [place]: Therapist, sports, PT [facility] Agency: [place] ([place])

## 2018-12-25 NOTE — Progress Notes (Signed)
No phone visit was performed because patient is currently in the hospital Joshua Pina, MD [place] 12/25/2018, 3:46 PM

## 2018-12-25 NOTE — TOC Transition Note (Signed)
Transition of Care [facility]) - CM/SW Discharge Note   Patient Details  Name: Joshua Mckee MRN: [id number] Date of Birth: [birth year]  Transition of Care [facility]) CM/SW Contact:  Sherald Barge, RN Phone Number: 12/25/2018, 2:56 PM   Clinical Narrative:  Daughter requests bed at [place]. Referral sent. No bed available today. Pt to be converted to GIP this afternoon.      Final next level of care: [place] Barriers to Discharge: Hospice Bed not available

## 2018-12-25 NOTE — Progress Notes (Signed)
Pharmacy Antibiotic Note  Joshua Mckee is a 78 y.o. male admitted on 01/11/2019 with pneumonia.  Pharmacy has been consulted for cefepime dosing.  Plan: cefepime 2gm iv q12h  Height: 5\' 9"  (175.3 cm) Weight: 170 lb 6.7 oz (77.3 kg) IBW/kg (Calculated) : 70.7  Temp (24hrs), Avg:97.8 F (36.6 C), Min:97.5 F (36.4 C), Max:98.2 F (36.8 C)  Recent Labs  Lab 01/10/2019 0833 12/24/18 0411 12/24/18 1914 12/25/18 0548  WBC 24.8* 19.4* 19.2* 18.5*  CREATININE 0.54* 0.36* 0.36* 0.33*  LATICACIDVEN 1.4  --   --   --     Estimated Creatinine Clearance: 74.9 mL/min (A) (by C-G formula based on SCr of 0.33 mg/dL (L)).    No Known Allergies  Antimicrobials this admission: 4/7 vancomycin >>4/8 4/7 cefepime >>   Microbiology results: 4/7 BCx: ng x 2 days  4/7 MRSA PCR: negative   Thank you for allowing pharmacy to be a part of this patient's care.  Joshua Mckee 12/25/2018 8:37 AM

## 2018-12-26 ENCOUNTER — Inpatient Hospital Stay (HOSPITAL_COMMUNITY): Payer: Medicare Other

## 2018-12-26 DIAGNOSIS — I959 Hypotension, unspecified: Secondary | ICD-10-CM

## 2018-12-26 DIAGNOSIS — T796XXD Traumatic ischemia of muscle, subsequent encounter: Secondary | ICD-10-CM

## 2018-12-26 DIAGNOSIS — R531 Weakness: Secondary | ICD-10-CM

## 2018-12-26 LAB — COMPREHENSIVE METABOLIC PANEL
ALT: 92 U/L — ABNORMAL HIGH (ref 0–44)
AST: 61 U/L — ABNORMAL HIGH (ref 15–41)
Albumin: 1.9 g/dL — ABNORMAL LOW (ref 3.5–5.0)
Alkaline Phosphatase: 103 U/L (ref 38–126)
Anion gap: 9 (ref 5–15)
BUN: 10 mg/dL (ref 8–23)
CO2: 26 mmol/L (ref 22–32)
Calcium: 7 mg/dL — ABNORMAL LOW (ref 8.9–10.3)
Chloride: 98 mmol/L (ref 98–111)
Creatinine, Ser: 0.36 mg/dL — ABNORMAL LOW (ref 0.61–1.24)
GFR calc Af Amer: >60 mL/min (ref >60)
GFR calc non Af Amer: >60 mL/min (ref >60)
Glucose, Bld: 69 mg/dL — ABNORMAL LOW (ref 70–99)
Potassium: 2.9 mmol/L — ABNORMAL LOW (ref 3.5–5.1)
Sodium: 133 mmol/L — ABNORMAL LOW (ref 135–145)
Total Bilirubin: 1 mg/dL (ref 0.3–1.2)
Total Protein: 4.5 g/dL — ABNORMAL LOW (ref 6.5–8.1)

## 2018-12-26 LAB — CBC
HCT: 24.3 % — ABNORMAL LOW (ref 39.0–52.0)
Hemoglobin: 8.1 g/dL — ABNORMAL LOW (ref 13.0–17.0)
MCH: 28.4 pg (ref 26.0–34.0)
MCHC: 33.3 g/dL (ref 30.0–36.0)
MCV: 85.3 fL (ref 80.0–100.0)
Platelets: 160 10*3/uL (ref 150–400)
RBC: 2.85 MIL/uL — ABNORMAL LOW (ref 4.22–5.81)
RDW: 16.1 % — ABNORMAL HIGH (ref 11.5–15.5)
WBC: 15.5 10*3/uL — ABNORMAL HIGH (ref 4.0–10.5)
nRBC: 0 % (ref 0.0–0.2)

## 2018-12-26 LAB — ECHOCARDIOGRAM LIMITED
Height: 69 in
Weight: 2726.65 oz

## 2018-12-26 LAB — CORTISOL-AM, BLOOD: Cortisol - AM: 18 ug/dL (ref 6.7–22.6)

## 2018-12-26 MED ORDER — DEXTROSE-NACL 5-0.9 % IV SOLN
INTRAVENOUS | Status: DC
  Administered 2018-12-26 – 2018-12-27 (×2): via INTRAVENOUS

## 2018-12-26 MED ORDER — IOHEXOL 300 MG/ML  SOLN
75.0000 mL | Freq: Once | INTRAMUSCULAR | Status: AC | PRN
  Administered 2018-12-26: 75 mL via INTRAVENOUS

## 2018-12-26 MED ORDER — MAGNESIUM SULFATE 2 GM/50ML IV SOLN
2.0000 g | Freq: Once | INTRAVENOUS | Status: AC
  Administered 2018-12-26: 2 g via INTRAVENOUS
  Filled 2018-12-26: qty 50

## 2018-12-26 MED ORDER — ENSURE ENLIVE PO LIQD
237.0000 mL | Freq: Two times a day (BID) | ORAL | Status: DC
  Administered 2018-12-26 – 2018-12-28 (×4): 237 mL via ORAL

## 2018-12-26 MED ORDER — POTASSIUM CHLORIDE 10 MEQ/100ML IV SOLN
10.0000 meq | INTRAVENOUS | Status: AC
  Administered 2018-12-26 (×5): 10 meq via INTRAVENOUS
  Filled 2018-12-26 (×5): qty 100

## 2018-12-26 MED ORDER — PRO-STAT SUGAR FREE PO LIQD
30.0000 mL | Freq: Two times a day (BID) | ORAL | Status: DC
  Administered 2018-12-26 – 2018-12-28 (×6): 30 mL via ORAL
  Filled 2018-12-26 (×7): qty 30

## 2018-12-26 NOTE — Progress Notes (Signed)
PROGRESS NOTE  Joshua Mckee  VZD:[id number]  DOB: 1940/09/29  DOA: 01/06/2019 PCP: Dettinger, Fransisca Kaufmann, MD  Brief Admission Hx: 78 year old gentleman with cerebrovascular disease and seizure disorder presented with left lower quadrant abdominal pain and chest pain and noted to have a left lower lobe pneumonia with pleural effusion.  MDM/Assessment & Plan:   1. Sepsis secondary to nosocomial pneumonia- present on admission.  WBC slowly trending down.  Blood cultures no growth to date.  Continue cefepime.  He is off vancomycin.  He does continue to have sinus tachycardia which we will continue to monitor closely. 2. Epilepsy-stable continue Keppra as ordered. 3. Rhabdomyolysis- CK levels have improved. 4. Hypoalbuminemia - requesting dietitian consult.  5. Healthcare associated pneumonia- he is being treated with cefepime and clinically is showing improvement. 6. Hypokalemia- replacing magnesium and potassium.  Follow levels. 7. Iron deficiency anemia-following hemoglobin. 8. Hypoglycemia - added dextrose to IVFs.    DVT prophylaxis: Heparin Code Status: DNR Family Communication: Phone update Disposition Plan: Home with home health services if no SNF bed is found  Consultants:    Procedures:    Antimicrobials:  Cefepime 12/18/2018 >  Vancomycin 4/7-4/8   Subjective: Says that he has poor appetite but otherwise no difficulty breathing.  No coughing.  He denies chest pain.  He denies shortness of breath.  Objective: Vitals:   12/26/18 0600 12/26/18 0741 12/26/18 0800 12/26/18 0830  BP: 112/88  (!) 127/93   Pulse: (!) 115  (!) 120 (!) 120  Resp: (!) 24  (!) 22 (!) 24  Temp:  98 F (36.7 C)    TempSrc:  Oral    SpO2: 96% 94% 95% 97%  Weight:      Height:        Intake/Output Summary (Last 24 hours) at 12/26/2018 0844 Last data filed at 12/26/2018 0402 Gross per 24 hour  Intake 1228.12 ml  Output -  Net 1228.12 ml   Filed Weights   12/29/2018 1144 12/24/18 0400  12/25/18 0500  Weight: 75.7 kg 77.3 kg 77.3 kg   REVIEW OF SYSTEMS  As per history otherwise all reviewed and reported negative  Exam:  General exam: Chronically ill-appearing emaciated male in no apparent distress.  Awake and alert cooperative. Respiratory system: Left lower lobe diminished breath sounds. No increased work of breathing. Cardiovascular system: S1 & S2 heard.  Sinus tachycardia.  No JVD, murmurs, gallops, clicks or pedal edema. Gastrointestinal system: Abdomen is nondistended, soft and nontender. Normal bowel sounds heard. Central nervous system: Alert and oriented. No focal neurological deficits. Extremities: no cyanosis or clubbing.  Data Reviewed: Basic Metabolic Panel: Recent Labs  Lab 12/30/2018 0833 12/24/18 0411 12/24/18 1914 12/25/18 0548 12/26/18 0539  NA 135 132* 130* 134* 133*  K 3.2* 2.1* 3.2* 3.5 2.9*  CL 94* 96* 96* 98 98  CO2 33* 29 27 29 26   GLUCOSE 62* 73 77 68* 69*  BUN 17 15 11 10 10   CREATININE 0.54* 0.36* 0.36* 0.33* 0.36*  CALCIUM 7.0* 6.7* 6.6* 7.2* 7.0*  MG  --  1.2*  --  1.9  --    Liver Function Tests: Recent Labs  Lab 12/18/2018 0833 12/24/18 0411 12/25/18 0548 12/26/18 0539  AST 115* 66* 53* 61*  ALT 149* 95* 82* 92*  ALKPHOS 122 85 94 103  BILITOT 1.1 1.0 1.2 1.0  PROT 4.7* 4.2* 4.4* 4.5*  ALBUMIN 1.6* 2.0* 2.3* 1.9*   Recent Labs  Lab 01/11/2019 0833  LIPASE 15   No results  for input(s): AMMONIA in the last 168 hours. CBC: Recent Labs  Lab 01/05/2019 0833 12/24/18 0411 12/24/18 1914 12/25/18 0548 12/26/18 0539  WBC 24.8* 19.4* 19.2* 18.5* 15.5*  HGB 9.9* 7.5* 7.6* 7.7* 8.1*  HCT 31.0* 23.8* 23.2* 23.9* 24.3*  MCV 88.3 88.5 86.2 85.1 85.3  PLT 192 158 154 160 160   Cardiac Enzymes: Recent Labs  Lab 12/29/2018 0833 12/25/18 0548  CKTOTAL 1,003* 389  TROPONINI <0.03  --    CBG (last 3)  No results for input(s): GLUCAP in the last 72 hours. Recent Results (from the past 240 hour(s))  Culture, blood  (single)     Status: None (Preliminary result)   Collection Time: 01/08/2019  8:33 AM  Result Value Ref Range Status   Specimen Description BLOOD LEFT ARM  Final   Special Requests   Final    BOTTLES DRAWN AEROBIC AND ANAEROBIC Blood Culture adequate volume   Culture   Final    NO GROWTH 2 DAYS Performed at [facility], [address] [place], [place] [address]    Report Status PENDING  Incomplete  Culture, blood (single)     Status: None (Preliminary result)   Collection Time: 12/28/2018  9:07 AM  Result Value Ref Range Status   Specimen Description RIGHT ANTECUBITAL  Final   Special Requests   Final    BOTTLES DRAWN AEROBIC AND ANAEROBIC Blood Culture adequate volume   Culture   Final    NO GROWTH 2 DAYS Performed at [facility], [address] [place], [place] [address]    Report Status PENDING  Incomplete  MRSA PCR Screening     Status: None   Collection Time: 12/17/2018 11:51 AM  Result Value Ref Range Status   MRSA by PCR NEGATIVE NEGATIVE Final    Comment:        The GeneXpert MRSA Assay (FDA approved for NASAL specimens only), is one component of a comprehensive MRSA colonization surveillance program. It is not intended to diagnose MRSA infection nor to guide or monitor treatment for MRSA infections. Performed at [facility], [address] [place], [place] [address]     Studies: [place] [facility]  Result Date: 12/24/2018 If [place] image not attached, placement could not be confirmed due to current cardiac rhythm.  Scheduled Meds: . aspirin  325 mg Oral Q breakfast  . Chlorhexidine Gluconate Cloth  6 each Topical Q0600  . Chlorhexidine Gluconate Cloth  6 each Topical Daily  . guaiFENesin  1,200 mg Oral BID  . heparin  5,000 Units Subcutaneous Q8H  . levETIRAcetam  500 mg Oral BID  . midodrine  5 mg Oral TID WC  . pantoprazole  40 mg Oral Daily  . sodium chloride flush  10-40 mL Intracatheter Q12H  . tamsulosin  0.4 mg Oral Daily   Continuous Infusions: .  sodium chloride 250 mL (12/26/18 0802)  . ceFEPime (MAXIPIME) IV 2 g (12/26/18 0402)  . dextrose 5 % and 0.9% NaCl 50 mL/hr at 12/26/18 0803  . magnesium sulfate 1 - 4 g bolus IVPB 2 g (12/26/18 0801)  . potassium chloride 10 mEq (12/26/18 0803)    Principal Problem:   Sepsis ([place]) Active Problems:   Reflux esophagitis   History of CVA (cerebrovascular accident)   Weakness   Rhabdomyolysis   PNA (pneumonia)   Seizure disorder (HCC)   Hypoalbuminemia   Anasarca   Pleural effusion on left   Hypotension  Critical Care Time spent: 32 minutes  Irwin Brakeman, MD [facility] 12/26/2018, 8:44 AM    LOS: 3 days  How to contact the [facility] Attending or Consulting provider [place] or covering provider during after hours [place], for this patient?  1. Check the care team in [facility] and look for a) attending/consulting TRH provider listed and b) the [facility] team listed 2. Log into [email or web address] and use [place]'s universal password to access. If you do not have the password, please contact the hospital operator. 3. Locate the [facility] provider you are looking for under [facility] and page to a number that you can be directly reached. 4. If you still have difficulty reaching the provider, please page the [facility] (Director on Call) for the Hospitalists listed on amion for assistance.

## 2018-12-26 NOTE — Progress Notes (Signed)
Physical Therapy Treatment Patient Details Name: Joshua Mckee MRN: [id number] DOB: 1941/04/18 Today's Date: 12/26/2018    History of Present Illness Joshua Mckee is a 78 y.o. male with medical history significant of previous CVA, seizure disorder, recent admission for fall and rhabdomyolysis, patient on 3/9 to skilled nursing facility.  His wife reports that he is returning home over the last 10 days.  The patient has been in a wheelchair since his discharge from the hospital.  She reports that since returning home, he is been complaining of left rib pain.  He is also had generalized weakness for the past several days.  His p.o. intake has been poor for quite some time now.  He has not had any fevers.  He has not had any cough.  No nausea or vomiting.  He is moving his bowels and passing gas.    PT Comments    Pt supine in bed and willing to participate with therapy today.  No reports of pain.  Pt required max A with bed mobility and max A with seated balance activities to prevent posterior lean.  Pt required frequent cueing for handplacment and trunk rotation to assist with supine to sit.  Seated exercises complete for LE strengthening with cueing for control and full range with all exercises.  EOS pt left in bed with call bell within reach.      Follow Up Recommendations  SNF;Supervision/Assistance - 24 hour;Supervision for mobility/OOB     Equipment Recommendations  None recommended by PT    Recommendations for Other Services       Precautions / Restrictions Precautions Precautions: Fall Restrictions Weight Bearing Restrictions: No    Mobility  Bed Mobility Overal bed mobility: Needs Assistance Bed Mobility: Supine to Sit;Sit to Supine     Supine to sit: Max assist Sit to supine: Max assist   General bed mobility comments: labored movement, increased time, Frequent cueing for hand placement to assist with bed mobility  Transfers Overall transfer level: Needs  assistance Equipment used: Rolling walker (2 wheeled) Transfers: Sit to/from Stand Sit to Stand: Max assist         General transfer comment: unable to lock knees due to weakness, able to slightly lift bottom off bed  Ambulation/Gait                 Stairs             Wheelchair Mobility    Modified Rankin (Stroke Patients Only)       Balance Overall balance assessment: Needs assistance Sitting-balance support: Feet supported;Bilateral upper extremity supported Sitting balance-Leahy Scale: Poor Sitting balance - Comments: fair/poor, posterior lean required A to prevent leaning backwards Postural control: Posterior lean                                  Cognition Arousal/Alertness: Awake/alert Behavior During Therapy: WFL for tasks assessed/performed Overall Cognitive Status: Within Functional Limits for tasks assessed                                        Exercises General Exercises - Lower Extremity Ankle Circles/Pumps: Both;10 reps;Seated Long Arc Quad: Both;10 reps;Seated Heel Slides: Both;10 reps;Supine    General Comments        Pertinent Vitals/Pain Pain Assessment: No/denies pain    Home Living  Prior Function            PT Goals (current goals can now be found in the care plan section)      Frequency    Min 3X/week      PT Plan Current plan remains appropriate    Co-evaluation              AM-PAC PT "6 Clicks" Mobility   Outcome Measure  Help needed turning from your back to your side while in a flat bed without using bedrails?: A Lot Help needed moving from lying on your back to sitting on the side of a flat bed without using bedrails?: A Lot Help needed moving to and from a bed to a chair (including a wheelchair)?: Total Help needed standing up from a chair using your arms (e.g., wheelchair or bedside chair)?: Total Help needed to walk in hospital  room?: Total Help needed climbing 3-5 steps with a railing? : Total 6 Click Score: 8    End of Session Equipment Utilized During Treatment: Gait belt Activity Tolerance: Patient limited by fatigue;Patient tolerated treatment well Patient left: in bed;with call bell/phone within reach Nurse Communication: Mobility status PT Visit Diagnosis: Unsteadiness on feet (R26.81);Other abnormalities of gait and mobility (R26.89);Muscle weakness (generalized) (M62.81)     Time: 2440-1027 PT Time Calculation (min) (ACUTE ONLY): 32 min  Charges:  $Therapeutic Exercise: 8-22 mins $Therapeutic Activity: 8-22 mins                    [address] LPTA; CBIS [phone]  Aldona Lento 12/26/2018, 11:33 AM

## 2018-12-26 NOTE — Progress Notes (Addendum)
Initial Nutrition Assessment  RD working remotely.    DOCUMENTATION CODES:     INTERVENTION:  Recommend consider liberalizing diet due to poor intake and add milk with all meals  High Protein snack qhs  Magic cup with dinner, provides 290 kcal and 9 grams of protein    NUTRITION DIAGNOSIS:   Increased nutrient needs related to acute illness(sepsis/pneumonia) as evidenced by estimated needs.   GOAL:  Patient will meet greater than or equal to 90% of their needs    MONITOR:   PO intake, Supplement acceptance, Labs  REASON FOR ASSESSMENT:   Consult Assessment of nutrition requirement/status  ASSESSMENT:  Patient is a 78 yo male who is septic on admission. Nosocomial pneumonia, low albumin, rhabdomyolysis, hypokalemia, hypoglycemia, hyponatremia. Afebrile. BP 115/87. History of cognitive impairment, HTN, HLD, prostate cancer, GERD, Stroke, Schatzki's ring.   Discussed patient nutrition intake with nursing. A poor appetite noted- meal intake 0- 25%. Will offer/encourage Ensure Enlive which is a high calorie /high protein source of nutrition. Talked with nutrition services ambassador who says pt ate a tomato sandwich for lunch but told her he didn't want anything for dinner. Baked chicken, mashed potatoes and green beans are the standard meal tonight but staff will offer patient alternate choice as option such as scoop of chicken or tuna salad, fruit and crackers. Will add milk  with all meals. Patient does not like tea. Trial of magic cup which would help increase overall caloric intake.  His weight has ranged from 74-77 kg for most of the past 7 months. There is a documented wt of 85 kg on 3/9 which appears to be an outlier when reviewing pt overall weight history.  Medications reviewed and include: protonix, maxipime  IVF-D5/NS @ 50 ml/hr  Labs: 4/10-Albumin-1.9 (L) down from 2.3 (L) yesterday. WBC- trending down to 15.5 (H), Hgb-8.1 (L). Iron-5 (L). B-12 655 (wnl), folate  4.7 (L), ferritin 610 (H). BMP Latest Ref Rng & Units 12/26/2018 12/25/2018 12/24/2018  Glucose 70 - 99 mg/dL 69(L) 68(L) 77  BUN 8 - 23 mg/dL 10 10 11   Creatinine 0.61 - 1.24 mg/dL 0.36(L) 0.33(L) 0.36(L)  BUN/Creat Ratio 10 - 24 - - -  Sodium 135 - 145 mmol/L 133(L) 134(L) 130(L)  Potassium 3.5 - 5.1 mmol/L 2.9(L) 3.5 3.2(L)  Chloride 98 - 111 mmol/L 98 98 96(L)  CO2 22 - 32 mmol/L 26 29 27   Calcium 8.9 - 10.3 mg/dL 7.0(L) 7.2(L) 6.6(L)     NUTRITION - FOCUSED PHYSICAL EXAM:  Unable to perform  Diet Order:   Diet Order            Diet Heart Room service appropriate? Yes; Fluid consistency: Thin  Diet effective now              EDUCATION NEEDS:   Not appropriate for education at this time Skin:  Skin Assessment: Reviewed RN Assessment(dry flaky skin bilateral heels)  Last BM:  4/7  Height:   Ht Readings from Last 1 Encounters:  01/12/2019 5\' 9"  (1.753 m)    Weight:   Wt Readings from Last 1 Encounters:  12/25/18 77.3 kg    Ideal Body Weight:  73 kg  BMI:  Body mass index is 25.17 kg/m.  Estimated Nutritional Needs:   Kcal:  7035-0093 (25-28 kcal/kg/bw)  Protein:  116-131 (1.5-1.7 gr/kg/bw)  Fluid:  1.9-2.2 (31ml/kg/bw)   Colman Cater MS,RD,CSG,LDN Office: [phone] Pager: [phone]

## 2018-12-26 NOTE — Care Management Important Message (Signed)
Important Message  Patient Details  Name: Joshua Mckee MRN: [id number] Date of Birth: [birth year]   Medicare Important Message Given:  Yes    [name] 12/26/2018, 3:05 PM

## 2018-12-26 NOTE — Progress Notes (Signed)
*  PRELIMINARY RESULTS* Echocardiogram Limited 2-D Echocardiogram has been performed.  Samuel Germany 12/26/2018, 12:15 PM

## 2018-12-27 LAB — COMPREHENSIVE METABOLIC PANEL
ALT: 96 U/L — ABNORMAL HIGH (ref 0–44)
AST: 61 U/L — ABNORMAL HIGH (ref 15–41)
Albumin: 1.8 g/dL — ABNORMAL LOW (ref 3.5–5.0)
Alkaline Phosphatase: 106 U/L (ref 38–126)
Anion gap: 6 (ref 5–15)
BUN: 11 mg/dL (ref 8–23)
CO2: 27 mmol/L (ref 22–32)
Calcium: 6.9 mg/dL — ABNORMAL LOW (ref 8.9–10.3)
Chloride: 99 mmol/L (ref 98–111)
Creatinine, Ser: <0.3 mg/dL — ABNORMAL LOW (ref 0.61–1.24)
Glucose, Bld: 104 mg/dL — ABNORMAL HIGH (ref 70–99)
Potassium: 3.1 mmol/L — ABNORMAL LOW (ref 3.5–5.1)
Sodium: 132 mmol/L — ABNORMAL LOW (ref 135–145)
Total Bilirubin: 0.9 mg/dL (ref 0.3–1.2)
Total Protein: 4.5 g/dL — ABNORMAL LOW (ref 6.5–8.1)

## 2018-12-27 LAB — CBC WITH DIFFERENTIAL/PLATELET
Abs Immature Granulocytes: 0.35 10*3/uL — ABNORMAL HIGH (ref 0.00–0.07)
Basophils Absolute: 0 10*3/uL (ref 0.0–0.1)
Basophils Relative: 0 %
Eosinophils Absolute: 0 10*3/uL (ref 0.0–0.5)
Eosinophils Relative: 0 %
HCT: 24 % — ABNORMAL LOW (ref 39.0–52.0)
Hemoglobin: 7.7 g/dL — ABNORMAL LOW (ref 13.0–17.0)
Immature Granulocytes: 2 %
Lymphocytes Relative: 8 %
Lymphs Abs: 1.2 10*3/uL (ref 0.7–4.0)
MCH: 27.8 pg (ref 26.0–34.0)
MCHC: 32.1 g/dL (ref 30.0–36.0)
MCV: 86.6 fL (ref 80.0–100.0)
Monocytes Absolute: 1.3 10*3/uL — ABNORMAL HIGH (ref 0.1–1.0)
Monocytes Relative: 8 %
Neutro Abs: 12.9 10*3/uL — ABNORMAL HIGH (ref 1.7–7.7)
Neutrophils Relative %: 82 %
Platelets: 148 10*3/uL — ABNORMAL LOW (ref 150–400)
RBC: 2.77 MIL/uL — ABNORMAL LOW (ref 4.22–5.81)
RDW: 16.1 % — ABNORMAL HIGH (ref 11.5–15.5)
WBC: 15.8 10*3/uL — ABNORMAL HIGH (ref 4.0–10.5)
nRBC: 0 % (ref 0.0–0.2)

## 2018-12-27 LAB — MAGNESIUM: Magnesium: 1.9 mg/dL (ref 1.7–2.4)

## 2018-12-27 MED ORDER — POTASSIUM CHLORIDE 10 MEQ/100ML IV SOLN
INTRAVENOUS | Status: AC
  Filled 2018-12-27: qty 100

## 2018-12-27 MED ORDER — POTASSIUM CHLORIDE 10 MEQ/100ML IV SOLN
10.0000 meq | INTRAVENOUS | Status: AC
  Administered 2018-12-27 (×5): 10 meq via INTRAVENOUS
  Filled 2018-12-27 (×5): qty 100

## 2018-12-27 MED ORDER — MAGNESIUM SULFATE 2 GM/50ML IV SOLN
2.0000 g | Freq: Once | INTRAVENOUS | Status: AC
  Administered 2018-12-27: 2 g via INTRAVENOUS
  Filled 2018-12-27: qty 50

## 2018-12-27 NOTE — Progress Notes (Addendum)
PROGRESS NOTE  Joshua Mckee  SJG:[id number]  DOB: 1940-12-29  DOA: 12/17/2018 PCP: Dettinger, Fransisca Kaufmann, MD   Brief Admission Hx: 78 year old gentleman with seizure disorder, cerebrovascular disease who presented with left lower quadrant abdominal pain, chest pain and noted to have a left lower lobe pneumonia with pleural effusion.  MDM/Assessment & Plan:   1. Sepsis secondary to nosocomial pneumonia-present on admission.  Blood cultures no growth to date.  He is clinically improving on IV cefepime which we will continue at this time.  He has been taken off vancomycin as his MRSA screen is negative. 2. Large loculated left pleural effusion - seen on CT chest.  Will ask radiology to look at on Monday for possible thoracentesis and if able to do will send fluid for testing.   3. Epilepsy-this is been chronic stable and well-controlled on Keppra. 4. Rhabdomyolysis-treated with IV fluid hydration and his CK levels have improved. 5. Hypoalbuminemia-appreciate the assistance of the dietitian we have ordered to liberalize his diet and hopefully his oral intake will improve. 6. Healthcare associated pneumonia-continue cefepime as he is clinically showing improvement. 7. Hypokalemia- still not repleted, continue IV replacement continue IV magnesium and recheck in a.m. 8. Iron deficiency anemia- hemoglobin is being monitored closely.  DVT prophylaxis: Heparin Code Status: DNR Family Communication: Updated by phone Disposition Plan: Home health if no SNF bed file   Consultants:    Procedures:    Antimicrobials:  Cefepime 12/25/2018 >  Vancomycin 4/7-4/8   Subjective: Patient is lying in the bed he says he feels tired this morning he is having no specific complaints of shortness of breath or chest pain.  He denies palpitations.  Objective: Vitals:   12/27/18 0500 12/27/18 0600 12/27/18 0726 12/27/18 0800  BP: (!) 110/93 (!) 124/96  (!) 137/93  Pulse: (!) 106 (!) 101 (!) 101   Resp:  (!) 28 (!) 30 (!) 27 (!) 24  Temp:   (!) 97.4 F (36.3 C)   TempSrc:   Axillary   SpO2: 96% 99% 95%   Weight: 83.1 kg     Height:        Intake/Output Summary (Last 24 hours) at 12/27/2018 0913 Last data filed at 12/27/2018 0400 Gross per 24 hour  Intake 1228.71 ml  Output 250 ml  Net 978.71 ml   Filed Weights   12/24/18 0400 12/25/18 0500 12/27/18 0500  Weight: 77.3 kg 77.3 kg 83.1 kg    REVIEW OF SYSTEMS  As per history otherwise all reviewed and reported negative  Exam:  General exam: Chronically ill-appearing emaciated appearing male lying recumbent in bed he is easily arousable and cooperative for exam. Respiratory system: Persistent diminished breath sounds in the left lower lobe and shallow breathing. No increased work of breathing. Cardiovascular system: S1 & S2 heard.  Tachycardic rate.  No JVD, murmurs, gallops, clicks or pedal edema. Gastrointestinal system: Abdomen is nondistended, soft and nontender. Normal bowel sounds heard. Central nervous system: Alert and oriented. No focal neurological deficits. Extremities: no cyanosis or clubbing and good pedal pulses palpated.  Data Reviewed: Basic Metabolic Panel: Recent Labs  Lab 12/24/18 0411 12/24/18 1914 12/25/18 0548 12/26/18 0539 12/27/18 0453  NA 132* 130* 134* 133* 132*  K 2.1* 3.2* 3.5 2.9* 3.1*  CL 96* 96* 98 98 99  CO2 29 27 29 26 27   GLUCOSE 73 77 68* 69* 104*  BUN 15 11 10 10 11   CREATININE 0.36* 0.36* 0.33* 0.36* <0.30*  CALCIUM 6.7* 6.6* 7.2* 7.0* 6.9*  MG 1.2*  --  1.9  --  1.9   Liver Function Tests: Recent Labs  Lab 01/01/2019 0833 12/24/18 0411 12/25/18 0548 12/26/18 0539 12/27/18 0453  AST 115* 66* 53* 61* 61*  ALT 149* 95* 82* 92* 96*  ALKPHOS 122 85 94 103 106  BILITOT 1.1 1.0 1.2 1.0 0.9  PROT 4.7* 4.2* 4.4* 4.5* 4.5*  ALBUMIN 1.6* 2.0* 2.3* 1.9* 1.8*   Recent Labs  Lab 01/12/2019 0833  LIPASE 15   No results for input(s): AMMONIA in the last 168 hours. CBC: Recent Labs   Lab 12/24/18 0411 12/24/18 1914 12/25/18 0548 12/26/18 0539 12/27/18 0453  WBC 19.4* 19.2* 18.5* 15.5* 15.8*  NEUTROABS  --   --   --   --  12.9*  HGB 7.5* 7.6* 7.7* 8.1* 7.7*  HCT 23.8* 23.2* 23.9* 24.3* 24.0*  MCV 88.5 86.2 85.1 85.3 86.6  PLT 158 154 160 160 148*   Cardiac Enzymes: Recent Labs  Lab 12/18/2018 0833 12/25/18 0548  CKTOTAL 1,003* 389  TROPONINI <0.03  --    CBG (last 3)  No results for input(s): GLUCAP in the last 72 hours. Recent Results (from the past 240 hour(s))  Culture, blood (single)     Status: None (Preliminary result)   Collection Time: 12/22/2018  8:33 AM  Result Value Ref Range Status   Specimen Description BLOOD LEFT ARM  Final   Special Requests   Final    BOTTLES DRAWN AEROBIC AND ANAEROBIC Blood Culture adequate volume   Culture   Final    NO GROWTH 4 DAYS Performed at [facility], [address], [place], [place] [address]    Report Status PENDING  Incomplete  Culture, blood (single)     Status: None (Preliminary result)   Collection Time: 01/02/2019  9:07 AM  Result Value Ref Range Status   Specimen Description RIGHT ANTECUBITAL  Final   Special Requests   Final    BOTTLES DRAWN AEROBIC AND ANAEROBIC Blood Culture adequate volume   Culture   Final    NO GROWTH 4 DAYS Performed at [facility], [address] [place], [place] [address]    Report Status PENDING  Incomplete  MRSA PCR Screening     Status: None   Collection Time: 01/05/2019 11:51 AM  Result Value Ref Range Status   MRSA by PCR NEGATIVE NEGATIVE Final    Comment:        The GeneXpert MRSA Assay (FDA approved for NASAL specimens only), is one component of a comprehensive MRSA colonization surveillance program. It is not intended to diagnose MRSA infection nor to guide or monitor treatment for MRSA infections. Performed at [facility], [address] [place], [place] [address]      Studies: Ct Chest W Contrast  Result Date: 12/26/2018 CLINICAL DATA:  Left-sided  chest pain. EXAM: CT CHEST WITH CONTRAST TECHNIQUE: Multidetector CT imaging of the chest was performed during intravenous contrast administration. CONTRAST:  68mL OMNIPAQUE IOHEXOL 300 MG/ML  SOLN COMPARISON:  Radiographs of December 23, 2018. FINDINGS: Cardiovascular: Atherosclerosis of thoracic aorta is noted without aneurysm or dissection. Normal cardiac size. Minimal pericardial effusion is noted. Mediastinum/Nodes: No enlarged mediastinal, hilar, or axillary lymph nodes. Thyroid gland, trachea, and esophagus demonstrate no significant findings. Lungs/Pleura: No pneumothorax is noted. Large probably loculated left pleural effusion is noted with significant atelectasis of both left upper and lower lobes. Interstitial densities are noted in the right upper and middle lobes concerning for possible pneumonia or atelectasis. Small  right pleural effusion is noted with adjacent subsegmental atelectasis. Upper Abdomen: Multiple hepatic cysts are noted. Musculoskeletal: No chest wall abnormality. No acute or significant osseous findings. IMPRESSION: Large probably loculated left pleural effusion is noted with associated atelectasis of both left upper and lower lobes. Interstitial opacities are noted in the right upper and middle lobes concerning for possible pneumonia or atelectasis. Small right pleural effusion is noted with adjacent subsegmental atelectasis of right lower lobe. Minimal pericardial effusion. Aortic Atherosclerosis (ICD10-I70.0). Electronically Signed   By: Marijo Conception, M.D.   On: 12/26/2018 15:41   Scheduled Meds: . aspirin  325 mg Oral Q breakfast  . Chlorhexidine Gluconate Cloth  6 each Topical Q0600  . Chlorhexidine Gluconate Cloth  6 each Topical Daily  . feeding supplement (ENSURE ENLIVE)  237 mL Oral BID BM  . feeding supplement (PRO-STAT SUGAR FREE 64)  30 mL Oral BID  . guaiFENesin  1,200 mg Oral BID  . heparin  5,000 Units Subcutaneous Q8H  . levETIRAcetam  500 mg Oral BID  .  midodrine  5 mg Oral TID WC  . pantoprazole  40 mg Oral Daily  . sodium chloride flush  10-40 mL Intracatheter Q12H  . tamsulosin  0.4 mg Oral Daily   Continuous Infusions: . sodium chloride Stopped (12/26/18 0803)  . ceFEPime (MAXIPIME) IV 2 g (12/27/18 0419)  . dextrose 5 % and 0.9% NaCl 35 mL/hr at 12/27/18 0803  . potassium chloride 10 mEq (12/27/18 0853)    Principal Problem:   Sepsis ([place]) Active Problems:   Reflux esophagitis   History of CVA (cerebrovascular accident)   Weakness   Rhabdomyolysis   PNA (pneumonia)   Seizure disorder (HCC)   Hypoalbuminemia   Anasarca   Pleural effusion on left   Hypotension  Critical care time spent: 32 minutes  Irwin Brakeman, MD [facility] 12/27/2018, 9:13 AM    LOS: 4 days  How to contact the [facility] Attending or Consulting provider [place] or covering provider during after hours [place], for this patient?  1. Check the care team in [facility] and look for a) attending/consulting [facility] provider listed and b) the [facility] team listed 2. Log into [email or web address] and use [place] [facility] [place]'s universal password to access. If you do not have the password, please contact the hospital operator. 3. Locate the [facility] provider you are looking for under [facility] and page to a number that you can be directly reached. 4. If you still have difficulty reaching the provider, please page the [facility] (Director on Call) for the Hospitalists listed on amion for assistance.

## 2018-12-28 DIAGNOSIS — K210 Gastro-esophageal reflux disease with esophagitis: Secondary | ICD-10-CM

## 2018-12-28 LAB — RENAL FUNCTION PANEL
Albumin: 1.8 g/dL — ABNORMAL LOW (ref 3.5–5.0)
Anion gap: 8 (ref 5–15)
BUN: 12 mg/dL (ref 8–23)
CO2: 27 mmol/L (ref 22–32)
Calcium: 7.2 mg/dL — ABNORMAL LOW (ref 8.9–10.3)
Chloride: 100 mmol/L (ref 98–111)
Creatinine, Ser: <0.3 mg/dL — ABNORMAL LOW (ref 0.61–1.24)
Glucose, Bld: 92 mg/dL (ref 70–99)
Phosphorus: 3.2 mg/dL (ref 2.5–4.6)
Potassium: 3.8 mmol/L (ref 3.5–5.1)
Sodium: 135 mmol/L (ref 135–145)

## 2018-12-28 LAB — CULTURE, BLOOD (SINGLE)
Culture: NO GROWTH
Culture: NO GROWTH
Special Requests: ADEQUATE
Special Requests: ADEQUATE

## 2018-12-28 LAB — MAGNESIUM: Magnesium: 2.3 mg/dL (ref 1.7–2.4)

## 2018-12-28 MED ORDER — ALBUMIN HUMAN 25 % IV SOLN
12.5000 g | Freq: Four times a day (QID) | INTRAVENOUS | Status: AC
  Administered 2018-12-28 (×2): 12.5 g via INTRAVENOUS
  Filled 2018-12-28: qty 100

## 2018-12-28 MED ORDER — ORAL CARE MOUTH RINSE
15.0000 mL | Freq: Two times a day (BID) | OROMUCOSAL | Status: DC
  Administered 2018-12-28 – 2018-12-30 (×5): 15 mL via OROMUCOSAL

## 2018-12-28 MED ORDER — POTASSIUM CHLORIDE CRYS ER 20 MEQ PO TBCR
40.0000 meq | EXTENDED_RELEASE_TABLET | Freq: Two times a day (BID) | ORAL | Status: DC
  Administered 2018-12-28: 20 meq via ORAL
  Filled 2018-12-28 (×2): qty 2

## 2018-12-28 MED ORDER — FUROSEMIDE 10 MG/ML IJ SOLN
60.0000 mg | Freq: Every day | INTRAMUSCULAR | Status: DC
  Administered 2018-12-28: 60 mg via INTRAVENOUS
  Filled 2018-12-28: qty 6

## 2018-12-28 NOTE — Progress Notes (Signed)
PROGRESS NOTE  Joshua Mckee  JHE:[id number]  DOB: 12-Jan-1941  DOA: 12/21/2018 PCP: Dettinger, Fransisca Kaufmann, MD  Brief Admission Hx: 78 year old gentleman with epilepsy, cerebrovascular disease who presented to the ED with left lower quadrant abdominal pain, chest pain and was noted to have a large left lower lobe pneumonia and pleural effusion.  MDM/Assessment & Plan:   1. Sepsis secondary to nosocomial pneumonia present on admission-resolved.  Blood cultures no growth to date.  He is being treated with IV cefepime.  Vancomycin discontinued as MRSA screen tested negative. 2. Large loculated left pleural effusion- noted on CT chest.  Planning to have radiology evaluate on Monday for possible thoracentesis and will send fluid for testing purposes. 3. Epilepsy-no recent seizure activity he has been well controlled on Keppra. 4. Rhabdomyolysis-he was recently treated with IV fluid hydration and his CK levels had improved.  He is now having some issues with volume overload which we are planning to treat. 5. Volume overload-likely secondary to hydration received in the treatment of rhabdomyolysis.  He also has hypoalbuminemia which is causing him to have anasarca. 6. Hypoalbuminemia- we will give some IV albumin today.  Appreciate dietitian consultation. 7. Hypokalemia- oral replacement ordered.  Continue to follow potassium and magnesium levels. 8. Iron deficiency anemia-following CBC.  DVT prophylaxis: Heparin Code Status: DNR Family Communication: Phone updates Disposition Plan: Home health if no SNF bed available   Consultants:    Procedures:    Antimicrobials:  Cefepime 01/08/2019-  Vancomycin 4/7-4/8  Subjective: Patient without any specific complaints this morning.  Objective: Vitals:   12/28/18 0549 12/28/18 0600 12/28/18 0700 12/28/18 0710  BP:  106/89 111/84   Pulse:  (!) 111 (!) 106 (!) 107  Resp:  (!) 23 (!) 29 (!) 32  Temp:    (!) 97.2 F (36.2 C)  TempSrc:     Axillary  SpO2:  93% 93% 93%  Weight: 84.4 kg     Height:        Intake/Output Summary (Last 24 hours) at 12/28/2018 0819 Last data filed at 12/28/2018 0550 Gross per 24 hour  Intake 2079.6 ml  Output 950 ml  Net 1129.6 ml   Filed Weights   12/25/18 0500 12/27/18 0500 12/28/18 0549  Weight: 77.3 kg 83.1 kg 84.4 kg     REVIEW OF SYSTEMS  As per history otherwise all reviewed and reported negative  Exam:  General exam: Elderly male lying in the bed he is awake and arousable and answers questions.  No apparent distress. Respiratory system: Diminished breath sounds in the left lower lobe. No increased work of breathing. Cardiovascular system: S1 & S2 heard.  Tachycardic rate.  Gastrointestinal system: Abdomen is nondistended, soft and nontender. Normal bowel sounds heard. Central nervous system: Alert and oriented. No focal neurological deficits. Extremities: 2+ pitting edema bilateral lower extremities.  Data Reviewed: Basic Metabolic Panel: Recent Labs  Lab 12/24/18 0411 12/24/18 1914 12/25/18 0548 12/26/18 0539 12/27/18 0453  NA 132* 130* 134* 133* 132*  K 2.1* 3.2* 3.5 2.9* 3.1*  CL 96* 96* 98 98 99  CO2 29 27 29 26 27   GLUCOSE 73 77 68* 69* 104*  BUN 15 11 10 10 11   CREATININE 0.36* 0.36* 0.33* 0.36* <0.30*  CALCIUM 6.7* 6.6* 7.2* 7.0* 6.9*  MG 1.2*  --  1.9  --  1.9   Liver Function Tests: Recent Labs  Lab 12/30/2018 0833 12/24/18 0411 12/25/18 0548 12/26/18 0539 12/27/18 0453  AST 115* 66* 53* 61* 61*  ALT  149* 95* 82* 92* 96*  ALKPHOS 122 85 94 103 106  BILITOT 1.1 1.0 1.2 1.0 0.9  PROT 4.7* 4.2* 4.4* 4.5* 4.5*  ALBUMIN 1.6* 2.0* 2.3* 1.9* 1.8*   Recent Labs  Lab 01/01/2019 0833  LIPASE 15   No results for input(s): AMMONIA in the last 168 hours. CBC: Recent Labs  Lab 12/24/18 0411 12/24/18 1914 12/25/18 0548 12/26/18 0539 12/27/18 0453  WBC 19.4* 19.2* 18.5* 15.5* 15.8*  NEUTROABS  --   --   --   --  12.9*  HGB 7.5* 7.6* 7.7* 8.1* 7.7*   HCT 23.8* 23.2* 23.9* 24.3* 24.0*  MCV 88.5 86.2 85.1 85.3 86.6  PLT 158 154 160 160 148*   Cardiac Enzymes: Recent Labs  Lab 01/02/2019 0833 12/25/18 0548  CKTOTAL 1,003* 389  TROPONINI <0.03  --    CBG (last 3)  No results for input(s): GLUCAP in the last 72 hours. Recent Results (from the past 240 hour(s))  Culture, blood (single)     Status: None   Collection Time: 12/22/2018  8:33 AM  Result Value Ref Range Status   Specimen Description BLOOD LEFT ARM  Final   Special Requests   Final    BOTTLES DRAWN AEROBIC AND ANAEROBIC Blood Culture adequate volume   Culture   Final    NO GROWTH 5 DAYS Performed at [facility], [address] [place], [place] [address]    Report Status 12/28/2018 FINAL  Final  Culture, blood (single)     Status: None   Collection Time: 12/24/2018  9:07 AM  Result Value Ref Range Status   Specimen Description RIGHT ANTECUBITAL  Final   Special Requests   Final    BOTTLES DRAWN AEROBIC AND ANAEROBIC Blood Culture adequate volume   Culture   Final    NO GROWTH 5 DAYS Performed at [facility], [address], [place], [place] [address]    Report Status 12/28/2018 FINAL  Final  MRSA PCR Screening     Status: None   Collection Time: 01/09/2019 11:51 AM  Result Value Ref Range Status   MRSA by PCR NEGATIVE NEGATIVE Final    Comment:        The GeneXpert MRSA Assay (FDA approved for NASAL specimens only), is one component of a comprehensive MRSA colonization surveillance program. It is not intended to diagnose MRSA infection nor to guide or monitor treatment for MRSA infections. Performed at [facility], [address] [place], [place] [address]      Studies: Ct Chest W Contrast  Result Date: 12/26/2018 CLINICAL DATA:  Left-sided chest pain. EXAM: CT CHEST WITH CONTRAST TECHNIQUE: Multidetector CT imaging of the chest was performed during intravenous contrast administration. CONTRAST:  41mL OMNIPAQUE IOHEXOL 300 MG/ML  SOLN COMPARISON:   Radiographs of December 23, 2018. FINDINGS: Cardiovascular: Atherosclerosis of thoracic aorta is noted without aneurysm or dissection. Normal cardiac size. Minimal pericardial effusion is noted. Mediastinum/Nodes: No enlarged mediastinal, hilar, or axillary lymph nodes. Thyroid gland, trachea, and esophagus demonstrate no significant findings. Lungs/Pleura: No pneumothorax is noted. Large probably loculated left pleural effusion is noted with significant atelectasis of both left upper and lower lobes. Interstitial densities are noted in the right upper and middle lobes concerning for possible pneumonia or atelectasis. Small right pleural effusion is noted with adjacent subsegmental atelectasis. Upper Abdomen: Multiple hepatic cysts are noted. Musculoskeletal: No chest wall abnormality. No acute or significant osseous findings. IMPRESSION: Large probably loculated left pleural effusion is noted with associated atelectasis of  both left upper and lower lobes. Interstitial opacities are noted in the right upper and middle lobes concerning for possible pneumonia or atelectasis. Small right pleural effusion is noted with adjacent subsegmental atelectasis of right lower lobe. Minimal pericardial effusion. Aortic Atherosclerosis (ICD10-I70.0). Electronically Signed   By: Marijo Conception, M.D.   On: 12/26/2018 15:41   Scheduled Meds: . aspirin  325 mg Oral Q breakfast  . Chlorhexidine Gluconate Cloth  6 each Topical Q0600  . Chlorhexidine Gluconate Cloth  6 each Topical Daily  . feeding supplement (ENSURE ENLIVE)  237 mL Oral BID BM  . feeding supplement (PRO-STAT SUGAR FREE 64)  30 mL Oral BID  . furosemide  60 mg Intravenous Daily  . guaiFENesin  1,200 mg Oral BID  . heparin  5,000 Units Subcutaneous Q8H  . levETIRAcetam  500 mg Oral BID  . mouth rinse  15 mL Mouth Rinse BID  . midodrine  5 mg Oral TID WC  . pantoprazole  40 mg Oral Daily  . potassium chloride  40 mEq Oral BID  . sodium chloride flush  10-40 mL  Intracatheter Q12H  . tamsulosin  0.4 mg Oral Daily   Continuous Infusions: . sodium chloride Stopped (12/26/18 0803)  . albumin human    . ceFEPime (MAXIPIME) IV Stopped (12/28/18 0308)   Principal Problem:   Sepsis ([place]) Active Problems:   Reflux esophagitis   History of CVA (cerebrovascular accident)   Weakness   Rhabdomyolysis   PNA (pneumonia)   Seizure disorder (HCC)   Hypoalbuminemia   Anasarca   Pleural effusion on left   Hypotension  Critical care time spent: 33 minutes  Irwin Brakeman, MD [facility] 12/28/2018, 8:19 AM    LOS: 5 days  How to contact the [facility] Attending or Consulting provider Ar[place] or covering provider during after hours [place], for this patient?  1. Check the care team in [facility] and look for a) attending/consulting TRH provider listed and b) the [facility] team listed 2. Log into [email or web address] and use 's universal password to access. If you do not have the password, please contact the hospital operator. 3. Locate the [facility] provider you are looking for under [facility] and page to a number that you can be directly reached. 4. If you still have difficulty reaching the provider, please page the [facility] (Director on Call) for the Hospitalists listed on amion for assistance.

## 2018-12-28 NOTE — Progress Notes (Signed)
Pharmacy Antibiotic Note  Joshua Mckee is a 78 y.o. male admitted on 01/14/2019 with pneumonia.  Pharmacy has been consulted for cefepime dosing. Patient continues to improve. D#6 of tx. Afebrile, WBC improving. To radiology on  Monday for possible thoracentesis of loculated effusions.   Plan: Continue cefepime 2gm iv q12h Monitor cxs, clinical progress, V/S, labs F/U LOT  Height: 5\' 9"  (175.3 cm) Weight: 186 lb 1.1 oz (84.4 kg) IBW/kg (Calculated) : 70.7  Temp (24hrs), Avg:97.5 F (36.4 C), Min:96.3 F (35.7 C), Max:98.4 F (36.9 C)  Recent Labs  Lab 01/04/2019 0833 12/24/18 0411 12/24/18 1914 12/25/18 0548 12/26/18 0539 12/27/18 0453 12/28/18 0751  WBC 24.8* 19.4* 19.2* 18.5* 15.5* 15.8*  --   CREATININE 0.54* 0.36* 0.36* 0.33* 0.36* <0.30* <0.30*  LATICACIDVEN 1.4  --   --   --   --   --   --     CrCl cannot be calculated (This lab value cannot be used to calculate CrCl because it is not a number: <0.30).    No Known Allergies  Antimicrobials this admission: 4/7 vancomycin >>4/8 4/7 cefepime >>   Microbiology results: 4/7 BCx: ng x 2 days  4/7 MRSA PCR: negative   Thank you for allowing pharmacy to be a part of this patient's care.  Cristy Friedlander 12/28/2018 8:56 AM

## 2018-12-29 ENCOUNTER — Inpatient Hospital Stay (HOSPITAL_COMMUNITY): Payer: Medicare Other

## 2018-12-29 LAB — CBC WITH DIFFERENTIAL/PLATELET
Abs Immature Granulocytes: 0.3 10*3/uL — ABNORMAL HIGH (ref 0.00–0.07)
Basophils Absolute: 0 10*3/uL (ref 0.0–0.1)
Basophils Relative: 0 %
Eosinophils Absolute: 0 10*3/uL (ref 0.0–0.5)
Eosinophils Relative: 0 %
HCT: 22.7 % — ABNORMAL LOW (ref 39.0–52.0)
Hemoglobin: 7.4 g/dL — ABNORMAL LOW (ref 13.0–17.0)
Immature Granulocytes: 2 %
Lymphocytes Relative: 7 %
Lymphs Abs: 1.1 10*3/uL (ref 0.7–4.0)
MCH: 27.9 pg (ref 26.0–34.0)
MCHC: 32.6 g/dL (ref 30.0–36.0)
MCV: 85.7 fL (ref 80.0–100.0)
Monocytes Absolute: 0.8 10*3/uL (ref 0.1–1.0)
Monocytes Relative: 5 %
Neutro Abs: 13 10*3/uL — ABNORMAL HIGH (ref 1.7–7.7)
Neutrophils Relative %: 86 %
Platelets: 169 10*3/uL (ref 150–400)
RBC: 2.65 MIL/uL — ABNORMAL LOW (ref 4.22–5.81)
RDW: 16.2 % — ABNORMAL HIGH (ref 11.5–15.5)
WBC: 15.2 10*3/uL — ABNORMAL HIGH (ref 4.0–10.5)
nRBC: 0 % (ref 0.0–0.2)

## 2018-12-29 LAB — RENAL FUNCTION PANEL
Albumin: 2 g/dL — ABNORMAL LOW (ref 3.5–5.0)
Anion gap: 8 (ref 5–15)
BUN: 13 mg/dL (ref 8–23)
CO2: 28 mmol/L (ref 22–32)
Calcium: 7.3 mg/dL — ABNORMAL LOW (ref 8.9–10.3)
Chloride: 100 mmol/L (ref 98–111)
Creatinine, Ser: 0.34 mg/dL — ABNORMAL LOW (ref 0.61–1.24)
GFR calc Af Amer: >60 mL/min (ref >60)
GFR calc non Af Amer: >60 mL/min (ref >60)
Glucose, Bld: 72 mg/dL (ref 70–99)
Phosphorus: 3.4 mg/dL (ref 2.5–4.6)
Potassium: 3.1 mmol/L — ABNORMAL LOW (ref 3.5–5.1)
Sodium: 136 mmol/L (ref 135–145)

## 2018-12-29 LAB — MAGNESIUM: Magnesium: 1.9 mg/dL (ref 1.7–2.4)

## 2018-12-29 MED ORDER — FUROSEMIDE 10 MG/ML IJ SOLN
80.0000 mg | Freq: Once | INTRAMUSCULAR | Status: AC
  Administered 2018-12-29: 80 mg via INTRAVENOUS
  Filled 2018-12-29: qty 8

## 2018-12-29 MED ORDER — HEPARIN SODIUM (PORCINE) 5000 UNIT/ML IJ SOLN
5000.0000 [IU] | Freq: Three times a day (TID) | INTRAMUSCULAR | Status: DC
  Administered 2018-12-29 – 2018-12-30 (×2): 5000 [IU] via SUBCUTANEOUS
  Filled 2018-12-29 (×2): qty 1

## 2018-12-29 MED ORDER — VITAMIN B-1 100 MG PO TABS: 100.0000 mg | ORAL_TABLET | Freq: Every day | ORAL | Status: DC

## 2018-12-29 MED ORDER — FUROSEMIDE 10 MG/ML IJ SOLN
40.0000 mg | Freq: Every day | INTRAMUSCULAR | Status: DC
  Administered 2018-12-29: 40 mg via INTRAVENOUS
  Filled 2018-12-29: qty 4

## 2018-12-29 MED ORDER — POTASSIUM CHLORIDE 10 MEQ/100ML IV SOLN: 10.0000 meq | INTRAVENOUS | Status: DC

## 2018-12-29 MED ORDER — POTASSIUM CHLORIDE 10 MEQ/100ML IV SOLN
10.0000 meq | INTRAVENOUS | Status: AC
  Administered 2018-12-29 (×5): 10 meq via INTRAVENOUS
  Filled 2018-12-29 (×5): qty 100

## 2018-12-29 NOTE — Progress Notes (Signed)
PROGRESS NOTE    Joshua Mckee  SEG:[id number]  DOB: 1941/05/08  DOA: 01/10/2019 PCP: Dettinger, Fransisca Kaufmann, MD   Brief Admission Hx: 78 year old gentleman with epilepsy, cerebrovascular disease status post CVA, recently presented with symptoms of chest pain and left-sided chest wall and rib pain.  He was noted to have pneumonia and a left pleural effusion.  He also has had generalized weakness.  MDM/Assessment & Plan:   1. Sepsis secondary to nosocomial pneumonia that was present on admission- this is resolved now.  Blood cultures have been negative.  His pneumonia is being treated with cefepime for total of 7 days.  MRSA was negative and vancomycin was discontinued. 2. Large loculated left pleural effusion-this was seen on CT of the chest.  Will ask radiology to evaluate for possible thoracentesis and if possible we will send fluid for testing, cytology, Gram stain and other studies.  3. Healthcare associated pneumonia-he is completing course of cefepime. 4. Epilepsy-no recent seizure activity.  He has been on Keppra. 5. Generalized weakness, worsening gait and inability to walk -I went back and reviewed his CT brain and there was some concern of his ventriculomegaly and the possibility of NPH.  We will ask for neurology opinion regarding this.  With the patient's debility he may not be a candidate for any type of procedures or interventions. 6. Rhabdomyolysis-this was treated during his recent admission. 7. Volume overload- he is being diuresed with IV Lasix. 8. Sinus tachycardia -likely secondary to large left pleural effusion, pneumonia. 9. Hypokalemia-IV replacement ordered.  Follow.  Magnesium within normal limits. 10. Hypoalbuminemia- slightly improved after giving IV albumin.  He is not eating well.  His prognosis remains very guarded at this time. 11. Dysphagia- nursing reported patient having difficulty swallowing.  Will obtain SLP evaluation.  This is another sign of very poor  prognosis for this patient.  Will discuss with family. 12. Iron deficiency anemia- hemoglobin holding stable will follow closely.  DVT prophylaxis: Heparin/TED hose Code Status: DNR Family Communication: Irene per patient's request Disposition Plan: Prognosis remains very guarded, if he has no meaningful improvement after 7 days of intensive treatment I feel that a palliative medicine consult is appropriate as patient likely is at end-of-life.  Consultants:    Procedures:    Antimicrobials:  Cefepime 4/7 >>  Vancomycin 4/7-4/8  Subjective: Patient says he does not feel good today.  He is not able to explain what he means by that.  Objective: Vitals:   12/29/18 0600 12/29/18 0700 12/29/18 0800 12/29/18 0802  BP: (!) 118/94 107/82 (!) 119/92   Pulse: (!) 117 (!) 103 (!) 113   Resp: (!) 23 (!) 32 (!) 33   Temp:    97.6 F (36.4 C)  TempSrc:    Oral  SpO2: 92% 94% 91%   Weight:      Height:        Intake/Output Summary (Last 24 hours) at 12/29/2018 0818 Last data filed at 12/29/2018 0407 Gross per 24 hour  Intake 612.47 ml  Output 2500 ml  Net -1887.53 ml   Filed Weights   12/27/18 0500 12/28/18 0549 12/29/18 0400  Weight: 83.1 kg 84.4 kg 81 kg   REVIEW OF SYSTEMS  As per history otherwise all reviewed and reported negative  Exam:  General exam: Frail elderly male he is lying in the bed he has a very raspy voice and mostly dysarthric. Respiratory system: Crackles with diminished breath sounds in the left lower lobe. No increased work of breathing.  Cardiovascular system: S1 & S2 heard.  Tachycardia.  Gastrointestinal system: Abdomen is nondistended, soft and generalized nonspecific tenderness. Normal bowel sounds heard. Central nervous system: Lethargic but arousable. Extremities: 2+ pitting edema bilateral lower extremities.  Data Reviewed: Basic Metabolic Panel: Recent Labs  Lab 12/24/18 0411  12/25/18 0548 12/26/18 0539 12/27/18 0453 12/28/18 0751  12/29/18 0433  NA 132*   < > 134* 133* 132* 135 136  K 2.1*   < > 3.5 2.9* 3.1* 3.8 3.1*  CL 96*   < > 98 98 99 100 100  CO2 29   < > 29 26 27 27 28   GLUCOSE 73   < > 68* 69* 104* 92 72  BUN 15   < > 10 10 11 12 13   CREATININE 0.36*   < > 0.33* 0.36* <0.30* <0.30* 0.34*  CALCIUM 6.7*   < > 7.2* 7.0* 6.9* 7.2* 7.3*  MG 1.2*  --  1.9  --  1.9 2.3 1.9  PHOS  --   --   --   --   --  3.2 3.4   < > = values in this interval not displayed.   Liver Function Tests: Recent Labs  Lab 01/14/2019 7858 12/24/18 0411 12/25/18 0548 12/26/18 0539 12/27/18 0453 12/28/18 0751 12/29/18 0433  AST 115* 66* 53* 61* 61*  --   --   ALT 149* 95* 82* 92* 96*  --   --   ALKPHOS 122 85 94 103 106  --   --   BILITOT 1.1 1.0 1.2 1.0 0.9  --   --   PROT 4.7* 4.2* 4.4* 4.5* 4.5*  --   --   ALBUMIN 1.6* 2.0* 2.3* 1.9* 1.8* 1.8* 2.0*   Recent Labs  Lab 12/27/2018 0833  LIPASE 15   No results for input(s): AMMONIA in the last 168 hours. CBC: Recent Labs  Lab 12/24/18 1914 12/25/18 0548 12/26/18 0539 12/27/18 0453 12/29/18 0433  WBC 19.2* 18.5* 15.5* 15.8* 15.2*  NEUTROABS  --   --   --  12.9* 13.0*  HGB 7.6* 7.7* 8.1* 7.7* 7.4*  HCT 23.2* 23.9* 24.3* 24.0* 22.7*  MCV 86.2 85.1 85.3 86.6 85.7  PLT 154 160 160 148* 169   Cardiac Enzymes: Recent Labs  Lab 12/25/2018 0833 12/25/18 0548  CKTOTAL 1,003* 389  TROPONINI <0.03  --    CBG (last 3)  No results for input(s): GLUCAP in the last 72 hours. Recent Results (from the past 240 hour(s))  Culture, blood (single)     Status: None   Collection Time: 01/14/2019  8:33 AM  Result Value Ref Range Status   Specimen Description BLOOD LEFT ARM  Final   Special Requests   Final    BOTTLES DRAWN AEROBIC AND ANAEROBIC Blood Culture adequate volume   Culture   Final    NO GROWTH 5 DAYS Performed at [facility], [address] [place], [place] [address]    Report Status 12/28/2018 FINAL  Final  Culture, blood (single)     Status: None   Collection  Time: 12/28/2018  9:07 AM  Result Value Ref Range Status   Specimen Description RIGHT ANTECUBITAL  Final   Special Requests   Final    BOTTLES DRAWN AEROBIC AND ANAEROBIC Blood Culture adequate volume   Culture   Final    NO GROWTH 5 DAYS Performed at [facility], [address] [place], [place] [address]    Report Status 12/28/2018 FINAL  Final  MRSA PCR  Screening     Status: None   Collection Time: 12/29/2018 11:51 AM  Result Value Ref Range Status   MRSA by PCR NEGATIVE NEGATIVE Final    Comment:        The GeneXpert MRSA Assay (FDA approved for NASAL specimens only), is one component of a comprehensive MRSA colonization surveillance program. It is not intended to diagnose MRSA infection nor to guide or monitor treatment for MRSA infections. Performed at [facility], [address] [place], [place] [address]      Studies: No results found.   Scheduled Meds: . aspirin  325 mg Oral Q breakfast  . Chlorhexidine Gluconate Cloth  6 each Topical Q0600  . Chlorhexidine Gluconate Cloth  6 each Topical Daily  . feeding supplement (ENSURE ENLIVE)  237 mL Oral BID BM  . feeding supplement (PRO-STAT SUGAR FREE 64)  30 mL Oral BID  . furosemide  40 mg Intravenous Daily  . levETIRAcetam  500 mg Oral BID  . mouth rinse  15 mL Mouth Rinse BID  . midodrine  5 mg Oral TID WC  . pantoprazole  40 mg Oral Daily  . sodium chloride flush  10-40 mL Intracatheter Q12H  . tamsulosin  0.4 mg Oral Daily   Continuous Infusions: . sodium chloride 250 mL (12/28/18 1732)  . ceFEPime (MAXIPIME) IV Stopped (12/29/18 0353)  . potassium chloride 10 mEq (12/29/18 0750)    Principal Problem:   Sepsis ([place]) Active Problems:   Reflux esophagitis   History of CVA (cerebrovascular accident)   Weakness   Rhabdomyolysis   PNA (pneumonia)   Seizure disorder (HCC)   Hypoalbuminemia   Anasarca   Pleural effusion on left   Hypotension  Critical CareTime spent: 32 minutes  Irwin Brakeman, MD  [facility] 12/29/2018, 8:18 AM    LOS: 6 days  How to contact the [facility] Attending or Consulting provider [place] or covering provider during after hours [place], for this patient?  1. Check the care team in [facility] and look for a) attending/consulting TRH provider listed and b) the [facility] team listed 2. Log into [email or web address] and use [place]'s universal password to access. If you do not have the password, please contact the hospital operator. 3. Locate the [facility] provider you are looking for under [facility] and page to a number that you can be directly reached. 4. If you still have difficulty reaching the provider, please page the [facility] (Director on Call) for the Hospitalists listed on amion for assistance.

## 2018-12-29 NOTE — Care Management Important Message (Signed)
Important Message  Patient Details  Name: Joshua Mckee MRN: [id number] Date of Birth: 1941-07-22   Medicare Important Message Given:  Yes    [name] 12/29/2018, 11:59 AM

## 2018-12-29 NOTE — Progress Notes (Signed)
Report given to accepting RN. Daughter, Mariann Laster, called and notified that patient has been transferred to room 331. All questions addressed and answered.   Celestia Khat, RN

## 2018-12-29 NOTE — Consult Note (Addendum)
[place] A. Merlene Laughter, MD     [email or web address]          Joshua Mckee is an 78 y.o. male.   ASSESSMENT/PLAN: 1.  Multifactorial gait impairment including acute medical illnesses, pneumonia and hypokalemia.  The patient's imaging is suggestive of normal pressure hydrocephalus but this condition is often very difficult to diagnose clinically and even more difficult convince a neurosurgeon to treat especially given potential complications of subdural hematoma with treatment and hard to predict which patient will respond.  Given the patient's overall multiple comorbidities and age, I would not recommend further evaluation for normal pressure hydrocephalus at this time.  We can readdress this once he has returned to baseline in the outpatient setting. PT/OT.  Empiric treatment with thiamine.  2.  Altered mental status due to the above  3.  Epilepsy controlled for the most part  4.  Elevated CPK thought to be due to rhabdomyolysis.  This is trending down which adds credence to this and less likely reflect myositis.    Patient is a 78 year old black male who presented to the hospital a few days ago for evaluation of chest pain.  Patient's work-up has been significant for lobulated pneumonia.  He has been treated for this.  He was admitted to the hospital a month ago for global weakness, rhabdomyolysis and falling.  He did undergo physical therapy after acute hospitalization and was apparently discharged home before presented to the hospital again.  He apparently had a recent generalized seizure.  Work-up has been significant for significant elevated CPK in about 11-3998 range although this is coming down with this recent hospitalization.  Potassium a few days ago was 2.1 at the low although he had several in the low 3 range and also 2.9.  CPK has been trending down at this hospitalization to the about 1000 range.  As part of the patient's work-up for gait impairment and falling,  head CT scan was done showing evidence of possible hydrocephalus.  Review of systems is not obtainable given the patient's confusion at this time.    GENERAL: The patient seems to be in discomfort but in no acute distress.  Seem to be in some discomfort.  He is moaning at times during the evaluation.  HEENT: Neck is supple no trauma appreciated.  There appears to be some evidence of bilateral ptosis.  ABDOMEN: soft  EXTREMITIES: There is mild pitting edema of the upper extremities.  There is more prominent pitting edema of the lower extremities.  There is marked arthritic changes noted diffusely.  BACK: Unremarkable  SKIN: Normal by inspection.    MENTAL STATUS: Patient lays in bed with eyes closed.  He opens his eyes to verbal stimuli.  He does follow midline commands.  He moans and groans through their evaluation but speech is mostly incomprehensible.  CRANIAL NERVES: Pupils are equal, round and reactive to light; extra ocular movements are full by passive oculocephalic reflexes, there is no significant nystagmus; visual fields are full by direct threat; upper and lower facial muscles are normal in strength and symmetric, there is no flattening of the nasolabial folds; tongue is midline.  MOTOR: There is diffuse global weakness graded as 2/5 with somewhat reduced tone throughout.  Bulk is normal.  COORDINATION: No tremor is noted.  There is no dysmetria or parkinsonism.  REFLEXES: Deep tendon reflexes are symmetrical and normal.    SENSATION: He grimaces and withdraws slightly to deep painful stimuli.  NEURO NOTE 12- 2019 1.  Acute left leg monoparesis with imaging showing a right frontal infarct.  This is a cortical embolic appearing infarct.  Consequently, I am suspicious for the patient having possible paroxysmal atrial fibrillation or other embolic causes for the stroke.  We will follow-up the echocardiography.  This is unrevealing, a 30-day event monitor is  recommended.  I would suggest continuation of his increased dose of aspirin at 325.  Continue with statin medication.  2.  Apparent epilepsy recently diagnosed a year ago and has responded well to [name].  3.  Cognitive impairment:      The patient is 78 year old right-handed black male who presents with a 2-day history of acute left leg weakness.  The patient typically ambulates with a cane but he was not able to walk around without having to be helped significantly.  The patient decided to seek medical attention.  He denies dysarthria, dysphasia or involvement of the left upper extremity.  There is no loss of consciousness, headaches, and chest pain or shortness of breath.  He seemed to have some difficulty with comprehending either due to impaired hearing or her mild cognitive impairment.  Patient tells me that he does have a history of seizures was started last year.  He tells me he was placed on seizure medication and has done well with this.  He reports the medication appears to be working and he denies significant side effects.  The review of systems otherwise negative.      Blood pressure 114/80, pulse (!) 115, temperature 97.6 F (36.4 C), temperature source Oral, resp. rate 16, height 5\' 9"  (1.753 m), weight 81 kg, SpO2 90 %.  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 lap. w/ lysis adhesions for partial sbo   Hyperlipidemia    Hyperlipidemia    Hyperplasia of prostate with lower urinary tract symptoms (LUTS)    Hypertension    LAFB (left anterior fascicular block)    Mild cognitive impairment with memory loss    PROBABLE STROKE RESIDUAL BUT POSSIBLE PRIOR TO STROKE   Partial seizure ([place])    x1  10-15-2016 at ED visit , post cva 08-27-2016   Poor historian    Prostate cancer [facility]) urologist-  dr Jeffie Pollock   Schatzki's ring    Stroke [facility])    Substance abuse [facility])     Past Surgical History:  Procedure Laterality Date   BOWEL RESECTION N/A 09/16/2018   Procedure: PARTIAL SMALL BOWEL RESECTION;  Surgeon: Aviva Signs, MD;  Location: [facility];  Service: General;  Laterality: N/A;   COLONOSCOPY N/A 03/08/2016   Procedure: COLONOSCOPY;  Surgeon: Daneil Dolin, MD;  Location[facility];  Service: Endoscopy;  Laterality: N/A;  1300-moved  to 1400   ESOPHAGOGASTRODUODENOSCOPY N/A 10/05/2015   Dr. Gala Romney: erosive reflux esophagitis, non-critcal Schatki's ring non-manipulated   ESOPHAGOGASTRODUODENOSCOPY N/A 12/15/2015   Dr. Rourk:non bleeding esophageal ulcer/small HH   ESOPHAGOGASTRODUODENOSCOPY N/A 03/08/2016   Procedure: ESOPHAGOGASTRODUODENOSCOPY (EGD);  Surgeon: Daneil Dolin, MD;  Location: [facility];  Service: Endoscopy;  Laterality: N/A;   EXPLORATORY LAPAROTOMY W/ ADHESIOLYSIS  04/06/2005   partial SBO   LAPAROTOMY N/A 09/16/2018   Procedure: EXPLORATORY LAPAROTOMY;  Surgeon: Aviva Signs, MD;  Location: [facility];  Service: General;  Laterality: N/A;   LYSIS OF ADHESION N/A 09/16/2018   Procedure: LYSIS OF ADHESION;  Surgeon: Aviva Signs, MD;  Location: [facility];  Service: General;  Laterality: N/A;   ORCHIECTOMY Bilateral 03/05/2017   Procedure: ORCHIECTOMY BILATERAL;  Surgeon: Irine Seal, MD;  Location: [facility];  Service: Urology;  Laterality:  Bilateral;   PROSTATE BIOPSY N/A 03/05/2017   Procedure: BIOPSY TRANSRECTAL ULTRASONIC PROSTATE (TUBP);  Surgeon: Irine Seal, MD;  Location: [facility];  Service: Urology;  Laterality: N/A;   TRANSTHORACIC ECHOCARDIOGRAM  08/28/2016   mild to moderate LVH, ef 55-60%/  mild AR and TR/  trivial MR    Family History  Problem Relation Age of Onset   Colon cancer Neg Hx     Social History:  reports that he has never smoked. He has never used smokeless tobacco. He reports that he does not drink alcohol or use drugs.  Allergies: No Known Allergies  Medications: Prior to Admission medications   Medication Sig Start Date End Date Taking? Authorizing Provider  aspirin 325 MG tablet Take 1 tablet (325 mg total) by mouth daily with breakfast. 11/24/18  Yes Emokpae, Courage, MD  atorvastatin (LIPITOR) 80 MG tablet Take 80 mg by mouth daily at 6 PM.  12/22/18  Yes [provider]  hydrochlorothiazide (HYDRODIURIL) 25 MG tablet Take 25 mg by mouth daily.  12/22/18  Yes [provider]  levETIRAcetam (KEPPRA) 500 MG tablet TAKE ONE TABLET BY MOUTH 2 TIMES A DAY. 06/02/18  Yes Dettinger, Fransisca Kaufmann, MD  midodrine (PROAMATINE) 2.5 MG tablet Take 2.5 mg by mouth 2 (two) times daily with a meal.  12/19/18  Yes [provider]  ondansetron (ZOFRAN-ODT) 4 MG disintegrating tablet Take 1 tablet (4 mg total) by mouth every 6 (six) hours as needed for nausea. 11/24/18  Yes Emokpae, Courage, MD  pantoprazole (PROTONIX) 40 MG tablet TAKE (1) TABLET BY MOUTH ONCE DAILY. 06/02/18  Yes Dettinger, Fransisca Kaufmann, MD  tamsulosin (FLOMAX) 0.4 MG CAPS capsule Take 1 capsule (0.4 mg total) by mouth daily. 06/02/18  Yes Dettinger, Fransisca Kaufmann, MD    Scheduled Meds:  aspirin  325 mg Oral Q breakfast   Chlorhexidine Gluconate Cloth  6 each Topical Q0600   Chlorhexidine Gluconate Cloth  6 each Topical Daily   feeding supplement (ENSURE ENLIVE)  237 mL Oral BID BM   feeding supplement (PRO-STAT  SUGAR FREE 64)  30 mL Oral BID   furosemide  40 mg Intravenous Daily   heparin  5,000 Units Subcutaneous Q8H   levETIRAcetam  500 mg Oral BID   mouth rinse  15 mL Mouth Rinse BID   midodrine  5 mg Oral TID WC   pantoprazole  40 mg Oral Daily   sodium chloride flush  10-40 mL Intracatheter Q12H   tamsulosin  0.4 mg Oral Daily   Continuous Infusions:  sodium chloride 250 mL (12/28/18 1732)   ceFEPime (MAXIPIME) IV 2  g (12/29/18 1525)   PRN Meds:.sodium chloride, acetaminophen **OR** acetaminophen, LORazepam, ondansetron **OR** ondansetron (ZOFRAN) IV, sodium chloride flush     Results for orders placed or performed during the hospital encounter of 12/30/2018 (from the past 48 hour(s))  Renal function panel     Status: Abnormal   Collection Time: 12/28/18  7:51 AM  Result Value Ref Range   Sodium 135 135 - 145 mmol/L   Potassium 3.8 3.5 - 5.1 mmol/L    Comment: DELTA CHECK NOTED NO VISIBLE HEMOLYSIS    Chloride 100 98 - 111 mmol/L   CO2 27 22 - 32 mmol/L   Glucose, Bld 92 70 - 99 mg/dL   BUN 12 8 - 23 mg/dL   Creatinine, Ser <0.30 (L) 0.61 - 1.24 mg/dL   Calcium 7.2 (L) 8.9 - 10.3 mg/dL   Phosphorus 3.2 2.5 - 4.6 mg/dL   Albumin 1.8 (L) 3.5 - 5.0 g/dL   GFR calc non Af Amer NOT CALCULATED >60 mL/min   GFR calc Af Amer NOT CALCULATED >60 mL/min   Anion gap 8 5 - 15    Comment: Performed at [facility], [address], [place], [place] [address]  Magnesium     Status: None   Collection Time: 12/28/18  7:51 AM  Result Value Ref Range   Magnesium 2.3 1.7 - 2.4 mg/dL    Comment: Performed at [facility], [address] [place], [place] [address]  CBC with Differential/Platelet     Status: Abnormal   Collection Time: 12/29/18  4:33 AM  Result Value Ref Range   WBC 15.2 (H) 4.0 - 10.5 K/uL   RBC 2.65 (L) 4.22 - 5.81 MIL/uL   Hemoglobin 7.4 (L) 13.0 - 17.0 g/dL   HCT 22.7 (L) 39.0 - 52.0 %   MCV 85.7 80.0 - 100.0 fL   MCH 27.9 26.0 - 34.0 pg   MCHC 32.6 30.0 - 36.0  g/dL   RDW 16.2 (H) 11.5 - 15.5 %   Platelets 169 150 - 400 K/uL   nRBC 0.0 0.0 - 0.2 %   Neutrophils Relative % 86 %   Neutro Abs 13.0 (H) 1.7 - 7.7 K/uL   Lymphocytes Relative 7 %   Lymphs Abs 1.1 0.7 - 4.0 K/uL   Monocytes Relative 5 %   Monocytes Absolute 0.8 0.1 - 1.0 K/uL   Eosinophils Relative 0 %   Eosinophils Absolute 0.0 0.0 - 0.5 K/uL   Basophils Relative 0 %   Basophils Absolute 0.0 0.0 - 0.1 K/uL   Immature Granulocytes 2 %   Abs Immature Granulocytes 0.30 (H) 0.00 - 0.07 K/uL    Comment: Performed at [facility], [address] [place], [place] [address]  Magnesium     Status: None   Collection Time: 12/29/18  4:33 AM  Result Value Ref Range   Magnesium 1.9 1.7 - 2.4 mg/dL    Comment: Performed at [facility], [address] [place], [place] [address]  Renal function panel     Status: Abnormal   Collection Time: 12/29/18  4:33 AM  Result Value Ref Range   Sodium 136 135 - 145 mmol/L   Potassium 3.1 (L) 3.5 - 5.1 mmol/L    Comment: DELTA CHECK NOTED   Chloride 100 98 - 111 mmol/L   CO2 28 22 - 32 mmol/L   Glucose, Bld 72 70 - 99 mg/dL   BUN 13 8 - 23 mg/dL   Creatinine, Ser 0.34 (L) 0.61 - 1.24 mg/dL  Calcium 7.3 (L) 8.9 - 10.3 mg/dL   Phosphorus 3.4 2.5 - 4.6 mg/dL   Albumin 2.0 (L) 3.5 - 5.0 g/dL   GFR calc non Af Amer >60 >60 mL/min   GFR calc Af Amer >60 >60 mL/min   Anion gap 8 5 - 15    Comment: Performed at [facility], [address], [place], [place] [address]    Studies/Results: The head CT scan is reviewed in person.  It is compared to the previous scan done a few months ago.  Both scan shows evidence of ventriculomegaly but the more recent scan definitely shows progression which seems out of proportion to the degree of atrophy.  There is evidence of Mickey Mouse appearance of the frontal horns and third ventricle.  No significant evidence of acute changes.    EEG 08-2017 IMPRESSION: 1. This recording is abnormal showing multiple episodes of  epileptiform activity involving the left temporal region. However, there is no evidence of ongoing electrographic seizures.  TSH 1.17; VIT B12 655     Joshua Mckee A. Merlene Laughter, M.D.  Diplomate, Tax adviser of Psychiatry and Neurology ( Neurology). 12/29/2018, 4:15 PM

## 2018-12-29 NOTE — Progress Notes (Signed)
PT Cancellation Note  Patient Details Name: Benaiah Behan MRN: [id number] DOB: 01-09-1941   Cancelled Treatment:    Reason Eval/Treat Not Completed: Patient at procedure or test/unavailable  Hold therapy due to procedure per RN.   [address] LPTA; [place]  Aldona Lento 12/29/2018, 9:35 AM

## 2018-12-29 NOTE — TOC Progression Note (Addendum)
Transition of Care [facility]) - Progression Note    Patient Details  Name: Messiah Ahr MRN: [id number] Date of Birth: 1940/10/26  Transition of Care [facility]) CM/SW Contact  Shade Flood, LCSW Phone Number: 12/29/2018, 10:22 AM  Clinical Narrative:    Reviewed pt's record. MD notes indicate pt's prognosis is guarded. MD considering possible Palliative Consult. Will follow and assist as needed.  1:17  Received contact from MD stating that pt's sig other, [name], asking him if she can access pt's belongings in [place]. MD asking for LCSW assistance. Spoke with RN Anderson Malta in the ICU who states that pt did give pt some money from his wallet several days ago but that he is not oriented now to give consent. [name] has been informed of this multiple times though she continues to ask. Treatment team sticky note left on pt's record. Will follow.   Expected Discharge Plan: [place] Barriers to Discharge: Hospice Bed not available  Expected Discharge Plan and Services Expected Discharge Plan: [place] arrangements for the past 2 months: [place]                     HH Arranged: RN, PT [facility] Agency: [place] ([facility])   Social Determinants of Health (SDOH) Interventions    Readmission Risk Interventions No flowsheet data found.

## 2018-12-29 NOTE — Evaluation (Signed)
Clinical/Bedside Swallow Evaluation Patient Details  Name: Joshua Mckee MRN: [id number] Date of Birth: Apr 06, 1941  Today's Date: 12/29/2018 Time: SLP Start Time (ACUTE ONLY): 1200 SLP Stop Time (ACUTE ONLY): 1218 SLP Time Calculation (min) (ACUTE ONLY): 18 min  Past Medical History:  Past Medical History:  Diagnosis Date  . AKI (acute kidney injury) ([place]) 09/15/2018  . Aneurysm of right common iliac artery (HCC)    2.2 cm per CT 02-18-2017  . Arthritis   . GERD (gastroesophageal reflux disease)   . Hiatal hernia   . Hiccups    CHRONIC--- MULTIPLE ED VISITS  . History of alcohol abuse   . History of GI bleed    upper GI bleed from esophageal ulcer 01/ 2017 and 03/ 2017 from erosive esophageal gastiris  . 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 History of small bowel obstruction    04-06-2005  s/p  exp. lap. w/ lysis adhesions for partial sbo  . Hyperlipidemia   . Hyperlipidemia   . Hyperplasia of prostate with lower urinary tract symptoms (LUTS)   . Hypertension   . LAFB (left anterior fascicular block)   . Mild cognitive impairment with memory loss    PROBABLE STROKE RESIDUAL BUT POSSIBLE PRIOR TO STROKE  . Partial seizure ([place])    x1  10-15-2016 at ED visit , post cva 08-27-2016  . Poor historian   . Prostate cancer [facility] urologist-  dr Jeffie Pollock  . Schatzki's ring   . Stroke ([place])   . Substance abuse [facility])    Past Surgical History:  Past Surgical History:  Procedure Laterality Date  . BOWEL RESECTION N/A 09/16/2018   Procedure: PARTIAL SMALL BOWEL RESECTION;  Surgeon: Aviva Signs, MD;  Location: [facility];  Service: General;  Laterality: N/A;  . COLONOSCOPY N/A 03/08/2016   Procedure: COLONOSCOPY;  Surgeon: Daneil Dolin, MD;  [facility];  Service: Endoscopy;  Laterality: N/A;   1300-moved to 1400  . ESOPHAGOGASTRODUODENOSCOPY N/A 10/05/2015   Dr. Gala Romney: erosive reflux esophagitis, non-critcal Schatki's ring non-manipulated  . ESOPHAGOGASTRODUODENOSCOPY N/A 12/15/2015   Dr. Rourk:non bleeding esophageal ulcer/small HH  . ESOPHAGOGASTRODUODENOSCOPY N/A 03/08/2016   Procedure: ESOPHAGOGASTRODUODENOSCOPY (EGD);  Surgeon: Daneil Dolin, MD;  Location: [facility];  Service: Endoscopy;  Laterality: N/A;  . EXPLORATORY LAPAROTOMY W/ ADHESIOLYSIS  04/06/2005   partial SBO  . LAPAROTOMY N/A 09/16/2018   Procedure: EXPLORATORY LAPAROTOMY;  Surgeon: Aviva Signs, MD;  Location: [facility];  Service: General;  Laterality: N/A;  . LYSIS OF ADHESION N/A 09/16/2018   Procedure: LYSIS OF ADHESION;  Surgeon: Aviva Signs, MD;  Location: [facility];  Service: General;  Laterality: N/A;  . ORCHIECTOMY Bilateral 03/05/2017   Procedure: ORCHIECTOMY BILATERAL;  Surgeon: Irine Seal, MD;  [facility];  Service: Urology;  Laterality: Bilateral;  . PROSTATE BIOPSY N/A 03/05/2017   Procedure: BIOPSY TRANSRECTAL ULTRASONIC PROSTATE (TUBP);  Surgeon: Irine Seal, MD;  Location: [facility];  Service: Urology;  Laterality: N/A;  . TRANSTHORACIC ECHOCARDIOGRAM  08/28/2016   mild to moderate LVH, ef 55-60%/  mild AR and TR/  trivial MR   HPI:  78 year old gentleman with epilepsy, cerebrovascular disease status post CVA, recently presented with symptoms of chest pain and left-sided chest wall and rib pain.  He was noted to have pneumonia and a left  pleural effusion.  He also has had generalized weakness. Sepsis secondary to nosocomial pneumonia that was present on admission- this is resolved now.  Blood cultures have been negative.  His pneumonia is being treated with cefepime for total of 7 days.  MRSA was negative and vancomycin was discontinued. Large loculated left pleural effusion-this was seen on CT of the chest.  Will ask radiology to evaluate for possible  thoracentesis and if possible we will send fluid for testing, cytology, Gram stain and other studies. Healthcare associated pneumonia-he is completing course of cefepime. Nursing reported dysphagia, BSE requested.    Assessment / Plan / Recommendation Clinical Impression  Clinical swallow evaluation completed, however limited due to Pt with poor engagement and cooperation. Pt difficulty to understand, however it was apparent that he was repeating, "Leave me alone". Oral care attempted, however Pt frequently clenched teeth together limiting oral care to posterior oral cavity. Pt presented with ice chips and small bite puree. Pt with again clenches teeth and shakes head. Two swallows elicited upon palpation and oral care again completed to remove oral residuals. Pt shows no interest in even pleasure/comfort feeds at this time and only became more agitated when offered/attempted. SLP will check back tomorrow to see if he is able to participate further, however in the meantime, recommend oral care and offering ice chips if Pt desires. Consider palliative consult to help establish goals of care. Above to RN, Lilia Pro. SLP will follow.    SLP Visit Diagnosis: Dysphagia, unspecified (R13.10)    Aspiration Risk  Moderate aspiration risk;Risk for inadequate nutrition/hydration;Severe aspiration risk    Diet Recommendation Ice chips PRN after oral care;NPO   Medication Administration: Via alternative means Postural Changes: Seated upright at 90 degrees    Other  Recommendations Oral Care Recommendations: Oral care prior to ice chip/H20;Staff/trained caregiver to provide oral care   Follow up Recommendations 24 hour supervision/assistance(palliative consult)      Frequency and Duration min 2x/week  1 week       Prognosis Prognosis for Safe Diet Advancement: Guarded Barriers to Reach Goals: Behavior;Severity of deficits;Motivation      Swallow Study   General Date of Onset: 01/06/2019 HPI:  78 year old gentleman with epilepsy, cerebrovascular disease status post CVA, recently presented with symptoms of chest pain and left-sided chest wall and rib pain.  He was noted to have pneumonia and a left pleural effusion.  He also has had generalized weakness. Sepsis secondary to nosocomial pneumonia that was present on admission- this is resolved now.  Blood cultures have been negative.  His pneumonia is being treated with cefepime for total of 7 days.  MRSA was negative and vancomycin was discontinued. Large loculated left pleural effusion-this was seen on CT of the chest.  Will ask radiology to evaluate for possible thoracentesis and if possible we will send fluid for testing, cytology, Gram stain and other studies. Healthcare associated pneumonia-he is completing course of cefepime. Nursing reported dysphagia, BSE requested.  Type of Study: Bedside Swallow Evaluation Previous Swallow Assessment: None on record Diet Prior to this Study: NPO Temperature Spikes Noted: No Respiratory Status: Nasal cannula History of Recent Intubation: No Behavior/Cognition: Requires cueing;Uncooperative Oral Cavity Assessment: Within Functional Limits Oral Care Completed by SLP: Yes Oral Cavity - Dentition: Adequate natural dentition Vision: Impaired for self-feeding Self-Feeding Abilities: Total assist Patient Positioning: Upright in bed Baseline Vocal Quality: Normal Volitional Cough: Cognitively unable to elicit Volitional Swallow: Able to elicit    Oral/Motor/Sensory Function Overall Oral Motor/Sensory Function: (unable to  fully assess due to non-compliance)   Ice Chips Ice chips: Impaired Presentation: Spoon Oral Phase Impairments: Reduced lingual movement/coordination Oral Phase Functional Implications: Oral holding;Left anterior spillage Pharyngeal Phase Impairments: Suspected delayed Swallow;Decreased hyoid-laryngeal movement;Multiple swallows   Thin Liquid Thin Liquid: (refused)    Nectar Thick  Nectar Thick Liquid: Not tested   Honey Thick Honey Thick Liquid: Not tested   Puree Puree: Impaired Presentation: Spoon Oral Phase Impairments: Reduced lingual movement/coordination;Poor awareness of bolus Oral Phase Functional Implications: Oral residue;Oral holding Pharyngeal Phase Impairments: Unable to trigger swallow(SLP removed with sponge)   Solid     Solid: Not tested     Thank you,  Genene Churn, [place]  Demika Langenderfer 12/29/2018,12:28 PM

## 2018-12-30 DIAGNOSIS — Z7189 Other specified counseling: Secondary | ICD-10-CM

## 2018-12-30 DIAGNOSIS — Z515 Encounter for palliative care: Secondary | ICD-10-CM

## 2018-12-30 DIAGNOSIS — K117 Disturbances of salivary secretion: Secondary | ICD-10-CM

## 2018-12-30 DIAGNOSIS — R0902 Hypoxemia: Secondary | ICD-10-CM

## 2018-12-30 LAB — CBC
HCT: 24.9 % — ABNORMAL LOW (ref 39.0–52.0)
Hemoglobin: 8.2 g/dL — ABNORMAL LOW (ref 13.0–17.0)
MCH: 28.1 pg (ref 26.0–34.0)
MCHC: 32.9 g/dL (ref 30.0–36.0)
MCV: 85.3 fL (ref 80.0–100.0)
Platelets: 175 10*3/uL (ref 150–400)
RBC: 2.92 MIL/uL — ABNORMAL LOW (ref 4.22–5.81)
RDW: 16.2 % — ABNORMAL HIGH (ref 11.5–15.5)
WBC: 18.5 10*3/uL — ABNORMAL HIGH (ref 4.0–10.5)
nRBC: 0 % (ref 0.0–0.2)

## 2018-12-30 LAB — RENAL FUNCTION PANEL
Albumin: 1.9 g/dL — ABNORMAL LOW (ref 3.5–5.0)
Anion gap: 10 (ref 5–15)
BUN: 14 mg/dL (ref 8–23)
CO2: 30 mmol/L (ref 22–32)
Calcium: 7.4 mg/dL — ABNORMAL LOW (ref 8.9–10.3)
Chloride: 97 mmol/L — ABNORMAL LOW (ref 98–111)
Creatinine, Ser: 0.37 mg/dL — ABNORMAL LOW (ref 0.61–1.24)
GFR calc Af Amer: >60 mL/min (ref >60)
GFR calc non Af Amer: >60 mL/min (ref >60)
Glucose, Bld: 104 mg/dL — ABNORMAL HIGH (ref 70–99)
Phosphorus: 3.8 mg/dL (ref 2.5–4.6)
Potassium: 3 mmol/L — ABNORMAL LOW (ref 3.5–5.1)
Sodium: 137 mmol/L (ref 135–145)

## 2018-12-30 LAB — GLUCOSE, CAPILLARY
Glucose-Capillary: 66 mg/dL — ABNORMAL LOW (ref 70–99)
Glucose-Capillary: 142 mg/dL — ABNORMAL HIGH (ref 70–99)
Glucose-Capillary: 110 mg/dL — ABNORMAL HIGH (ref 70–99)
Glucose-Capillary: 109 mg/dL — ABNORMAL HIGH (ref 70–99)
Glucose-Capillary: 92 mg/dL (ref 70–99)

## 2018-12-30 MED ORDER — SCOPOLAMINE 1 MG/3DAYS TD PT72
1.0000 | MEDICATED_PATCH | TRANSDERMAL | Status: DC
  Administered 2018-12-30: 1.5 mg via TRANSDERMAL
  Filled 2018-12-30: qty 1

## 2018-12-30 MED ORDER — DEXTROSE 50 % IV SOLN
1.0000 | Freq: Once | INTRAVENOUS | Status: AC
  Administered 2018-12-30: 50 mL via INTRAVENOUS

## 2018-12-30 MED ORDER — GLYCOPYRROLATE 0.2 MG/ML IJ SOLN
0.2000 mg | INTRAMUSCULAR | Status: DC | PRN
  Administered 2018-12-30 (×2): 0.2 mg via INTRAVENOUS
  Filled 2018-12-30 (×2): qty 1

## 2018-12-30 MED ORDER — LEVETIRACETAM IN NACL 500 MG/100ML IV SOLN
500.0000 mg | Freq: Two times a day (BID) | INTRAVENOUS | Status: DC
  Administered 2018-12-30 – 2018-12-31 (×2): 500 mg via INTRAVENOUS
  Filled 2018-12-30 (×2): qty 100

## 2018-12-30 MED ORDER — BISACODYL 10 MG RE SUPP: 10.0000 mg | Freq: Every day | RECTAL | Status: DC | PRN

## 2018-12-30 MED ORDER — DEXTROSE 50 % IV SOLN
INTRAVENOUS | Status: AC
  Filled 2018-12-30: qty 50

## 2018-12-30 MED ORDER — MORPHINE SULFATE (PF) 2 MG/ML IV SOLN
1.0000 mg | INTRAVENOUS | Status: DC | PRN
  Administered 2018-12-30: 2 mg via INTRAVENOUS
  Filled 2018-12-30: qty 1

## 2018-12-30 NOTE — Progress Notes (Addendum)
PROGRESS NOTE  Joshua Mckee  TOI:[id number]  DOB: July 07, 1941  DOA: 12/30/2018 PCP: Dettinger, Fransisca Kaufmann, MD  Brief Admission Hx: 78 year old gentleman with epilepsies, cerebrovascular disease recently hospitalized and discharged from SNF presented with new symptoms of left-sided chest wall pain, rib pain and noted to have a large left pleural effusion, healthcare associated pneumonia and generalized weakness.  MDM/Assessment & Plan:   1. Large loculated left pleural effusion-this was seen on CT of the chest.  I spoke with radiology and due to limited staffing associated with COVID-19 pandemic they are unable to do a thoracentesis until 01-10-2019.  If he is able to have this done we had plan to send fluid for cytology testing Gram stain and other studies.  The radiologist said that because the effusion is loculated there is no guarantee that the procedure will relieve a meaningfully effective resolution in the effusion.  I have asked for palliative medicine consultation for goals of care and explained to the patient's family that his prognosis is very guarded.  He is progressively getting worse.  They verbalized understanding and are agreeable to meeting with the palliative medicine team for goals of care discussions. 2. Sepsis secondary to nosocomial pneumonia that was present on admission- this was treated and mostly resolved however his blood cultures have been negative.  He has been treated with 7 days of cefepime.  Vancomycin has been discontinued. 3. Epilepsy-he has had no recent seizure activity.  He has been maintained on Keppra.  If he remains n.p.o. we will change to IV. 4. Dysphagia- the bedside nurse noticed patient having difficulty with swallowing.  Speech therapy has evaluated him and he is currently n.p.o.  Pending further work-up. 5. Hypoglycemia - He was given dextrose with improvement, will monitor.   6. Rhabdomyolysis this has been treated. 7. Hypokalemia- IV replacement and  magnesium replacement. 8. Hypoalbuminemia- he has received IV albumin with only minimal improvement.  He is not eating well.  Goals of care discussions with the palliative medicine team have been requested.  Unfortunately the patient's prognosis remains very guarded and he appears to be at end-of-life.  We will have more discussions with family today. 9. Volume overload- treated with IV diuresis. 10. Anasarca-secondary to poor oral intake, hypoalbuminemia and fluids given for resuscitation and treatment of rhabdomyolysis.  He has had some improvement with this but only slight improvement. 11. Generalized weakness and possibility of NPH- appreciate neurology consultation.  Please see neurologic consult notes.  DVT prophylaxis: TED hose Code Status: DNR Family Communication: I spoke with patient's common-law wife Joshua Mckee who agrees with full comfort care.   Disposition Plan: anticipate hospital death  Consultants:  Palliative medicine  Neurology  Procedures:    Antimicrobials:  Cefepime 01/15/2019 >12/30/18  Vancomycin 4/7-4/8   Subjective: Patient is a little more awake today however he remains very ill and difficult to understand his speech  Objective: Vitals:   12/29/18 2154 12/29/18 2210 12/30/18 0530 12/30/18 0606  BP: 110/85  (!) 117/91   Pulse: (!) 105  (!) 104   Resp: (!) 25  (!) 32   Temp:    (!) 93.8 F (34.3 C)  TempSrc:    Rectal  SpO2: (!) 78% 95% 95%   Weight:      Height:        Intake/Output Summary (Last 24 hours) at 12/30/2018 0943 Last data filed at 12/30/2018 0530 Gross per 24 hour  Intake 600.44 ml  Output 3450 ml  Net -2849.56 ml   Danley Danker  Weights   12/27/18 0500 12/28/18 0549 12/29/18 0400  Weight: 83.1 kg 84.4 kg 81 kg     REVIEW OF SYSTEMS  As per history otherwise all reviewed and reported negative  Exam:  General exam: Frail, elderly, chronically ill-appearing male, lying in bed.  He is mostly dysarthric with raspy voice. Respiratory  system: Diminished breath sounds on the left.  Mild increased work of breathing. Cardiovascular system: Tachycardic S1 & S2 heard.  Gastrointestinal system: Abdomen is nondistended, soft and nontender. Normal bowel sounds heard. Central nervous system:  No focal neurological deficits. Extremities: 2+ pitting edema bilateral lower extremities and upper extremities.  Data Reviewed: Basic Metabolic Panel: Recent Labs  Lab 12/24/18 0411  12/25/18 0548 12/26/18 0539 12/27/18 0453 12/28/18 0751 12/29/18 0433  NA 132*   < > 134* 133* 132* 135 136  K 2.1*   < > 3.5 2.9* 3.1* 3.8 3.1*  CL 96*   < > 98 98 99 100 100  CO2 29   < > 29 26 27 27 28   GLUCOSE 73   < > 68* 69* 104* 92 72  BUN 15   < > 10 10 11 12 13   CREATININE 0.36*   < > 0.33* 0.36* <0.30* <0.30* 0.34*  CALCIUM 6.7*   < > 7.2* 7.0* 6.9* 7.2* 7.3*  MG 1.2*  --  1.9  --  1.9 2.3 1.9  PHOS  --   --   --   --   --  3.2 3.4   < > = values in this interval not displayed.   Liver Function Tests: Recent Labs  Lab 12/24/18 0411 12/25/18 0548 12/26/18 0539 12/27/18 0453 12/28/18 0751 12/29/18 0433  AST 66* 53* 61* 61*  --   --   ALT 95* 82* 92* 96*  --   --   ALKPHOS 85 94 103 106  --   --   BILITOT 1.0 1.2 1.0 0.9  --   --   PROT 4.2* 4.4* 4.5* 4.5*  --   --   ALBUMIN 2.0* 2.3* 1.9* 1.8* 1.8* 2.0*   No results for input(s): LIPASE, AMYLASE in the last 168 hours. No results for input(s): AMMONIA in the last 168 hours. CBC: Recent Labs  Lab 12/24/18 1914 12/25/18 0548 12/26/18 0539 12/27/18 0453 12/29/18 0433  WBC 19.2* 18.5* 15.5* 15.8* 15.2*  NEUTROABS  --   --   --  12.9* 13.0*  HGB 7.6* 7.7* 8.1* 7.7* 7.4*  HCT 23.2* 23.9* 24.3* 24.0* 22.7*  MCV 86.2 85.1 85.3 86.6 85.7  PLT 154 160 160 148* 169   Cardiac Enzymes: Recent Labs  Lab 12/25/18 0548  CKTOTAL 389   CBG (last 3)  Recent Labs    12/30/18 0620 12/30/18 0645 12/30/18 0741  GLUCAP 66* 142* 110*   Recent Results (from the past 240 hour(s))   Culture, blood (single)     Status: None   Collection Time: 01/08/2019  8:33 AM  Result Value Ref Range Status   Specimen Description BLOOD LEFT ARM  Final   Special Requests   Final    BOTTLES DRAWN AEROBIC AND ANAEROBIC Blood Culture adequate volume   Culture   Final    NO GROWTH 5 DAYS Performed at [facility], [address], [place], [place] [address]    Report Status 12/28/2018 FINAL  Final  Culture, blood (single)     Status: None   Collection Time: 12/22/2018  9:07 AM  Result Value Ref Range Status  Specimen Description RIGHT ANTECUBITAL  Final   Special Requests   Final    BOTTLES DRAWN AEROBIC AND ANAEROBIC Blood Culture adequate volume   Culture   Final    NO GROWTH 5 DAYS Performed at [facility], [address], [place], [place] [address]    Report Status 12/28/2018 FINAL  Final  MRSA PCR Screening     Status: None   Collection Time: 12/29/2018 11:51 AM  Result Value Ref Range Status   MRSA by PCR NEGATIVE NEGATIVE Final    Comment:        The GeneXpert MRSA Assay (FDA approved for NASAL specimens only), is one component of a comprehensive MRSA colonization surveillance program. It is not intended to diagnose MRSA infection nor to guide or monitor treatment for MRSA infections. Performed at [facility], [address] [place], [place] [address]    Studies: Dg Chest [facility] 1 View  Result Date: 12/29/2018 CLINICAL DATA:  78 y/o M; increasing shortness of breath. Pneumonia. EXAM: PORTABLE CHEST 1 VIEW COMPARISON:  12/26/2018 CT chest. 01/06/2019 chest radiograph. FINDINGS: Increased left-sided pleural effusion from CT chest and prior chest radiograph with near complete consolidation of the left lung. Diffuse hazy opacification of the right lung and right pleural effusion are also increased. Right PICC line tip projects over lower SVC. Cardiac silhouette is largely obscured by the left-sided pleural effusion. Bones are unremarkable. No appreciable pneumothorax.  IMPRESSION: Increased left-sided pleural effusion and near complete consolidation of left lung. Worsening diffuse hazy opacification of right lung and right pleural effusion. Electronically Signed   By: Kristine Garbe M.D.   On: 12/29/2018 23:01   Scheduled Meds: . aspirin  325 mg Oral Q breakfast  . Chlorhexidine Gluconate Cloth  6 each Topical Q0600  . Chlorhexidine Gluconate Cloth  6 each Topical Daily  . feeding supplement (ENSURE ENLIVE)  237 mL Oral BID BM  . feeding supplement (PRO-STAT SUGAR FREE 64)  30 mL Oral BID  . heparin  5,000 Units Subcutaneous Q8H  . levETIRAcetam  500 mg Oral BID  . mouth rinse  15 mL Mouth Rinse BID  . midodrine  5 mg Oral TID WC  . pantoprazole  40 mg Oral Daily  . sodium chloride flush  10-40 mL Intracatheter Q12H  . tamsulosin  0.4 mg Oral Daily  . thiamine  100 mg Oral Daily   Continuous Infusions: . sodium chloride 250 mL (12/28/18 1732)  . ceFEPime (MAXIPIME) IV 2 g (12/30/18 0305)   Principal Problem:   Sepsis ([place]) Active Problems:   Reflux esophagitis   History of CVA (cerebrovascular accident)   Weakness   Rhabdomyolysis   PNA (pneumonia)   Seizure disorder (HCC)   Hypoalbuminemia   Anasarca   Pleural effusion on left   Hypotension  Time spent:   Irwin Brakeman, MD [facility] 12/30/2018, 9:43 AM    LOS: 7 days  How to contact the [facility] Attending or Consulting provider [place] or covering provider during after hours [place], for this patient?  1. Check the care team in [facility] and look for a) attending/consulting TR[facility] provider listed and b) the [facility] team listed 2. Log into [email or web address] and use [facility]'s universal password to access. If you do not have the password, please contact the hospital operator. 3. Locate the [facility] provider you are looking for under [facility] and page to a number that you can be directly reached. 4. If you still have difficulty reaching the provider, please  page the [facility] (Director  on Call) for the Hospitalists listed on amion for assistance.

## 2018-12-30 NOTE — Progress Notes (Signed)
Nurse tech was unable to obtain patients temperature orally or axillary. Obtained patients temperature rectally temperature reading was 93.8. MD made aware. Md called nurse and said to check blood sugar and place a warm blanket on patients. Will continue to monitor throughout shift.

## 2018-12-30 NOTE — Progress Notes (Signed)
PT Cancellation Note  Patient Details Name: Dayle Mcnerney MRN: [id number] DOB: 20-May-1941   Cancelled Treatment:    Reason Eval/Treat Not Completed: Other (comment) Following discussion with RN attempted bed mobility and PROM/AAROM exercises.  Pt very hard to understand, gargling with fluid in mouth.  Offered spit cup and attempted to assist with suction but pt refuses to open mouth and pushes away with UE.  RN aware of status.  Pt left in bed with call bell within reach.    [address], LPTA; [place] 12/30/2018, 12:14 PM
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
## 2018-12-30 NOTE — Progress Notes (Signed)
Patients CBG reading was 66. Paged MD patient is currently NPO. No new orders at this time.

## 2018-12-30 NOTE — Progress Notes (Signed)
MD placed order to push 1 amp of D50. Rechecked CBG reading was 142. Rechecked temperature rectally. Reading was 93.9. called MD. No new orders at this time. Will continue to monitor throughout shift.

## 2019-01-16 NOTE — Progress Notes (Signed)
Patient with no respiration or heart beat pronounced at 0603 by Elray Mcgregor and [place]  By two RN. MD notified. [place] donor services notified.family notified. Post mortem care provided. Patient placed in [place].

## 2019-01-16 NOTE — Discharge Summary (Signed)
Expiration Note  Joshua Mckee  MR#: [id number]  DOB:09/28/1940  Date of Admission: January 09, 2019 Date of Death: 17-Jan-2019 1194  Attending [place]  Patient's PCP: Dettinger, Fransisca Kaufmann, MD  Consults: Treatment Team:  Joshua Odor, MD  Cause of Death: Sepsis [facility])  Secondary Diagnoses Present on Admission: . Rhabdomyolysis . Reflux esophagitis . Hypoalbuminemia . Anasarca . Pleural effusion on left . Sepsis ([place]) . Hypotension   Hospital Course: Brief Admission Hx: 78 year old gentleman with epilepsies, cerebrovascular disease recently hospitalized and discharged from SNF presented with new symptoms of left-sided chest wall pain, rib pain and noted to have a large left pleural effusion, healthcare associated pneumonia and generalized weakness.  MDM/Assessment & Plan:   1. Large loculated left pleural effusion-this was seen on CT of the chest.  I spoke with radiology and due to limited staffing associated with COVID-19 pandemic they are unable to do a thoracentesis until 01-17-2019.  If he is able to have this done we had plan to send fluid for cytology testing Gram stain and other studies.  The radiologist said that because the effusion is loculated there is no guarantee that the procedure will relieve a meaningfully effective resolution in the effusion.  I have asked for palliative medicine consultation for goals of care and explained to the patient's family that his prognosis is very guarded.  He is progressively getting worse.  They verbalized understanding and are agreeable to meeting with the palliative medicine team for goals of care discussions. 2. Sepsis secondary to nosocomial pneumonia that was present on admission- this was treated and mostly resolved however his blood cultures have been negative.  He has been treated with 7 days of cefepime.  Vancomycin has been discontinued. 3. Epilepsy-he has had no recent seizure activity.  He has been maintained on  Keppra.  If he remains n.p.o. we will change to IV. 4. Dysphagia- the bedside nurse noticed patient having difficulty with swallowing.  Speech therapy has evaluated him and he is currently n.p.o.  Pending further work-up. 5. Hypoglycemia - He was given dextrose with improvement, will monitor.   6. Rhabdomyolysis this has been treated. 7. Hypokalemia- IV replacement and magnesium replacement. 8. Hypoalbuminemia- he has received IV albumin with only minimal improvement.  He is not eating well.  Goals of care discussions with the palliative medicine team have been requested.  Unfortunately the patient's prognosis remains very guarded and he appears to be at end-of-life.  We will have more discussions with family today. 9. Volume overload- treated with IV diuresis. 10. Anasarca-secondary to poor oral intake, hypoalbuminemia and fluids given for resuscitation and treatment of rhabdomyolysis.  He has had some improvement with this but only slight improvement. 11. Generalized weakness and possibility of NPH- appreciate neurology consultation.  Please see neurologic consult notes.  DVT prophylaxis: TED hose Code Status: DNR Family Communication: I spoke with patient's common-law wife Joshua Mckee who agrees with full comfort care.   Disposition Plan:  hospital death  Consultants:  Palliative medicine  Neurology  Signed: Irwin Mckee 01/05/2019, 7:35 AM  How to contact the [facility] Attending or Consulting provider [place] or covering provider during after hours [place], for this patient?  1. Check the care team in [facility] and look for a) attending/consulting [facility] provider listed and b) the [facility] team listed 2. Log into [email or web address] and use [place]'s universal password to access. If you do not have the password, please contact the hospital operator. 3. Locate the [facility] provider you are looking for  under [facility] and page to a number that you can be directly reached. 4. If you still have difficulty  reaching the provider, please page the [facility] (Director on Call) for the Hospitalists listed on amion for assistance.

## 2019-01-16 NOTE — Progress Notes (Signed)
Patient belongings released to next of kin, daughter Coleston Dirosa).   Celestia Khat, RN

## 2019-01-16 DEATH — deceased

## 2019-10-05 IMAGING — DX DG HIP (WITH OR WITHOUT PELVIS) 2-3V*L*
3 series · 3 of 3 positions shown · non-contrast
Comparison: 09/15/2018 CT

CLINICAL DATA: Left hip pain. Recent surgery for small bowel
obstruction.

EXAM:
DG HIP (WITH OR WITHOUT PELVIS) 2-3V LEFT

[pelvis ap]
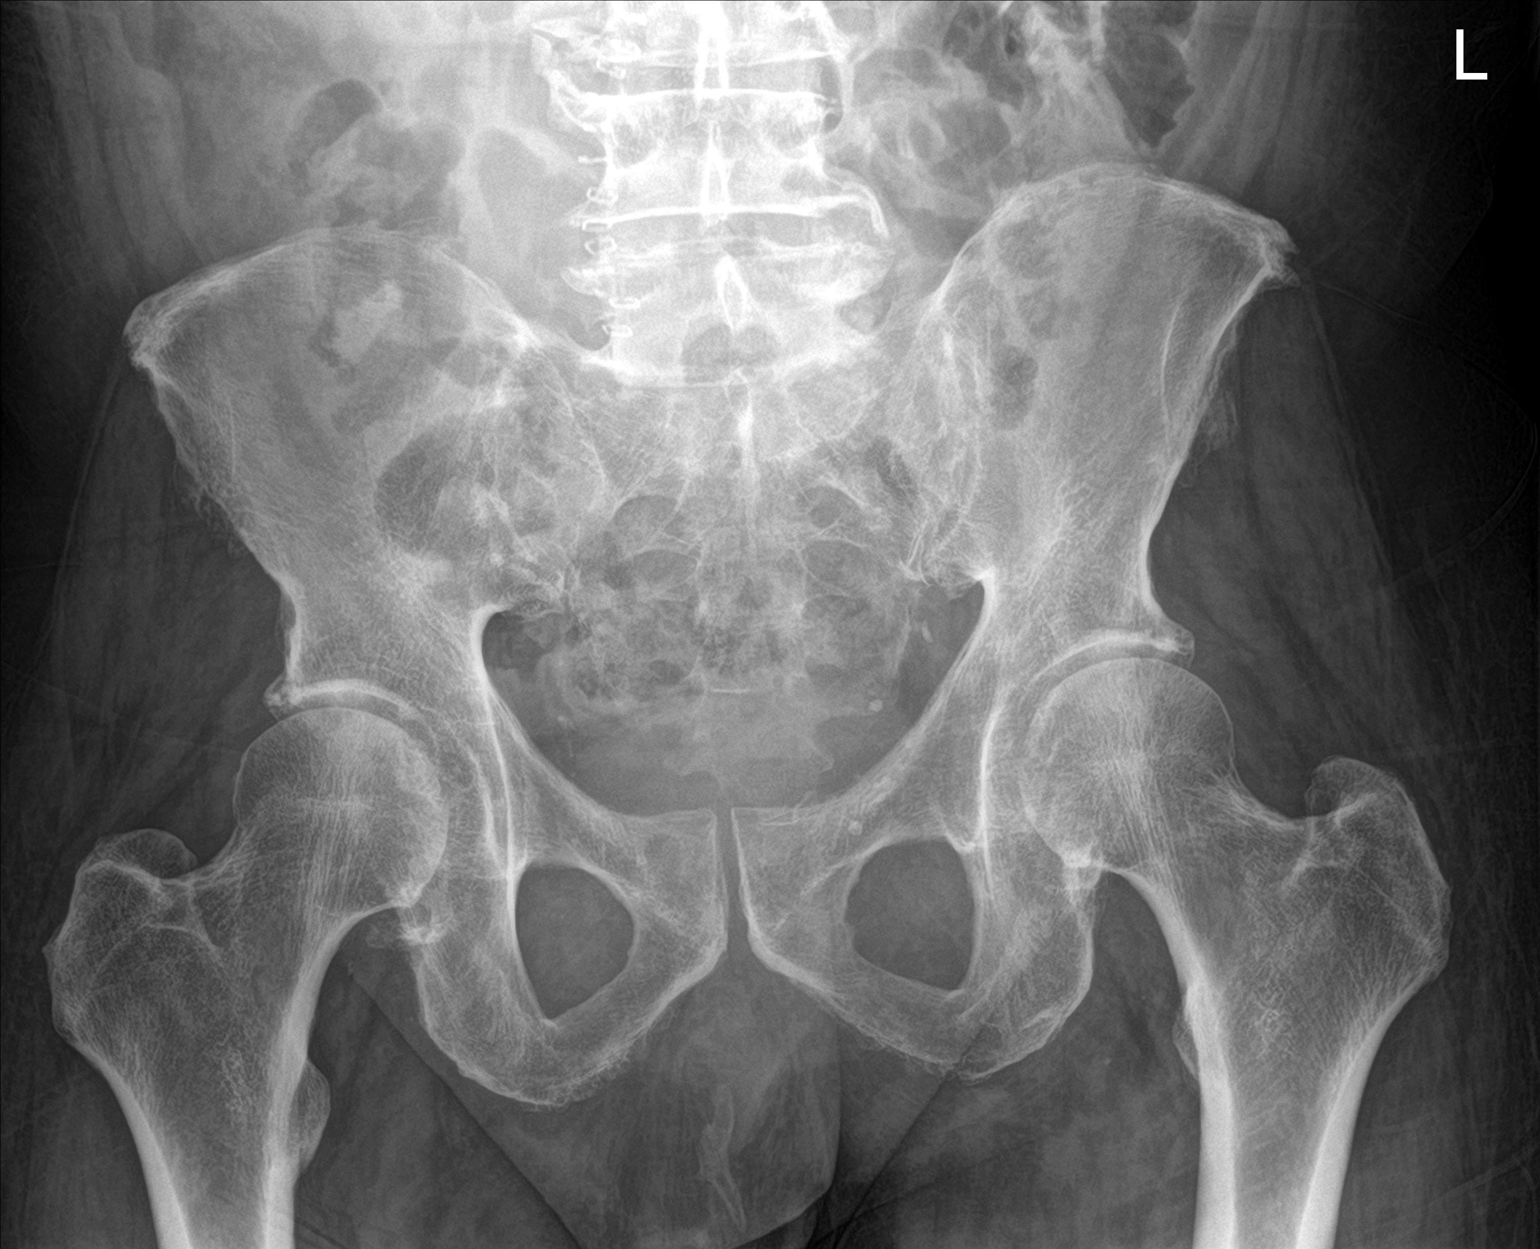

[hip ap]
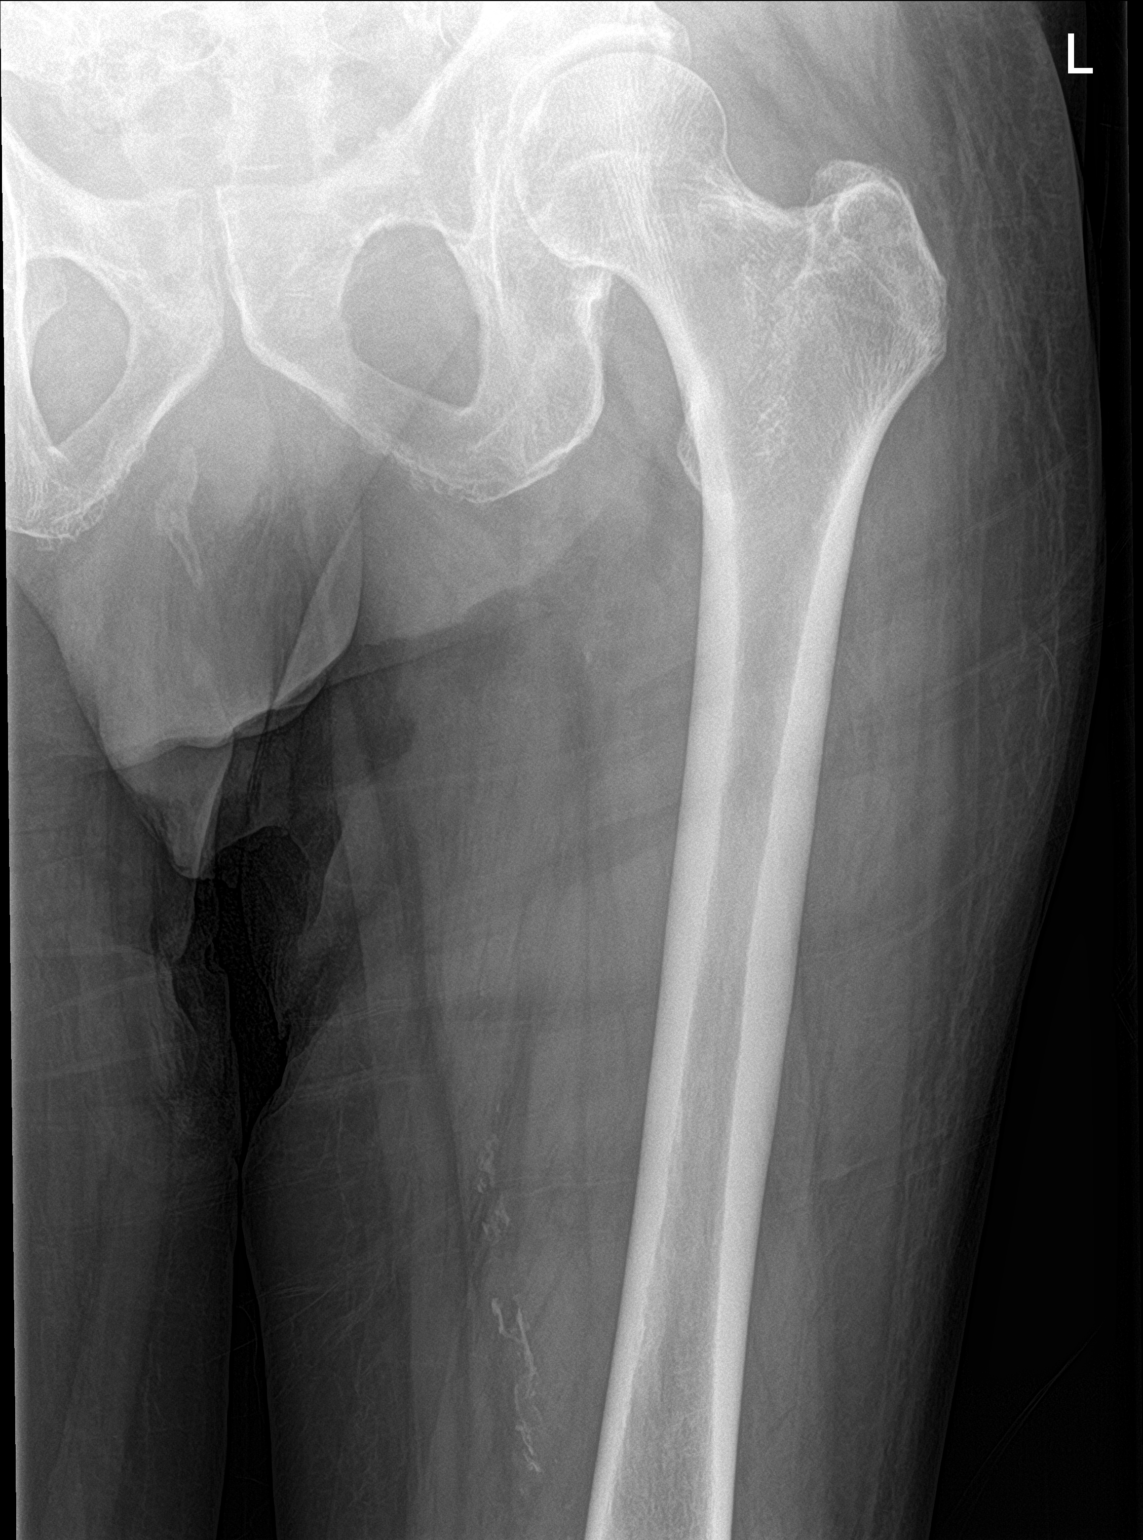

[hip lat]
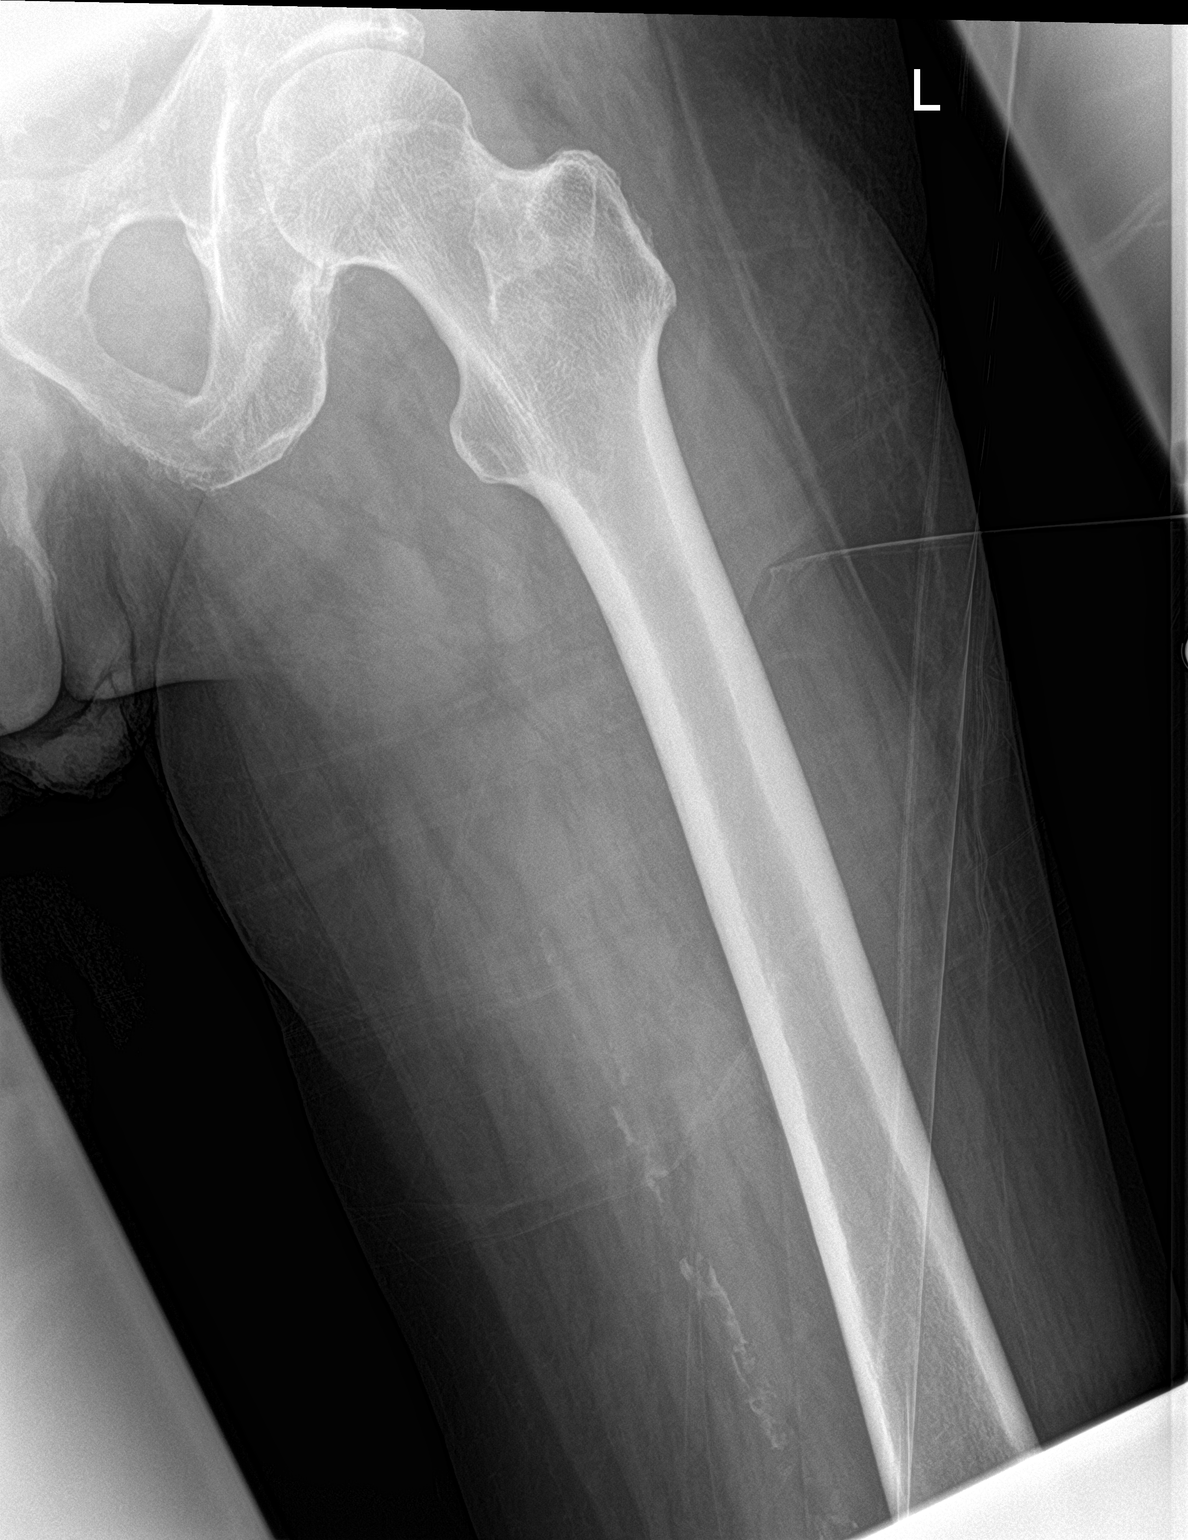

[3 of 3 positions shown; findings below may reference images not displayed]

FINDINGS: Parasagittal skin staples from recent abdominal surgery are
identified in the right lower quadrant. Degenerative disc disease
with osteophytes of the included lower lumbar spine from L3 through
S1 associated facet arthropathy is seen. Pelvis appears intact. Hip
joints are maintained. There are vascular calcifications noted along
course of the left common femoral and femoral arteries. Penile
vascular calcifications are redemonstrated.
IMPRESSION: 1. No acute osseous abnormality of the pelvis and left hip.
2. Lower lumbar spondylosis.

## 2020-01-05 IMAGING — CT CT ABDOMEN AND PELVIS WITHOUT CONTRAST
2 of 4 series · 16 of 46 positions shown, 18 images · non-contrast
Comparison: 11/12/2018

CLINICAL DATA: Left upper quadrant abdominal pain. Multiple recent
falls. Generalized weakness.

EXAM:
CT ABDOMEN AND PELVIS WITHOUT CONTRAST
TECHNIQUE: Multidetector CT imaging of the abdomen and pelvis was performed
following the standard protocol without IV contrast.

[Series 3: axial st · axial · 0.83mm/px · z∈[+946,+1381]mm · 13 of 95 slices shown, 15 images]
[im 4/95  soft-tissue]
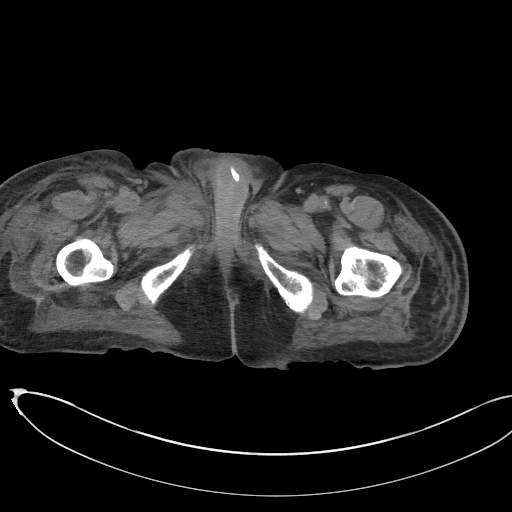
[im 4/95  bone]
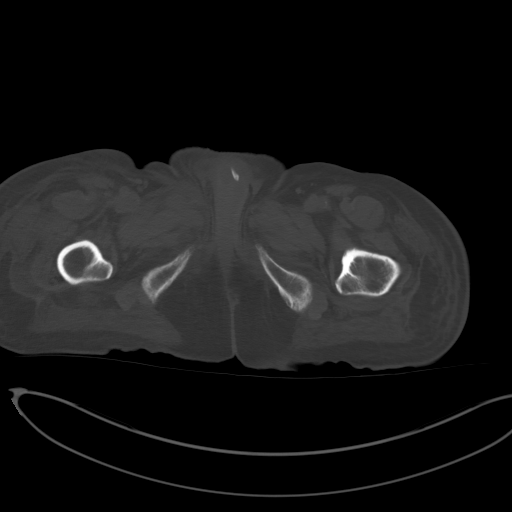
[im 12/95  soft-tissue]
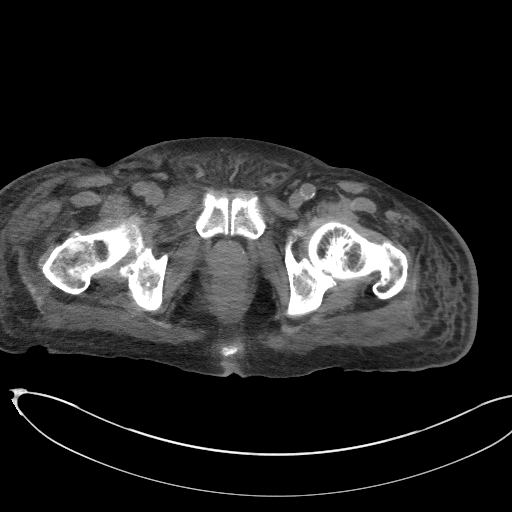
[im 19/95  soft-tissue]
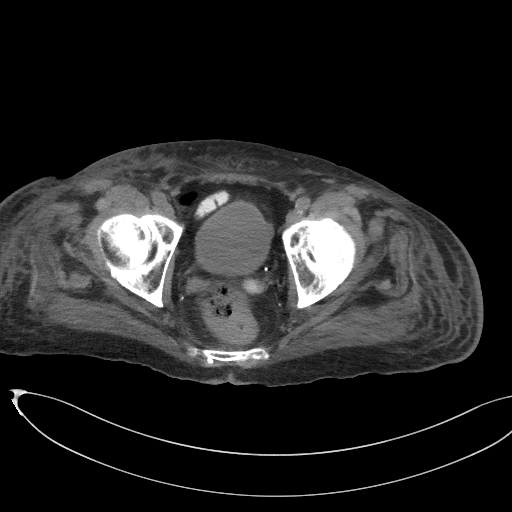
[im 27/95  soft-tissue]
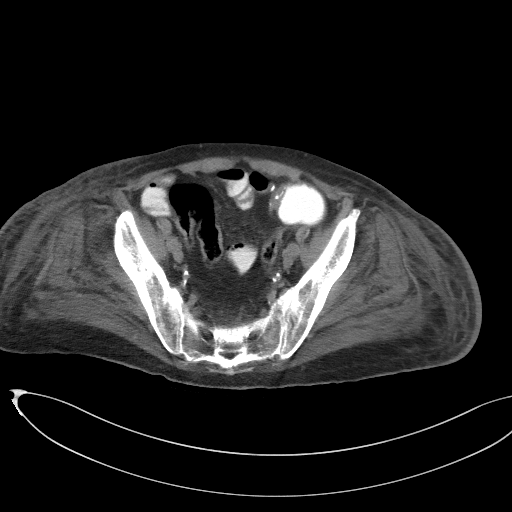
[im 34/95  soft-tissue]
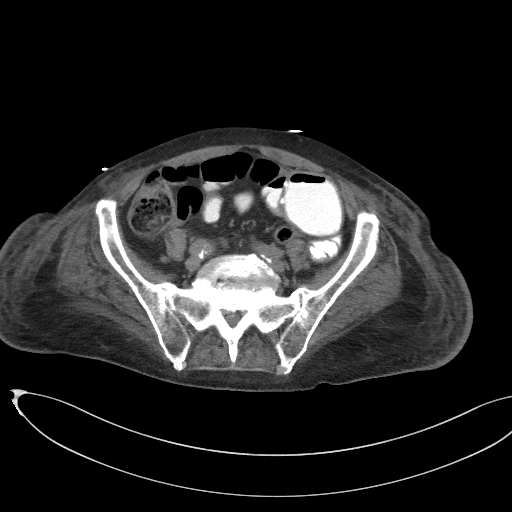
[im 42/95  soft-tissue]
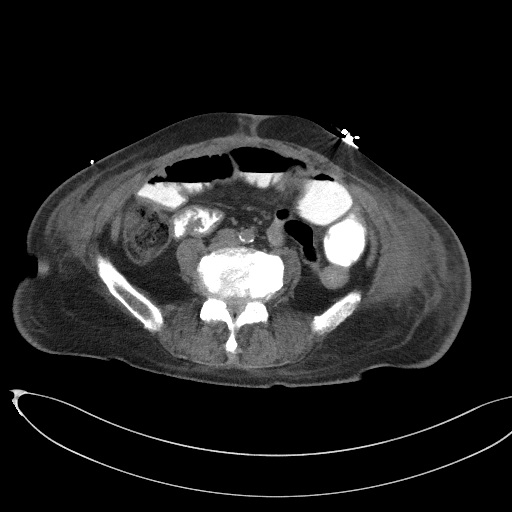
[im 49/95  soft-tissue]
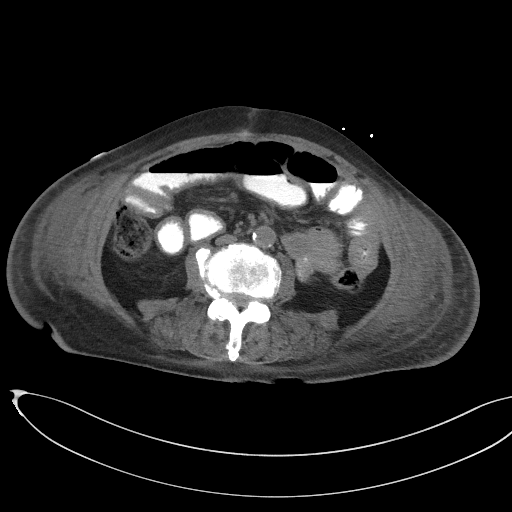
[im 53/95  soft-tissue]
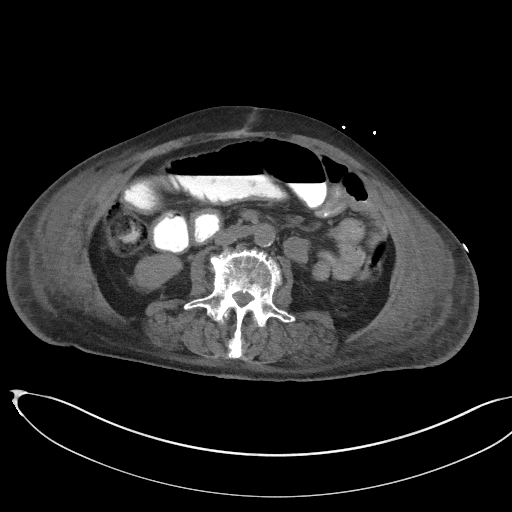
[im 61/95  soft-tissue]
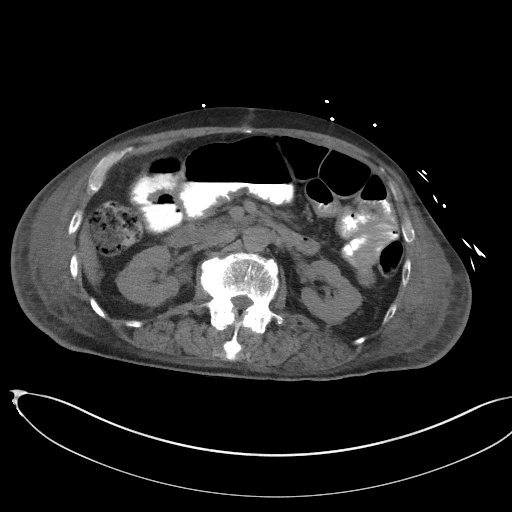
[im 61/95  bone]
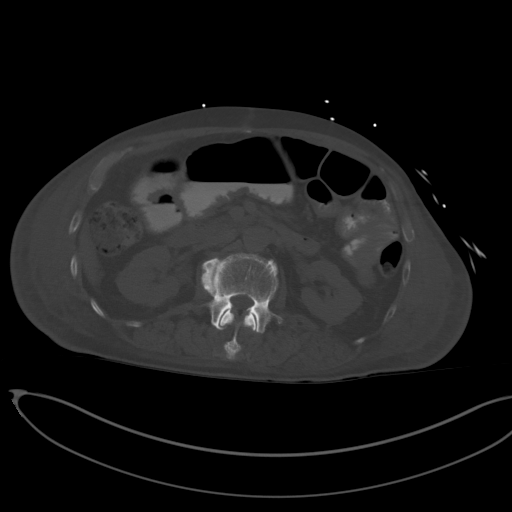
[im 68/95  soft-tissue]
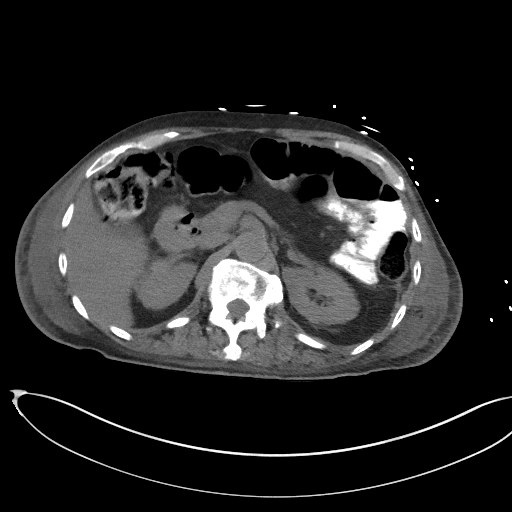
[im 76/95  soft-tissue]
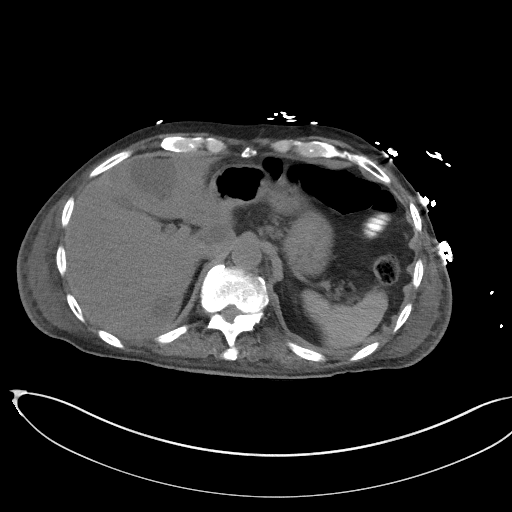
[im 83/95  soft-tissue]
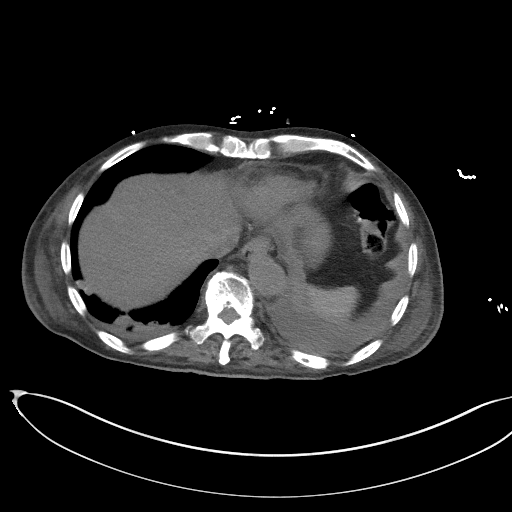
[im 91/95  soft-tissue]
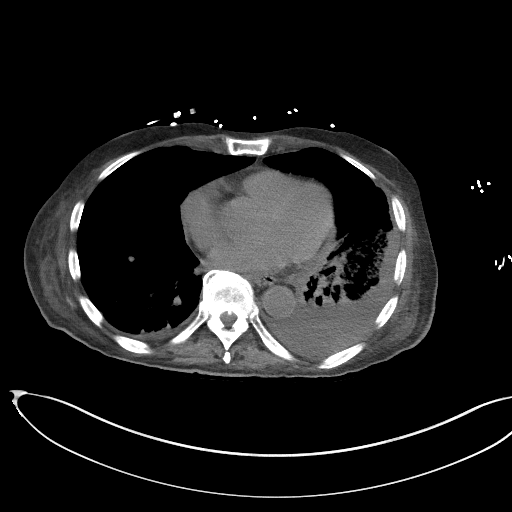

[Series 6: coronal st · coronal · 0.92mm/px · 3 of 84 slices shown]
[im 28/84  soft-tissue]
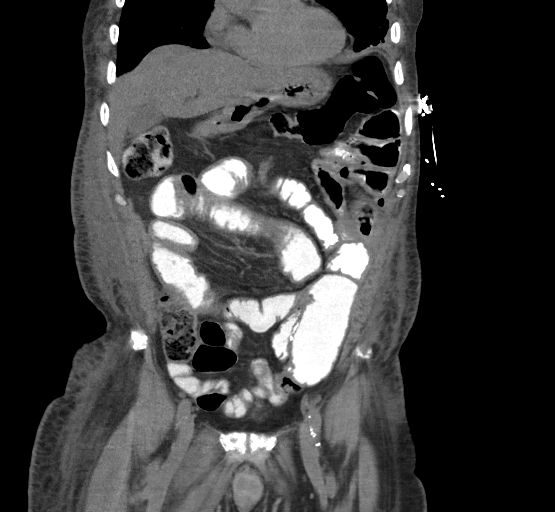
[im 37/84  soft-tissue]
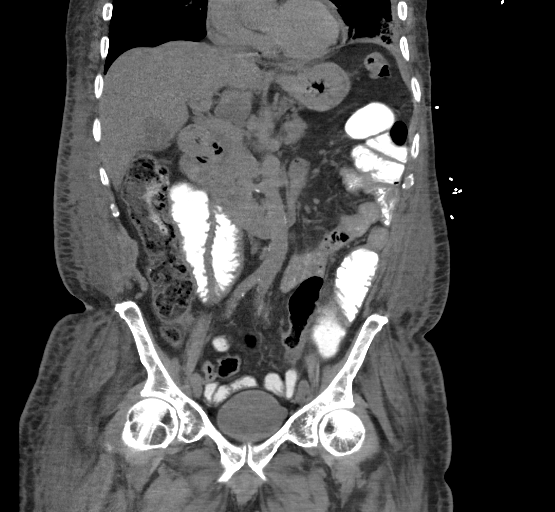
[im 47/84  soft-tissue]
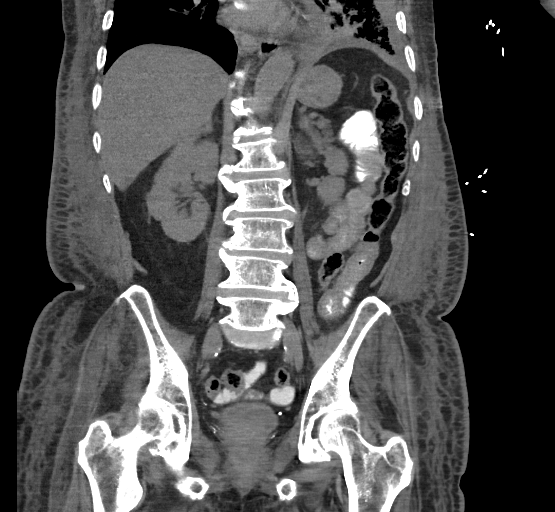

[16 of 46 positions shown; findings below may reference images not displayed]

FINDINGS: Lower chest: New small to moderate volume left pleural effusion with
associated left basilar airspace density suspicious for pneumonia.
There also may be mild infiltrate at the right lung base with a
trace amount of right pleural fluid.

Hepatobiliary: Stable cysts scattered throughout the liver
parenchyma. The gallbladder appears unremarkable. No biliary
dilatation.

Pancreas: Unremarkable. No pancreatic ductal dilatation or
surrounding inflammatory changes.

Spleen: Normal in size without focal abnormality. No evidence of
perisplenic hemorrhage.

Adrenals/Urinary Tract: Adrenal glands are unremarkable. Kidneys are
normal, without renal calculi, focal lesion, or hydronephrosis.
Stable cyst of the lower pole of the left kidney. Bladder is
unremarkable.

Stomach/Bowel: There is less prominent dilatation of the small bowel
compared to the prior study. There are some remaining dilated small
bowel loops as well as at least one loop of thickened small bowel in
the left mid to lower abdomen. No evidence of free air.

Vascular/Lymphatic: Stable aneurysmal dilatation of the right common
iliac artery measuring up to approximately 2.2 cm. No enlarged
abdominal or pelvic lymph nodes.

Reproductive: Prostate is unremarkable.

Other: No abdominal wall hernia or abnormality. No focal abscess or
ascites.

Musculoskeletal: No fractures identified. Mild degenerative disc
disease is seen throughout the spine.
IMPRESSION: 1. New small to moderate left pleural effusion with associated left
lower lobe airspace disease suspicious for pneumonia. There may also
be a mild infiltrate at the right lung base.
2. Most prominent small bowel dilatation compared to the prior
study. There remains some dilated small bowel loops with at least a
single thickened loop of small bowel in the left mid to lower
abdomen. No evidence of free air or focal abscess.
3. Stable aneurysmal dilatation of the right common iliac artery.

## 2020-01-05 IMAGING — DX CHEST - 2 VIEW
2 series · 2 of 2 positions shown · non-contrast
Comparison: 11/12/2018

CLINICAL DATA: Fall with difficulty walking.  Left rib pain.

EXAM:
CHEST - 2 VIEW

[chest lat]
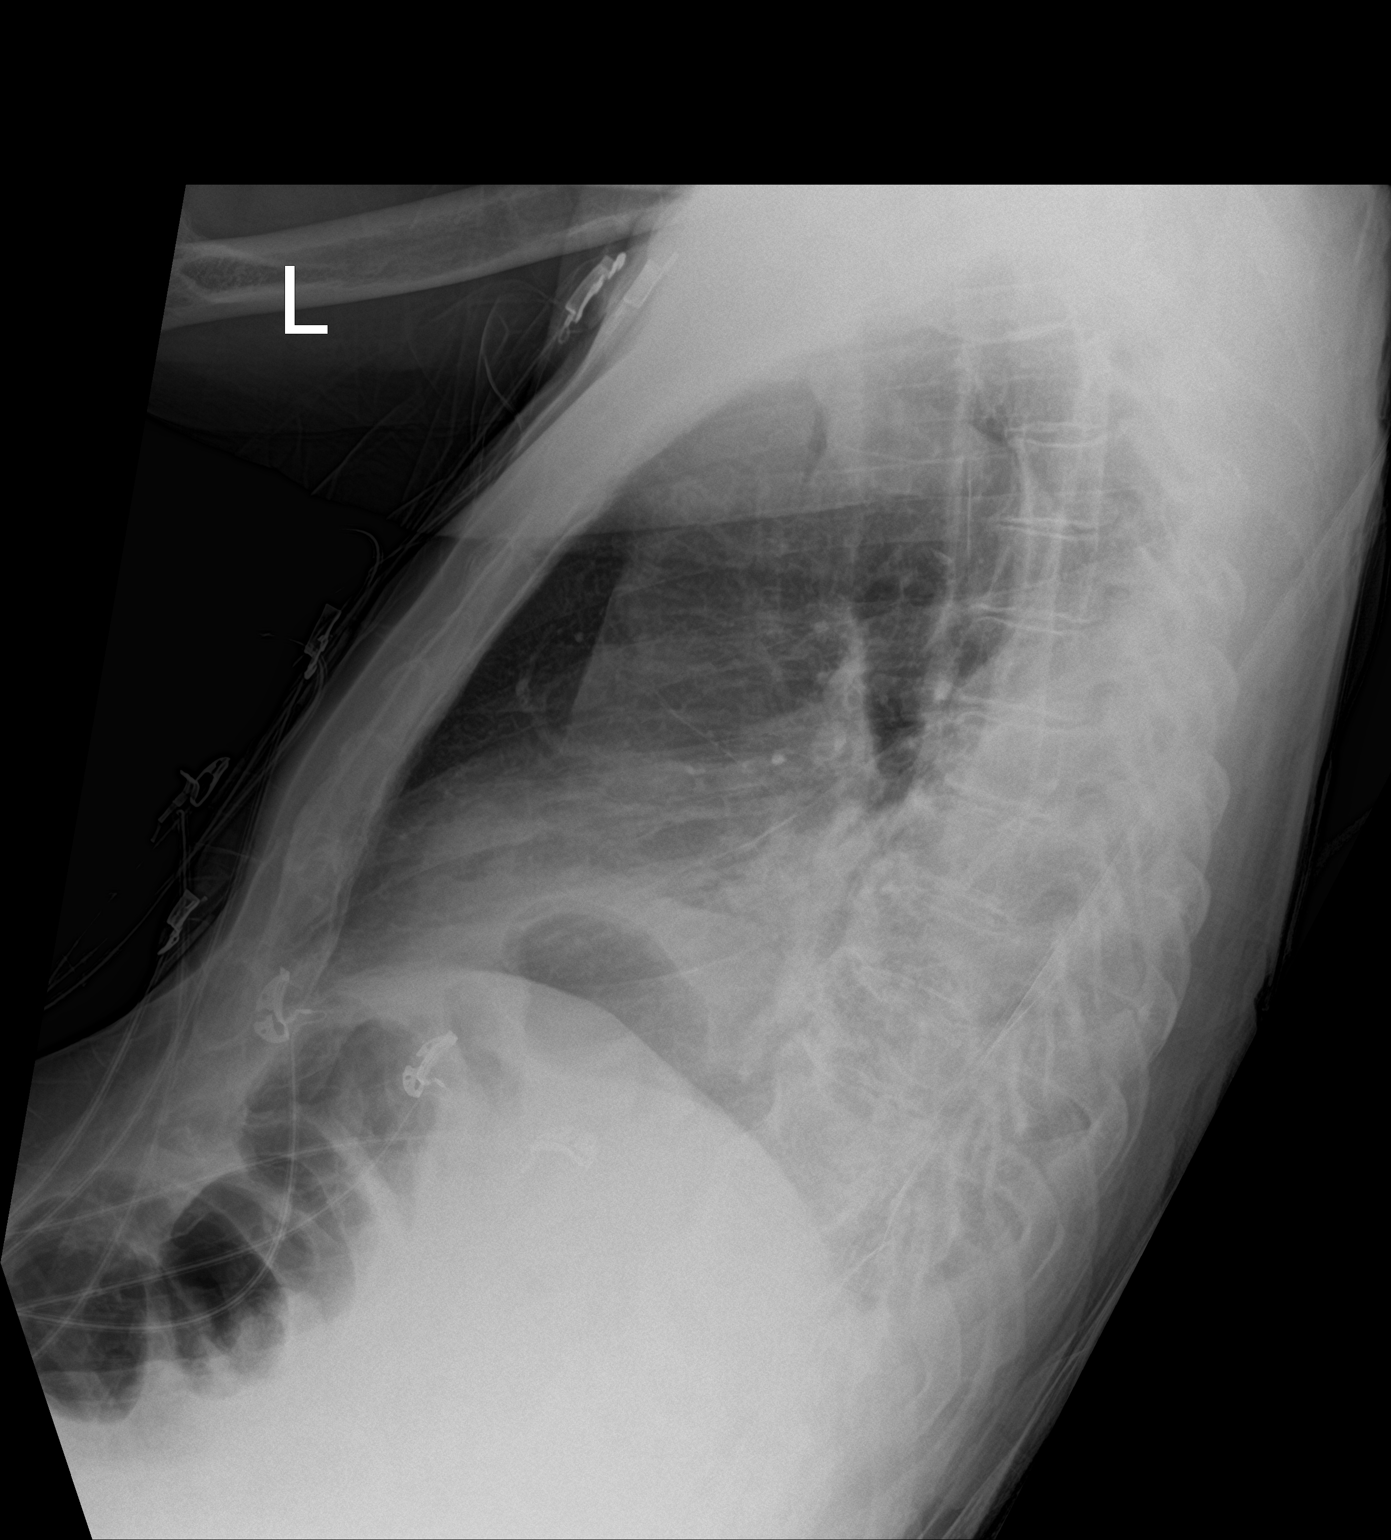

[chest ap]
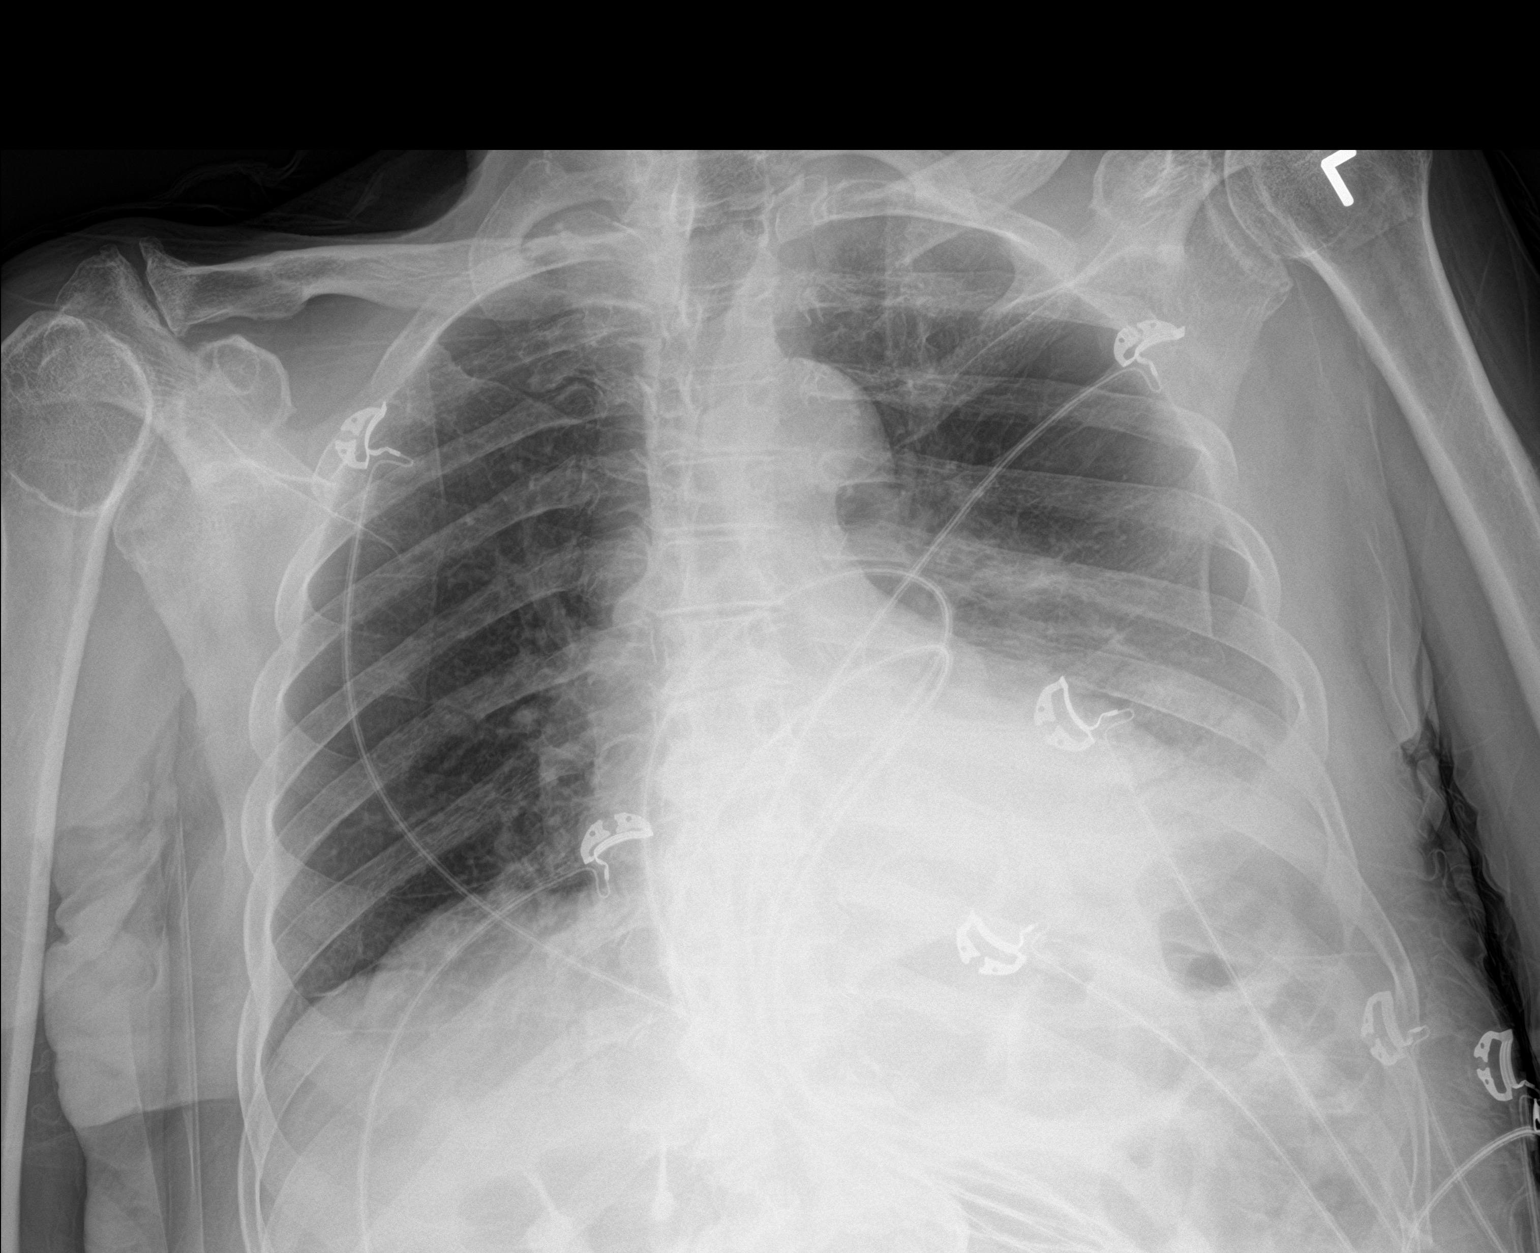

[2 of 2 positions shown; findings below may reference images not displayed]

FINDINGS: Lungs are hypoinflated with opacification over the left base
suggesting layering moderate effusion with associated basilar
atelectasis. Infection in the left base is possible. Right lung is
clear. Mild stable cardiomegaly. No acute left rib fracture.
Remainder of the exam is unchanged.
IMPRESSION: Opacification over the left mid to lower lung likely moderate size
layering effusion with associated basilar atelectasis. Infection in
the left mid to lower lung is possible.

No acute fracture.
